# Patient Record
Sex: Female | Born: 1935 | Race: White | Hispanic: No | Smoking: Never smoker
Health system: Southern US, Community
[De-identification: ages and names within clinical notes are randomized; demographics above are authoritative.]

## PROBLEM LIST (undated history)

## (undated) DIAGNOSIS — Z973 Presence of spectacles and contact lenses: Secondary | ICD-10-CM

## (undated) DIAGNOSIS — I471 Supraventricular tachycardia, unspecified: Secondary | ICD-10-CM

## (undated) DIAGNOSIS — R0602 Shortness of breath: Secondary | ICD-10-CM

## (undated) DIAGNOSIS — G4700 Insomnia, unspecified: Secondary | ICD-10-CM

## (undated) DIAGNOSIS — Z972 Presence of dental prosthetic device (complete) (partial): Secondary | ICD-10-CM

## (undated) DIAGNOSIS — R430 Anosmia: Secondary | ICD-10-CM

## (undated) DIAGNOSIS — J309 Allergic rhinitis, unspecified: Secondary | ICD-10-CM

## (undated) DIAGNOSIS — K08109 Complete loss of teeth, unspecified cause, unspecified class: Secondary | ICD-10-CM

## (undated) DIAGNOSIS — M810 Age-related osteoporosis without current pathological fracture: Secondary | ICD-10-CM

## (undated) DIAGNOSIS — R569 Unspecified convulsions: Secondary | ICD-10-CM

## (undated) DIAGNOSIS — I8390 Asymptomatic varicose veins of unspecified lower extremity: Secondary | ICD-10-CM

## (undated) DIAGNOSIS — I495 Sick sinus syndrome: Secondary | ICD-10-CM

## (undated) DIAGNOSIS — Z8719 Personal history of other diseases of the digestive system: Secondary | ICD-10-CM

## (undated) DIAGNOSIS — M1990 Unspecified osteoarthritis, unspecified site: Secondary | ICD-10-CM

## (undated) HISTORY — PX: COLONOSCOPY: SHX174

---
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
## 1987-10-10 HISTORY — PX: COLON RESECTION: SHX5231

## 2013-10-09 HISTORY — PX: ESOPHAGEAL DILATION: SHX303

## 2014-05-01 ENCOUNTER — Other Ambulatory Visit: Payer: Self-pay | Admitting: Orthopedic Surgery

## 2014-05-01 ENCOUNTER — Encounter (HOSPITAL_BASED_OUTPATIENT_CLINIC_OR_DEPARTMENT_OTHER): Payer: Self-pay | Admitting: *Deleted

## 2014-05-01 NOTE — Progress Notes (Signed)
Very independent lady-no cardiac or resp problems-did have an echo 2014 dr Daryel November for some sob-was normal-no meds-

## 2014-05-05 ENCOUNTER — Ambulatory Visit (HOSPITAL_BASED_OUTPATIENT_CLINIC_OR_DEPARTMENT_OTHER): Admit: 2014-05-05 | Payer: Medicare Other | Admitting: Orthopedic Surgery

## 2014-05-05 HISTORY — DX: Complete loss of teeth, unspecified cause, unspecified class: K08.109

## 2014-05-05 HISTORY — DX: Presence of spectacles and contact lenses: Z97.3

## 2014-05-05 HISTORY — DX: Presence of dental prosthetic device (complete) (partial): Z97.2

## 2014-05-05 HISTORY — DX: Personal history of other diseases of the digestive system: Z87.19

## 2014-05-05 HISTORY — DX: Unspecified osteoarthritis, unspecified site: M19.90

## 2014-05-05 HISTORY — DX: Insomnia, unspecified: G47.00

## 2014-05-05 SURGERY — EXCISION METACARPAL MASS
Anesthesia: General | Site: Thumb | Laterality: Left

## 2017-02-12 ENCOUNTER — Emergency Department (HOSPITAL_COMMUNITY)
Admission: EM | Admit: 2017-02-12 | Discharge: 2017-02-13 | Disposition: A | Discharge status: Home / Self Care | Payer: Medicare Other | Attending: Emergency Medicine | Admitting: Emergency Medicine

## 2017-02-12 ENCOUNTER — Encounter (HOSPITAL_COMMUNITY): Payer: Self-pay

## 2017-02-12 ENCOUNTER — Other Ambulatory Visit: Payer: Self-pay

## 2017-02-12 ENCOUNTER — Emergency Department (HOSPITAL_COMMUNITY): Payer: Medicare Other

## 2017-02-12 DIAGNOSIS — J069 Acute upper respiratory infection, unspecified: Secondary | ICD-10-CM | POA: Diagnosis not present

## 2017-02-12 DIAGNOSIS — R1031 Right lower quadrant pain: Secondary | ICD-10-CM | POA: Diagnosis not present

## 2017-02-12 DIAGNOSIS — Z79899 Other long term (current) drug therapy: Secondary | ICD-10-CM | POA: Insufficient documentation

## 2017-02-12 HISTORY — DX: Supraventricular tachycardia, unspecified: I47.10

## 2017-02-12 HISTORY — DX: Allergic rhinitis, unspecified: J30.9

## 2017-02-12 HISTORY — DX: Supraventricular tachycardia: I47.1

## 2017-02-12 HISTORY — DX: Anosmia: R43.0

## 2017-02-12 HISTORY — DX: Age-related osteoporosis without current pathological fracture: M81.0

## 2017-02-12 HISTORY — DX: Asymptomatic varicose veins of unspecified lower extremity: I83.90

## 2017-02-12 HISTORY — DX: Shortness of breath: R06.02

## 2017-02-12 LAB — CBC
HCT: 37 % (ref 36.0–46.0)
Hemoglobin: 12.4 g/dL (ref 12.0–15.0)
MCH: 30.2 pg (ref 26.0–34.0)
MCHC: 33.5 g/dL (ref 30.0–36.0)
MCV: 90 fL (ref 78.0–100.0)
Platelets: 155 10*3/uL (ref 150–400)
RBC: 4.11 MIL/uL (ref 3.87–5.11)
RDW: 12.7 % (ref 11.5–15.5)
WBC: 6 10*3/uL (ref 4.0–10.5)

## 2017-02-12 LAB — COMPREHENSIVE METABOLIC PANEL
ALT: 13 U/L — ABNORMAL LOW (ref 14–54)
AST: 20 U/L (ref 15–41)
Albumin: 3.6 g/dL (ref 3.5–5.0)
Alkaline Phosphatase: 63 U/L (ref 38–126)
Anion gap: 9 (ref 5–15)
BUN: 11 mg/dL (ref 6–20)
CO2: 27 mmol/L (ref 22–32)
Calcium: 9.3 mg/dL (ref 8.9–10.3)
Chloride: 102 mmol/L (ref 101–111)
Creatinine, Ser: 0.73 mg/dL (ref 0.44–1.00)
GFR calc Af Amer: >60 mL/min (ref >60)
GFR calc non Af Amer: >60 mL/min (ref >60)
Glucose, Bld: 108 mg/dL — ABNORMAL HIGH (ref 65–99)
Potassium: 3.6 mmol/L (ref 3.5–5.1)
Sodium: 138 mmol/L (ref 135–145)
Total Bilirubin: 0.9 mg/dL (ref 0.3–1.2)
Total Protein: 6.4 g/dL — ABNORMAL LOW (ref 6.5–8.1)

## 2017-02-12 LAB — URINALYSIS, ROUTINE W REFLEX MICROSCOPIC
Bacteria, UA: NONE SEEN
Bilirubin Urine: NEGATIVE
Glucose, UA: NEGATIVE mg/dL
Ketones, ur: 20 mg/dL — AB
Leukocytes, UA: NEGATIVE
Nitrite: NEGATIVE
Protein, ur: NEGATIVE mg/dL
Specific Gravity, Urine: 1.015 (ref 1.005–1.030)
pH: 6 (ref 5.0–8.0)

## 2017-02-12 LAB — LIPASE, BLOOD: Lipase: 17 U/L (ref 11–51)

## 2017-02-12 NOTE — ED Triage Notes (Signed)
Pt reports RLQ abdominal pain intermittently when coughing and moving around. She also reports SOB, diagnosed with Bronchitis on Wednesday in [place], [place] and is taking Levaquin. RR unlabored, talking in clear complete sentences.

## 2017-02-13 ENCOUNTER — Emergency Department (HOSPITAL_COMMUNITY): Payer: Medicare Other

## 2017-02-13 ENCOUNTER — Encounter (HOSPITAL_COMMUNITY): Payer: Self-pay

## 2017-02-13 DIAGNOSIS — R1031 Right lower quadrant pain: Secondary | ICD-10-CM | POA: Diagnosis not present

## 2017-02-13 MED ORDER — IOPAMIDOL (ISOVUE-300) INJECTION 61%
INTRAVENOUS | Status: AC
  Administered 2017-02-13: 100 mL
  Filled 2017-02-13: qty 100

## 2017-02-13 MED ORDER — SODIUM CHLORIDE 0.9 % IV BOLUS (SEPSIS)
1000.0000 mL | Freq: Once | INTRAVENOUS | Status: AC
  Administered 2017-02-13: 1000 mL via INTRAVENOUS

## 2017-02-13 MED ORDER — ONDANSETRON HCL 4 MG/2ML IJ SOLN
4.0000 mg | Freq: Once | INTRAMUSCULAR | Status: AC
  Administered 2017-02-13: 4 mg via INTRAVENOUS
  Filled 2017-02-13: qty 2

## 2017-02-13 MED ORDER — MORPHINE SULFATE (PF) 4 MG/ML IV SOLN
4.0000 mg | Freq: Once | INTRAVENOUS | Status: AC
  Administered 2017-02-13: 4 mg via INTRAVENOUS
  Filled 2017-02-13: qty 1

## 2017-02-13 NOTE — Discharge Instructions (Signed)
He was seen today for upper respiratory infections and abdominal pain. Your workup is reassuring. Your upper respiratory symptoms are likely related to a viral illness. Your CT scan of the abdomen is negative. Make sure to stay hydrated at home. Follow-up with your primary physician for recheck.

## 2017-02-13 NOTE — ED Provider Notes (Signed)
[facility]-EMERGENCY DEPT Provider Note   CSN: [id number] Arrival date & time: 02/12/17  1730  By signing my name below, I, Elder Negus, attest that this documentation has been prepared under the direction and in the presence of No att. providers found. Electronically Signed: Elder Negus, Scribe. 02/13/17. 11:00 PM.   History   Chief Complaint Chief Complaint  Patient presents with  . Shortness of Breath  . Abdominal Pain    HPI Denise Espinoza is a 83 y.o. female with history of arthritis and SVT on metoprolol who presents to the ED for evaluation of multiple complaints. The daughter is at interview who assists with history. She states that for the last week the patient has been generally weak. The daughter returned home this evening and concerned for the general state of the patient. She is having decreased activity due to weakness. The patient is also reporting intermittent RLQ pain for "months". She denies any abdominal pain now while at rest. However at times it is "a 10". Worse when palpating or moving. Not worse following meals. No fevers, nausea, vomiting, or diarrhea. Difficulty stooling in the last week. Patient reports also upper respiratory symptoms including productive cough. No fevers. Was recently diagnosed with bronchitis and sinusitis. She is on medications for these. Reports decreased by mouth intake.  The history is provided by the patient.  Abdominal Pain   This is a chronic problem. Episode onset: months. The problem occurs hourly. The problem has been resolved. The pain is associated with an unknown factor. The pain is located in the RLQ. The patient is experiencing no pain. Associated symptoms include constipation. Pertinent negatives include fever, diarrhea, nausea and vomiting.    Past Medical History:  Diagnosis Date  . Allergic rhinitis   . Anosmia   . Arthritis   . Full dentures   . History of diverticulitis   . Insomnia   . Insomnia   . Senile  osteoporosis   . SOB (shortness of breath)   . SVT (supraventricular tachycardia) (HCC)   . Varicose vein of leg    left  . Wears glasses     There are no active problems to display for this patient.   Past Surgical History:  Procedure Laterality Date  . COLON RESECTION  1989   diverticulitis  . COLONOSCOPY    . ESOPHAGEAL DILATION  2015   endoscopy    OB History    No data available       Home Medications    Prior to Admission medications   Medication Sig Start Date End Date Taking? Authorizing Provider  calcium-vitamin D (OSCAL WITH D) 500-200 MG-UNIT per tablet Take 1 tablet by mouth 2 (two) times daily.    Yes [provider]  denosumab (PROLIA) 60 MG/ML SOLN injection Inject 60 mg into the skin every 6 (six) months. Administer in upper arm, thigh, or abdomen   Yes [provider]  ibuprofen (ADVIL,MOTRIN) 200 MG tablet Take 200 mg by mouth every 6 (six) hours as needed for fever or mild pain.   Yes [provider]  levofloxacin (LEVAQUIN) 500 MG tablet Take 500 mg by mouth daily.   Yes [provider]  metoprolol succinate (TOPROL-XL) 25 MG 24 hr tablet Take 12.5 mg by mouth daily.   Yes [provider]  Pseudoeph-Bromphen-DM (BROMAXEFED DM RF PO) Take 5 mLs by mouth every 6 (six) hours as needed (for cough).   Yes [provider]  rosuvastatin (CRESTOR) 5 MG tablet Take 5  mg by mouth daily.   Yes [provider]  temazepam (RESTORIL) 15 MG capsule Take 15 mg by mouth at bedtime as needed for sleep.   Yes [provider]  traMADol (ULTRAM) 50 MG tablet Take 50 mg by mouth every 6 (six) hours as needed for moderate pain.    Yes [provider]  traZODone (DESYREL) 50 MG tablet Take 50 mg by mouth at bedtime as needed for sleep.    Yes [provider]    Family History No family history on file.  Social History Social History  Substance Use Topics  . Smoking status: Never  Smoker  . Smokeless tobacco: Never Used  . Alcohol use Yes     Comment: rare     Allergies   Codeine phosphate [codeine] and Penicillins   Review of Systems Review of Systems  Constitutional: Negative for fever.       Generalized weakness  Respiratory: Positive for cough. Negative for shortness of breath.   Cardiovascular: Negative for chest pain.  Gastrointestinal: Positive for abdominal pain and constipation. Negative for diarrhea, nausea and vomiting.  All other systems reviewed and are negative.    Physical Exam Updated Vital Signs BP 136/71 (BP Location: Right Arm)   Pulse 87   Temp 99.6 F (37.6 C) (Oral)   Resp 20   SpO2 98%   Physical Exam  Constitutional: She is oriented to person, place, and time. She appears well-nourished. No distress.  Elderly, no acute distress  HENT:  Head: Normocephalic and atraumatic.  Mucous membranes moist  Cardiovascular: Normal rate, regular rhythm and normal heart sounds.   Pulmonary/Chest: Effort normal. No respiratory distress. She has no wheezes.  Occasional cough, otherwise clear breath sounds, no wheezing  Abdominal: Soft. Bowel sounds are normal. There is tenderness. There is no guarding.  Right lower quadrant tenderness palpation, no rebound or guarding  Neurological: She is alert and oriented to person, place, and time.  Skin: Skin is warm and dry.  Psychiatric: She has a normal mood and affect.  Nursing note and vitals reviewed.    ED Treatments / Results  Labs (all labs ordered are listed, but only abnormal results are displayed) Labs Reviewed  COMPREHENSIVE METABOLIC PANEL - Abnormal; Notable for the following:       Result Value   Glucose, Bld 108 (*)    Total Protein 6.4 (*)    ALT 13 (*)    All other components within normal limits  URINALYSIS, ROUTINE W REFLEX MICROSCOPIC - Abnormal; Notable for the following:    Hgb urine dipstick SMALL (*)    Ketones, ur 20 (*)    Squamous Epithelial / LPF 0-5 (*)      All other components within normal limits  LIPASE, BLOOD  CBC    EKG  EKG Interpretation  Date/Time:  Tuesday Feb 13 2017 00:31:35 EDT Ventricular Rate:  85 PR Interval:    QRS Duration: 95 QT Interval:  355 QTC Calculation: 423 R Axis:   -3 Text Interpretation:  Sinus rhythm Atrial premature complex Confirmed by Ross Marcus ([id number]) on 02/13/2017 1:44:39 AM Also confirmed by Ross Marcus ([id number]), editor Elita Quick [phone])  on 02/13/2017 7:38:17 AM       Radiology Dg Chest 2 View  Result Date: 02/12/2017 CLINICAL DATA:  Productive cough and shortness of breath. EXAM: CHEST  2 VIEW COMPARISON:  None. FINDINGS: Lungs appear hyperexpanded. No focal airspace consolidation. No pulmonary edema or pleural effusion. The cardiopericardial silhouette is within  normal limits for size. The visualized bony structures of the thorax are intact. IMPRESSION: Hyperinflation without acute cardiopulmonary findings. Electronically Signed   By: Kennith Center M.D.   On: 02/12/2017 20:37   Ct Abdomen Pelvis W Contrast  Result Date: 02/13/2017 CLINICAL DATA:  83 y/o F; right lower quadrant pain for several days. EXAM: CT ABDOMEN AND PELVIS WITH CONTRAST TECHNIQUE: Multidetector CT imaging of the abdomen and pelvis was performed using the standard protocol following bolus administration of intravenous contrast. CONTRAST:  <See Chart> ISOVUE-300 IOPAMIDOL (ISOVUE-300) INJECTION 61% COMPARISON:  None. FINDINGS: Lower chest: No acute abnormality. Hepatobiliary: 16 mm fluid attenuating focus within segment 3 (series 3, image 23) and 10 mm fluid attenuating focus in segment 5 (series 3, image 18).No other focal liver lesion identified. Normal gallbladder. No intra or extrahepatic biliary ductal dilatation. Pancreas: Unremarkable. No pancreatic ductal dilatation or surrounding inflammatory changes. Spleen: Normal in size without focal abnormality. Adrenals/Urinary Tract: Adrenal glands are unremarkable.  Kidneys are normal, without renal calculi, focal lesion, or hydronephrosis. Bladder is unremarkable. Stomach/Bowel: Stomach is within normal limits. Appendix not identified, no secondary signs of acute appendicitis. No evidence of bowel wall thickening, distention, or inflammatory changes. Widely patent colorectal anastomosis without evidence for obstruction. Vascular/Lymphatic: Aortic atherosclerosis. No enlarged abdominal or pelvic lymph nodes. Reproductive: Status post hysterectomy. No adnexal masses. Other: Chronic postsurgical changes within the lower ventral abdominal wall. Musculoskeletal: Mild lumbar dextrocurvature. Grade 1 L3-4 anterolisthesis. Multilevel lumbar degenerative changes with moderate to severe canal stenosis at the L2-3, L3-4 and possibly L4-5 levels. IMPRESSION: 1. No acute process identified as explanation for abdominal pain. 2. Fluid attenuating lesions within the liver probably representing cysts. 3. Advanced lumbar spine degenerative changes. 4. Calcific aortic atherosclerosis. Electronically Signed   By: Mitzi Hansen M.D.   On: 02/13/2017 02:15    Procedures Procedures (including critical care time)  Medications Ordered in ED Medications  sodium chloride 0.9 % bolus 1,000 mL (0 mLs Intravenous Stopped 02/13/17 0401)  morphine 4 MG/ML injection 4 mg (4 mg Intravenous Given 02/13/17 0106)  ondansetron (ZOFRAN) injection 4 mg (4 mg Intravenous Given 02/13/17 0106)  iopamidol (ISOVUE-300) 61 % injection (100 mLs  Contrast Given 02/13/17 0138)     Initial Impression / Assessment and Plan / ED Course  I have reviewed the triage vital signs and the nursing notes.  Pertinent labs & imaging results that were available during my care of the patient were reviewed by me and considered in my medical decision making (see chart for details).     Patient presents with generalized weakness, recent upper respiratory symptoms, acute on chronic abdominal pain. Daughter's concern  over just the general state of health of her mother given that she's had decreased by mouth intake. She is overall nontoxic-appearing. Vital signs are reassuring. She has mild tenderness on exam. No signs or symptoms of obstruction. Lab work obtained and largely reassuring including abdominal lab work.  EKG is nonischemic. Chest x-ray is largely reassuring. CT scan of the abdomen obtained and shows no evidence of pathology that would create her right lower quadrant pain. Patient was given fluids. On recheck she states that she feels somewhat better. She is able to tolerate orals. Suspect her symptoms may be multifactorial. She needs close follow-up with her primary physician.  After history, exam, and medical workup I feel the patient has been appropriately medically screened and is safe for discharge home. Pertinent diagnoses were discussed with the patient. Patient was given return precautions.  Final Clinical Impressions(s) / ED Diagnoses   Final diagnoses:  Right lower quadrant abdominal pain  Upper respiratory tract infection, unspecified type    New Prescriptions Discharge Medication List as of 02/13/2017  4:03 AM     I personally performed the services described in this documentation, which was scribed in my presence. The recorded information has been reviewed and is accurate.    Shon Baton, MD 02/13/17 2303

## 2017-02-13 NOTE — ED Notes (Signed)
EDP at bedside  

## 2017-02-13 NOTE — ED Notes (Signed)
Pt ambulatory to restroom with stand by assist, steady gait. Pt given sprite and crackers per Dr. Wilkie Aye

## 2018-10-09 DIAGNOSIS — R569 Unspecified convulsions: Secondary | ICD-10-CM

## 2018-10-09 HISTORY — DX: Unspecified convulsions: R56.9

## 2019-02-04 ENCOUNTER — Inpatient Hospital Stay (HOSPITAL_COMMUNITY): Payer: Medicare Other

## 2019-02-04 ENCOUNTER — Other Ambulatory Visit: Payer: Self-pay

## 2019-02-04 ENCOUNTER — Emergency Department (HOSPITAL_COMMUNITY): Payer: Medicare Other

## 2019-02-04 ENCOUNTER — Inpatient Hospital Stay (HOSPITAL_COMMUNITY)
Admission: EM | Admit: 2019-02-04 | Discharge: 2019-02-15 | DRG: 472 | Disposition: A | Discharge status: Rehabilitation Facility | Payer: Medicare Other | Attending: Neurosurgery | Admitting: Neurosurgery

## 2019-02-04 ENCOUNTER — Encounter (HOSPITAL_COMMUNITY): Payer: Self-pay

## 2019-02-04 DIAGNOSIS — M19041 Primary osteoarthritis, right hand: Secondary | ICD-10-CM | POA: Diagnosis not present

## 2019-02-04 DIAGNOSIS — G4700 Insomnia, unspecified: Secondary | ICD-10-CM | POA: Diagnosis not present

## 2019-02-04 DIAGNOSIS — Z79899 Other long term (current) drug therapy: Secondary | ICD-10-CM

## 2019-02-04 DIAGNOSIS — M9689 Other intraoperative and postprocedural complications and disorders of the musculoskeletal system: Secondary | ICD-10-CM | POA: Diagnosis not present

## 2019-02-04 DIAGNOSIS — S12110K Anterior displaced Type II dens fracture, subsequent encounter for fracture with nonunion: Principal | ICD-10-CM

## 2019-02-04 DIAGNOSIS — Z9049 Acquired absence of other specified parts of digestive tract: Secondary | ICD-10-CM

## 2019-02-04 DIAGNOSIS — S14129S Central cord syndrome at unspecified level of cervical spinal cord, sequela: Secondary | ICD-10-CM | POA: Diagnosis not present

## 2019-02-04 DIAGNOSIS — F419 Anxiety disorder, unspecified: Secondary | ICD-10-CM | POA: Diagnosis present

## 2019-02-04 DIAGNOSIS — K5900 Constipation, unspecified: Secondary | ICD-10-CM | POA: Diagnosis present

## 2019-02-04 DIAGNOSIS — I951 Orthostatic hypotension: Secondary | ICD-10-CM | POA: Diagnosis not present

## 2019-02-04 DIAGNOSIS — G992 Myelopathy in diseases classified elsewhere: Secondary | ICD-10-CM | POA: Diagnosis not present

## 2019-02-04 DIAGNOSIS — M069 Rheumatoid arthritis, unspecified: Secondary | ICD-10-CM | POA: Diagnosis present

## 2019-02-04 DIAGNOSIS — Z880 Allergy status to penicillin: Secondary | ICD-10-CM

## 2019-02-04 DIAGNOSIS — E785 Hyperlipidemia, unspecified: Secondary | ICD-10-CM | POA: Diagnosis not present

## 2019-02-04 DIAGNOSIS — G629 Polyneuropathy, unspecified: Secondary | ICD-10-CM | POA: Diagnosis present

## 2019-02-04 DIAGNOSIS — S14129D Central cord syndrome at unspecified level of cervical spinal cord, subsequent encounter: Secondary | ICD-10-CM | POA: Diagnosis not present

## 2019-02-04 DIAGNOSIS — M7989 Other specified soft tissue disorders: Secondary | ICD-10-CM | POA: Diagnosis not present

## 2019-02-04 DIAGNOSIS — Y838 Other surgical procedures as the cause of abnormal reaction of the patient, or of later complication, without mention of misadventure at the time of the procedure: Secondary | ICD-10-CM | POA: Diagnosis not present

## 2019-02-04 DIAGNOSIS — Z79891 Long term (current) use of opiate analgesic: Secondary | ICD-10-CM | POA: Diagnosis not present

## 2019-02-04 DIAGNOSIS — D62 Acute posthemorrhagic anemia: Secondary | ICD-10-CM | POA: Diagnosis not present

## 2019-02-04 DIAGNOSIS — M810 Age-related osteoporosis without current pathological fracture: Secondary | ICD-10-CM | POA: Diagnosis present

## 2019-02-04 DIAGNOSIS — G9520 Unspecified cord compression: Secondary | ICD-10-CM

## 2019-02-04 DIAGNOSIS — Z1159 Encounter for screening for other viral diseases: Secondary | ICD-10-CM | POA: Diagnosis not present

## 2019-02-04 DIAGNOSIS — S12190A Other displaced fracture of second cervical vertebra, initial encounter for closed fracture: Secondary | ICD-10-CM

## 2019-02-04 DIAGNOSIS — J309 Allergic rhinitis, unspecified: Secondary | ICD-10-CM | POA: Diagnosis present

## 2019-02-04 DIAGNOSIS — M4802 Spinal stenosis, cervical region: Secondary | ICD-10-CM | POA: Diagnosis present

## 2019-02-04 DIAGNOSIS — Z4789 Encounter for other orthopedic aftercare: Secondary | ICD-10-CM | POA: Diagnosis present

## 2019-02-04 DIAGNOSIS — I471 Supraventricular tachycardia: Secondary | ICD-10-CM | POA: Diagnosis present

## 2019-02-04 DIAGNOSIS — Z885 Allergy status to narcotic agent status: Secondary | ICD-10-CM

## 2019-02-04 DIAGNOSIS — I10 Essential (primary) hypertension: Secondary | ICD-10-CM | POA: Diagnosis not present

## 2019-02-04 DIAGNOSIS — Z419 Encounter for procedure for purposes other than remedying health state, unspecified: Secondary | ICD-10-CM

## 2019-02-04 DIAGNOSIS — Z8679 Personal history of other diseases of the circulatory system: Secondary | ICD-10-CM | POA: Diagnosis not present

## 2019-02-04 DIAGNOSIS — N319 Neuromuscular dysfunction of bladder, unspecified: Secondary | ICD-10-CM | POA: Diagnosis present

## 2019-02-04 DIAGNOSIS — M19042 Primary osteoarthritis, left hand: Secondary | ICD-10-CM | POA: Diagnosis not present

## 2019-02-04 DIAGNOSIS — N390 Urinary tract infection, site not specified: Secondary | ICD-10-CM | POA: Diagnosis not present

## 2019-02-04 DIAGNOSIS — K592 Neurogenic bowel, not elsewhere classified: Secondary | ICD-10-CM | POA: Diagnosis present

## 2019-02-04 LAB — CBC WITH DIFFERENTIAL/PLATELET
Abs Immature Granulocytes: 0.02 10*3/uL (ref 0.00–0.07)
Basophils Absolute: 0 10*3/uL (ref 0.0–0.1)
Basophils Relative: 0 %
Eosinophils Absolute: 0.1 10*3/uL (ref 0.0–0.5)
Eosinophils Relative: 2 %
HCT: 38.2 % (ref 36.0–46.0)
Hemoglobin: 12.3 g/dL (ref 12.0–15.0)
Immature Granulocytes: 0 %
Lymphocytes Relative: 19 %
Lymphs Abs: 1.2 10*3/uL (ref 0.7–4.0)
MCH: 30.3 pg (ref 26.0–34.0)
MCHC: 32.2 g/dL (ref 30.0–36.0)
MCV: 94.1 fL (ref 80.0–100.0)
Monocytes Absolute: 0.3 10*3/uL (ref 0.1–1.0)
Monocytes Relative: 5 %
Neutro Abs: 4.3 10*3/uL (ref 1.7–7.7)
Neutrophils Relative %: 74 %
Platelets: 196 10*3/uL (ref 150–400)
RBC: 4.06 MIL/uL (ref 3.87–5.11)
RDW: 12.8 % (ref 11.5–15.5)
WBC: 5.9 10*3/uL (ref 4.0–10.5)
nRBC: 0 % (ref 0.0–0.2)

## 2019-02-04 LAB — BASIC METABOLIC PANEL
Anion gap: 9 (ref 5–15)
BUN: 20 mg/dL (ref 8–23)
CO2: 28 mmol/L (ref 22–32)
Calcium: 9 mg/dL (ref 8.9–10.3)
Chloride: 102 mmol/L (ref 98–111)
Creatinine, Ser: 0.61 mg/dL (ref 0.44–1.00)
GFR calc Af Amer: >60 mL/min (ref >60)
GFR calc non Af Amer: >60 mL/min (ref >60)
Glucose, Bld: 98 mg/dL (ref 70–99)
Potassium: 4.3 mmol/L (ref 3.5–5.1)
Sodium: 139 mmol/L (ref 135–145)

## 2019-02-04 LAB — I-STAT CREATININE, ED: Creatinine, Ser: 0.7 mg/dL (ref 0.44–1.00)

## 2019-02-04 MED ORDER — ACETAMINOPHEN 650 MG RE SUPP: 650.0000 mg | Freq: Four times a day (QID) | RECTAL | Status: DC | PRN

## 2019-02-04 MED ORDER — ZOLPIDEM TARTRATE 5 MG PO TABS
5.0000 mg | ORAL_TABLET | Freq: Every evening | ORAL | Status: DC | PRN
  Administered 2019-02-10 – 2019-02-13 (×3): 5 mg via ORAL
  Filled 2019-02-04 (×3): qty 1

## 2019-02-04 MED ORDER — SODIUM CHLORIDE 0.9% FLUSH
3.0000 mL | Freq: Two times a day (BID) | INTRAVENOUS | Status: DC
Start: 2019-02-04 — End: 2019-02-15
  Administered 2019-02-05 – 2019-02-14 (×15): 3 mL via INTRAVENOUS

## 2019-02-04 MED ORDER — FLEET ENEMA 7-19 GM/118ML RE ENEM: 1.0000 | ENEMA | Freq: Once | RECTAL | Status: DC | PRN

## 2019-02-04 MED ORDER — GADOBUTROL 1 MMOL/ML IV SOLN
6.0000 mL | Freq: Once | INTRAVENOUS | Status: AC | PRN
Start: 2019-02-04 — End: 2019-02-04
  Administered 2019-02-04: 15:00:00 6 mL via INTRAVENOUS

## 2019-02-04 MED ORDER — SODIUM CHLORIDE 0.9 % IV SOLN: 250.0000 mL | INTRAVENOUS | Status: DC | PRN

## 2019-02-04 MED ORDER — SODIUM CHLORIDE 0.9% FLUSH
3.0000 mL | INTRAVENOUS | Status: DC | PRN
  Administered 2019-02-10: 22:00:00 3 mL via INTRAVENOUS
  Filled 2019-02-04: qty 3

## 2019-02-04 MED ORDER — DOCUSATE SODIUM 100 MG PO CAPS
100.0000 mg | ORAL_CAPSULE | Freq: Two times a day (BID) | ORAL | Status: DC
  Administered 2019-02-04 – 2019-02-05 (×3): 100 mg via ORAL
  Filled 2019-02-04 (×3): qty 1

## 2019-02-04 MED ORDER — ACETAMINOPHEN 325 MG PO TABS: 650.0000 mg | ORAL_TABLET | Freq: Four times a day (QID) | ORAL | Status: DC | PRN

## 2019-02-04 MED ORDER — ONDANSETRON HCL 4 MG PO TABS
4.0000 mg | ORAL_TABLET | Freq: Four times a day (QID) | ORAL | Status: DC | PRN
Start: 2019-02-04 — End: 2019-02-06

## 2019-02-04 MED ORDER — BISACODYL 5 MG PO TBEC: 5.0000 mg | DELAYED_RELEASE_TABLET | Freq: Every day | ORAL | Status: DC | PRN

## 2019-02-04 MED ORDER — SENNOSIDES-DOCUSATE SODIUM 8.6-50 MG PO TABS
1.0000 | ORAL_TABLET | Freq: Every evening | ORAL | Status: DC | PRN
  Filled 2019-02-04: qty 1

## 2019-02-04 MED ORDER — IOHEXOL 350 MG/ML SOLN
100.0000 mL | Freq: Once | INTRAVENOUS | Status: AC | PRN
  Administered 2019-02-04: 20:00:00 75 mL via INTRAVENOUS

## 2019-02-04 MED ORDER — CALCIUM CARBONATE-VITAMIN D 500-200 MG-UNIT PO TABS
1.0000 | ORAL_TABLET | Freq: Two times a day (BID) | ORAL | Status: DC
  Administered 2019-02-04 – 2019-02-15 (×20): 1 via ORAL
  Filled 2019-02-04 (×22): qty 1

## 2019-02-04 MED ORDER — HYDROCODONE-ACETAMINOPHEN 5-325 MG PO TABS
1.0000 | ORAL_TABLET | ORAL | Status: DC | PRN
  Administered 2019-02-04: 1 via ORAL
  Administered 2019-02-05: 23:00:00 2 via ORAL
  Filled 2019-02-04: qty 1
  Filled 2019-02-04: qty 2

## 2019-02-04 MED ORDER — METOPROLOL SUCCINATE ER 25 MG PO TB24
12.5000 mg | ORAL_TABLET | Freq: Every day | ORAL | Status: DC
  Administered 2019-02-05 – 2019-02-15 (×11): 12.5 mg via ORAL
  Filled 2019-02-04 (×11): qty 1

## 2019-02-04 MED ORDER — TRAZODONE HCL 50 MG PO TABS
50.0000 mg | ORAL_TABLET | Freq: Every evening | ORAL | Status: DC | PRN
  Administered 2019-02-04 – 2019-02-15 (×6): 50 mg via ORAL
  Filled 2019-02-04 (×7): qty 1

## 2019-02-04 MED ORDER — SODIUM CHLORIDE 0.9 % IV SOLN
INTRAVENOUS | Status: DC
  Administered 2019-02-04 – 2019-02-15 (×15): via INTRAVENOUS

## 2019-02-04 MED ORDER — ROSUVASTATIN CALCIUM 5 MG PO TABS
5.0000 mg | ORAL_TABLET | Freq: Every day | ORAL | Status: DC
  Administered 2019-02-04 – 2019-02-14 (×10): 5 mg via ORAL
  Filled 2019-02-04 (×13): qty 1

## 2019-02-04 MED ORDER — TRAMADOL HCL 50 MG PO TABS
50.0000 mg | ORAL_TABLET | Freq: Four times a day (QID) | ORAL | Status: DC | PRN
  Administered 2019-02-07 – 2019-02-09 (×3): 50 mg via ORAL
  Filled 2019-02-04 (×3): qty 1

## 2019-02-04 MED ORDER — ONDANSETRON HCL 4 MG/2ML IJ SOLN: 4.0000 mg | Freq: Four times a day (QID) | INTRAMUSCULAR | Status: DC | PRN

## 2019-02-04 NOTE — H&P (Addendum)
Chief Complaint   Chief Complaint  Patient presents with   Numbness    HPI   HPI: Denise Espinoza is a 85 y.o. female with history of hypertension, hyperlipidemia, insomnia and osteoporosis who presented to [facility] emergency room for evaluation regarding bilateral upper extremity numbness and weakness.  Three weeks ago her legs gave out and she fell striking the left side of her head.  Since the fall she has noticed bilateral upper extremity numbness and tingling as well as weakness, right worse than left.  Prior to the fall she had a several month history of gait instability  Where she describes her legs giving out intermittently resulting in falls.  Since her most recent fall 3 weeks ago her bilateral lower extremity symptoms resolved.   Weakness and numbness and tingling in her bilateral upper extremities is most prominent in her hands.  She reports difficulties with any fine motor skills and essentially feels as though her hands are "flopping around ".  She feels as though she is not looking at her hands, she has no control over them.     She is right-hand dominant and the inability to use her right upper extremity causes significant difficulties with ADLs.   She uses her left arm to raise her right arm above her head.  She denies any pain including neck pain, low back pain or extremity pain.  She denies bowel or bladder dysfunction.  She is not on any blood thinning agents.  No prior history of CVA /MI.  Patient Active Problem List   Diagnosis Date Noted   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    PMH: Past Medical History:  Diagnosis Date   Allergic rhinitis    Anosmia    Arthritis    Full dentures    History of diverticulitis    Insomnia    Insomnia    Senile osteoporosis    SOB (shortness of breath)    SVT (supraventricular tachycardia) (HCC)    Varicose vein of leg    left   Wears glasses     PSH: Past Surgical History:  Procedure  Laterality Date   COLON RESECTION  1989   diverticulitis   COLONOSCOPY     ESOPHAGEAL DILATION  2015   endoscopy    Medications Prior to Admission  Medication Sig Dispense Refill Last Dose   calcium-vitamin D (OSCAL WITH D) 500-200 MG-UNIT per tablet Take 1 tablet by mouth 2 (two) times daily.    02/03/2019 at 2200   denosumab (PROLIA) 60 MG/ML SOLN injection Inject 60 mg into the skin every 6 (six) months. Administer in upper arm, thigh, or abdomen   january 2018   metoprolol succinate (TOPROL-XL) 25 MG 24 hr tablet Take 12.5 mg by mouth daily.   02/03/2019 at 2200   rosuvastatin (CRESTOR) 5 MG tablet Take 5 mg by mouth daily.   02/03/2019 at 2200   traMADol (ULTRAM) 50 MG tablet Take 50 mg by mouth every 6 (six) hours as needed for moderate pain.    02/04/2019 at 0700   traZODone (DESYREL) 50 MG tablet Take 50 mg by mouth at bedtime as needed for sleep.    02/03/2019 at 2200    SH: Social History   Tobacco Use   Smoking status: Never Smoker   Smokeless tobacco: Never Used  Substance Use Topics   Alcohol use: Yes    Comment: rare   Drug use: No    MEDS: Prior to Admission  medications   Medication Sig Start Date End Date Taking? Authorizing Provider  calcium-vitamin D (OSCAL WITH D) 500-200 MG-UNIT per tablet Take 1 tablet by mouth 2 (two) times daily.    Yes [provider]  denosumab (PROLIA) 60 MG/ML SOLN injection Inject 60 mg into the skin every 6 (six) months. Administer in upper arm, thigh, or abdomen   Yes [provider]  metoprolol succinate (TOPROL-XL) 25 MG 24 hr tablet Take 12.5 mg by mouth daily.   Yes [provider]  rosuvastatin (CRESTOR) 5 MG tablet Take 5 mg by mouth daily.   Yes [provider]  traMADol (ULTRAM) 50 MG tablet Take 50 mg by mouth every 6 (six) hours as needed for moderate pain.    Yes [provider]  traZODone (DESYREL) 50 MG tablet Take 50 mg by mouth at bedtime as needed for sleep.    Yes  [provider]    ALLERGY: Allergies  Allergen Reactions   Codeine Phosphate [Codeine]     headache   Penicillins Hives, Swelling and Rash    Did it involve swelling of the face/tongue/throat, SOB, or low BP? Yes Did it involve sudden or severe rash/hives, skin peeling, or any reaction on the inside of your mouth or nose? No Did you need to seek medical attention at a hospital or doctor's office? Yes When did it last happen?85 years old If all above answers are NO, may proceed with cephalosporin use.     Social History   Tobacco Use   Smoking status: Never Smoker   Smokeless tobacco: Never Used  Substance Use Topics   Alcohol use: Yes    Comment: rare     No family history on file.   ROS   Review of Systems  Constitutional: Negative.   HENT: Negative.   Eyes: Negative.   Respiratory: Negative.   Cardiovascular: Negative.   Gastrointestinal: Negative.   Genitourinary: Negative.   Musculoskeletal: Positive for falls and joint pain (Widespread secondary to arthritis). Negative for back pain, myalgias and neck pain.  Skin: Negative.   Neurological: Positive for tingling, tremors, sensory change and weakness. Negative for dizziness, speech change, focal weakness, seizures, loss of consciousness and headaches.  Endo/Heme/Allergies: Negative.     Exam   Vitals:   02/04/19 2134 02/04/19 2301  BP: (!) 168/81 137/62  Pulse: 86 85  Resp: 16 19  Temp: 98.2 F (36.8 C) 98.6 F (37 C)  SpO2: 97% 95%   General appearance:   Elderly female, resting comfortably.  In [place] hard c collar Eyes: No scleral injection Cardiovascular: Regular rate and rhythm without murmurs, rubs, gallops. No edema or variciosities. Distal pulses normal. Pulmonary: Effort normal, non-labored breathing Musculoskeletal:     Muscle tone upper extremities: Normal    Muscle tone lower extremities: Normal    Motor exam: Upper Extremities Deltoid Bicep Tricep Grip  Right 2/5  4-/5 4-/5 3/5  Left 4/5 4/5 4/5 4/5    Left wrist extension 4/5 Right wrist extension 3/5  Lower Extremity IP Quad PF DF EHL  Right 5/5 5/5 5/5 5/5 5/5  Left 4+/5 4+/5 4+/5 5/5 5/5   Neurological Mental Status:    - Patient is awake, alert, oriented to person, place, month, year, and situation    - Patient is able to give a clear and coherent history.    - No signs of aphasia or neglect Cranial Nerves    - II: Visual Fields are full. PERRL    -  III/IV/VI: EOMI without ptosis or diploplia.     - V: Facial sensation is grossly normal    - VII: Facial movement is symmetric.     - VIII: hearing is intact to voice    - X: Uvula elevates symmetrically    - XI: Shoulder shrug is symmetric.    - XII: tongue is midline without atrophy or fasciculations.  Sensory: Sensation   Decreased in bilateral hands and to mid right forearm Deep Tendon Reflexes: increased in biceps and  Brachia radialis bilaterally.  Increased in patella bilaterally. Cerebellar    - FNF: unable to check due to the inability to control bilateral hands. No clonus. Slightly positive Hoffman's bilaterally  Results - Imaging/Labs   Results for orders placed or performed during the hospital encounter of 02/04/19 (from the past 48 hour(s))  I-stat Creatinine, ED     Status: None   Collection Time: 02/04/19  3:00 PM  Result Value Ref Range   Creatinine, Ser 0.70 0.44 - 1.00 mg/dL  CBC with Differential/Platelet     Status: None   Collection Time: 02/04/19  3:54 PM  Result Value Ref Range   WBC 5.9 4.0 - 10.5 K/uL   RBC 4.06 3.87 - 5.11 MIL/uL   Hemoglobin 12.3 12.0 - 15.0 g/dL   HCT 95.6 21.3 - 08.6 %   MCV 94.1 80.0 - 100.0 fL   MCH 30.3 26.0 - 34.0 pg   MCHC 32.2 30.0 - 36.0 g/dL   RDW 57.8 46.9 - 62.9 %   Platelets 196 150 - 400 K/uL   nRBC 0.0 0.0 - 0.2 %   Neutrophils Relative % 74 %   Neutro Abs 4.3 1.7 - 7.7 K/uL   Lymphocytes Relative 19 %   Lymphs Abs 1.2 0.7 - 4.0 K/uL   Monocytes Relative 5 %    Monocytes Absolute 0.3 0.1 - 1.0 K/uL   Eosinophils Relative 2 %   Eosinophils Absolute 0.1 0.0 - 0.5 K/uL   Basophils Relative 0 %   Basophils Absolute 0.0 0.0 - 0.1 K/uL   Immature Granulocytes 0 %   Abs Immature Granulocytes 0.02 0.00 - 0.07 K/uL    Comment: Performed at [facility], [address], [place], [place] [address]  Basic metabolic panel     Status: None   Collection Time: 02/04/19  3:54 PM  Result Value Ref Range   Sodium 139 135 - 145 mmol/L   Potassium 4.3 3.5 - 5.1 mmol/L   Chloride 102 98 - 111 mmol/L   CO2 28 22 - 32 mmol/L   Glucose, Bld 98 70 - 99 mg/dL   BUN 20 8 - 23 mg/dL   Creatinine, Ser 3.24 0.44 - 1.00 mg/dL   Calcium 9.0 8.9 - 40.1 mg/dL   GFR calc non Af Amer >60 >60 mL/min   GFR calc Af Amer >60 >60 mL/min   Anion gap 9 5 - 15    Comment: Performed at [facility], [address], [place], [place] [address]    Ct Angio Neck W Or Wo Contrast  Result Date: 02/04/2019 CLINICAL DATA:  Bilateral hand numbness over the last 3 weeks. Chronic neck pain. EXAM: CT ANGIOGRAPHY NECK TECHNIQUE: Multidetector CT imaging of the neck was performed using the standard protocol during bolus administration of intravenous contrast. Multiplanar CT image reconstructions and MIPs were obtained to evaluate the vascular anatomy. Carotid stenosis measurements (when applicable) are obtained utilizing NASCET criteria, using the distal internal carotid diameter as the denominator. CONTRAST:  75mL OMNIPAQUE IOHEXOL 350 MG/ML SOLN COMPARISON:  MRI cervical spine same day FINDINGS: Aortic arch: Ordinary atherosclerotic calcification. No sign of dissection. Branching pattern is normal without origin stenosis. Right carotid system: Common carotid artery widely patent to the bifurcation. Calcified plaque at the carotid bifurcation and ICA bulb but no stenosis. Cervical ICA is widely patent. Left carotid system: Common carotid artery widely patent to the bifurcation. Calcified plaque at the  carotid bifurcation and ICA bulb but no stenosis. Cervical ICA is widely patent to and through the skull base. Vertebral arteries: Both vertebral artery origins are widely patent. The right is dominant. Both vertebral arteries appear normal through the cervical region, including through the C1-2 region and foramen magnum to the basilar. Skeleton: As shown by MRI, there is a chronic nonunited fracture of the dens displaced anteriorly 11 mm with resultant severe spinal stenosis at the C1-2 level. Degenerative spondylosis below that throughout the cervical region as described by MRI. Other neck: No mass or lymphadenopathy. Upper chest: Mild scarring at the lung apices. IMPRESSION: No acute or significant vascular pathology. Ordinary age related atherosclerosis. No carotid or vertebral stenosis or occlusion. 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 Severe spinal stenosis of the canal at the C1-2 level. Electronically Signed   By: Paulina Fusi M.D.   On: 02/04/2019 21:00   Mr Cervical Spine W Or Wo Contrast  Result Date: 02/04/2019 CLINICAL DATA:  Bilateral hand numbness over the last 3 weeks. Chronic neck pain. EXAM: MRI CERVICAL SPINE WITHOUT AND WITH CONTRAST TECHNIQUE: Multiplanar and multiecho pulse sequences of the cervical spine, to include the craniocervical junction and cervicothoracic junction, were obtained without and with intravenous contrast. CONTRAST:  6 cc Gadavist COMPARISON:  None. FINDINGS: Alignment: Anterolisthesis at C1-2 apparently due to a chronic nonunited fracture of the dens measuring 1 cm. 2 mm degenerative anterolisthesis C7-T1. Vertebrae: Chronic nonunited fracture of the dens allowing anterolisthesis of C1 relative to C2 of 1 cm. Cord: Cord flattening at the C1-2 level because of severe canal stenosis, AP diameter only 5 mm. Cord flattening also at C3-4 due to spondylosis. The cord appears atrophic in the upper and mid cervical region. See below.  Posterior Fossa, vertebral arteries, paraspinal tissues: Negative Disc levels: As noted above, there is a chronic nonunited fracture of the dens allowing anterolisthesis at C1-2 of 1 cm. Severe spinal stenosis with AP diameter of the canal only 5-5.5 mm. Cord flattening. C2-3: Mild bulging of the disc.  No canal or foraminal stenosis. C3-4: Spondylosis with endplate osteophytes and bulging of the disc more towards the right. Narrowing of the canal with AP diameter as narrow as 6 mm. Some cord deformity. Foramina sufficiently patent. C4-5: Chronic loss of disc height. Minor endplate osteophytes. AP diameter of the canal 8 mm without cord compression at this level. The cord does appear atrophic. C5-6: Spondylosis with endplate osteophytes and bulging of the disc. AP diameter of the canal as narrow as 7.8 mm. No actual cord compression or compressive foraminal stenosis. C6-7: Spondylosis with endplate osteophytes and bulging of the disc. AP diameter of the canal 8 mm. Foramina widely patent. C7-T1: Facet degeneration with 2 mm of anterolisthesis. Bulging of the disc. AP diameter of the canal 8 mm. No foraminal stenosis. 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 Severe canal stenosis with AP diameter only 5-5.5 mm. Marked cord flattening. Atrophy of the cord below that to the lower cervical region.  Spinal stenosis at C3-4 with AP diameter of 5.8 mm. Slight indentation of the atrophic cord at this level. Degenerative spondylosis below that with canal narrowing, AP diameter only about 8 mm at the disc levels. However, because of cord atrophy, subarachnoid space surrounds the cord at C4-5 and below in there is no additional cord compression. Foramina appear widely patent. Electronically Signed   By: Paulina FusiMark  Shogry M.D.   On: 02/04/2019 15:57   Mr Thoracic Spine W Wo Contrast  Result Date: 02/04/2019 CLINICAL DATA:  Bilateral hand numbness over the last 3 weeks. Some loss of  feeling in the legs. EXAM: MRI THORACIC WITHOUT AND WITH CONTRAST TECHNIQUE: Multiplanar and multiecho pulse sequences of the thoracic spine were obtained without and with intravenous contrast. CONTRAST:  6 cc Gadavist COMPARISON:  None. FINDINGS: MRI THORACIC SPINE FINDINGS Alignment:  Normal Vertebrae: No acute fracture. Old minor superior endplate compression deformities T8 through T11. No advanced compression fracture. These all look healed. Cord: No cord compression in the thoracic region. The cord in general is mildly atrophic. No focal thoracic cord pathology. Paraspinal and other soft tissues: Negative Disc levels: No significant degenerative change in the thoracic region. No disc herniation. Ordinary facet and ligamentous prominence in the mid to lower thoracic spine but no compressive encroachment upon the canal or foramina. IMPRESSION: No compressive stenosis in the thoracic region. The spinal cord shows some generalized atrophy but without focal lesion. Old minor compression deformities T8 through T11 which are healed without residual edema. Electronically Signed   By: Paulina FusiMark  Shogry M.D.   On: 02/04/2019 16:02    Impression/Plan   85 y.o. female  with bilateral upper extremity weakness, right worse than left, secondary to severe cervical spinal stenosis  at C1-2 and C3-4. She will need to undergo cervical decompression to prevent worsening symptoms.  Dr. Conchita ParisNundkumar to follow up with patient tomorrow regarding plan for surgery.  NPO at midnight.  Cindra PresumeVincent Tenille Morrill, PA-C [facility]

## 2019-02-04 NOTE — ED Notes (Addendum)
Pt in MRI.

## 2019-02-04 NOTE — ED Provider Notes (Signed)
[facility] EMERGENCY DEPARTMENT Provider Note   CSN: [id number] Arrival date & time: 02/04/19  1413   History   Chief Complaint Chief Complaint  Patient presents with   Numbness    HPI Denise Espinoza is a 85 y.o. female.     HPI  The pt has had neck pain for months - seems to be much worse over the last 3 weeks - she has had more numbness / weakness of the arms and is now for weeks having to use her left hand to move her right hand.  She has some assocaited numbness and weaknes sin the R leg.  She has no headache but does have chronic neck pain, [place] is from [place], has not seen her PCP recently - [facility] but has a telemedicine visit only and was more concerned since the weakness is progressive.  She has no systemic sx and no hx of CA.  She has not formally been diagnosed with RA but does have "arthritis" and states he neck pops and cracks for a long time.  Past Medical History:  Diagnosis Date   Allergic rhinitis    Anosmia    Arthritis    Full dentures    History of diverticulitis    Insomnia    Insomnia    Senile osteoporosis    SOB (shortness of breath)    SVT (supraventricular tachycardia) (HCC)    Varicose vein of leg    left   Wears glasses     Patient Active Problem List   Diagnosis Date Noted   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    Past Surgical History:  Procedure Laterality Date   COLON RESECTION  1989   diverticulitis   COLONOSCOPY     ESOPHAGEAL DILATION  2015   endoscopy     OB History   No obstetric history on file.      Home Medications    Prior to Admission medications   Medication Sig Start Date End Date Taking? Authorizing Provider  calcium-vitamin D (OSCAL WITH D) 500-200 MG-UNIT per tablet Take 1 tablet by mouth 2 (two) times daily.    Yes [provider]  metoprolol succinate (TOPROL-XL) 25 MG 24 hr tablet Take 12.5 mg by mouth daily.   Yes [provider]  rosuvastatin (CRESTOR) 5 MG tablet Take 5 mg by mouth daily.   Yes [provider]  traMADol (ULTRAM) 50 MG tablet Take 50 mg by mouth every 6 (six) hours as needed for moderate pain.    Yes [provider]  traZODone (DESYREL) 50 MG tablet Take 50 mg by mouth at bedtime as needed for sleep.    Yes [provider]  denosumab (PROLIA) 60 MG/ML SOLN injection Inject 60 mg into the skin every 6 (six) months. Administer in upper arm, thigh, or abdomen    [provider]    Family History No family history on file.  Social History Social History   Tobacco Use   Smoking status: Never Smoker   Smokeless tobacco: Never Used  Substance Use Topics   Alcohol use: Yes    Comment: rare   Drug use: No     Allergies   Codeine phosphate [codeine] and Penicillins   Review of Systems Review of Systems  Constitutional: Negative for fever.  Eyes: Negative for pain and redness.  Respiratory: Negative for cough and shortness of breath.   Cardiovascular: Negative for leg swelling.  Gastrointestinal: Negative for  nausea and vomiting.  Genitourinary: Negative for dysuria.  Musculoskeletal: Positive for neck pain.  Skin: Negative for rash and wound.  Neurological: Positive for weakness and numbness.  Hematological: Does not bruise/bleed easily.  All other systems reviewed and are negative.    Physical Exam Updated Vital Signs BP (!) 154/75    Pulse 84    Temp 98.2 F (36.8 C) (Oral)    Resp 16    Ht 1.6 m ( )    Wt 65.8 kg    SpO2 97%    BMI 25.69 kg/m   Physical Exam Vitals signs and nursing note reviewed.  Constitutional:      General: She is not in acute distress.    Appearance: She is well-developed.  HENT:     Head: Normocephalic and atraumatic.     Mouth/Throat:     Pharynx: No oropharyngeal exudate.  Eyes:     General: No scleral icterus.       Right eye: No discharge.        Left eye: No discharge.      Conjunctiva/sclera: Conjunctivae normal.     Pupils: Pupils are equal, round, and reactive to light.  Neck:     Musculoskeletal: Normal range of motion and neck supple.     Thyroid: No thyromegaly.     Vascular: No JVD.  Cardiovascular:     Rate and Rhythm: Normal rate and regular rhythm.     Heart sounds: Normal heart sounds. No murmur. No friction rub. No gallop.   Pulmonary:     Effort: Pulmonary effort is normal. No respiratory distress.     Breath sounds: Normal breath sounds. No wheezing or rales.  Abdominal:     General: Bowel sounds are normal. There is no distension.     Palpations: Abdomen is soft. There is no mass.     Tenderness: There is no abdominal tenderness.  Musculoskeletal: Normal range of motion.        General: No tenderness.     Comments: Some deformity of the hands - rheumatoid appearance of fingers.   Lymphadenopathy:     Cervical: No cervical adenopathy.  Skin:    General: Skin is warm and dry.     Findings: No erythema or rash.  Neurological:     Mental Status: She is alert.     Coordination: Coordination normal.     Comments: Weak and numb in the RUE - numb at hands bilaterally - involves forearms - upper arms are mores sensitive - seems hyperreflexic of the brachioradialis.  There is weakness to the R grip and bicep and supination.  Legs with some weakness at R hip and knee - some decreased sensation to the R leg light touch and pinprick.  Speech is  Clear and appropriate memory  Psychiatric:        Behavior: Behavior normal.      ED Treatments / Results  Labs (all labs ordered are listed, but only abnormal results are displayed) Labs Reviewed  CBC WITH DIFFERENTIAL/PLATELET  BASIC METABOLIC PANEL  I-STAT CREATININE, ED    EKG None  Radiology Mr Cervical Spine W Or Wo Contrast  Result Date: 02/04/2019 CLINICAL DATA:  Bilateral hand numbness over the last 3 weeks. Chronic neck pain. EXAM: MRI CERVICAL SPINE WITHOUT AND WITH CONTRAST  TECHNIQUE: Multiplanar and multiecho pulse sequences of the cervical spine, to include the craniocervical junction and cervicothoracic junction, were obtained without and with intravenous contrast. CONTRAST:  6 cc Gadavist COMPARISON:  None. FINDINGS: Alignment: Anterolisthesis at C1-2 apparently due to a chronic nonunited fracture of the dens measuring 1 cm. 2 mm degenerative anterolisthesis C7-T1. Vertebrae: Chronic nonunited fracture of the dens allowing anterolisthesis of C1 relative to C2 of 1 cm. Cord: Cord flattening at the C1-2 level because of severe canal stenosis, AP diameter only 5 mm. Cord flattening also at C3-4 due to spondylosis. The cord appears atrophic in the upper and mid cervical region. See below. Posterior Fossa, vertebral arteries, paraspinal tissues: Negative Disc levels: As noted above, there is a chronic nonunited fracture of the dens allowing anterolisthesis at C1-2 of 1 cm. Severe spinal stenosis with AP diameter of the canal only 5-5.5 mm. Cord flattening. C2-3: Mild bulging of the disc.  No canal or foraminal stenosis. C3-4: Spondylosis with endplate osteophytes and bulging of the disc more towards the right. Narrowing of the canal with AP diameter as narrow as 6 mm. Some cord deformity. Foramina sufficiently patent. C4-5: Chronic loss of disc height. Minor endplate osteophytes. AP diameter of the canal 8 mm without cord compression at this level. The cord does appear atrophic. C5-6: Spondylosis with endplate osteophytes and bulging of the disc. AP diameter of the canal as narrow as 7.8 mm. No actual cord compression or compressive foraminal stenosis. C6-7: Spondylosis with endplate osteophytes and bulging of the disc. AP diameter of the canal 8 mm. Foramina widely patent. C7-T1: Facet degeneration with 2 mm of anterolisthesis. Bulging of the disc. AP diameter of the canal 8 mm. No foraminal stenosis. 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 Severe canal stenosis with AP diameter only 5-5.5 mm. Marked cord flattening. Atrophy of the cord below that to the lower cervical region. Spinal stenosis at C3-4 with AP diameter of 5.8 mm. Slight indentation of the atrophic cord at this level. Degenerative spondylosis below that with canal narrowing, AP diameter only about 8 mm at the disc levels. However, because of cord atrophy, subarachnoid space surrounds the cord at C4-5 and below in there is no additional cord compression. Foramina appear widely patent. Electronically Signed   By: Paulina FusiMark  Shogry M.D.   On: 02/04/2019 15:57   Mr Thoracic Spine W Wo Contrast  Result Date: 02/04/2019 CLINICAL DATA:  Bilateral hand numbness over the last 3 weeks. Some loss of feeling in the legs. EXAM: MRI THORACIC WITHOUT AND WITH CONTRAST TECHNIQUE: Multiplanar and multiecho pulse sequences of the thoracic spine were obtained without and with intravenous contrast. CONTRAST:  6 cc Gadavist COMPARISON:  None. FINDINGS: MRI THORACIC SPINE FINDINGS Alignment:  Normal Vertebrae: No acute fracture. Old minor superior endplate compression deformities T8 through T11. No advanced compression fracture. These all look healed. Cord: No cord compression in the thoracic region. The cord in general is mildly atrophic. No focal thoracic cord pathology. Paraspinal and other soft tissues: Negative Disc levels: No significant degenerative change in the thoracic region. No disc herniation. Ordinary facet and ligamentous prominence in the mid to lower thoracic spine but no compressive encroachment upon the canal or foramina. IMPRESSION: No compressive stenosis in the thoracic region. The spinal cord shows some generalized atrophy but without focal lesion. Old minor compression deformities T8 through T11 which are healed without residual edema. Electronically Signed   By: Paulina FusiMark  Shogry M.D.   On: 02/04/2019 16:02    Procedures .Critical Care Performed by: Eber HongMiller, Jakhia Buxton, MD Authorized by:  Eber HongMiller, Hurshell Dino, MD   Critical care provider statement:    Critical care  time (minutes):  35   Critical care time was exclusive of:  Separately billable procedures and treating other patients and teaching time   Critical care was necessary to treat or prevent imminent or life-threatening deterioration of the following conditions:  CNS failure or compromise   Critical care was time spent personally by me on the following activities:  Blood draw for specimens, development of treatment plan with patient or surrogate, discussions with consultants, evaluation of patient's response to treatment, examination of patient, obtaining history from patient or surrogate, ordering and performing treatments and interventions, ordering and review of laboratory studies, ordering and review of radiographic studies, pulse oximetry, re-evaluation of patient's condition and review of old charts   (including critical care time)  Medications Ordered in ED Medications  traZODone (DESYREL) tablet 50 mg (has no administration in time range)  calcium-vitamin D (OSCAL WITH D) 500-200 MG-UNIT per tablet 1 tablet (has no administration in time range)  rosuvastatin (CRESTOR) tablet 5 mg (has no administration in time range)  metoprolol succinate (TOPROL-XL) 24 hr tablet 12.5 mg (has no administration in time range)  sodium chloride flush (NS) 0.9 % injection 3 mL (has no administration in time range)  sodium chloride flush (NS) 0.9 % injection 3 mL (has no administration in time range)  0.9 %  sodium chloride infusion (has no administration in time range)  0.9 %  sodium chloride infusion (has no administration in time range)  ondansetron (ZOFRAN) tablet 4 mg (has no administration in time range)    Or  ondansetron (ZOFRAN) injection 4 mg (has no administration in time range)  sodium phosphate (FLEET) 7-19 GM/118ML enema 1 enema (has no administration in time range)  bisacodyl (DULCOLAX) EC tablet 5 mg (has no administration  in time range)  senna-docusate (Senokot-S) tablet 1 tablet (has no administration in time range)  docusate sodium (COLACE) capsule 100 mg (has no administration in time range)  zolpidem (AMBIEN) tablet 5 mg (has no administration in time range)  HYDROcodone-acetaminophen (NORCO/VICODIN) 5-325 MG per tablet 1-2 tablet (has no administration in time range)  acetaminophen (TYLENOL) tablet 650 mg (has no administration in time range)    Or  acetaminophen (TYLENOL) suppository 650 mg (has no administration in time range)  traMADol (ULTRAM) tablet 50 mg (has no administration in time range)  gadobutrol (GADAVIST) 1 MMOL/ML injection 6 mL (6 mLs Intravenous Contrast Given 02/04/19 1510)     Initial Impression / Assessment and Plan / ED Course  I have reviewed the triage vital signs and the nursing notes.  Pertinent labs & imaging results that were available during my care of the patient were reviewed by me and considered in my medical decision making (see chart for details).        Paged NS for recommendations - MR C spine ordered.  D/w Dr. Venetia Maxon - agrees with MRI C and T spine - will d/w him by phone after and arrange for appropriate care based on findings.   The pt has severe neurological symtpoms that have been progressing and has been found on MRI to have Cervical fracture and cord compression - this has been a gradual 3 week decline -   I have secured admission for patient at [facility], placed her in a Aspen collar and have arranged transport by EMS - she will be see by NS on arrival - they will place bed orders.  Pt has a critical illness  Final Clinical Impressions(s) / ED Diagnoses   Final diagnoses:  Spinal cord compression (HCC)  Other closed displaced fracture of second cervical vertebra, initial encounter [facility])      Eber Hong, MD 02/04/19 [phone]

## 2019-02-04 NOTE — Progress Notes (Signed)
  NEUROSURGERY PROGRESS NOTE   Received call from Dr Adriana Simas, EDP at [facility], regarding patient. 85 year old with progressive numbness/weakness over course of several weeks. MRI C Spine and T spine ordered. C spine reveals multilevel cervical stenosis most prominent at C1-2 where there is a prior nonunion fracture. Will admit to [facility] for NS evaluation and possible surgical intervention. Admission orders entered.  Maintain aspen hard c collar at all times. CTA ordered to assess vasculature for surgical prep.  Cindra Presume, PA-C [facility]

## 2019-02-04 NOTE — ED Notes (Addendum)
Pt just returned from MRI. A/o. apsen collared applied. See triage notes.

## 2019-02-04 NOTE — Progress Notes (Signed)
Patient arrived on the unit from [facility] [place] via stretcher and dispatched Personnel, Pt alert and oriented x 4  denied pain .  MD at bedside evaluating and talking with pt right now.

## 2019-02-04 NOTE — ED Triage Notes (Addendum)
Pt reports bilateral hand numbness for 3 weeks. Pt reports right worse than left. Reports chronic neck pain and losing feeling in legs as well

## 2019-02-05 NOTE — TOC Initial Note (Signed)
Transition of Care [facility]) - Initial/Assessment Note    Patient Details  Name: Denise Espinoza MRN: [id number] Date of Birth: 06-17-1930  Transition of Care [facility]) CM/SW Contact:    Kermit Balo, RN Phone Number: 02/05/2019, 2:44 PM  Clinical Narrative:                 Pt denies issues obtaining or taking meds at home. Family is able to provide needed transportation. Awaiting PT/OT evals post surgery for d/c disposition.  Expected Discharge Plan: Home/Self Care Barriers to Discharge: Continued Medical Work up   Patient Goals and CMS Choice        Expected Discharge Plan and Services Expected Discharge Plan: Home/Self Care       Living arrangements for the past 2 months: Single Family Home(one level home with basement)                                      Prior Living Arrangements/Services Living arrangements for the past 2 months: Single Family Home(one level home with basement) Lives with:: Adult Children(son and daughter and grandson) Patient language and need for interpreter reviewed:: Yes(no needs) Do you feel safe going back to the place where you live?: Yes        Care giver support system in place?: Yes (comment)(per patient she has 24 hour supervision ) Current home services: DME(cane, walker, seat in shower) Criminal Activity/Legal Involvement Pertinent to Current Situation/Hospitalization: No - Comment as needed  Activities of Daily Living Home Assistive Devices/Equipment: Blood pressure cuff, Walker (specify type) ADL Screening (condition at time of admission) Patient's cognitive ability adequate to safely complete daily activities?: Yes Is the patient deaf or have difficulty hearing?: No Does the patient have difficulty seeing, even when wearing glasses/contacts?: No Does the patient have difficulty concentrating, remembering, or making decisions?: No Patient able to express need for assistance with ADLs?: Yes Does the patient have difficulty  dressing or bathing?: Yes Independently performs ADLs?: Yes (appropriate for developmental age) Does the patient have difficulty walking or climbing stairs?: No Weakness of Legs: None Weakness of Arms/Hands: Both  Permission Sought/Granted                  Emotional Assessment Appearance:: Appears stated age Attitude/Demeanor/Rapport: Engaged Affect (typically observed): Accepting, Pleasant, Appropriate Orientation: : Oriented to Self, Oriented to Place, Oriented to  Time, Oriented to Situation   Psych Involvement: No (comment)  Admission diagnosis:  Spinal cord compression (HCC) [G95.20] Other closed displaced fracture of second cervical vertebra, initial encounter [facility]) [S12.190A] Patient Active Problem List   Diagnosis Date Noted  . 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   PCP:  Tacy Learn, FNP Pharmacy:   [facility] [address], [place] - [address] [place] [address] Phone: [phone] Fax: [phone]     Social Determinants of Health (SDOH) Interventions    Readmission Risk Interventions No flowsheet data found.

## 2019-02-05 NOTE — Progress Notes (Signed)
  NEUROSURGERY PROGRESS NOTE   No issues overnight. History reviewed, pt presents with several weeks of progressively worsening R>L arm weakness, although she does also admit to gait instability.  EXAM:  BP (!) 148/76 (BP Location: Left Arm)   Pulse 77   Temp 98.2 F (36.8 C) (Oral)   Resp 18   Ht 5\' 3"  (1.6 m)   Wt 65.8 kg   SpO2 99%   BMI 25.69 kg/m   Awake, alert, oriented  Speech fluent, appropriate  CN grossly intact  5/5 LUE x decreased finger extension and grip 3/5 RUE deltoid, 2/5 bicep, 3/5 tricep, minimal finger extension/grip 5/5 BLE  IMAGING: MRI demonstrates displaced type II odontoid fracture with associated severe stenosis. CT demonstrates nonunion of the fracture and somewhat ectatic vertebral arteries bilaterally.  IMPRESSION:  85 y.o. female with non-healing displaced type II odontoid fracture with associated severe stenosis and progressive myelopathy. She will need decompression and possibly stabilization  PLAN: - Will plan on OR tomorrow for operative decompression and possible stabilization if the C1-2 segement is significantly mobile.  I have reviewed the situation with the patient including my recommendation for surgery. We discussed details of surgery including the need for decompression and possible need for screws for stabilization. Risks of surgery including vertebral artery injury leading to potentially life-threatening bleeding or stroke, CSF leak, spinal cord injury leading to worsening weakness, and infection were all discussed. Possible hardware failure with her advanced age and hx of RA was also discussed. We talked about expected postoperative course. All her questions today were answered and she provided verbal consent to proceed with surgery.

## 2019-02-05 NOTE — Progress Notes (Signed)
Chaplain responded to request for information about HCPOA/AD.  Provided forms and education, responded to questions.  Offered further support as desired.  Please contact if needed.  Theodoro Parma [phone]     02/05/19 1900  Clinical Encounter Type  Visited With Patient  Visit Type Initial (AD education)  Referral From Patient  Consult/Referral To Chaplain  Stress Factors  Patient Stress Factors Health changes

## 2019-02-06 ENCOUNTER — Inpatient Hospital Stay (HOSPITAL_COMMUNITY): Payer: Medicare Other

## 2019-02-06 ENCOUNTER — Encounter (HOSPITAL_COMMUNITY): Payer: Self-pay

## 2019-02-06 ENCOUNTER — Inpatient Hospital Stay (HOSPITAL_COMMUNITY): Payer: Medicare Other | Admitting: Certified Registered Nurse Anesthetist

## 2019-02-06 ENCOUNTER — Encounter (HOSPITAL_COMMUNITY)
Admission: EM | Disposition: A | Discharge status: Rehabilitation Facility | Payer: Self-pay | Source: Home / Self Care | Attending: Neurosurgery

## 2019-02-06 HISTORY — PX: POSTERIOR CERVICAL LAMINECTOMY: SHX2248

## 2019-02-06 SURGERY — POSTERIOR CERVICAL LAMINECTOMY
Anesthesia: General | Site: Neck

## 2019-02-06 MED ORDER — METHOCARBAMOL 500 MG PO TABS
500.0000 mg | ORAL_TABLET | Freq: Four times a day (QID) | ORAL | Status: DC | PRN
  Administered 2019-02-06 – 2019-02-10 (×2): 500 mg via ORAL
  Filled 2019-02-06: qty 1

## 2019-02-06 MED ORDER — FLEET ENEMA 7-19 GM/118ML RE ENEM: 1.0000 | ENEMA | Freq: Once | RECTAL | Status: DC | PRN

## 2019-02-06 MED ORDER — ACETAMINOPHEN 325 MG PO TABS
650.0000 mg | ORAL_TABLET | ORAL | Status: DC | PRN
  Administered 2019-02-08: 22:00:00 650 mg via ORAL
  Filled 2019-02-06 (×2): qty 2

## 2019-02-06 MED ORDER — METHOCARBAMOL 500 MG PO TABS
ORAL_TABLET | ORAL | Status: AC
  Administered 2019-02-06: 19:00:00
  Filled 2019-02-06: qty 1

## 2019-02-06 MED ORDER — HYDROMORPHONE HCL 1 MG/ML IJ SOLN
0.5000 mg | INTRAMUSCULAR | Status: DC | PRN
  Administered 2019-02-08: 0.5 mg via INTRAVENOUS
  Filled 2019-02-06: qty 0.5

## 2019-02-06 MED ORDER — EPINEPHRINE PF 1 MG/ML IJ SOLN
INTRAMUSCULAR | Status: AC
  Filled 2019-02-06: qty 1

## 2019-02-06 MED ORDER — ROCURONIUM BROMIDE 10 MG/ML (PF) SYRINGE
PREFILLED_SYRINGE | INTRAVENOUS | Status: DC | PRN
  Administered 2019-02-06 (×2): 20 mg via INTRAVENOUS
  Administered 2019-02-06: 10 mg via INTRAVENOUS
  Administered 2019-02-06: 20 mg via INTRAVENOUS
  Administered 2019-02-06: 10 mg via INTRAVENOUS

## 2019-02-06 MED ORDER — SENNA 8.6 MG PO TABS
1.0000 | ORAL_TABLET | Freq: Two times a day (BID) | ORAL | Status: DC
  Administered 2019-02-06 – 2019-02-15 (×17): 8.6 mg via ORAL
  Filled 2019-02-06 (×17): qty 1

## 2019-02-06 MED ORDER — THROMBIN 5000 UNITS EX SOLR
CUTANEOUS | Status: DC | PRN
  Administered 2019-02-06 (×2): 5000 [IU] via TOPICAL

## 2019-02-06 MED ORDER — FENTANYL CITRATE (PF) 250 MCG/5ML IJ SOLN
INTRAMUSCULAR | Status: AC
  Filled 2019-02-06: qty 5

## 2019-02-06 MED ORDER — FENTANYL CITRATE (PF) 100 MCG/2ML IJ SOLN
INTRAMUSCULAR | Status: AC
  Administered 2019-02-06: 19:00:00
  Filled 2019-02-06: qty 2

## 2019-02-06 MED ORDER — PROPOFOL 10 MG/ML IV BOLUS
INTRAVENOUS | Status: DC | PRN
  Administered 2019-02-06: 50 mg via INTRAVENOUS
  Administered 2019-02-06: 150 mg via INTRAVENOUS

## 2019-02-06 MED ORDER — CEFAZOLIN SODIUM-DEXTROSE 2-4 GM/100ML-% IV SOLN
2.0000 g | Freq: Three times a day (TID) | INTRAVENOUS | Status: AC
  Administered 2019-02-06 – 2019-02-07 (×2): 2 g via INTRAVENOUS
  Filled 2019-02-06 (×2): qty 100

## 2019-02-06 MED ORDER — LACTATED RINGERS IV SOLN
INTRAVENOUS | Status: DC | PRN
  Administered 2019-02-06: 12:00:00 via INTRAVENOUS

## 2019-02-06 MED ORDER — DOCUSATE SODIUM 100 MG PO CAPS
100.0000 mg | ORAL_CAPSULE | Freq: Two times a day (BID) | ORAL | Status: DC
  Administered 2019-02-06 – 2019-02-15 (×17): 100 mg via ORAL
  Filled 2019-02-06 (×17): qty 1

## 2019-02-06 MED ORDER — SODIUM CHLORIDE 0.9 % IV SOLN: INTRAVENOUS | Status: DC

## 2019-02-06 MED ORDER — PHENYLEPHRINE 40 MCG/ML (10ML) SYRINGE FOR IV PUSH (FOR BLOOD PRESSURE SUPPORT)
PREFILLED_SYRINGE | INTRAVENOUS | Status: DC | PRN
  Administered 2019-02-06 (×2): 80 ug via INTRAVENOUS

## 2019-02-06 MED ORDER — SUCCINYLCHOLINE CHLORIDE 200 MG/10ML IV SOSY
PREFILLED_SYRINGE | INTRAVENOUS | Status: AC
  Filled 2019-02-06: qty 10

## 2019-02-06 MED ORDER — DEXAMETHASONE SODIUM PHOSPHATE 10 MG/ML IJ SOLN
INTRAMUSCULAR | Status: DC | PRN
  Administered 2019-02-06: 10 mg via INTRAVENOUS

## 2019-02-06 MED ORDER — ACETAMINOPHEN 650 MG RE SUPP: 650.0000 mg | RECTAL | Status: DC | PRN

## 2019-02-06 MED ORDER — ACETAMINOPHEN 10 MG/ML IV SOLN
INTRAVENOUS | Status: AC
  Filled 2019-02-06: qty 100

## 2019-02-06 MED ORDER — PHENOL 1.4 % MT LIQD: 1.0000 | OROMUCOSAL | Status: DC | PRN

## 2019-02-06 MED ORDER — FENTANYL CITRATE (PF) 100 MCG/2ML IJ SOLN
25.0000 ug | INTRAMUSCULAR | Status: DC | PRN
  Administered 2019-02-06 (×2): 50 ug via INTRAVENOUS

## 2019-02-06 MED ORDER — ONDANSETRON HCL 4 MG/2ML IJ SOLN: 4.0000 mg | Freq: Once | INTRAMUSCULAR | Status: DC | PRN

## 2019-02-06 MED ORDER — LIDOCAINE 2% (20 MG/ML) 5 ML SYRINGE
INTRAMUSCULAR | Status: DC | PRN
  Administered 2019-02-06: 60 mg via INTRAVENOUS

## 2019-02-06 MED ORDER — SODIUM CHLORIDE 0.9% FLUSH: 3.0000 mL | INTRAVENOUS | Status: DC | PRN

## 2019-02-06 MED ORDER — LIDOCAINE 2% (20 MG/ML) 5 ML SYRINGE
INTRAMUSCULAR | Status: AC
  Filled 2019-02-06: qty 10

## 2019-02-06 MED ORDER — ROCURONIUM BROMIDE 10 MG/ML (PF) SYRINGE
PREFILLED_SYRINGE | INTRAVENOUS | Status: AC
  Filled 2019-02-06: qty 10

## 2019-02-06 MED ORDER — DEXAMETHASONE SODIUM PHOSPHATE 10 MG/ML IJ SOLN
INTRAMUSCULAR | Status: AC
  Filled 2019-02-06: qty 1

## 2019-02-06 MED ORDER — SODIUM CHLORIDE 0.9 % IV SOLN
INTRAVENOUS | Status: DC | PRN
  Administered 2019-02-06: 12:00:00

## 2019-02-06 MED ORDER — ACETAMINOPHEN 500 MG PO TABS
1000.0000 mg | ORAL_TABLET | Freq: Four times a day (QID) | ORAL | Status: AC
  Administered 2019-02-06 – 2019-02-07 (×4): 1000 mg via ORAL
  Filled 2019-02-06 (×4): qty 2

## 2019-02-06 MED ORDER — SODIUM CHLORIDE 0.9% FLUSH: 3.0000 mL | Freq: Two times a day (BID) | INTRAVENOUS | Status: DC

## 2019-02-06 MED ORDER — SUCCINYLCHOLINE CHLORIDE 200 MG/10ML IV SOSY
PREFILLED_SYRINGE | INTRAVENOUS | Status: DC | PRN
  Administered 2019-02-06: 100 mg via INTRAVENOUS

## 2019-02-06 MED ORDER — SODIUM CHLORIDE 0.9 % IV SOLN: 250.0000 mL | INTRAVENOUS | Status: DC

## 2019-02-06 MED ORDER — SUGAMMADEX SODIUM 200 MG/2ML IV SOLN
INTRAVENOUS | Status: DC | PRN
  Administered 2019-02-06: 150 mg via INTRAVENOUS

## 2019-02-06 MED ORDER — OXYCODONE HCL 5 MG PO TABS
5.0000 mg | ORAL_TABLET | ORAL | Status: DC | PRN
  Administered 2019-02-06 (×2): 10 mg via ORAL
  Administered 2019-02-07: 06:00:00 5 mg via ORAL
  Administered 2019-02-07 – 2019-02-08 (×2): 10 mg via ORAL
  Administered 2019-02-09: 5 mg via ORAL
  Administered 2019-02-09 – 2019-02-10 (×2): 10 mg via ORAL
  Administered 2019-02-12 (×4): 5 mg via ORAL
  Administered 2019-02-14 – 2019-02-15 (×3): 10 mg via ORAL
  Administered 2019-02-15: 5 mg via ORAL
  Filled 2019-02-06: qty 1
  Filled 2019-02-06 (×2): qty 2
  Filled 2019-02-06 (×3): qty 1
  Filled 2019-02-06 (×2): qty 2
  Filled 2019-02-06 (×3): qty 1
  Filled 2019-02-06 (×4): qty 2

## 2019-02-06 MED ORDER — BUPIVACAINE HCL (PF) 0.5 % IJ SOLN
INTRAMUSCULAR | Status: AC
  Filled 2019-02-06: qty 30

## 2019-02-06 MED ORDER — OXYCODONE HCL 5 MG PO TABS
ORAL_TABLET | ORAL | Status: AC
  Administered 2019-02-06: 19:00:00
  Filled 2019-02-06: qty 2

## 2019-02-06 MED ORDER — HEMOSTATIC AGENTS (NO CHARGE) OPTIME
TOPICAL | Status: DC | PRN
  Administered 2019-02-06: 1 via TOPICAL

## 2019-02-06 MED ORDER — ACETAMINOPHEN 10 MG/ML IV SOLN: 1000.0000 mg | Freq: Once | INTRAVENOUS | Status: DC | PRN

## 2019-02-06 MED ORDER — THROMBIN 5000 UNITS EX SOLR
OROMUCOSAL | Status: DC | PRN
  Administered 2019-02-06: 12:00:00 via TOPICAL

## 2019-02-06 MED ORDER — BISACODYL 10 MG RE SUPP
10.0000 mg | Freq: Every day | RECTAL | Status: DC | PRN
  Administered 2019-02-13: 09:00:00 10 mg via RECTAL
  Filled 2019-02-06 (×2): qty 1

## 2019-02-06 MED ORDER — LACTATED RINGERS IV SOLN
INTRAVENOUS | Status: DC
  Administered 2019-02-06 – 2019-02-11 (×3): via INTRAVENOUS

## 2019-02-06 MED ORDER — PROPOFOL 10 MG/ML IV BOLUS
INTRAVENOUS | Status: AC
  Filled 2019-02-06: qty 20

## 2019-02-06 MED ORDER — VANCOMYCIN HCL IN DEXTROSE 1-5 GM/200ML-% IV SOLN
INTRAVENOUS | Status: AC
  Filled 2019-02-06: qty 200

## 2019-02-06 MED ORDER — BUPIVACAINE HCL (PF) 0.5 % IJ SOLN
INTRAMUSCULAR | Status: DC | PRN
  Administered 2019-02-06: 8 mL

## 2019-02-06 MED ORDER — BACITRACIN ZINC 500 UNIT/GM EX OINT
TOPICAL_OINTMENT | CUTANEOUS | Status: AC
  Filled 2019-02-06: qty 28.35

## 2019-02-06 MED ORDER — VANCOMYCIN HCL 1000 MG IV SOLR
INTRAVENOUS | Status: DC | PRN
  Administered 2019-02-06: 1000 mg via INTRAVENOUS

## 2019-02-06 MED ORDER — LIDOCAINE-EPINEPHRINE 1 %-1:100000 IJ SOLN
INTRAMUSCULAR | Status: AC
  Filled 2019-02-06: qty 1

## 2019-02-06 MED ORDER — SODIUM CHLORIDE 0.9 % IV SOLN
INTRAVENOUS | Status: DC | PRN
  Administered 2019-02-06: 50 ug/min via INTRAVENOUS

## 2019-02-06 MED ORDER — THROMBIN 5000 UNITS EX SOLR
CUTANEOUS | Status: AC
  Filled 2019-02-06: qty 15000

## 2019-02-06 MED ORDER — ONDANSETRON HCL 4 MG PO TABS: 4.0000 mg | ORAL_TABLET | Freq: Four times a day (QID) | ORAL | Status: DC | PRN

## 2019-02-06 MED ORDER — ACETAMINOPHEN 10 MG/ML IV SOLN
INTRAVENOUS | Status: DC | PRN
  Administered 2019-02-06: 1000 mg via INTRAVENOUS

## 2019-02-06 MED ORDER — ONDANSETRON HCL 4 MG/2ML IJ SOLN
INTRAMUSCULAR | Status: AC
  Filled 2019-02-06: qty 2

## 2019-02-06 MED ORDER — FENTANYL CITRATE (PF) 250 MCG/5ML IJ SOLN
INTRAMUSCULAR | Status: DC | PRN
  Administered 2019-02-06: 25 ug via INTRAVENOUS
  Administered 2019-02-06 (×6): 50 ug via INTRAVENOUS

## 2019-02-06 MED ORDER — MENTHOL 3 MG MT LOZG: 1.0000 | LOZENGE | OROMUCOSAL | Status: DC | PRN

## 2019-02-06 MED ORDER — GABAPENTIN 300 MG PO CAPS
300.0000 mg | ORAL_CAPSULE | Freq: Three times a day (TID) | ORAL | Status: DC
  Administered 2019-02-06 – 2019-02-15 (×26): 300 mg via ORAL
  Filled 2019-02-06 (×26): qty 1

## 2019-02-06 MED ORDER — ESMOLOL HCL 100 MG/10ML IV SOLN
INTRAVENOUS | Status: DC | PRN
  Administered 2019-02-06: 30 mg via INTRAVENOUS

## 2019-02-06 MED ORDER — METHOCARBAMOL 1000 MG/10ML IJ SOLN
500.0000 mg | Freq: Four times a day (QID) | INTRAVENOUS | Status: DC | PRN
  Filled 2019-02-06: qty 5

## 2019-02-06 MED ORDER — 0.9 % SODIUM CHLORIDE (POUR BTL) OPTIME
TOPICAL | Status: DC | PRN
  Administered 2019-02-06: 12:00:00 1000 mL

## 2019-02-06 MED ORDER — ONDANSETRON HCL 4 MG/2ML IJ SOLN
4.0000 mg | Freq: Four times a day (QID) | INTRAMUSCULAR | Status: DC | PRN
  Administered 2019-02-06 – 2019-02-11 (×3): 4 mg via INTRAVENOUS
  Filled 2019-02-06 (×4): qty 2

## 2019-02-06 MED ORDER — PHENYLEPHRINE 40 MCG/ML (10ML) SYRINGE FOR IV PUSH (FOR BLOOD PRESSURE SUPPORT)
PREFILLED_SYRINGE | INTRAVENOUS | Status: AC
  Filled 2019-02-06: qty 10

## 2019-02-06 MED ORDER — LIDOCAINE-EPINEPHRINE 1 %-1:100000 IJ SOLN
INTRAMUSCULAR | Status: DC | PRN
  Administered 2019-02-06: 8 mL

## 2019-02-06 MED ORDER — HYDROCODONE-ACETAMINOPHEN 5-325 MG PO TABS
1.0000 | ORAL_TABLET | ORAL | Status: DC | PRN
  Administered 2019-02-08 – 2019-02-14 (×8): 1 via ORAL
  Filled 2019-02-06 (×9): qty 1

## 2019-02-06 MED ORDER — BACITRACIN ZINC 500 UNIT/GM EX OINT
TOPICAL_OINTMENT | CUTANEOUS | Status: DC | PRN
  Administered 2019-02-06: 1 via TOPICAL

## 2019-02-06 MED ORDER — GLYCOPYRROLATE PF 0.2 MG/ML IJ SOSY
PREFILLED_SYRINGE | INTRAMUSCULAR | Status: AC
  Filled 2019-02-06: qty 1

## 2019-02-06 MED ORDER — ONDANSETRON HCL 4 MG/2ML IJ SOLN
INTRAMUSCULAR | Status: DC | PRN
  Administered 2019-02-06: 4 mg via INTRAVENOUS

## 2019-02-06 MED ORDER — SENNOSIDES-DOCUSATE SODIUM 8.6-50 MG PO TABS: 1.0000 | ORAL_TABLET | Freq: Every evening | ORAL | Status: DC | PRN

## 2019-02-06 SURGICAL SUPPLY — 91 items
BAG DECANTER FOR FLEXI CONT (MISCELLANEOUS) ×3 IMPLANT
BENZOIN TINCTURE PRP APPL 2/3 (GAUZE/BANDAGES/DRESSINGS) ×6 IMPLANT
BIT DRILL 2.4 (BIT) ×2 IMPLANT
BIT DRILL 2.4MM (BIT) ×1
BIT DRILL 3.2XOCPTL (BIT) ×1 IMPLANT
BIT DRL 3.2XOCPTL (BIT) ×1
BLADE CLIPPER SURG (BLADE) ×3 IMPLANT
BLADE ULTRA TIP 2M (BLADE) IMPLANT
BUR MATCHSTICK NEURO 3.0 LAGG (BURR) ×3 IMPLANT
CANISTER SUCT 3000ML PPV (MISCELLANEOUS) ×3 IMPLANT
CARTRIDGE OIL MAESTRO DRILL (MISCELLANEOUS) ×1 IMPLANT
CLOSURE WOUND 1/2 X4 (GAUZE/BANDAGES/DRESSINGS)
CONNECTOR LAT CLOSED 10 (Connector) ×6 IMPLANT
CONNECTOR OC EYELET OFFSET (Connector) ×6 IMPLANT
CONNECTOR OC EYELET STANDARD (Connector) ×6 IMPLANT
CONNECTOR OC EYELET STD (Connector) ×2 IMPLANT
COVER WAND RF STERILE (DRAPES) IMPLANT
DECANTER SPIKE VIAL GLASS SM (MISCELLANEOUS) ×3 IMPLANT
DERMABOND ADVANCED (GAUZE/BANDAGES/DRESSINGS) ×2
DERMABOND ADVANCED .7 DNX12 (GAUZE/BANDAGES/DRESSINGS) ×1 IMPLANT
DIFFUSER DRILL AIR PNEUMATIC (MISCELLANEOUS) ×3 IMPLANT
DRAPE C-ARM 42X72 X-RAY (DRAPES) ×6 IMPLANT
DRAPE LAPAROTOMY 100X72 PEDS (DRAPES) ×3 IMPLANT
DRAPE MICROSCOPE LEICA (MISCELLANEOUS) IMPLANT
DRAPE POUCH INSTRU U-SHP 10X18 (DRAPES) IMPLANT
DRILL BIT (BIT) ×2
DRSG OPSITE 4X5.5 SM (GAUZE/BANDAGES/DRESSINGS) ×6 IMPLANT
DRSG OPSITE POSTOP 4X8 (GAUZE/BANDAGES/DRESSINGS) ×3 IMPLANT
DURAPREP 6ML APPLICATOR 50/CS (WOUND CARE) ×3 IMPLANT
ELECT REM PT RETURN 9FT ADLT (ELECTROSURGICAL) ×3
ELECTRODE REM PT RTRN 9FT ADLT (ELECTROSURGICAL) ×1 IMPLANT
FORCEPS BIPOLAR SPETZLER 8 1.0 (NEUROSURGERY SUPPLIES) ×3 IMPLANT
GAUZE 4X4 16PLY RFD (DISPOSABLE) IMPLANT
GAUZE SPONGE 4X4 12PLY STRL (GAUZE/BANDAGES/DRESSINGS) IMPLANT
GLOVE BIO SURGEON STRL SZ7 (GLOVE) IMPLANT
GLOVE BIO SURGEON STRL SZ7.5 (GLOVE) ×3 IMPLANT
GLOVE BIO SURGEON STRL SZ8 (GLOVE) ×3 IMPLANT
GLOVE BIOGEL PI IND STRL 6.5 (GLOVE) ×3 IMPLANT
GLOVE BIOGEL PI IND STRL 7.0 (GLOVE) IMPLANT
GLOVE BIOGEL PI IND STRL 7.5 (GLOVE) ×2 IMPLANT
GLOVE BIOGEL PI IND STRL 8 (GLOVE) ×2 IMPLANT
GLOVE BIOGEL PI IND STRL 8.5 (GLOVE) ×1 IMPLANT
GLOVE BIOGEL PI INDICATOR 6.5 (GLOVE) ×6
GLOVE BIOGEL PI INDICATOR 7.0 (GLOVE)
GLOVE BIOGEL PI INDICATOR 7.5 (GLOVE) ×4
GLOVE BIOGEL PI INDICATOR 8 (GLOVE) ×4
GLOVE BIOGEL PI INDICATOR 8.5 (GLOVE) ×2
GLOVE ECLIPSE 7.0 STRL STRAW (GLOVE) ×6 IMPLANT
GLOVE EXAM NITRILE XL STR (GLOVE) IMPLANT
GLOVE SURG SS PI 6.0 STRL IVOR (GLOVE) ×12 IMPLANT
GOWN STRL REUS W/ TWL LRG LVL3 (GOWN DISPOSABLE) IMPLANT
GOWN STRL REUS W/ TWL XL LVL3 (GOWN DISPOSABLE) ×1 IMPLANT
GOWN STRL REUS W/TWL 2XL LVL3 (GOWN DISPOSABLE) IMPLANT
GOWN STRL REUS W/TWL LRG LVL3 (GOWN DISPOSABLE)
GOWN STRL REUS W/TWL XL LVL3 (GOWN DISPOSABLE) ×2
GRAFT BN 5X1XSPNE CVD POST DBM (Bone Implant) ×1 IMPLANT
GRAFT BONE MAGNIFUSE 1X5CM (Bone Implant) ×2 IMPLANT
HEMOSTAT POWDER KIT SURGIFOAM (HEMOSTASIS) ×3 IMPLANT
HEMOSTAT POWDER SURGIFOAM 1G (HEMOSTASIS) IMPLANT
KIT BASIN OR (CUSTOM PROCEDURE TRAY) ×3 IMPLANT
KIT INFUSE SMALL (Orthopedic Implant) ×3 IMPLANT
KIT TURNOVER KIT B (KITS) ×3 IMPLANT
NEEDLE HYPO 25X1 1.5 SAFETY (NEEDLE) ×3 IMPLANT
NS IRRIG 1000ML POUR BTL (IV SOLUTION) ×3 IMPLANT
OIL CARTRIDGE MAESTRO DRILL (MISCELLANEOUS) ×3
PACK LAMINECTOMY NEURO (CUSTOM PROCEDURE TRAY) ×3 IMPLANT
PAD ARMBOARD 7.5X6 YLW CONV (MISCELLANEOUS) ×9 IMPLANT
PIN MAYFIELD SKULL DISP (PIN) ×3 IMPLANT
ROD OCCIPITOCRVICL CVD 3.5X200 (Rod) ×6 IMPLANT
SCREW 4.5X6MM OC (Screw) ×4 IMPLANT
SCREW BN 6X4.5XNS LF SPNE OC (Screw) ×2 IMPLANT
SCREW MA INFINITY 3.5X12 (Screw) ×12 IMPLANT
SCREW MULTI AXIAL 3.5X26 (Screw) ×6 IMPLANT
SCREW SET 3600315 STANDARD (Screw) ×18 IMPLANT
SCREW SET 3600315 STD (Screw) ×6 IMPLANT
SCREW SET OFFSET CONNECTOR (Miscellaneous) ×18 IMPLANT
SCREW TAPERED OC 4.5X8 (Screw) ×6 IMPLANT
SPONGE LAP 4X18 RFD (DISPOSABLE) IMPLANT
SPONGE SURGIFOAM ABS GEL SZ50 (HEMOSTASIS) ×3 IMPLANT
STAPLER SKIN PROX WIDE 3.9 (STAPLE) ×3 IMPLANT
STRIP CLOSURE SKIN 1/2X4 (GAUZE/BANDAGES/DRESSINGS) IMPLANT
SUT ETHILON 3 0 FSL (SUTURE) IMPLANT
SUT VIC AB 0 CT1 18XCR BRD8 (SUTURE) ×3 IMPLANT
SUT VIC AB 0 CT1 8-18 (SUTURE) ×6
SUT VIC AB 2-0 CT1 18 (SUTURE) ×3 IMPLANT
SUT VICRYL 3-0 RB1 18 ABS (SUTURE) ×6 IMPLANT
TOWEL GREEN STERILE (TOWEL DISPOSABLE) ×3 IMPLANT
TOWEL GREEN STERILE FF (TOWEL DISPOSABLE) ×3 IMPLANT
TRAY FOLEY MTR SLVR 16FR STAT (SET/KITS/TRAYS/PACK) ×3 IMPLANT
UNDERPAD 30X30 (UNDERPADS AND DIAPERS) ×3 IMPLANT
WATER STERILE IRR 1000ML POUR (IV SOLUTION) ×3 IMPLANT

## 2019-02-06 NOTE — Anesthesia Procedure Notes (Signed)
Arterial Line Insertion Start/End4/30/2020 11:10 AM, 02/06/2019 11:15 AM Performed by: Dorie Rank, CRNA  Patient location: Pre-op. Preanesthetic checklist: patient identified, IV checked, site marked, risks and benefits discussed, surgical consent, monitors and equipment checked, pre-op evaluation and anesthesia consent Left, radial was placed Catheter size: 20 G Hand hygiene performed , maximum sterile barriers used  and Seldinger technique used Allen's test indicative of satisfactory collateral circulation Attempts: 1 Procedure performed without using ultrasound guided technique. Ultrasound Notes:anatomy identified, needle tip was noted to be adjacent to the nerve/plexus identified and no ultrasound evidence of intravascular and/or intraneural injection Following insertion, Biopatch and dressing applied. Post procedure assessment: normal  Patient tolerated the procedure well with no immediate complications.

## 2019-02-06 NOTE — Progress Notes (Signed)
  NEUROSURGERY PROGRESS NOTE   No issues overnight. Cont to deny any significant neck pain.  EXAM:  BP (!) 172/78 (BP Location: Left Arm)   Pulse 75   Temp 98.2 F (36.8 C)   Resp 16   Ht 5\' 3"  (1.6 m)   Wt 65.8 kg   SpO2 97%   BMI 25.69 kg/m   Awake, alert, oriented  Speech fluent, appropriate  CN grossly intact  5/5 LUE x decreased finger extension and grip 3/5 RUE deltoid, 2/5 bicep, 3/5 tricep, minimal finger extension/grip 5/5 BLE  IMPRESSION:  85 y.o. female with displaced type II odontoid fracture and progressive myelopathy  PLAN: - Will proceed with operative decompression, possible instrumented stabilization/fusion.  I again reviewed the surgical plan with the patient. I told her that if after anesthesia it appears her fracture remains mobile we will likely need to instrument, however if the non-union appears relatively fixed we may rely on decompression alone. I did also tell her that if we proceed with decompression alone there is still the possibility of mobility at that segment postoperatively. She appeared to understand our discussion. All her questions this morning were answered and she is ready to proceed.  Of note, I carefully reviewed the CT/CTA including multi-plane reconstructions in radiology. It does not appear to me to be feasible to place C1 lateral mass screws because of the spondylolisthesis and the vertebral artery anatomy. Furthermore, it does not appear to be feasible to place standard C2 pedicle screws or transarticular screws again because of vertebral artery anatomy. C2 translaminar screws do appear to be an option.

## 2019-02-06 NOTE — Progress Notes (Signed)
Pt seen in PACU. C/O neck pain, otherwise well.  O/E: CN grossly intact 5/5 LUE 4/5 right shoulder abduction, 4/5 elbow flex/ext, 4/5 wrist ext, 4/5 grip 5/5 BLE  Pt appears to be somewhat improved esp with RUE strength compared to preop. Can return to floor, will cont to monitor.

## 2019-02-06 NOTE — Transfer of Care (Signed)
Immediate Anesthesia Transfer of Care Note  Patient: Denise Espinoza  Procedure(s) Performed: Cervical one LAMINECTOMY, Occiput-Cervical four fusion (N/A Neck)  Patient Location: PACU  Anesthesia Type:General  Level of Consciousness: awake, alert , oriented and patient cooperative  Airway & Oxygen Therapy: Patient Spontanous Breathing and Patient connected to face mask oxygen  Post-op Assessment: Report given to RN and Post -op Vital signs reviewed and stable  Post vital signs: Reviewed and stable  Last Vitals:  Vitals Value Taken Time  BP    Temp    Pulse 90 02/06/2019  4:39 PM  Resp 9 02/06/2019  4:39 PM  SpO2 100 % 02/06/2019  4:39 PM  Vitals shown include unvalidated device data.  Last Pain:  Vitals:   02/06/19 1052  TempSrc:   PainSc: 0-No pain         Complications: No apparent anesthesia complications

## 2019-02-06 NOTE — Anesthesia Preprocedure Evaluation (Addendum)
Anesthesia Evaluation  Patient identified by MRN, date of birth, ID band Patient awake    Reviewed: Allergy & Precautions, NPO status , Patient's Chart, lab work & pertinent test results  Airway Mallampati: II  TM Distance: >3 FB Neck ROM: Full    Dental  (+) Edentulous Upper, Edentulous Lower   Pulmonary neg pulmonary ROS,    Pulmonary exam normal breath sounds clear to auscultation       Cardiovascular Normal cardiovascular exam Rhythm:Regular Rate:Normal     Neuro/Psych non-healing displaced type II odontoid fracture with associated severe stenosis and progressive myelopathy negative psych ROS   GI/Hepatic negative GI ROS, Neg liver ROS,   Endo/Other  negative endocrine ROS  Renal/GU negative Renal ROS     Musculoskeletal  (+) Arthritis , Rheumatoid disorders,    Abdominal   Peds  Hematology HLD   Anesthesia Other Findings 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  Reproductive/Obstetrics                            Anesthesia Physical Anesthesia Plan  ASA: III  Anesthesia Plan: General   Post-op Pain Management:    Induction: Intravenous  PONV Risk Score and Plan: 3 and Ondansetron, Dexamethasone and Treatment may vary due to age or medical condition  Airway Management Planned: Oral ETT and Video Laryngoscope Planned  Additional Equipment: Arterial line  Intra-op Plan:   Post-operative Plan: Extubation in OR and Possible Post-op intubation/ventilation  Informed Consent: I have reviewed the patients History and Physical, chart, labs and discussed the procedure including the risks, benefits and alternatives for the proposed anesthesia with the patient or authorized representative who has indicated his/her understanding and acceptance.     Dental advisory given  Plan Discussed with: CRNA  Anesthesia Plan Comments:        Anesthesia Quick Evaluation

## 2019-02-06 NOTE — Anesthesia Postprocedure Evaluation (Signed)
Anesthesia Post Note  Patient: Denise Espinoza  Procedure(s) Performed: Cervical one LAMINECTOMY, Occiput-Cervical four fusion (N/A Neck)     Patient location during evaluation: PACU Anesthesia Type: General Level of consciousness: awake Pain management: pain level controlled Vital Signs Assessment: post-procedure vital signs reviewed and stable Respiratory status: spontaneous breathing, nonlabored ventilation, respiratory function stable and patient connected to nasal cannula oxygen Cardiovascular status: blood pressure returned to baseline and stable Postop Assessment: no apparent nausea or vomiting Anesthetic complications: no    Last Vitals:  Vitals:   02/06/19 1734 02/06/19 1749  BP: (!) 183/85 (!) 168/93  Pulse: 96 99  Resp: 17 13  Temp:  36.4 C  SpO2: 96% 97%    Last Pain:  Vitals:   02/06/19 1719  TempSrc:   PainSc: 8                  Mathilda Maguire P Yacqub Baston

## 2019-02-06 NOTE — Progress Notes (Signed)
Patient came back from S/X with indwelling foley cathter

## 2019-02-06 NOTE — Progress Notes (Signed)
Patient back from SX, resting comfortably, all iv's flushed and patient, patient in hard collar, honeycomb Dsg on back of neck dry and intact

## 2019-02-06 NOTE — Op Note (Signed)
NEUROSURGERY OPERATIVE NOTE   PREOP DIAGNOSIS:  1. Displaced Type II Odontoid Fracture   POSTOP DIAGNOSIS: Same  PROCEDURE: 1. C1 laminectomy for decompression of spinal cord 2. Open reduction internal fixation of C2 fracture 3. Posterior segmental instrumentation - Occiput to C4 including: a. 4 occipital screws b. Bilateral C2 translaminar screws c. Bilateral lateral mass screws C3, C4 4.   Posterolatera fusion, Occiput to C4 5.   Use of non-structural bone allograft - 5cm Magnifuse x2, Small InFuse  SURGEON: Dr. Lisbeth Renshaw, MD  ASSISTANT: Dr. Maeola Harman, MD  ANESTHESIA: General Endotracheal  EBL: 100cc  SPECIMENS: None  DRAINS: None  COMPLICATIONS: None immediate  CONDITION: Hemodynamically stable to PACU  HISTORY: Denise Espinoza is a 85 y.o. female initially presenting to the emergency department with progressive right greater than left arm weakness, and gait instability.  Her initial imaging included MRI which demonstrated a significantly displaced type II odontoid fracture.  CT scan was also done which demonstrated nonunion of the odontoid fracture.  With her significant progressive neurologic decline related to the significant stenosis at the level of C1 due to the displacement, operative treatment was indicated.  The risks and benefits of the surgery were reviewed in detail with the patient.  After all her questions were answered, informed consent was obtained and witnessed.  PROCEDURE IN DETAIL: The patient was brought to the operating room via stretcher. After induction of general anesthesia, the patient was positioned on the operative table in the [place] head holder in the prone position. All pressure points were meticulously padded.  Lateral fluoroscopy was then brought in, and the patient's positioning was initially checked.  I then applied gentle traction with slight extension, and noted the C1-C2 segment to be mobile, and I was able to significantly  reduce the fracture.  The Mayfield head holder was then secured to the table with the fracture significantly reduced and better aligned.  Posterior midline skin incision was then marked out and prepped and draped in the usual sterile fashion.  After timeout was conducted, the midline incision was infiltrated with local anesthetic with epinephrine.  Incision was then made sharply and carried down through the subcutaneous tissue.  The nuchal fascia was then identified and incised in a Y-shaped.  Suboccipital musculature was then divided in the avascular midline plane, and the spinous processes at C5, C4, C3, C2, and C1 were identified.  The base of the occipital bone was also identified and dissected in the subperiosteal plane.  Lamina at the upper cervical levels was also dissected in the subperiosteal plane to the level of the lateral portion of the facet complexes.  Self-retaining retractors were then placed.  The C1 ring was noted to be in much more normal position as compared to the preoperative CT scan.  At this point, utilizing the high-speed drill, complete C1 laminectomy was completed.  The ligamentum flavum was identified and removed with Kerrison punches.  The thecal sac was noted to be well decompressed.  At this point attention was turned to placement of the posterior instrumentation.  Initially, pilot holes for lateral mass screws bilaterally were identified using standard anatomic landmarks at C3 and C4.  Pilot holes were then drilled, and tapped.  12 mm x 3.5 mm screws were then placed bilaterally at C3 and C4.  Entry point was then identified for translaminar screw at C2.  This was drilled to a depth of 25 mm.  Sound was used to ensure good trajectory within the lamina of C2.  Pilot hole was then tapped, and 26 mm x 3.5 mm screw was placed on the right side.  In a similar fashion, entry point for the left-sided translaminar screw was identified.  Pilot holes were then drilled and tapped, and  screw was placed.  A pre-bent occipitocervical rod was then cut to size, and inserted.  An offset connector was used to connect the C2 translaminar screw to the rod.  Occipital fasteners were also connected to the superior portion of the rod.  Occipital screws were then drilled and tapped on the patient's right side to a depth of 8 mm.  Setscrews were then final tightened on the right.  In a similar fashion, the left-sided occipitocervical rod was cut to size.  Offset connector was placed and secured to the C2 laminar screw.  Occipital fasteners were then drilled and tapped.  8 mm was placed superiorly, with a 6 mm screw placed inferiorly on the patient's left side.  The setscrews and occipital screws were then final tightened.  Final AP and lateral fluoroscopic images were taken which demonstrated good position of the implanted hardware, with good occipitocervical alignment.  At this point, attention was turned to arthrodesis.  The high-speed drill was used to decorticate the inferior portion of the occipital bone, as well as the lamina and facet complex at C2, C3, and C4.  BMP was then placed across the decorticated bone surfaces.  Magna fuse was also placed over the BMP.  At this point, the wound was irrigated with copious amounts of normal saline irrigation.  Self-retaining retractor was removed.  The wound was closed in multiple layers in standard fashion using combination of interrupted 0 and 3-0 Vicryl stitches.  Dermabond was applied.  Once Dermabond had completely dried, sterile dressing was placed.  The patient was then transferred to the bed and the Mayfield head holder was removed.  She was placed again in the Aspen cervical collar.  She was then extubated and taken to the postanesthesia care unit in stable hemodynamic condition.  At the end of the case all sponge, needle, instrument, and cottonoid counts were correct.

## 2019-02-06 NOTE — Anesthesia Procedure Notes (Signed)
Procedure Name: Intubation Date/Time: 02/06/2019 11:47 AM Performed by: Ponciano Ort, CRNA Pre-anesthesia Checklist: Patient identified, Emergency Drugs available, Suction available and Patient being monitored Patient Re-evaluated:Patient Re-evaluated prior to induction Oxygen Delivery Method: Circle system utilized Preoxygenation: Pre-oxygenation with 100% oxygen Induction Type: IV induction and Rapid sequence Laryngoscope Size: Glidescope and 4 Grade View: Grade I Tube type: Oral Tube size: 7.0 mm Number of attempts: 1 Airway Equipment and Method: Stylet and Video-laryngoscopy (C- collar on; no neck manipulation for intubation.) Placement Confirmation: ETT inserted through vocal cords under direct vision,  positive ETCO2 and breath sounds checked- equal and bilateral Secured at: 21 cm Tube secured with: Tape Dental Injury: Teeth and Oropharynx as per pre-operative assessment

## 2019-02-07 ENCOUNTER — Encounter (HOSPITAL_COMMUNITY): Payer: Self-pay | Admitting: Neurosurgery

## 2019-02-07 LAB — BASIC METABOLIC PANEL
Anion gap: 10 (ref 5–15)
BUN: 17 mg/dL (ref 8–23)
CO2: 24 mmol/L (ref 22–32)
Calcium: 8.8 mg/dL — ABNORMAL LOW (ref 8.9–10.3)
Chloride: 101 mmol/L (ref 98–111)
Creatinine, Ser: 0.68 mg/dL (ref 0.44–1.00)
GFR calc Af Amer: >60 mL/min (ref >60)
GFR calc non Af Amer: >60 mL/min (ref >60)
Glucose, Bld: 149 mg/dL — ABNORMAL HIGH (ref 70–99)
Potassium: 4.3 mmol/L (ref 3.5–5.1)
Sodium: 135 mmol/L (ref 135–145)

## 2019-02-07 LAB — CBC
HCT: 34.8 % — ABNORMAL LOW (ref 36.0–46.0)
Hemoglobin: 11.6 g/dL — ABNORMAL LOW (ref 12.0–15.0)
MCH: 30.3 pg (ref 26.0–34.0)
MCHC: 33.3 g/dL (ref 30.0–36.0)
MCV: 90.9 fL (ref 80.0–100.0)
Platelets: 172 10*3/uL (ref 150–400)
RBC: 3.83 MIL/uL — ABNORMAL LOW (ref 3.87–5.11)
RDW: 12.6 % (ref 11.5–15.5)
WBC: 10.9 10*3/uL — ABNORMAL HIGH (ref 4.0–10.5)
nRBC: 0 % (ref 0.0–0.2)

## 2019-02-07 LAB — PROTIME-INR
INR: 1.2 (ref 0.8–1.2)
Prothrombin Time: 15 seconds (ref 11.4–15.2)

## 2019-02-07 LAB — APTT: aPTT: 28 seconds (ref 24–36)

## 2019-02-07 NOTE — Progress Notes (Signed)
Patient walked in a room 10-12 ft at 6am , c/o feeling weak, feeling pain on neck site , seems dizzy; so took her back to bed; prn med provided for pain relieve, resting in a bed at this time, no s/s of distress noted; will continue to monitor closely.

## 2019-02-07 NOTE — Evaluation (Signed)
Physical Therapy Evaluation Patient Details Name: Denise MullinSaralyn Espinoza MRN: [id number] DOB: 1930/03/20 Today's Date: 02/07/2019   History of Present Illness  Pt is an 85 y/o female s/p ORIF C2 fx, C1 laminectomy for decompression of spinal cord and posterolateral fusion from occiput to C4. PMH including but not limited to HTN and HLD.    Clinical Impression  Pt presented supine in bed with HOB elevated, awake and willing to participate in therapy session. Prior to admission, pt reported that she was very independent with all functional mobility, ADLs and IADLs. Pt lives with children and other family members in a single level home with a level entry. At the time of evaluation, pt overall at min guard to min A for mobility, ambulating a short distance within her room with RW. Pt limited secondary to pain. Pt would continue to benefit from skilled physical therapy services at this time while admitted and after d/c to address the below listed limitations in order to improve overall safety and independence with functional mobility.     Follow Up Recommendations Home health PT;Supervision/Assistance - 24 hour    Equipment Recommendations  None recommended by PT    Recommendations for Other Services       Precautions / Restrictions Precautions Precautions: Fall;Cervical Required Braces or Orthoses: Cervical Brace Cervical Brace: Hard collar Restrictions Weight Bearing Restrictions: No      Mobility  Bed Mobility Overal bed mobility: Needs Assistance Bed Mobility: Rolling;Sidelying to Sit Rolling: Min guard Sidelying to sit: Min assist       General bed mobility comments: increased time and effort, cueing for log roll technique, use of bed rails, min A to elevate trunk  Transfers Overall transfer level: Needs assistance Equipment used: Rolling walker (2 wheeled) Transfers: Sit to/from Stand Sit to Stand: Min assist         General transfer comment: cueing for safe hand placement,  bed in standard position, min A to power into full standing and for stability with transition  Ambulation/Gait Ambulation/Gait assistance: Min guard;Min assist Gait Distance (Feet): 15 Feet Assistive device: Rolling walker (2 wheeled) Gait Pattern/deviations: Step-through pattern;Decreased stride length;Trunk flexed Gait velocity: decreased   General Gait Details: pt required occasional cueing to min A to maintain proximity to RW and safety with navigating around obstacles in room  Stairs            Wheelchair Mobility    Modified Rankin (Stroke Patients Only)       Balance Overall balance assessment: Needs assistance Sitting-balance support: Feet supported Sitting balance-Leahy Scale: Fair     Standing balance support: During functional activity;Bilateral upper extremity supported Standing balance-Leahy Scale: Poor                               Pertinent Vitals/Pain Pain Assessment: 0-10 Pain Score: 9  Pain Location: neck Pain Descriptors / Indicators: Throbbing;Sore Pain Intervention(s): Monitored during session;Repositioned    Home Living Family/patient expects to be discharged to:: Private residence Living Arrangements: Children;Other relatives Available Help at Discharge: Family;Available 24 hours/day Type of Home: House Home Access: Level entry     Home Layout: Other (Comment)(two steps (with rail) to dining room/"sitting room") Home Equipment: Shower seat;Cane - single point;Walker - 2 wheels      Prior Function Level of Independence: Independent         Comments: drives, enjoys walking     Hand Dominance   Dominant Hand: Right  Extremity/Trunk Assessment   Upper Extremity Assessment Upper Extremity Assessment: Defer to OT evaluation    Lower Extremity Assessment Lower Extremity Assessment: Generalized weakness    Cervical / Trunk Assessment Cervical / Trunk Assessment: Kyphotic;Other exceptions Cervical / Trunk  Exceptions: s/p cervical sx  Communication   Communication: No difficulties  Cognition Arousal/Alertness: Awake/alert Behavior During Therapy: WFL for tasks assessed/performed Overall Cognitive Status: No family/caregiver present to determine baseline cognitive functioning Area of Impairment: Safety/judgement;Problem solving                         Safety/Judgement: Decreased awareness of deficits   Problem Solving: Difficulty sequencing;Requires tactile cues;Requires verbal cues        General Comments      Exercises     Assessment/Plan    PT Assessment Patient needs continued PT services  PT Problem List Decreased strength;Decreased activity tolerance;Decreased balance;Decreased mobility;Decreased coordination;Decreased knowledge of use of DME;Decreased safety awareness;Decreased knowledge of precautions;Pain       PT Treatment Interventions Stair training;DME instruction;Gait training;Functional mobility training;Therapeutic activities;Therapeutic exercise;Balance training;Neuromuscular re-education;Cognitive remediation;Patient/family education    PT Goals (Current goals can be found in the Care Plan section)  Acute Rehab PT Goals Patient Stated Goal: decrease pain, return to independence PT Goal Formulation: With patient Time For Goal Achievement: 02/21/19 Potential to Achieve Goals: Good    Frequency Min 5X/week   Barriers to discharge        Co-evaluation               AM-PAC PT "6 Clicks" Mobility  Outcome Measure Help needed turning from your back to your side while in a flat bed without using bedrails?: A Little Help needed moving from lying on your back to sitting on the side of a flat bed without using bedrails?: A Little Help needed moving to and from a bed to a chair (including a wheelchair)?: A Little Help needed standing up from a chair using your arms (e.g., wheelchair or bedside chair)?: A Little Help needed to walk in hospital  room?: A Little Help needed climbing 3-5 steps with a railing? : A Little 6 Click Score: 18    End of Session Equipment Utilized During Treatment: Gait belt;Cervical collar Activity Tolerance: Patient limited by pain Patient left: in chair;with call bell/phone within reach Nurse Communication: Mobility status PT Visit Diagnosis: Other abnormalities of gait and mobility (R26.89);Pain Pain - part of body: (neck)    Time: 8299-3716 PT Time Calculation (min) (ACUTE ONLY): 25 min   Charges:   PT Evaluation $PT Eval Moderate Complexity: 1 Mod PT Treatments $Therapeutic Activity: 8-22 mins        Deborah Chalk, PT, DPT  Acute Rehabilitation Services Pager [phone] Office [phone]    Denise Espinoza 02/07/2019, 11:29 AM

## 2019-02-07 NOTE — Progress Notes (Signed)
Orthopedic Tech Progress Note Patient Details:  Denise Espinoza 03/29/1930 [id number] RN said patient has on hard collar Patient ID: Denise Espinoza, female   DOB: 09-Feb-1930, 85 y.o.   MRN: [id number]   [name] 02/07/2019, 11:32 AM

## 2019-02-07 NOTE — TOC Progression Note (Signed)
Transition of Care [facility]) - Progression Note    Patient Details  Name: Denise Espinoza MRN: [id number] Date of Birth: [birth year]  [facility] CM/SW Contact  Kermit Balo, RN Phone Number: 02/07/2019, 2:52 PM  Clinical Narrative:    Recommendations are for [facility]. Pt states she has assistance at home. MD please place orders for [facility] services. CM following.   Expected Discharge Plan: Home w Home Health Services Barriers to Discharge: Continued Medical Work up  Expected Discharge Plan and Services Expected Discharge Plan: Home w Home Health Services     Post Acute Care Choice: Home Health Living arrangements for the past 2 months: Single Family Home(one level home with basement)                             HH Agency: [facility] Date [facility] Agency Contacted: 02/07/19 Time HH Agency Contacted: [phone] Representative spoke with at [facility] Agency: Elnita Maxwell   Social Determinants of Health (SDOH) Interventions    Readmission Risk Interventions No flowsheet data found.

## 2019-02-07 NOTE — Evaluation (Signed)
Occupational Therapy Evaluation Patient Details Name: Denise Espinoza MRN: [id number] DOB: [birth year] Today's Date: 02/07/2019    History of Present Illness 85 y/o female s/p ORIF C2 fx, C1 laminectomy for decompression of spinal cord and posterolateral fusion from occiput to C4. PMH including but not limited to HTN and HLD.   Clinical Impression   Patient is s/p ORIF C2 fx surgery resulting in functional limitations due to the deficits listed below (see OT problem list). Pt with strong R lean in chair and nauseate on arrival. Pt return to supine min (A) for RW and mod (A) for bed mobility. Pt reports having family (A) upon d/c home.  Patient will benefit from skilled OT acutely to increase independence and safety with ADLS to allow discharge HHOT.     Follow Up Recommendations  Home health OT    Equipment Recommendations  3 in 1 bedside commode    Recommendations for Other Services       Precautions / Restrictions Precautions Precautions: Fall;Cervical Precaution Comments: reviewed ccollar with patient for proper fit Required Braces or Orthoses: Cervical Brace Cervical Brace: Hard collar Restrictions Weight Bearing Restrictions: No      Mobility Bed Mobility Overal bed mobility: Needs Assistance Bed Mobility: Sit to Supine       Sit to supine: Mod assist   General bed mobility comments: pt with incr and time and (A) to lift bil LE into the bed. pt education on proper sequence to maintain cervical precautions  Transfers Overall transfer level: Needs assistance Equipment used: Rolling walker (2 wheeled) Transfers: Sit to/from Stand Sit to Stand: Min assist         General transfer comment: cueing for safe hand placement,     Balance Overall balance assessment: Needs assistance Sitting-balance support: Feet supported Sitting balance-Leahy Scale: Fair     Standing balance support: During functional activity;Bilateral upper extremity supported Standing  balance-Leahy Scale: Poor                             ADL either performed or assessed with clinical judgement   ADL Overall ADL's : Needs assistance/impaired Eating/Feeding: Moderate assistance;Sitting   Grooming: Moderate assistance   Upper Body Bathing: Moderate assistance   Lower Body Bathing: Maximal assistance   Upper Body Dressing : Moderate assistance   Lower Body Dressing: Maximal assistance   Toilet Transfer: Minimal assistance           Functional mobility during ADLs: Minimal assistance;Rolling walker General ADL Comments: pt is unable to sustain grasp on RW with R UE and pt unable to hold the blue baskin present for nausea. pt asking fo rmedication from RN this session for nausea     Vision         Perception     Praxis      Pertinent Vitals/Pain Pain Assessment: 0-10 Pain Score: 6  Pain Location: neck Pain Descriptors / Indicators: Sore;Discomfort;Grimacing;Guarding Pain Intervention(s): Monitored during session;Premedicated before session;Repositioned     Hand Dominance Right   Extremity/Trunk Assessment Upper Extremity Assessment Upper Extremity Assessment: RUE deficits/detail;LUE deficits/detail RUE Deficits / Details: ulnar deviation of arthritis hands with decr grasp. pt unable to sustain grasp on RW this session. pt able to follow flexion extension and abduction exercise LUE Deficits / Details: arthritis noted and greater grip strength than R hand.        Cervical / Trunk Assessment Cervical / Trunk Assessment: Kyphotic;Other exceptions Cervical / Trunk Exceptions: s/p  cervical sx   Communication Communication Communication: No difficulties   Cognition Arousal/Alertness: Awake/alert Behavior During Therapy: WFL for tasks assessed/performed Overall Cognitive Status: No family/caregiver present to determine baseline cognitive functioning Area of Impairment: Safety/judgement;Problem solving                          Safety/Judgement: Decreased awareness of deficits   Problem Solving: Difficulty sequencing;Requires tactile cues;Requires verbal cues     General Comments       Exercises     Shoulder Instructions      Home Living Family/patient expects to be discharged to:: Private residence Living Arrangements: Children;Other relatives Available Help at Discharge: Family;Available 24 hours/day Type of Home: House Home Access: Level entry     Home Layout: Other (Comment)(two steps (with rail) to dining room/"sitting room")     Bathroom Shower/Tub: Walk-in shower         Home Equipment: Shower seat;Cane - single point;Walker - 2 wheels   Additional Comments: has son and daughter to (A)      Prior Functioning/Environment Level of Independence: Independent        Comments: drives, enjoys walking        OT Problem List: Decreased strength;Decreased range of motion;Decreased activity tolerance;Impaired balance (sitting and/or standing);Decreased coordination;Decreased safety awareness;Decreased knowledge of use of DME or AE;Impaired UE functional use      OT Treatment/Interventions: Self-care/ADL training;Therapeutic exercise;Neuromuscular education;Energy conservation;DME and/or AE instruction;Manual therapy;Modalities;Therapeutic activities;Balance training;Patient/family education    OT Goals(Current goals can be found in the care plan section) Acute Rehab OT Goals Patient Stated Goal: decrease pain, return to independence OT Goal Formulation: With patient Time For Goal Achievement: 02/21/19 Potential to Achieve Goals: Good  OT Frequency: Min 3X/week   Barriers to D/C:            Co-evaluation              AM-PAC OT "6 Clicks" Daily Activity     Outcome Measure Help from another person eating meals?: A Lot Help from another person taking care of personal grooming?: A Lot Help from another person toileting, which includes using toliet, bedpan, or urinal?: A  Lot Help from another person bathing (including washing, rinsing, drying)?: A Lot Help from another person to put on and taking off regular upper body clothing?: A Lot Help from another person to put on and taking off regular lower body clothing?: A Lot 6 Click Score: 12   End of Session Equipment Utilized During Treatment: Gait belt;Rolling walker Nurse Communication: Mobility status;Precautions  Activity Tolerance: Patient tolerated treatment well Patient left: in bed;with call bell/phone within reach;with bed alarm set  OT Visit Diagnosis: Unsteadiness on feet (R26.81);Muscle weakness (generalized) (M62.81)                Time: 8127-5170 OT Time Calculation (min): 11 min Charges:  OT General Charges $OT Visit: 1 Visit OT Evaluation $OT Eval Moderate Complexity: 1 Mod   Mateo Flow, OTR/L  Acute Rehabilitation Services Pager: [phone] Office: [phone] .   Mateo Flow 02/07/2019, 3:06 PM

## 2019-02-07 NOTE — Progress Notes (Signed)
At  0600 am foley catheter is removed  by nurse per ordered, pt tolerated procedure well, initiated post foley removal  Interventions, and  will continue to monitor closely.

## 2019-02-07 NOTE — Care Management Important Message (Signed)
Important Message  Patient Details  Name: Denise Espinoza MRN: [id number] Date of Birth: August 15, 1930   Medicare Important Message Given:  Yes    Rossi Silvestro 02/07/2019, 4:03 PM

## 2019-02-07 NOTE — Progress Notes (Signed)
  NEUROSURGERY PROGRESS NOTE   Patient fell this am. Reports feeling dizzy but blames it on medication. No serious injury. Complains of appropriate posterior neck soreness. Notes immediate improving in N/T and strength in BUE  EXAM:  BP (!) 144/72 (BP Location: Right Leg)   Pulse 81   Temp 97.6 F (36.4 C) (Oral)   Resp 20   Ht 5\' 3"  (1.6 m)   Wt 65.8 kg   SpO2 100%   BMI 25.69 kg/m   Awake, alert, oriented  Speech fluent, appropriate  CN grossly intact  4-/5 RUE, 5/5 LUE, 5/5 BLE Incision c/d/i  IMPRESSION/PLAN 85 y.o. female pod#1 s/p cervical decompression and fusion. Improving neurologically. - Continue supportive care - PT/OT today - Dizziness: post op labs are normal. ?medication side effect. Hold sedating meds as possible.  Cindra Presume, PA-C [facility]

## 2019-02-08 MED ORDER — LIP MEDEX EX OINT
TOPICAL_OINTMENT | CUTANEOUS | Status: DC | PRN
  Filled 2019-02-08: qty 7

## 2019-02-08 NOTE — Progress Notes (Addendum)
Physical Therapy Treatment Patient Details Name: Denise Espinoza MRN: [id number] DOB: 02-Feb-1930 Today's Date: 02/08/2019    History of Present Illness 85 y/o female s/p ORIF C2 fx, C1 laminectomy for decompression of spinal cord and posterolateral fusion from occiput to C4. PMH including but not limited to HTN and HLD.    PT Comments    Pt reporting nausea and severe pain level upon PT arrival. Pt unable to ambulate this session due to this, but participated in seated balance activities and bed mobility/transfer. Pt requires min-mod assist for mobility tasks, and reports she wishes she could do more but states today is her worst day yet post-surgery. Pt also reporting numbness in plantar surface of feet, and numbness/tingling in UEs R>L. PT to continue to follow and progress mobility as able.   Follow Up Recommendations  Home health PT;Supervision/Assistance - 24 hour     Equipment Recommendations  None recommended by PT    Recommendations for Other Services       Precautions / Restrictions Precautions Precautions: Fall;Cervical Precaution Comments: cervical collar on at all times  Required Braces or Orthoses: Cervical Brace Cervical Brace: Hard collar Restrictions Weight Bearing Restrictions: No    Mobility  Bed Mobility Overal bed mobility: Needs Assistance Bed Mobility: Sit to Supine       Sit to supine: Mod assist   General bed mobility comments: Mod assist for supine<>sit for trunk elevation, LE mobility, and using UEs to prop once at EOB. Pt sat EOB ~10 minutes, verbal cuing for   Transfers Overall transfer level: Needs assistance Equipment used: Rolling walker (2 wheeled) Transfers: Sit to/from Stand Sit to Stand: Mod assist         General transfer comment: Mod assist for power up, hip extension, and steadying upon standing. PT encouraged pre-gait activity of marching to determine if pt was appropriate for ambulation this session, but pt with buckling and  trunk flexion with fatigue. Pt returned to sitting position on EOB, verbal cuing for hand placement.   Ambulation/Gait Ambulation/Gait assistance: (NT - pt with nausea and pain limiting her)               Stairs             Wheelchair Mobility    Modified Rankin (Stroke Patients Only)       Balance Overall balance assessment: Needs assistance Sitting-balance support: Feet supported Sitting balance-Leahy Scale: Fair     Standing balance support: During functional activity;Bilateral upper extremity supported Standing balance-Leahy Scale: Poor                              Cognition Arousal/Alertness: Awake/alert Behavior During Therapy: WFL for tasks assessed/performed Overall Cognitive Status: No family/caregiver present to determine baseline cognitive functioning Area of Impairment: Safety/judgement;Problem solving                         Safety/Judgement: Decreased awareness of safety   Problem Solving: Difficulty sequencing;Requires tactile cues;Requires verbal cues;Slow processing General Comments: Pt requiring increased time and multimodal cues to perform mobility tasks      Exercises General Exercises - Lower Extremity Long Arc Quad: AROM;Both;10 reps;Seated Hip Flexion/Marching: AROM;Both;10 reps;Seated;Standing    General Comments        Pertinent Vitals/Pain Pain Assessment: 0-10 Pain Score: 7  Pain Location: neck Pain Descriptors / Indicators: Aching;Operative site guarding;Grimacing Pain Intervention(s): Monitored during session;Repositioned;Limited activity within patient's tolerance;Premedicated  before session    Home Living                      Prior Function            PT Goals (current goals can now be found in the care plan section) Acute Rehab PT Goals Patient Stated Goal: decrease pain, return to independence PT Goal Formulation: With patient Time For Goal Achievement: 02/21/19 Potential to  Achieve Goals: Good Progress towards PT goals: Progressing toward goals    Frequency    Min 5X/week      PT Plan Current plan remains appropriate    Co-evaluation              AM-PAC PT "6 Clicks" Mobility   Outcome Measure  Help needed turning from your back to your side while in a flat bed without using bedrails?: A Lot Help needed moving from lying on your back to sitting on the side of a flat bed without using bedrails?: A Lot Help needed moving to and from a bed to a chair (including a wheelchair)?: A Little Help needed standing up from a chair using your arms (e.g., wheelchair or bedside chair)?: A Little Help needed to walk in hospital room?: A Little Help needed climbing 3-5 steps with a railing? : A Little 6 Click Score: 16    End of Session Equipment Utilized During Treatment: Gait belt;Cervical collar Activity Tolerance: Patient limited by pain;Patient limited by fatigue;Other (comment)(nausea) Patient left: with call bell/phone within reach;in bed;with bed alarm set;with SCD's reapplied Nurse Communication: Mobility status PT Visit Diagnosis: Other abnormalities of gait and mobility (R26.89);Pain Pain - part of body: (neck)     Time: 1124-1140 PT Time Calculation (min) (ACUTE ONLY): 16 min  Charges:  $Therapeutic Activity: 8-22 mins                    Dominik Lauricella Terrial Rhodes, PT Acute Rehabilitation Services Pager [phone]  Office [phone]   Careen Mauch D Despina Hidden 02/08/2019, 3:19 PM

## 2019-02-08 NOTE — Progress Notes (Signed)
Occupational Therapy Treatment Patient Details Name: Denise Espinoza MRN: [id number] DOB: [birth year] Today's Date: 02/08/2019    History of present illness 85 y/o female s/p ORIF C2 fx, C1 laminectomy for decompression of spinal cord and posterolateral fusion from occiput to C4. PMH including but not limited to HTN and HLD.   OT comments  Worked with pt using AE for simulated self feeding, and worked with her with potential adaptations and adjustments of settings to increase her ability to use phone - she continues to have significant difficulty with this task.  Pt c/o significant pain today.   If she doesn't improve tomorrow, may need to reconsider disposition, as she may need post acute rehab prior to home.   Follow Up Recommendations  Home health OT    Equipment Recommendations  3 in 1 bedside commode    Recommendations for Other Services      Precautions / Restrictions Precautions Precautions: Fall;Cervical Precaution Comments: cervical collar on at all times  Required Braces or Orthoses: Cervical Brace Cervical Brace: Hard collar Restrictions Weight Bearing Restrictions: No       Mobility Bed Mobility Overal bed mobility: Needs Assistance Bed Mobility: Sit to Supine       Sit to supine: Mod assist   General bed mobility comments: Mod assist for supine<>sit for trunk elevation, LE mobility, and using UEs to prop once at EOB. Pt sat EOB ~10 minutes, verbal cuing for   Transfers Overall transfer level: Needs assistance Equipment used: Rolling walker (2 wheeled) Transfers: Sit to/from Stand Sit to Stand: Mod assist         General transfer comment: Mod assist for power up, hip extension, and steadying upon standing. PT encouraged pre-gait activity of marching to determine if pt was appropriate for ambulation this session, but pt with buckling and trunk flexion with fatigue. Pt returned to sitting position on EOB, verbal cuing for hand placement.     Balance Overall  balance assessment: Needs assistance Sitting-balance support: Feet supported Sitting balance-Leahy Scale: Fair     Standing balance support: During functional activity;Bilateral upper extremity supported Standing balance-Leahy Scale: Poor                             ADL either performed or assessed with clinical judgement   ADL                                               Vision       Perception     Praxis      Cognition Arousal/Alertness: Awake/alert Behavior During Therapy: WFL for tasks assessed/performed Overall Cognitive Status: No family/caregiver present to determine baseline cognitive functioning                                 General Comments: Pt slow to respond.   Requires increased time to complete tasks, but this may be due to increased pain, cervical collar which limits ability to look down, and due to sensory loss         Exercises Exercises: Other exercises General Exercises - Lower Extremity Long Arc Quad: AROM;Both;10 reps;Seated Hip Flexion/Marching: AROM;Both;10 reps;Seated;Standing Other Exercises Other Exercises: Worked on adaptations in attempt to allow pt to use her phone.  Attempted u-cuff with  pencil to act as a stylus, but her phone does not respond to the pencil eraser.  Adjusted her accessibilty settings.  With practice, she is able to swipe phone on, but still with difficulty targeting icons  Other Exercises: Pt provided with adapted feeding utensils.  With simulation, she is able to bring fork to mouth repeatedly with elbow propped on folded blankets/towels    Shoulder Instructions       General Comments      Pertinent Vitals/ Pain       Pain Assessment: Faces Faces Pain Scale: Hurts even more Pain Location: neck Pain Descriptors / Indicators: Aching;Operative site guarding;Grimacing Pain Intervention(s): Monitored during session;Repositioned  Home Living                                           Prior Functioning/Environment              Frequency  Min 3X/week        Progress Toward Goals  OT Goals(current goals can now be found in the care plan section)  Progress towards OT goals: Progressing toward goals  Acute Rehab OT Goals Patient Stated Goal: decrease pain, return to independence  Plan Discharge plan remains appropriate    Co-evaluation                 AM-PAC OT "6 Clicks" Daily Activity     Outcome Measure   Help from another person eating meals?: A Lot Help from another person taking care of personal grooming?: A Lot Help from another person toileting, which includes using toliet, bedpan, or urinal?: A Lot Help from another person bathing (including washing, rinsing, drying)?: A Lot Help from another person to put on and taking off regular upper body clothing?: Total Help from another person to put on and taking off regular lower body clothing?: Total 6 Click Score: 10    End of Session Equipment Utilized During Treatment: Cervical collar  OT Visit Diagnosis: Unsteadiness on feet (R26.81);Muscle weakness (generalized) (M62.81)   Activity Tolerance Patient limited by fatigue   Patient Left in bed;with call bell/phone within reach;with nursing/sitter in room   Nurse Communication Mobility status        Time: 7471-5953 OT Time Calculation (min): 28 min  Charges: OT General Charges $OT Visit: 1 Visit OT Treatments $Self Care/Home Management : 23-37 mins  Jeani Hawking, OTR/L Acute Rehabilitation Services Pager [phone] Office [phone]'   Jeani Hawking M 02/08/2019, 6:26 PM

## 2019-02-08 NOTE — Progress Notes (Signed)
Subjective: Patient reports Overall patient says she is doing well limited neck pain increased strength left upper extremity  Objective: Vital signs in last 24 hours: Temp:  [97.7 F (36.5 C)-98.5 F (36.9 C)] 98.5 F (36.9 C) (05/02 0757) Pulse Rate:  [81-89] 88 (05/02 0757) Resp:  [16-19] 16 (05/02 0757) BP: (161-173)/(69-86) 163/80 (05/02 0757) SpO2:  [93 %-99 %] 93 % (05/02 0757)  Intake/Output from previous day: 05/01 0701 - 05/02 0700 In: -  Out: 2025 [Urine:2025] Intake/Output this shift: No intake/output data recorded.  Neurologically improving has antigravity strength left upper extremity cannot lift her right arm overhead incision clean dry and intact  Lab Results: Recent Labs    02/07/19 0324  WBC 10.9*  HGB 11.6*  HCT 34.8*  PLT 172   BMET Recent Labs    02/07/19 0324  NA 135  K 4.3  CL 101  CO2 24  GLUCOSE 149*  BUN 17  CREATININE 0.68  CALCIUM 8.8*    Studies/Results: Dg Cervical Spine 2-3 Views  Result Date: 02/06/2019 CLINICAL DATA:  Laminectomy and occiput to C4 fusion. EXAM: DG C-ARM 61-120 MIN; CERVICAL SPINE - 2-3 VIEW COMPARISON:  CT 02/04/2019 FINDINGS: Two C-arm images show C1 laminectomy with occiput to C4 posterior fusion. Screws and rods appear well positioned based on these images. There is restoration of proper alignment of C1 relative to C2. IMPRESSION: Restoration of proper C1-2 alignment following occiput to C4 fusion. Electronically Signed   By: Paulina Fusi M.D.   On: 02/06/2019 15:52   Dg C-arm 1-60 Min  Result Date: 02/06/2019 CLINICAL DATA:  Laminectomy and occiput to C4 fusion. EXAM: DG C-ARM 61-120 MIN; CERVICAL SPINE - 2-3 VIEW COMPARISON:  CT 02/04/2019 FINDINGS: Two C-arm images show C1 laminectomy with occiput to C4 posterior fusion. Screws and rods appear well positioned based on these images. There is restoration of proper alignment of C1 relative to C2. IMPRESSION: Restoration of proper C1-2 alignment following occiput  to C4 fusion. Electronically Signed   By: Paulina Fusi M.D.   On: 02/06/2019 15:52    Assessment/Plan: Patient doing well status post occipital cervical fusion continued mobilization with physical and Occupational Therapy c-collar at all times  LOS: 4 days     Denise Espinoza P 02/08/2019, 9:13 AM

## 2019-02-08 NOTE — Progress Notes (Addendum)
Occupational Therapy Treatment Patient Details Name: Denise Espinoza MRN: [id number] DOB: 04/25/1930 Today's Date: 02/08/2019    History of present illness 85 y/o female s/p ORIF C2 fx, C1 laminectomy for decompression of spinal cord and posterolateral fusion from occiput to C4. PMH including but not limited to HTN and HLD.   OT comments  Pt demonstrates significant weakness bil. UEs which limits her ability to perform ADL activities.  She currently requires mod - max A for self feeding, and total A to make a phone call.   Will plan to see again today to try adaptive equipment with her to increase independence with these basic skills.  She was instructed in scapular strengthening exercises and will issue her an HEP for UE strengthening.  Anticipate she will require max A from family for most ADLs - pt indicates they are able to assist her and she wants to discharge home.  Will continue to follow.  Goals added.   Follow Up Recommendations  Home health OT    Equipment Recommendations  3 in 1 bedside commode    Recommendations for Other Services      Precautions / Restrictions Precautions Precautions: Fall;Cervical Precaution Comments: reviewed ccollar with patient for proper fit Required Braces or Orthoses: Cervical Brace Cervical Brace: Hard collar       Mobility Bed Mobility                  Transfers                      Balance                                           ADL either performed or assessed with clinical judgement   ADL Overall ADL's : Needs assistance/impaired Eating/Feeding: Moderate assistance;Bed level;Maximal assistance Eating/Feeding Details (indicate cue type and reason): Pt requires full set up for meals.   If plate placed in her lap, she is able to self feed items she can stab with a fork with mod A, overall.  She fatigues quickly, and demonstrates difficulty targeting her mouth as well as difficulty manipulating utensil.    Propping Lt elbow on pillow, folded towels, helps with needed shoulder flexion to complete task.  She also demonstrates difficulty with full mouth opening due to C-collar.  She is able to drink from sytrofoam cup if it is placed in her hand  Grooming: Wash/dry hands;Wash/dry face;Oral care;Applying deodorant;Maximal assistance;Bed level                                       Vision       Perception     Praxis      Cognition Arousal/Alertness: Awake/alert Behavior During Therapy: WFL for tasks assessed/performed Overall Cognitive Status: No family/caregiver present to determine baseline cognitive functioning                                 General Comments: Pt is slow to process information         Exercises     Shoulder Instructions       General Comments Pt requires max A to use cell phone as she is unable to manipulate the phone well, and  is unable to apply enough force through fingers to operate the touch screen     Pertinent Vitals/ Pain       Pain Assessment: 0-10 Pain Score: 9  Pain Location: neck Pain Descriptors / Indicators: Aching;Operative site guarding;Grimacing Pain Intervention(s): Monitored during session;Premedicated before session  Home Living                                          Prior Functioning/Environment              Frequency  Min 3X/week        Progress Toward Goals  OT Goals(current goals can now be found in the care plan section)  Progress towards OT goals: Not progressing toward goals - comment(due to pain and weakness )  Acute Rehab OT Goals Patient Stated Goal: get stronger and be able to do things  OT Goal Formulation: With patient Time For Goal Achievement: 02/22/19 Potential to Achieve Goals: Good ADL Goals Pt Will Perform Eating: with min assist;with adaptive utensils;sitting Pt Will Perform Grooming: with mod assist;standing;with adaptive equipment Pt/caregiver will  Perform Home Exercise Program: Increased ROM;Increased strength;Right Upper extremity;Left upper extremity Additional ADL Goal #2: Pt will be able to make a phone call with min A using AE as appropriate  Plan Discharge plan remains appropriate    Co-evaluation                 AM-PAC OT "6 Clicks" Daily Activity     Outcome Measure   Help from another person eating meals?: A Lot Help from another person taking care of personal grooming?: A Lot Help from another person toileting, which includes using toliet, bedpan, or urinal?: A Lot Help from another person bathing (including washing, rinsing, drying)?: A Lot Help from another person to put on and taking off regular upper body clothing?: Total Help from another person to put on and taking off regular lower body clothing?: Total 6 Click Score: 10    End of Session Equipment Utilized During Treatment: Cervical collar  OT Visit Diagnosis: Unsteadiness on feet (R26.81);Muscle weakness (generalized) (M62.81)   Activity Tolerance Patient limited by fatigue   Patient Left in bed;with call bell/phone within reach;with nursing/sitter in room   Nurse Communication Mobility status        Time:  -    02/08/19 1100  OT Time Calculation  OT Start Time (ACUTE ONLY) 1034  OT Stop Time (ACUTE ONLY) 1111  OT Time Calculation (min) 37 min  OT General Charges  $OT Visit 1 Visit  OT Treatments  $Self Care/Home Management  23-37 mins               Charges:    Denise Espinoza, OTR/L Acute Rehabilitation Services Pager [phone] Office [phone]    Denise Espinoza 02/08/2019, 11:49 AM

## 2019-02-09 MED ORDER — METHYLPREDNISOLONE 4 MG PO TBPK
4.0000 mg | ORAL_TABLET | Freq: Three times a day (TID) | ORAL | Status: AC
  Administered 2019-02-10 (×3): 4 mg via ORAL

## 2019-02-09 MED ORDER — METHYLPREDNISOLONE 4 MG PO TBPK
4.0000 mg | ORAL_TABLET | Freq: Four times a day (QID) | ORAL | Status: AC
  Administered 2019-02-11 – 2019-02-14 (×8): 4 mg via ORAL

## 2019-02-09 MED ORDER — METHYLPREDNISOLONE 4 MG PO TBPK
8.0000 mg | ORAL_TABLET | Freq: Every evening | ORAL | Status: AC
  Administered 2019-02-10: 22:00:00 8 mg via ORAL

## 2019-02-09 MED ORDER — METHYLPREDNISOLONE 4 MG PO TBPK
4.0000 mg | ORAL_TABLET | ORAL | Status: AC
  Administered 2019-02-09: 18:00:00 4 mg via ORAL

## 2019-02-09 MED ORDER — METHYLPREDNISOLONE 4 MG PO TBPK
8.0000 mg | ORAL_TABLET | Freq: Every evening | ORAL | Status: AC
  Administered 2019-02-09: 22:00:00 8 mg via ORAL

## 2019-02-09 MED ORDER — METHYLPREDNISOLONE 4 MG PO TBPK
8.0000 mg | ORAL_TABLET | Freq: Every morning | ORAL | Status: AC
  Administered 2019-02-09: 10:00:00 8 mg via ORAL
  Filled 2019-02-09: qty 21

## 2019-02-09 MED ORDER — METHYLPREDNISOLONE 4 MG PO TBPK
4.0000 mg | ORAL_TABLET | ORAL | Status: AC
  Administered 2019-02-09: 13:00:00 4 mg via ORAL

## 2019-02-09 MED ORDER — PANTOPRAZOLE SODIUM 40 MG PO TBEC
40.0000 mg | DELAYED_RELEASE_TABLET | Freq: Two times a day (BID) | ORAL | Status: DC
  Administered 2019-02-09 – 2019-02-15 (×12): 40 mg via ORAL
  Filled 2019-02-09 (×12): qty 1

## 2019-02-09 NOTE — Progress Notes (Signed)
Subjective: Patient reports Patient doing well feels a little bit of numbness in her feet overall unchanged neurologically  Objective: Vital signs in last 24 hours: Temp:  [97.8 F (36.6 C)-98.9 F (37.2 C)] 97.8 F (36.6 C) (05/03 0318) Pulse Rate:  [74-90] 80 (05/03 0318) Resp:  [16-18] 18 (05/03 0318) BP: (129-156)/(68-77) 152/74 (05/03 0318) SpO2:  [92 %-99 %] 97 % (05/03 0318)  Intake/Output from previous day: 05/02 0701 - 05/03 0700 In: 3 [I.V.:3] Out: 1200 [Urine:1200] Intake/Output this shift: Total I/O In: 210 [P.O.:210] Out: -   Still diffusely weak little more difficulty lifting her left arm up overhead this morning but distal arm strength hand strength is stable per her report at 4 out of 5  Lab Results: Recent Labs    02/07/19 0324  WBC 10.9*  HGB 11.6*  HCT 34.8*  PLT 172   BMET Recent Labs    02/07/19 0324  NA 135  K 4.3  CL 101  CO2 24  GLUCOSE 149*  BUN 17  CREATININE 0.68  CALCIUM 8.8*    Studies/Results: No results found.  Assessment/Plan: Quadriparesis status post occipital cervical fusion and decompression.  We will start her on oral steroid and continue with physical occupational therapy work on rehab placement  LOS: 5 days     Toma Arts P 02/09/2019, 9:11 AM

## 2019-02-10 ENCOUNTER — Other Ambulatory Visit: Payer: Self-pay | Admitting: Neurosurgery

## 2019-02-10 ENCOUNTER — Inpatient Hospital Stay (HOSPITAL_COMMUNITY): Payer: Medicare Other

## 2019-02-10 MED ORDER — CHLORHEXIDINE GLUCONATE CLOTH 2 % EX PADS
6.0000 | MEDICATED_PAD | Freq: Once | CUTANEOUS | Status: AC
  Administered 2019-02-10: 22:00:00 6 via TOPICAL

## 2019-02-10 MED ORDER — CHLORHEXIDINE GLUCONATE CLOTH 2 % EX PADS: 6.0000 | MEDICATED_PAD | Freq: Once | CUTANEOUS | Status: DC

## 2019-02-10 NOTE — Progress Notes (Signed)
  NEUROSURGERY PROGRESS NOTE   No issues overnight.  Complains of worsening BUE strength since Saturday, L>R   EXAM:  BP (!) 156/73 (BP Location: Left Arm) Comment: rn notified  Pulse 80   Temp 98.6 F (37 C) (Oral)   Resp 18   Ht 5\' 3"  (1.6 m)   Wt 65.8 kg   SpO2 97%   BMI 25.69 kg/m   Awake, alert, oriented  Speech fluent, appropriate  CN grossly intact  Unable to lift LUE off bed. Distal strength 4/5 RUE: slight worsening. Unable to lift off bed. Wrist extension/finger extension 3/5  IMPRESSION/PLAN 85 y.o. female s/p cervical decompression and fusion. Slight worsening in BUE since immediate post op. - CT C spine to assess for hardware issue/hematoma - Discussed d/c plan with pt who prefers SNF due to inability to care for self.  Cindra Presume, PA-C [facility]

## 2019-02-10 NOTE — Progress Notes (Signed)
Physical Therapy Treatment Patient Details Name: Denise PilarOpal Espinoza MRN: [id number] DOB: 1929/10/26 Today's Date: 02/10/2019    History of Present Illness 85 y/o female s/p ORIF C2 fx, C1 laminectomy for decompression of spinal cord and posterolateral fusion from occiput to C4. PMH including but not limited to HTN and HLD.    PT Comments    Patient seen for mobility progression. Pt is pleasant and agreeable to therapy. Pt presents with significant bilat UE weakness, bilat LE weakness, and impaired proprioception/balance. Pt is able to take side steps at EOB with mod-max A for balance and managing RW. Given pt's current mobility level recommending CIR for further skilled PT services to maximize independence and safety with mobility.    Follow Up Recommendations  CIR;Supervision/Assistance - 24 hour     Equipment Recommendations  None recommended by PT    Recommendations for Other Services Rehab consult     Precautions / Restrictions Precautions Precautions: Fall;Cervical Precaution Comments: cervical collar on at all times  Required Braces or Orthoses: Cervical Brace Cervical Brace: Hard collar    Mobility  Bed Mobility Overal bed mobility: Needs Assistance Bed Mobility: Rolling;Supine to Sit;Sit to Sidelying Rolling: Min assist   Supine to sit: Mod assist   Sit to sidelying: Max assist    Transfers Overall transfer level: Needs assistance Equipment used: Rolling walker (2 wheeled) Transfers: Sit to/from Stand Sit to Stand: Mod assist         General transfer comment: assistance required to place bilat hands on RW, power up into standing, and for balance in standing; multimodal cues for upright posture but this is difficult for pt to achieve  Ambulation/Gait Ambulation/Gait assistance: Mod assist Gait Distance (Feet): 2 Feet Assistive device: Rolling walker (2 wheeled) Gait Pattern/deviations: Step-to pattern(side steps)     General Gait Details: pt able to take  side steps up [facility] with max A at times due to instability and ataxic LE movements; farther gait training deferred for patient/therapist safety   Stairs             Wheelchair Mobility    Modified Rankin (Stroke Patients Only)       Balance                                            Cognition Arousal/Alertness: Awake/alert Behavior During Therapy: WFL for tasks assessed/performed Overall Cognitive Status: No family/caregiver present to determine baseline cognitive functioning                                 General Comments: slow processing       Exercises General Exercises - Upper Extremity Shoulder Flexion: AAROM;Right;Left;10 reps;Supine Shoulder Extension: AAROM;Right;Left;10 reps;Supine Shoulder ABduction: AAROM;Right;Left;10 reps;Supine Shoulder ADduction: AAROM;Right;Left;10 reps Shoulder Horizontal ABduction: AAROM;Right;Left;10 reps;Supine Shoulder Horizontal ADduction: AAROM;Right;Left;10 reps;Supine Elbow Flexion: AAROM;Right;Left;10 reps;Seated Elbow Extension: AAROM;Right;Left;10 reps;Seated General Exercises - Lower Extremity Long Arc Quad: AROM;Both;Seated Other Exercises Other Exercises: bridging X 5    General Comments        Pertinent Vitals/Pain Pain Assessment: Faces Faces Pain Scale: Hurts little more Pain Location: neck Pain Descriptors / Indicators: Aching;Grimacing Pain Intervention(s): Limited activity within patient's tolerance;Monitored during session;Repositioned    Home Living Family/patient expects to be discharged to:: Private residence Living Arrangements: Children;Other relatives  Prior Function            PT Goals (current goals can now be found in the care plan section) Progress towards PT goals: Progressing toward goals    Frequency    Min 5X/week      PT Plan Discharge plan needs to be updated    Co-evaluation              AM-PAC PT "6  Clicks" Mobility   Outcome Measure  Help needed turning from your back to your side while in a flat bed without using bedrails?: A Lot Help needed moving from lying on your back to sitting on the side of a flat bed without using bedrails?: A Lot Help needed moving to and from a bed to a chair (including a wheelchair)?: A Lot Help needed standing up from a chair using your arms (e.g., wheelchair or bedside chair)?: A Lot Help needed to walk in hospital room?: A Lot Help needed climbing 3-5 steps with a railing? : Total 6 Click Score: 11    End of Session Equipment Utilized During Treatment: Gait belt;Cervical collar Activity Tolerance: Patient limited by pain;Patient limited by fatigue Patient left: in bed;with call bell/phone within reach;Other (comment)(pt on bed pan) Nurse Communication: Mobility status;Other (comment)(NT notified pt is on bed pan ) PT Visit Diagnosis: Other abnormalities of gait and mobility (R26.89);Pain Pain - part of body: (neck)     Time: 3545-6256 PT Time Calculation (min) (ACUTE ONLY): 32 min  Charges:  $Gait Training: 23-37 mins                     Erline Levine, PTA Acute Rehabilitation Services Pager: [phone] Office: [phone]     Carolynne Edouard 02/10/2019, 4:23 PM

## 2019-02-10 NOTE — Progress Notes (Signed)
I saw Mrs. Denise Espinoza this afternoon after CT was completed. She has minimal neck pain but does report that her left arm is weak, and her right arm is certainly weaker than a few days ago.  O/E: CN intact Right  3/5 deltoid  3/5 bicep  4-/5 Tricep  3/5 wrist ext  3/5 grip Left:  3/5 deltoid  4/5 bicep  4/5 tricep  3/5 grip 5/5 BLE  CT C-spine was reviewed which demonstrates significant improvement in alignment of the C2 fracture. The upper cervical canal appears well decompressed. The left C2 translaminar screw is malpositioned and is essentially entirely sublaminar within the canal.  Because she did have significant improvement in strength immediately postop before the decline over the weekend, I am not convinced that the malpositioned screw is responsible for her decline. Nonetheless, the screw is within the canal and should be removed. I did review the results of the CT with the patient and my recommendation for removal of the screw. We discussed risks of the procedure. She provided consent to proceed. All her questions were answered.

## 2019-02-10 NOTE — Progress Notes (Signed)
Occupational Therapy Treatment Patient Details Name: Denise Espinoza MRN: [id number] DOB: September 07, 1930 Today's Date: 02/10/2019    History of present illness 85 y/o female s/p ORIF C2 fx, C1 laminectomy for decompression of spinal cord and posterolateral fusion from occiput to C4. PMH including but not limited to HTN and HLD.   OT comments  Attempted to work on self feeding with pt today, however, pt unable to target mouth using Lt UE and curved utensil today - she now requires total A for this task (she required max A on Sat).  Proprioception impaired > absent bil. Hands.   She tolerated bil. UE exercises well. At this point, do not feel she can return home without rehab as she requires increased amount of assist.   Goals downgraded, recommend CIR - pt very motivated to improve   Follow Up Recommendations  CIR;Supervision/Assistance - 24 hour    Equipment Recommendations  3 in 1 bedside commode    Recommendations for Other Services      Precautions / Restrictions Precautions Precautions: Fall;Cervical Precaution Comments: cervical collar on at all times  Required Braces or Orthoses: Cervical Brace Cervical Brace: Hard collar       Mobility Bed Mobility                  Transfers                      Balance                                           ADL either performed or assessed with clinical judgement   ADL   Eating/Feeding: Total assistance;Bed level Eating/Feeding Details (indicate cue type and reason): Pt now unable to bring utensil to her mouth today.  She demonstrates significant difficulty targeting her mouth even using curved utensil                                          Vision       Perception     Praxis      Cognition Arousal/Alertness: Awake/alert Behavior During Therapy: WFL for tasks assessed/performed Overall Cognitive Status: No family/caregiver present to determine baseline cognitive  functioning                                 General Comments: Pt a bit slow to respond, but was able to recall deatails of session from Sat         Exercises Exercises: General Upper Extremity General Exercises - Upper Extremity Shoulder Flexion: AAROM;Right;Left;10 reps;Supine Shoulder Extension: AAROM;Right;Left;10 reps;Supine Shoulder ABduction: AAROM;Right;Left;10 reps;Supine Shoulder ADduction: AAROM;Right;Left;10 reps Shoulder Horizontal ABduction: AAROM;Right;Left;10 reps;Supine Shoulder Horizontal ADduction: AAROM;Right;Left;10 reps;Supine Elbow Flexion: AAROM;Right;Left;10 reps;Seated Elbow Extension: AAROM;Right;Left;10 reps;Seated   Shoulder Instructions       General Comments      Pertinent Vitals/ Pain       Pain Assessment: Faces Faces Pain Scale: Hurts little more Pain Location: neck Pain Descriptors / Indicators: Aching;Operative site guarding;Grimacing Pain Intervention(s): Monitored during session  Home Living Family/patient expects to be discharged to:: Private residence Living Arrangements: Children;Other relatives  Prior Functioning/Environment              Frequency  Min 3X/week        Progress Toward Goals  OT Goals(current goals can now be found in the care plan section)  Progress towards OT goals: Not progressing toward goals - comment(bil. UE weakness and impaired sensation )     Plan Discharge plan needs to be updated    Co-evaluation                 AM-PAC OT "6 Clicks" Daily Activity     Outcome Measure   Help from another person eating meals?: Total Help from another person taking care of personal grooming?: Total Help from another person toileting, which includes using toliet, bedpan, or urinal?: Total Help from another person bathing (including washing, rinsing, drying)?: Total Help from another person to put on and taking off regular upper body  clothing?: Total Help from another person to put on and taking off regular lower body clothing?: Total 6 Click Score: 6    End of Session Equipment Utilized During Treatment: Cervical collar  OT Visit Diagnosis: Unsteadiness on feet (R26.81);Muscle weakness (generalized) (M62.81)   Activity Tolerance Patient tolerated treatment well   Patient Left in bed;with call bell/phone within reach   Nurse Communication Mobility status        Time: 4782-9562 OT Time Calculation (min): 33 min  Charges: OT General Charges $OT Visit: 1 Visit OT Treatments $Self Care/Home Management : 8-22 mins $Therapeutic Exercise: 8-22 mins  Jeani Hawking, OTR/L Acute Rehabilitation Services Pager [phone] Office [phone]    Jeani Hawking M 02/10/2019, 1:15 PM

## 2019-02-10 NOTE — Progress Notes (Signed)
Rehab Admissions Coordinator Note:  Patient was screened by Clois Dupes for appropriateness for an Inpatient Acute Rehab Consult per OT change in recs from [facility] to CIR.  At this time, we are recommending Inpatient Rehab consult. Please place order for consult.  Clois Dupes 02/10/2019, 1:42 PM  I can be reached at [phone].

## 2019-02-11 ENCOUNTER — Inpatient Hospital Stay (HOSPITAL_COMMUNITY): Payer: Medicare Other | Admitting: Certified Registered Nurse Anesthetist

## 2019-02-11 ENCOUNTER — Encounter (HOSPITAL_COMMUNITY): Payer: Self-pay | Admitting: Certified Registered Nurse Anesthetist

## 2019-02-11 ENCOUNTER — Encounter (HOSPITAL_COMMUNITY)
Admission: EM | Disposition: A | Discharge status: Rehabilitation Facility | Payer: Self-pay | Source: Home / Self Care | Attending: Neurosurgery

## 2019-02-11 HISTORY — PX: POSTERIOR CERVICAL FUSION/FORAMINOTOMY: SHX5038

## 2019-02-11 LAB — SURGICAL PCR SCREEN
MRSA, PCR: NEGATIVE
Staphylococcus aureus: NEGATIVE

## 2019-02-11 SURGERY — POSTERIOR CERVICAL FUSION/FORAMINOTOMY LEVEL 1
Anesthesia: General | Site: Spine Cervical

## 2019-02-11 MED ORDER — HYDROMORPHONE HCL 1 MG/ML IJ SOLN
0.2500 mg | INTRAMUSCULAR | Status: DC | PRN
  Administered 2019-02-11 (×4): 0.5 mg via INTRAVENOUS

## 2019-02-11 MED ORDER — FENTANYL CITRATE (PF) 250 MCG/5ML IJ SOLN
INTRAMUSCULAR | Status: DC | PRN
  Administered 2019-02-11: 50 ug via INTRAVENOUS
  Administered 2019-02-11: 25 ug via INTRAVENOUS
  Administered 2019-02-11: 100 ug via INTRAVENOUS
  Administered 2019-02-11: 25 ug via INTRAVENOUS

## 2019-02-11 MED ORDER — THROMBIN 5000 UNITS EX SOLR
CUTANEOUS | Status: AC
  Filled 2019-02-11: qty 5000

## 2019-02-11 MED ORDER — SUCCINYLCHOLINE CHLORIDE 200 MG/10ML IV SOSY
PREFILLED_SYRINGE | INTRAVENOUS | Status: DC | PRN
  Administered 2019-02-11: 100 mg via INTRAVENOUS

## 2019-02-11 MED ORDER — SODIUM CHLORIDE 0.9 % IV SOLN
INTRAVENOUS | Status: DC | PRN
  Administered 2019-02-11: 500 mL

## 2019-02-11 MED ORDER — ESMOLOL HCL 100 MG/10ML IV SOLN
INTRAVENOUS | Status: DC | PRN
  Administered 2019-02-11: 30 mg via INTRAVENOUS

## 2019-02-11 MED ORDER — ROCURONIUM BROMIDE 10 MG/ML (PF) SYRINGE
PREFILLED_SYRINGE | INTRAVENOUS | Status: AC
  Filled 2019-02-11: qty 10

## 2019-02-11 MED ORDER — BACITRACIN ZINC 500 UNIT/GM EX OINT
TOPICAL_OINTMENT | CUTANEOUS | Status: DC | PRN
  Administered 2019-02-11: 1 via TOPICAL

## 2019-02-11 MED ORDER — ONDANSETRON HCL 4 MG/2ML IJ SOLN
INTRAMUSCULAR | Status: DC | PRN
  Administered 2019-02-11: 4 mg via INTRAVENOUS

## 2019-02-11 MED ORDER — BACITRACIN ZINC 500 UNIT/GM EX OINT
TOPICAL_OINTMENT | CUTANEOUS | Status: AC
  Filled 2019-02-11: qty 28.35

## 2019-02-11 MED ORDER — ONDANSETRON HCL 4 MG/2ML IJ SOLN
INTRAMUSCULAR | Status: AC
  Filled 2019-02-11: qty 2

## 2019-02-11 MED ORDER — PROMETHAZINE HCL 25 MG/ML IJ SOLN: 6.2500 mg | INTRAMUSCULAR | Status: DC | PRN

## 2019-02-11 MED ORDER — DEXAMETHASONE SODIUM PHOSPHATE 10 MG/ML IJ SOLN
INTRAMUSCULAR | Status: AC
  Filled 2019-02-11: qty 1

## 2019-02-11 MED ORDER — ESMOLOL HCL 100 MG/10ML IV SOLN
INTRAVENOUS | Status: AC
  Filled 2019-02-11: qty 10

## 2019-02-11 MED ORDER — ACETAMINOPHEN 500 MG PO TABS
1000.0000 mg | ORAL_TABLET | Freq: Four times a day (QID) | ORAL | Status: AC
  Administered 2019-02-11 – 2019-02-12 (×2): 1000 mg via ORAL
  Filled 2019-02-11 (×2): qty 2

## 2019-02-11 MED ORDER — VANCOMYCIN HCL IN DEXTROSE 1-5 GM/200ML-% IV SOLN
1000.0000 mg | INTRAVENOUS | Status: AC
  Administered 2019-02-11: 10:00:00 1000 mg via INTRAVENOUS
  Filled 2019-02-11 (×2): qty 200

## 2019-02-11 MED ORDER — HYDROMORPHONE HCL 1 MG/ML IJ SOLN
INTRAMUSCULAR | Status: AC
  Filled 2019-02-11: qty 2

## 2019-02-11 MED ORDER — SODIUM CHLORIDE 0.9 % IV SOLN: INTRAVENOUS | Status: DC

## 2019-02-11 MED ORDER — FENTANYL CITRATE (PF) 250 MCG/5ML IJ SOLN
INTRAMUSCULAR | Status: AC
  Filled 2019-02-11: qty 5

## 2019-02-11 MED ORDER — LIDOCAINE 2% (20 MG/ML) 5 ML SYRINGE
INTRAMUSCULAR | Status: DC | PRN
  Administered 2019-02-11: 100 mg via INTRAVENOUS

## 2019-02-11 MED ORDER — LACTATED RINGERS IV SOLN
INTRAVENOUS | Status: DC | PRN
  Administered 2019-02-11: 10:00:00 via INTRAVENOUS

## 2019-02-11 MED ORDER — ROCURONIUM BROMIDE 10 MG/ML (PF) SYRINGE
PREFILLED_SYRINGE | INTRAVENOUS | Status: DC | PRN
  Administered 2019-02-11: 40 mg via INTRAVENOUS

## 2019-02-11 MED ORDER — SUCCINYLCHOLINE CHLORIDE 200 MG/10ML IV SOSY
PREFILLED_SYRINGE | INTRAVENOUS | Status: AC
  Filled 2019-02-11: qty 10

## 2019-02-11 MED ORDER — LIDOCAINE-EPINEPHRINE 1 %-1:100000 IJ SOLN
INTRAMUSCULAR | Status: AC
  Filled 2019-02-11: qty 1

## 2019-02-11 MED ORDER — SUGAMMADEX SODIUM 200 MG/2ML IV SOLN
INTRAVENOUS | Status: DC | PRN
  Administered 2019-02-11: 150 mg via INTRAVENOUS

## 2019-02-11 MED ORDER — THROMBIN 5000 UNITS EX SOLR
OROMUCOSAL | Status: DC | PRN
  Administered 2019-02-11: 5 mL via TOPICAL

## 2019-02-11 MED ORDER — LIDOCAINE 2% (20 MG/ML) 5 ML SYRINGE
INTRAMUSCULAR | Status: AC
  Filled 2019-02-11: qty 10

## 2019-02-11 MED ORDER — MEPERIDINE HCL 25 MG/ML IJ SOLN: 6.2500 mg | INTRAMUSCULAR | Status: DC | PRN

## 2019-02-11 MED ORDER — 0.9 % SODIUM CHLORIDE (POUR BTL) OPTIME
TOPICAL | Status: DC | PRN
  Administered 2019-02-11: 1000 mL

## 2019-02-11 MED ORDER — PHENYLEPHRINE 40 MCG/ML (10ML) SYRINGE FOR IV PUSH (FOR BLOOD PRESSURE SUPPORT)
PREFILLED_SYRINGE | INTRAVENOUS | Status: DC | PRN
  Administered 2019-02-11: 80 ug via INTRAVENOUS

## 2019-02-11 MED ORDER — BUPIVACAINE HCL (PF) 0.5 % IJ SOLN
INTRAMUSCULAR | Status: AC
  Filled 2019-02-11: qty 30

## 2019-02-11 MED ORDER — ACETAMINOPHEN 500 MG PO TABS
1000.0000 mg | ORAL_TABLET | Freq: Four times a day (QID) | ORAL | Status: DC
  Administered 2019-02-11: 15:00:00 1000 mg via ORAL
  Filled 2019-02-11 (×2): qty 2

## 2019-02-11 MED ORDER — PROPOFOL 10 MG/ML IV BOLUS
INTRAVENOUS | Status: DC | PRN
  Administered 2019-02-11: 30 mg via INTRAVENOUS
  Administered 2019-02-11: 80 mg via INTRAVENOUS

## 2019-02-11 MED ORDER — METHYLPREDNISOLONE SODIUM SUCC 125 MG IJ SOLR
INTRAMUSCULAR | Status: DC | PRN
  Administered 2019-02-11: 40 mg via INTRAVENOUS

## 2019-02-11 SURGICAL SUPPLY — 56 items
BAG DECANTER FOR FLEXI CONT (MISCELLANEOUS) ×3 IMPLANT
BENZOIN TINCTURE PRP APPL 2/3 (GAUZE/BANDAGES/DRESSINGS) IMPLANT
BLADE CLIPPER SURG (BLADE) ×3 IMPLANT
BLADE ULTRA TIP 2M (BLADE) IMPLANT
BUR MATCHSTICK NEURO 3.0 LAGG (BURR) IMPLANT
BUR PRECISION FLUTE 5.0 (BURR) IMPLANT
CANISTER SUCT 3000ML PPV (MISCELLANEOUS) ×3 IMPLANT
CARTRIDGE OIL MAESTRO DRILL (MISCELLANEOUS) IMPLANT
CLOSURE WOUND 1/2 X4 (GAUZE/BANDAGES/DRESSINGS)
COVER WAND RF STERILE (DRAPES) ×3 IMPLANT
DECANTER SPIKE VIAL GLASS SM (MISCELLANEOUS) ×3 IMPLANT
DERMABOND ADVANCED (GAUZE/BANDAGES/DRESSINGS) ×2
DERMABOND ADVANCED .7 DNX12 (GAUZE/BANDAGES/DRESSINGS) ×1 IMPLANT
DIFFUSER DRILL AIR PNEUMATIC (MISCELLANEOUS) IMPLANT
DRAPE C-ARM 42X72 X-RAY (DRAPES) IMPLANT
DRAPE LAPAROTOMY 100X72 PEDS (DRAPES) ×3 IMPLANT
DRSG OPSITE POSTOP 4X6 (GAUZE/BANDAGES/DRESSINGS) ×3 IMPLANT
DURAPREP 6ML APPLICATOR 50/CS (WOUND CARE) ×3 IMPLANT
ELECT REM PT RETURN 9FT ADLT (ELECTROSURGICAL) ×3
ELECTRODE REM PT RTRN 9FT ADLT (ELECTROSURGICAL) ×1 IMPLANT
GAUZE 4X4 16PLY RFD (DISPOSABLE) IMPLANT
GAUZE SPONGE 4X4 12PLY STRL (GAUZE/BANDAGES/DRESSINGS) IMPLANT
GLOVE BIO SURGEON STRL SZ7 (GLOVE) IMPLANT
GLOVE BIOGEL PI IND STRL 7.0 (GLOVE) IMPLANT
GLOVE BIOGEL PI IND STRL 7.5 (GLOVE) ×1 IMPLANT
GLOVE BIOGEL PI INDICATOR 7.0 (GLOVE)
GLOVE BIOGEL PI INDICATOR 7.5 (GLOVE) ×2
GLOVE ECLIPSE 7.0 STRL STRAW (GLOVE) ×3 IMPLANT
GLOVE EXAM NITRILE XL STR (GLOVE) IMPLANT
GOWN STRL REUS W/ TWL LRG LVL3 (GOWN DISPOSABLE) ×4 IMPLANT
GOWN STRL REUS W/ TWL XL LVL3 (GOWN DISPOSABLE) IMPLANT
GOWN STRL REUS W/TWL 2XL LVL3 (GOWN DISPOSABLE) IMPLANT
GOWN STRL REUS W/TWL LRG LVL3 (GOWN DISPOSABLE) ×8
GOWN STRL REUS W/TWL XL LVL3 (GOWN DISPOSABLE)
HEMOSTAT POWDER KIT SURGIFOAM (HEMOSTASIS) ×3 IMPLANT
KIT BASIN OR (CUSTOM PROCEDURE TRAY) ×3 IMPLANT
KIT TURNOVER KIT B (KITS) ×3 IMPLANT
NEEDLE HYPO 22GX1.5 SAFETY (NEEDLE) ×3 IMPLANT
NS IRRIG 1000ML POUR BTL (IV SOLUTION) ×3 IMPLANT
OIL CARTRIDGE MAESTRO DRILL (MISCELLANEOUS)
PACK LAMINECTOMY NEURO (CUSTOM PROCEDURE TRAY) ×3 IMPLANT
PAD ARMBOARD 7.5X6 YLW CONV (MISCELLANEOUS) ×9 IMPLANT
PIN MAYFIELD SKULL DISP (PIN) ×3 IMPLANT
SPONGE LAP 4X18 RFD (DISPOSABLE) IMPLANT
SPONGE SURGIFOAM ABS GEL SZ50 (HEMOSTASIS) IMPLANT
STAPLER VISISTAT 35W (STAPLE) ×3 IMPLANT
STRIP CLOSURE SKIN 1/2X4 (GAUZE/BANDAGES/DRESSINGS) IMPLANT
SUT ETHILON 3 0 FSL (SUTURE) IMPLANT
SUT VIC AB 0 CT1 18XCR BRD8 (SUTURE) ×1 IMPLANT
SUT VIC AB 0 CT1 8-18 (SUTURE) ×2
SUT VIC AB 2-0 CT1 18 (SUTURE) IMPLANT
SUT VICRYL 3-0 RB1 18 ABS (SUTURE) ×3 IMPLANT
TOWEL GREEN STERILE (TOWEL DISPOSABLE) ×3 IMPLANT
TOWEL GREEN STERILE FF (TOWEL DISPOSABLE) ×3 IMPLANT
UNDERPAD 30X30 (UNDERPADS AND DIAPERS) ×3 IMPLANT
WATER STERILE IRR 1000ML POUR (IV SOLUTION) ×3 IMPLANT

## 2019-02-11 NOTE — Anesthesia Postprocedure Evaluation (Signed)
Anesthesia Post Note  Patient: Denise Espinoza  Procedure(s) Performed: POSTERIOR CERVICAL INSTRUMENTATION REMOVAL CERVICAL TWO (N/A Spine Cervical)     Patient location during evaluation: PACU Anesthesia Type: General Level of consciousness: sedated and patient cooperative Pain management: pain level controlled Vital Signs Assessment: post-procedure vital signs reviewed and stable Respiratory status: spontaneous breathing Cardiovascular status: stable Anesthetic complications: no    Last Vitals:  Vitals:   02/11/19 1330 02/11/19 1700  BP: (!) 165/79 126/61  Pulse: 99 92  Resp: 15 16  Temp: 37.1 C 37.1 C  SpO2: 93% 95%    Last Pain:  Vitals:   02/11/19 1700  TempSrc: Axillary  PainSc:                  [name]

## 2019-02-11 NOTE — Progress Notes (Signed)
Orthopedic Tech Progress Note Patient Details:  Denise Espinoza 1930/03/07 [id number] PACU RN said patient did have on brace when she left from out of PACU Patient ID: Saryiah Bartholf, female   DOB: Nov 23, 1929, 85 y.o.   MRN: [id number]   [name] 02/11/2019, 2:12 PM

## 2019-02-11 NOTE — Anesthesia Procedure Notes (Signed)
Arterial Line Insertion Start/End5/02/2019 10:22 AM Performed by: Carmela Rima, CRNA, CRNA  Patient location: Pre-op. Preanesthetic checklist: patient identified, IV checked, site marked, risks and benefits discussed, surgical consent, monitors and equipment checked, pre-op evaluation, timeout performed and anesthesia consent Lidocaine 1% used for infiltration Left, radial was placed Catheter size: 20 G Hand hygiene performed  and maximum sterile barriers used   Attempts: 1 Procedure performed without using ultrasound guided technique. Following insertion, dressing applied and Biopatch. Post procedure assessment: normal and unchanged  Patient tolerated the procedure well with no immediate complications.

## 2019-02-11 NOTE — Anesthesia Procedure Notes (Signed)
Procedure Name: Intubation Date/Time: 02/11/2019 11:00 AM Performed by: Nils Pyle, CRNA Pre-anesthesia Checklist: Patient identified, Emergency Drugs available, Suction available and Patient being monitored Patient Re-evaluated:Patient Re-evaluated prior to induction Oxygen Delivery Method: Circle System Utilized Preoxygenation: Pre-oxygenation with 100% oxygen Induction Type: IV induction and Rapid sequence Laryngoscope Size: Glidescope and 3 Grade View: Grade I Tube type: Oral Tube size: 7.0 mm Number of attempts: 1 Airway Equipment and Method: Stylet and Oral airway Placement Confirmation: ETT inserted through vocal cords under direct vision,  positive ETCO2 and breath sounds checked- equal and bilateral Secured at: 21 cm Tube secured with: Tape Dental Injury: Teeth and Oropharynx as per pre-operative assessment

## 2019-02-11 NOTE — Transfer of Care (Signed)
Immediate Anesthesia Transfer of Care Note  Patient: Denise Espinoza  Procedure(s) Performed: POSTERIOR CERVICAL INSTRUMENTATION REMOVAL CERVICAL TWO (N/A Spine Cervical)  Patient Location: PACU  Anesthesia Type:General  Level of Consciousness: awake, alert  and oriented  Airway & Oxygen Therapy: Patient Spontanous Breathing  Post-op Assessment: Report given to RN and Post -op Vital signs reviewed and stable  Post vital signs: Reviewed and stable  Last Vitals:  Vitals Value Taken Time  BP 182/74 02/11/2019 12:06 PM  Temp    Pulse 69 02/11/2019 12:07 PM  Resp 13 02/11/2019 12:08 PM  SpO2 97 % 02/11/2019 12:08 PM  Vitals shown include unvalidated device data.  Last Pain:  Vitals:   02/11/19 0825  TempSrc:   PainSc: 3       Patients Stated Pain Goal: 4 (02/11/19 0949)  Complications: No apparent anesthesia complications

## 2019-02-11 NOTE — Progress Notes (Signed)
Physical Therapy Cancellation Note   02/11/19 1046  PT Visit Information  Last PT Received On 02/11/19  Reason Eval/Treat Not Completed Patient at procedure or test/unavailable. Pt off unit for procedure. PT will continue to follow acutely.    Erline Levine, PTA Acute Rehabilitation Services Pager: [phone] Office: [phone]

## 2019-02-11 NOTE — Anesthesia Preprocedure Evaluation (Signed)
Anesthesia Evaluation  Patient identified by MRN, date of birth, ID band Patient awake    Reviewed: Allergy & Precautions, NPO status , Patient's Chart, lab work & pertinent test results, reviewed documented beta blocker date and time   Airway Mallampati: II  TM Distance: >3 FB Neck ROM: Full    Dental  (+) Edentulous Upper, Edentulous Lower   Pulmonary neg pulmonary ROS,    Pulmonary exam normal breath sounds clear to auscultation       Cardiovascular Pt. 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  Reproductive/Obstetrics                             Anesthesia Physical  Anesthesia Plan  ASA: III  Anesthesia Plan: General   Post-op Pain Management:    Induction: Intravenous  PONV Risk Score and Plan: 3 and Ondansetron, Dexamethasone and Treatment may vary due to age or medical condition  Airway Management Planned: Oral ETT and Video Laryngoscope Planned  Additional Equipment: Arterial line  Intra-op Plan:   Post-operative Plan: Extubation in OR and Possible Post-op intubation/ventilation  Informed Consent: I have reviewed the patients History and Physical, chart, labs and discussed the procedure including the risks, benefits and alternatives for the proposed anesthesia with the patient or authorized representative who has indicated his/her understanding and acceptance.     Dental advisory given  Plan Discussed with:  CRNA  Anesthesia Plan Comments:         Anesthesia Quick Evaluation

## 2019-02-11 NOTE — Progress Notes (Signed)
Pharmacy Antibiotic Note  Denise Espinoza is a 85 y.o. female admitted on 02/04/2019 s/p Removal of left C2 translaminar screw.  Pharmacy has been consulted for vancomycin dosing. As no drain is documented, will enter vancomycin dose 12 hrs after pre-op dose and d/c consult.  Pre-op vancomycin 1g IV x 1 given at 0958 this AM. Scr WNL pre-op.  Plan: Vancomycin 500mg  IV x 1 at 2200 Pharmacy will s/o consult  Height: 5\' 3"  (160 cm) Weight: 145 lb (65.8 kg) IBW/kg (Calculated) : 52.4  Temp (24hrs), Avg:98.3 F (36.8 C), Min:97.2 F (36.2 C), Max:99.3 F (37.4 C)  Recent Labs  Lab 02/04/19 1500 02/04/19 1554 02/07/19 0324  WBC  --  5.9 10.9*  CREATININE 0.70 0.61 0.68    Estimated Creatinine Clearance: 45.2 mL/min (by C-G formula based on SCr of 0.68 mg/dL).    Allergies  Allergen Reactions  . Penicillins Hives, Swelling, Rash and Other (See Comments)    Did it involve swelling of the face/tongue/throat, SOB, or low BP? #  #  #  YES  #  #  #  Did it involve sudden or severe rash/hives, skin peeling, or any reaction on the inside of your mouth or nose? No Did you need to seek medical attention at a hospital or doctor's office? #  #  #  YES  #  #  #  When did it last happen?85 years old.   . Codeine Phosphate [Codeine] Other (See Comments)    headache   Babs Bertin, PharmD, BCPS Please check AMION for all [facility] contact numbers Clinical Pharmacist 02/11/2019 2:23 PM

## 2019-02-11 NOTE — Op Note (Signed)
  NEUROSURGERY OPERATIVE NOTE   PREOP DIAGNOSIS:  1. Screw Malposition   POSTOP DIAGNOSIS: Same  PROCEDURE: 1. Removal of left C2 translaminar screw  SURGEON: Dr. Lisbeth Renshaw, MD  ASSISTANT: Cindra Presume, PA-C  ANESTHESIA: General Endotracheal  EBL: Minimal  SPECIMENS: None  DRAINS: None  COMPLICATIONS: None immediate  CONDITION: Hemodynamically stable to PACU  HISTORY: Denise Espinoza is a 85 y.o. female who initially presented with a type II odontoid fracture with significant displacement and underwent occipitocervical fusion.  Although she was significantly improved on the first postoperative day, she was found to have progressive worsening in upper extremity strength after that.  Follow-up imaging of the cervical spine did demonstrate malposition of the left C2 translaminar screw necessitating removal.  Risks and benefits of the procedure were reviewed in detail with the patient.  After all her questions were answered informed consent was obtained and witnessed.  PROCEDURE IN DETAIL: The patient was brought to the operating room. After induction of general anesthesia, the patient was positioned on the operative table in the [place] head holder in the prone position. All pressure points were meticulously padded.  Care was taken to maintain neutral alignment.  Previous skin incision was then prepped and draped in the usual sterile fashion.  After timeout was conducted, the previous incision was opened with scissors.  Stitches in the subcutaneous, fascial, and muscular layer were cut out.  I did note some serosanguineous fluid in the subfascial space which appeared to be under some tension and this was evacuated.  The left C2 translaminar screw was identified.  The setscrew was removed.  The C2 laminar screw was then removed.  The offset connector was then retightened over the C2 lamina in order to prevent movement.  The wound was then irrigated with a copious amount of  normal saline irrigation.  The wound was then closed in multiple layers again using interrupted 0 Vicryl stitches in the muscular, fascial, and deep subcutaneous layer.  Skin was closed with standard staples.  Bacitracin ointment and sterile dressing was applied.  The patient was then transferred to the bed and the [place] head holder was removed.  She was then extubated and taken to the postanesthesia care unit in stable hemodynamic condition.  At the end of the case all sponge needle and instrument counts were correct.

## 2019-02-12 ENCOUNTER — Inpatient Hospital Stay (HOSPITAL_COMMUNITY): Payer: Medicare Other

## 2019-02-12 ENCOUNTER — Encounter (HOSPITAL_COMMUNITY): Payer: Self-pay | Admitting: Neurosurgery

## 2019-02-12 LAB — PROTIME-INR
INR: 1.1 (ref 0.8–1.2)
Prothrombin Time: 14 seconds (ref 11.4–15.2)

## 2019-02-12 LAB — BASIC METABOLIC PANEL
Anion gap: 13 (ref 5–15)
BUN: 12 mg/dL (ref 8–23)
CO2: 27 mmol/L (ref 22–32)
Calcium: 8.9 mg/dL (ref 8.9–10.3)
Chloride: 98 mmol/L (ref 98–111)
Creatinine, Ser: 0.48 mg/dL (ref 0.44–1.00)
GFR calc Af Amer: >60 mL/min (ref >60)
GFR calc non Af Amer: >60 mL/min (ref >60)
Glucose, Bld: 103 mg/dL — ABNORMAL HIGH (ref 70–99)
Potassium: 3.9 mmol/L (ref 3.5–5.1)
Sodium: 138 mmol/L (ref 135–145)

## 2019-02-12 LAB — CBC
HCT: 33.6 % — ABNORMAL LOW (ref 36.0–46.0)
Hemoglobin: 11.1 g/dL — ABNORMAL LOW (ref 12.0–15.0)
MCH: 30.2 pg (ref 26.0–34.0)
MCHC: 33 g/dL (ref 30.0–36.0)
MCV: 91.6 fL (ref 80.0–100.0)
Platelets: 200 10*3/uL (ref 150–400)
RBC: 3.67 MIL/uL — ABNORMAL LOW (ref 3.87–5.11)
RDW: 12.7 % (ref 11.5–15.5)
WBC: 7.4 10*3/uL (ref 4.0–10.5)
nRBC: 0 % (ref 0.0–0.2)

## 2019-02-12 LAB — APTT: aPTT: 28 seconds (ref 24–36)

## 2019-02-12 NOTE — Progress Notes (Signed)
Physical Therapy Treatment Patient Details Name: Denise Espinoza MRN: [id number] DOB: 10-28-1929 Today's Date: 02/12/2019    History of Present Illness 85 y/o female s/p ORIF C2 fx, C1 laminectomy for decompression of spinal cord and posterolateral fusion from occiput to C4. S/P removal of L  C2 translaminar screw 5/5. PMH including but not limited to HTN and HLD.    PT Comments    Patient seen for mobility progression. Pt agreeable to participate in therapy. Pt presents with generalized weakness, especially bilat UE, and impaired balance. Pt requires +2 assist for OOB mobility. Continue to recommend CIR for further skilled therapies to maximize independence and safety with mobility.      Follow Up Recommendations  CIR;Supervision/Assistance - 24 hour     Equipment Recommendations  None recommended by PT    Recommendations for Other Services Rehab consult     Precautions / Restrictions Precautions Precautions: Fall;Cervical Precaution Comments: cervical collar on at all times  Required Braces or Orthoses: Cervical Brace Cervical Brace: Hard collar;At all times Restrictions Weight Bearing Restrictions: No    Mobility  Bed Mobility Overal bed mobility: Needs Assistance Bed Mobility: Rolling;Sidelying to Sit;Sit to Sidelying Rolling: Min assist Sidelying to sit: Mod assist;+2 for safety/equipment     Sit to sidelying: Max assist;+2 for physical assistance General bed mobility comments: pt able to roll towards L side with min assist given increased time; assist for LBs and trunk support to EOB and from EOB to supine; cueing for log roll technique and safety   Transfers Overall transfer level: Needs assistance Equipment used: 2 person hand held assist Transfers: Sit to/from Stand Sit to Stand: Mod assist;+2 physical assistance;+2 safety/equipment         General transfer comment: assist to power up into standing, balance and coordination; limited advancement of BLEs to  pivot to [facility] toward R side, but able to initate pivotal steps to L side   Ambulation/Gait             General Gait Details: unable to progress to gait training this session   Stairs             Wheelchair Mobility    Modified Rankin (Stroke Patients Only)       Balance Overall balance assessment: Needs assistance Sitting-balance support: Feet supported Sitting balance-Leahy Scale: Fair Sitting balance - Comments: min assist to min guard    Standing balance support: During functional activity;Bilateral upper extremity supported Standing balance-Leahy Scale: Poor Standing balance comment: relaint on BUE and external support                            Cognition Arousal/Alertness: Awake/alert Behavior During Therapy: WFL for tasks assessed/performed Overall Cognitive Status: No family/caregiver present to determine baseline cognitive functioning Area of Impairment: Safety/judgement;Problem solving                         Safety/Judgement: Decreased awareness of deficits   Problem Solving: Difficulty sequencing;Requires tactile cues;Requires verbal cues;Slow processing;Decreased initiation        Exercises      General Comments General comments (skin integrity, edema, etc.): pt soiled in bed upon entry with purewick not hooked up to suction; practiced pt with using soft call bell under L hand 4/4 times before leaving her to ensure she can call out for assistance      Pertinent Vitals/Pain Pain Assessment: Faces Faces Pain Scale: Hurts a little  bit Pain Location: neck Pain Descriptors / Indicators: Aching;Grimacing Pain Intervention(s): Limited activity within patient's tolerance;Monitored during session;Repositioned    Home Living                      Prior Function            PT Goals (current goals can now be found in the care plan section) Acute Rehab PT Goals Patient Stated Goal: decrease pain, return to  independence Progress towards PT goals: Progressing toward goals    Frequency    Min 5X/week      PT Plan Current plan remains appropriate    Co-evaluation PT/OT/SLP Co-Evaluation/Treatment: Yes Reason for Co-Treatment: Complexity of the patient's impairments (multi-system involvement);For patient/therapist safety;To address functional/ADL transfers PT goals addressed during session: Mobility/safety with mobility;Balance;Strengthening/ROM OT goals addressed during session: ADL's and self-care      AM-PAC PT "6 Clicks" Mobility   Outcome Measure  Help needed turning from your back to your side while in a flat bed without using bedrails?: A Lot Help needed moving from lying on your back to sitting on the side of a flat bed without using bedrails?: A Lot Help needed moving to and from a bed to a chair (including a wheelchair)?: A Lot Help needed standing up from a chair using your arms (e.g., wheelchair or bedside chair)?: A Lot Help needed to walk in hospital room?: A Lot Help needed climbing 3-5 steps with a railing? : Total 6 Click Score: 11    End of Session Equipment Utilized During Treatment: Gait belt;Cervical collar Activity Tolerance: Patient tolerated treatment well Patient left: in bed;with call bell/phone within reach;with bed alarm set;Other (comment)(pt has soft touch call bell) Nurse Communication: Mobility status PT Visit Diagnosis: Other abnormalities of gait and mobility (R26.89);Pain     Time: [id number] PT Time Calculation (min) (ACUTE ONLY): 45 min  Charges:  $Gait Training: 8-22 mins                     Erline LevineKellyn Jean Skow, PTA Acute Rehabilitation Services Pager: [phone](336) [phone] Office: [phone]     Denise EdouardKellyn R Breelyn Espinoza 02/12/2019, 4:42 PM

## 2019-02-12 NOTE — Progress Notes (Signed)
Occupational Therapy Treatment Patient Details Name: Denise Espinoza MRN: [id number] DOB: July 07, 1930 Today's Date: 02/12/2019    History of present illness 85 y/o female s/p ORIF C2 fx, C1 laminectomy for decompression of spinal cord and posterolateral fusion from occiput to C4. S/P removal of L  C2 translaminar screw 5/5. PMH including but not limited to HTN and HLD.   OT comments  Patient supine in bed and agreeable to PT/OT session.  Patient continues to be limited by impaired balance, decreased strength and coordination of B UEs and overall generalized weakness/decreased activity tolerance. Pt found soiled in urine, with purewick not attached to suction, requires max assist to change gown, mod assist +2 for transfers to 3:1 commode, and total assist for toileting/bathing care of LB.  Patient demonstrated ability to push soft call bell using L hand 4/4 times, when setup supported on abdomen with pillow under arm.  Highly motivated, dc plan remains appropriate.  Will follow.    Follow Up Recommendations  CIR;Supervision/Assistance - 24 hour    Equipment Recommendations  3 in 1 bedside commode    Recommendations for Other Services      Precautions / Restrictions Precautions Precautions: Fall;Cervical Precaution Comments: cervical collar on at all times  Required Braces or Orthoses: Cervical Brace Cervical Brace: Hard collar;At all times Restrictions Weight Bearing Restrictions: No       Mobility Bed Mobility Overal bed mobility: Needs Assistance Bed Mobility: Rolling;Sidelying to Sit;Sit to Sidelying Rolling: Min assist Sidelying to sit: Mod assist;+2 for safety/equipment     Sit to sidelying: Max assist;+2 for physical assistance General bed mobility comments: pt able to roll towards L side with min assist given increased time; assist for LBs and trunk support to EOB and from EOB to supine; cueing for log roll technique and safety   Transfers Overall transfer level: Needs  assistance Equipment used: 2 person hand held assist Transfers: Sit to/from Stand Sit to Stand: Mod assist;+2 physical assistance;+2 safety/equipment         General transfer comment: assist to power up into standing, balance and coordination; limited advancement of BLEs to [facility] side, but able to initate pivotal steps to L side     Balance Overall balance assessment: Needs assistance Sitting-balance support: Feet supported Sitting balance-Leahy Scale: Fair Sitting balance - Comments: min assist to min guard    Standing balance support: During functional activity;Bilateral upper extremity supported Standing balance-Leahy Scale: Poor Standing balance comment: relaint on BUE and external support                           ADL either performed or assessed with clinical judgement   ADL Overall ADL's : Needs assistance/impaired             Lower Body Bathing: Total assistance;+2 for physical assistance;Sit to/from stand   Upper Body Dressing : Maximal assistance;Bed level   Lower Body Dressing: Total assistance;Bed level   Toilet Transfer: Moderate assistance;+2 for physical assistance;+2 for safety/equipment;Stand-pivot;BSC   Toileting- Clothing Manipulation and Hygiene: Total assistance;+2 for physical assistance;Sit to/from stand         General ADL Comments: pt limited by decreased functional use of B UES, impaired balance and decreased activity tolerance     Vision       Perception     Praxis      Cognition Arousal/Alertness: Awake/alert Behavior During Therapy: WFL for tasks assessed/performed Overall Cognitive Status: No family/caregiver present to  determine baseline cognitive functioning Area of Impairment: Safety/judgement;Problem solving                         Safety/Judgement: Decreased awareness of deficits   Problem Solving: Difficulty sequencing;Requires tactile cues;Requires verbal cues;Slow  processing;Decreased initiation          Exercises     Shoulder Instructions       General Comments pt soiled in bed upon entry with purewick not hooked up to suction; practiced pt with using soft call bell under L hand 4/4 times before leaving her to ensure she can call out for assistance    Pertinent Vitals/ Pain       Pain Assessment: Faces Faces Pain Scale: Hurts a little bit Pain Location: neck Pain Descriptors / Indicators: Aching;Grimacing Pain Intervention(s): Limited activity within patient's tolerance;Repositioned;Monitored during session  Home Living                                          Prior Functioning/Environment              Frequency  Min 3X/week        Progress Toward Goals  OT Goals(current goals can now be found in the care plan section)  Progress towards OT goals: Progressing toward goals  Acute Rehab OT Goals Patient Stated Goal: decrease pain, return to independence OT Goal Formulation: With patient  Plan Discharge plan remains appropriate;Frequency remains appropriate    Co-evaluation    PT/OT/SLP Co-Evaluation/Treatment: Yes Reason for Co-Treatment: For patient/therapist safety;To address functional/ADL transfers;Complexity of the patient's impairments (multi-system involvement)   OT goals addressed during session: ADL's and self-care      AM-PAC OT "6 Clicks" Daily Activity     Outcome Measure   Help from another person eating meals?: Total Help from another person taking care of personal grooming?: Total Help from another person toileting, which includes using toliet, bedpan, or urinal?: Total Help from another person bathing (including washing, rinsing, drying)?: A Lot Help from another person to put on and taking off regular upper body clothing?: A Lot Help from another person to put on and taking off regular lower body clothing?: Total 6 Click Score: 8    End of Session Equipment Utilized During  Treatment: Cervical collar;Gait belt  OT Visit Diagnosis: Unsteadiness on feet (R26.81);Muscle weakness (generalized) (M62.81)   Activity Tolerance Patient tolerated treatment well   Patient Left in bed;with call bell/phone within reach;with bed alarm set   Nurse Communication Mobility status        Time: 9458-5929 OT Time Calculation (min): 45 min  Charges: OT General Charges $OT Visit: 1 Visit OT Treatments $Self Care/Home Management : 23-37 mins  Chancy Milroy, OT Acute Rehabilitation Services Pager [phone] Office [phone]    Chancy Milroy 02/12/2019, 2:01 PM

## 2019-02-12 NOTE — Progress Notes (Signed)
Inpatient Rehab Admissions:  Inpatient Rehab Consult received.  I met with pt at the bedside for rehabilitation assessment and to discuss goals and expectations of an inpatient rehab admission.  Pt is interested in CIR program. With permission, [facility] will contact her family to determine if caregiver assist is available to support a CIR stay. AC to follow up tomorrow.   Please call if questions.   Jhonnie Garner, OTR/L  Rehab Admissions Coordinator  [phone] 02/12/2019 4:11 PM

## 2019-02-12 NOTE — Progress Notes (Signed)
  NEUROSURGERY PROGRESS NOTE   No issues overnight.  Patient reports worsening numbness in extremities and decreased motor function compared to pre op yesterday.  EXAM:  BP (!) 162/82 (BP Location: Right Arm)   Pulse 75   Temp 98.1 F (36.7 C) (Oral)   Resp 16   Ht 5\' 3"  (1.6 m)   Wt 65.8 kg   SpO2 94%   BMI 25.69 kg/m   Awake, alert, oriented  Speech fluent, appropriate  CN grossly intact  LUE: 4-/5 grip, 3/5 bicep/tricep, 2/5 deltoid RUE: 2/5 grip, unable to activate bicep/tricep/deltoid 5/5 BLE  IMPRESSION/PLAN 85 y.o. female s/p posterior cervical decompression and fusion with subsequent repeat surgery for removal of C2 screw. BUE strength worse compared to pre op. MRI C spine to assess for hematoma.

## 2019-02-12 NOTE — Progress Notes (Signed)
I have reviewed the MRI personally with neuroradiology. Strangely, there has been reduction in the C2 displacement and removal of the C1 arch. There is no significant postoperative fluid collection, pseudomeningocele, or hematoma. Nonetheless, canal diameter remains reduced. Unfortunately, this is possible related to surrounding soft tissue edema. I do not see any surgically correctable pathology. We will therefore cont steroids and expectant observation.

## 2019-02-13 NOTE — Care Management Important Message (Signed)
Important Message  Patient Details  Name: Denise Espinoza MRN: [id number] Date of Birth: 11/09/1929   Medicare Important Message Given:  Yes    Lakishia Bourassa 02/13/2019, 2:16 PM

## 2019-02-13 NOTE — Progress Notes (Signed)
  NEUROSURGERY PROGRESS NOTE   No issues overnight. Complains of neck soreness CIR PA at bedside   EXAM:  BP (!) 130/56 (BP Location: Right Arm) Comment: rn notified  Pulse 82   Temp 98.1 F (36.7 C) (Oral)   Resp 17   Ht 5\' 3"  (1.6 m)   Wt 65.8 kg   SpO2 100%   BMI 25.69 kg/m   Awake, alert, oriented  Speech fluent, appropriate  CN grossly intact  LUE: 4-/5 grip, 3/5 bicep/tricep, 2/5 deltoid RUE: 2/5 grip, unable to activate bicep/tricep/deltoid 5/5 BLE  IMPRESSION/PLAN 85 y.o. female s/p posterior cervical decompression and fusion with subsequent repeat surgery for removal of C2 screw. Stable neurologically compared to yesterday - Continue supportive care - Ready for d/c to CIR if approved

## 2019-02-13 NOTE — H&P (Signed)
Physical Medicine and Rehabilitation Admission H&P    Chief Complaint  Patient presents with  . Functional deficits due to cervical myelopathy post fall.     HPI: Denise Espinoza is an 85 year old female with history of HTN, anosmia, SVT, few months history of BLE instability  who was admitted via [facility] on 02/04/19 with reports of fall 3 weeks PTA with onset of RUE>LUE numbness and weakness.  She was found to have displaced odontoid fracture with severe cervical spinal stenosis C1/C2 and C3/C4. She underwent C1 laminectomy for decompression fo spinal cord with ORIF C2 fracture and PLF occiput to C4 by Dr. Conchita ParisNundkumar on 02/06/19.  She was making good progress but severe neck pain 5/2 with  BLE instability. Follow up CT showed C2 laminar screw intruding into posterior spinal canal and right lateral recess. She was taken to OR for removal of C2 translaminar screw on 5/5 but continued to have decline in BUE strength and worsening of numbness.   Follow up MRI spine 5/6 showed removal of C2 screw with continue anterolisthesis of C1 on nonunited dens fragment with continued canal stenosis and flattening of cord with abnormal T2 cord signal and surrounding edema. Symptoms felt to be due to edema and she was started on medrol dose pack. Therapy ongoing but patient continues to be limited by cervical myelopathy with weakness BUE/BLE, decreased initiation, anxiety and pain affecting ADLs and mobility.  CIR recommended due to functional decline. .    Review of Systems  Constitutional: Negative for chills and fever.  HENT: Negative for hearing loss and tinnitus.   Eyes: Negative for blurred vision and double vision.  Respiratory: Negative for cough and shortness of breath.   Cardiovascular: Negative for chest pain and palpitations.  Gastrointestinal: Positive for constipation (hasn't had BM since admission). Negative for heartburn and nausea.  Genitourinary: Positive for frequency.       Incontinent of bladder  for years--wears pads with depends. Rectal pain/spasms.  Musculoskeletal: Negative for joint pain and myalgias.  Skin: Negative for rash.  Neurological: Positive for sensory change and weakness. Negative for dizziness and headaches.  Psychiatric/Behavioral: Negative for memory loss.     Past Medical History:  Diagnosis Date  . Allergic rhinitis   . Anosmia   . Arthritis   . Full dentures   . History of diverticulitis   . Insomnia   . Insomnia   . Senile osteoporosis   . SOB (shortness of breath)   . SVT (supraventricular tachycardia) (HCC)   . Varicose vein of leg    left  . Wears glasses     Past Surgical History:  Procedure Laterality Date  . COLON RESECTION  1989   diverticulitis  . COLONOSCOPY    . ESOPHAGEAL DILATION  2015   endoscopy  . POSTERIOR CERVICAL FUSION/FORAMINOTOMY N/A 02/11/2019   Procedure: POSTERIOR CERVICAL INSTRUMENTATION REMOVAL CERVICAL TWO;  Surgeon: Lisbeth RenshawNundkumar, Neelesh, MD;  Location: [facility] OR;  Service: Neurosurgery;  Laterality: N/A;  . POSTERIOR CERVICAL LAMINECTOMY N/A 02/06/2019   Procedure: Cervical one LAMINECTOMY, Occiput-Cervical four fusion;  Surgeon: Lisbeth RenshawNundkumar, Neelesh, MD;  Location: [facility] OR;  Service: Neurosurgery;  Laterality: N/A;    Family History  Problem Relation Age of Onset  . Cancer Mother   . High blood pressure Father   . Emphysema Brother      Social History: Lives with family. She reports that she has never smoked. She has never used smokeless tobacco. She reports current alcohol on rate occasions.  She reports that she does not use drugs.    Allergies  Allergen Reactions  . Penicillins Hives, Swelling, Rash and Other (See Comments)    Did it involve swelling of the face/tongue/throat, SOB, or low BP? #  #  #  YES  #  #  #  Did it involve sudden or severe rash/hives, skin peeling, or any reaction on the inside of your mouth or nose? No Did you need to seek medical attention at a hospital or doctor's office? #  #  #  YES  #   #  #  When did it last happen?85 years old.   . Codeine Phosphate [Codeine] Other (See Comments)    headache    Medications Prior to Admission  Medication Sig Dispense Refill  . calcium-vitamin D (OSCAL WITH D) 500-200 MG-UNIT per tablet Take 1 tablet by mouth 2 (two) times daily.     Marland Kitchen denosumab (PROLIA) 60 MG/ML SOLN injection Inject 60 mg into the skin every 6 (six) months. Administer in upper arm, thigh, or abdomen    . metoprolol succinate (TOPROL-XL) 25 MG 24 hr tablet Take 12.5 mg by mouth daily.    . rosuvastatin (CRESTOR) 5 MG tablet Take 5 mg by mouth daily.    . traMADol (ULTRAM) 50 MG tablet Take 50 mg by mouth every 6 (six) hours as needed for moderate pain.     . traZODone (DESYREL) 50 MG tablet Take 50 mg by mouth at bedtime as needed for sleep.       Drug Regimen Review  Drug regimen was reviewed and remains appropriate with no significant issues identified  Home: Home Living Family/patient expects to be discharged to:: Private residence Living Arrangements: Children, Other relatives Available Help at Discharge: Family, Available 24 hours/day Type of Home: House Home Access: Level entry Home Layout: Other (Comment)(two steps (with rail) to dining room/"sitting room") Bathroom Shower/Tub: Walk-in shower Home Equipment: Information systems manager, Medical laboratory scientific officer - single point, Environmental consultant - 2 wheels Additional Comments: has son and daughter to (A)   Functional History: Prior Function Level of Independence: Independent Comments: drives, enjoys walking  Functional Status:  Mobility: Bed Mobility Overal bed mobility: Needs Assistance Bed Mobility: Rolling, Sidelying to Sit, Sit to Sidelying Rolling: Mod assist Sidelying to sit: Mod assist, +2 for safety/equipment Supine to sit: Mod assist Sit to supine: Mod assist Sit to sidelying: Max assist, +2 for physical assistance General bed mobility comments: cues for sequencing; pt is able to bring bilat LE to EOB; assist to elevate trunk  into sitting and scoot hips with bed pad Transfers Overall transfer level: Needs assistance Equipment used: 2 person hand held assist Transfers: Sit to/from Stand Sit to Stand: +2 physical assistance, Max assist, Mod assist Stand pivot transfers: Max assist, Total assist, +2 physical assistance General transfer comment: bilat LE blocked for safety; assist with use of gait belt and bed pad to power up into standing and facitliation at glutes for hip extension; pt with flexed posture  Ambulation/Gait Ambulation/Gait assistance: Mod assist Gait Distance (Feet): 2 Feet Assistive device: Rolling walker (2 wheeled) Gait Pattern/deviations: Step-to pattern(side steps) General Gait Details: unable to progress to gait training this session Gait velocity: decreased    ADL: ADL Overall ADL's : Needs assistance/impaired Eating/Feeding: Total assistance, Bed level Eating/Feeding Details (indicate cue type and reason): Pt now unable to bring utensil to her mouth today.  She demonstrates significant difficulty targeting her mouth even using curved utensil  Grooming: Wash/dry hands, Therapist, nutritional, Oral  care, Applying deodorant, Maximal assistance, Bed level Upper Body Bathing: Moderate assistance Lower Body Bathing: Total assistance, +2 for physical assistance, Sit to/from stand Upper Body Dressing : Maximal assistance, Bed level Lower Body Dressing: Total assistance, Bed level Toilet Transfer: Moderate assistance, +2 for physical assistance, +2 for safety/equipment Toilet Transfer Details (indicate cue type and reason): simulated during functional mobility (sit/stand bedside) Toileting- Clothing Manipulation and Hygiene: Total assistance, +2 for physical assistance, Sit to/from stand Toileting - Clothing Manipulation Details (indicate cue type and reason): simulated during functional mobility (sit/stand bedside) Functional mobility during ADLs: Minimal assistance, Rolling walker General ADL  Comments: pt limited by decreased functional use of B UES, impaired balance and decreased activity tolerance  Cognition: Cognition Overall Cognitive Status: Within Functional Limits for tasks assessed Orientation Level: Oriented X4 Cognition Arousal/Alertness: Awake/alert Behavior During Therapy: WFL for tasks assessed/performed Overall Cognitive Status: Within Functional Limits for tasks assessed Area of Impairment: Problem solving Safety/Judgement: Decreased awareness of deficits Problem Solving: Difficulty sequencing, Requires tactile cues, Requires verbal cues, Slow processing, Decreased initiation General Comments: WFL for simple tasks during session   Blood pressure 129/68, pulse 89, temperature 98.9 F (37.2 C), temperature source Oral, resp. rate 17, height 5\' 3"  (1.6 m), weight 65.8 kg, SpO2 97 %. Physical Exam  Nursing note and vitals reviewed. Constitutional: She is oriented to person, place, and time. She appears well-developed and well-nourished.  HENT:  Head: Normocephalic.  Eyes: Pupils are equal, round, and reactive to light. EOM are normal.  Neck:  Immobilized by collar  Cardiovascular: Normal rate and regular rhythm. Exam reveals no friction rub.  No murmur heard. Respiratory: Effort normal. No stridor. No respiratory distress. She has no wheezes. She has no rales.  GI: Soft. She exhibits no distension. There is no abdominal tenderness. There is no rebound.  Musculoskeletal:        General: No tenderness or edema.  Neurological: She is alert and oriented to person, place, and time. No cranial nerve deficit.  Good insight and awareness. Normal language, memory. RUE 1+ prox to distal. LUE 2 to 2+/5 prox to distal. RLE 3/5 prox to distal. LLE 3-4/5 prox to distal. Decreased LT and pain although senses gross touch and pain below shoulders bilaterally. DTR's 2+ UE and 3+ LE's.   Skin: Skin is warm and dry.    No results found for this or any previous visit (from the  past 48 hour(s)). No results found.     Medical Problem List and Plan: 1.  Functional and mobility deficits secondary to C2 fracture and 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 Pt is s/p C1 laminectomy, C2 ORIF and Occiput to C4 PLIF  -admit to inpatient rehab 2.  Antithrombotics: -DVT/anticoagulation:  Mechanical: Sequential compression devices, below knee Bilateral lower extremitiesCheck dopplers.   -antiplatelet therapy: N/A 3. Pain Management: Oxycodone prn--premedicate prior to therapy. Gabapentin tid for neuropathy 4. Mood: Team to provide ego support. LCSW to follow for evaluation and support.   -antipsychotic agents: N/A 5. Neuropsych: This patient is capable of making decisions on her own behalf. 6. Skin/Wound Care: Will order air matress overlay for pressure relief.  7. Fluids/Electrolytes/Nutrition: Needs assistance with meals. Monitor I/O. Check lytes Monday 8. H/o SVT: Continue metoprolol daily 9.  Neurogenic bowel: Start bowel regime with daily suppositories.  10.  Neurogenic bladder: Has been incontinent for years. Will monitor PVRs- start bladder program.         Jacquelynn Cree, PA-C 02/14/2019

## 2019-02-13 NOTE — Progress Notes (Signed)
Physical Therapy Treatment Patient Details Name: Denise Espinoza MRN: [id number] DOB: 1930/02/08 Today's Date: 02/13/2019    History of Present Illness 85 y/o female s/p ORIF C2 fx, C1 laminectomy for decompression of spinal cord and posterolateral fusion from occiput to C4. S/P removal of L  C2 translaminar screw 5/5. PMH including but not limited to HTN and HLD.    PT Comments    Patient seen for mobility progression. Pt continues to present with generalized weakness and very limited use of bilat UE. Pt requires +2 assist for bed mobility and functional transfer training.  Continue to recommend further skilled PT services both acute and post acute to maximize independence and safety with mobility.    Follow Up Recommendations  CIR;Supervision/Assistance - 24 hour     Equipment Recommendations  None recommended by PT    Recommendations for Other Services       Precautions / Restrictions Precautions Precautions: Fall;Cervical Precaution Comments: cervical collar on at all times  Required Braces or Orthoses: Cervical Brace Cervical Brace: Hard collar;At all times Restrictions Weight Bearing Restrictions: No    Mobility  Bed Mobility Overal bed mobility: Needs Assistance Bed Mobility: Rolling;Sidelying to Sit;Sit to Sidelying Rolling: Mod assist Sidelying to sit: Mod assist;+2 for safety/equipment     Sit to sidelying: Max assist;+2 for physical assistance General bed mobility comments: cues for sequencing; pt is able to bring bilat LE to EOB; assist to elevate trunk into sitting and scoot hips with bed pad  Transfers Overall transfer level: Needs assistance Equipment used: 2 person hand held assist Transfers: Sit to/from Stand;Stand Pivot Transfers Sit to Stand: +2 physical assistance;+2 safety/equipment;Max assist Stand pivot transfers: Max assist;Total assist;+2 physical assistance       General transfer comment: pt required increased assistance for second stand pivot  BSC to EOB; knees blocked and +2 face to face with gait belt used to stand  Ambulation/Gait             General Gait Details: unable to progress to gait training this session   Stairs             Wheelchair Mobility    Modified Rankin (Stroke Patients Only)       Balance Overall balance assessment: Needs assistance Sitting-balance support: Feet supported Sitting balance-Leahy Scale: Poor Sitting balance - Comments: mod A required to maintain sitting balance EOB     Standing balance-Leahy Scale: Zero                              Cognition Arousal/Alertness: Awake/alert Behavior During Therapy: WFL for tasks assessed/performed Overall Cognitive Status: No family/caregiver present to determine baseline cognitive functioning Area of Impairment: Problem solving                             Problem Solving: Difficulty sequencing;Requires tactile cues;Requires verbal cues;Slow processing;Decreased initiation        Exercises      General Comments        Pertinent Vitals/Pain Pain Assessment: Faces Faces Pain Scale: Hurts a little bit Pain Location: neck Pain Descriptors / Indicators: Aching;Grimacing Pain Intervention(s): Limited activity within patient's tolerance;Monitored during session;Repositioned    Home Living                      Prior Function            PT Goals (  current goals can now be found in the care plan section) Acute Rehab PT Goals Patient Stated Goal: decrease pain, return to independence Progress towards PT goals: Not progressing toward goals - comment    Frequency    Min 5X/week      PT Plan Current plan remains appropriate    Co-evaluation              AM-PAC PT "6 Clicks" Mobility   Outcome Measure  Help needed turning from your back to your side while in a flat bed without using bedrails?: Total Help needed moving from lying on your back to sitting on the side of a flat bed  without using bedrails?: Total Help needed moving to and from a bed to a chair (including a wheelchair)?: A Lot Help needed standing up from a chair using your arms (e.g., wheelchair or bedside chair)?: A Lot Help needed to walk in hospital room?: Total Help needed climbing 3-5 steps with a railing? : Total 6 Click Score: 8    End of Session Equipment Utilized During Treatment: Gait belt;Cervical collar Activity Tolerance: Patient tolerated treatment well Patient left: in bed;with call bell/phone within reach;with bed alarm set;Other (comment)(pt has soft touch call bell) Nurse Communication: Mobility status PT Visit Diagnosis: Other abnormalities of gait and mobility (R26.89);Pain     Time: 6153-7943 PT Time Calculation (min) (ACUTE ONLY): 41 min  Charges:  $Gait Training: 8-22 mins $Therapeutic Activity: 8-22 mins                     Erline Levine, PTA Acute Rehabilitation Services Pager: [phone] Office: [phone]     Carolynne Edouard 02/13/2019, 4:11 PM

## 2019-02-13 NOTE — Progress Notes (Signed)
Inpatient Rehabilitation-Admissions Coordinator   Awaiting call back from family regarding caregiver support. Will follow up once family calls back.   Nanine Means, OTR/L  Rehab Admissions Coordinator  [phone] 02/13/2019 2:06 PM

## 2019-02-14 ENCOUNTER — Encounter (HOSPITAL_COMMUNITY): Payer: Self-pay | Admitting: Physical Medicine and Rehabilitation

## 2019-02-14 NOTE — Progress Notes (Signed)
Inpatient Rehabilitation-Admissions Coordinator   Pt wants to pursue CIR at this time. Plan to admit to CIR tomorrow, 5/9, pending medical clearance by Attending service as well as PM&R admitting MD, Dr. Riley Kill.   Will follow up tomorrow.   Nanine Means, OTR/L  Rehab Admissions Coordinator  [phone] 02/14/2019 3:45 PM

## 2019-02-14 NOTE — PMR Pre-admission (Signed)
PMR Admission Coordinator Pre-Admission Assessment  Patient: Denise Espinoza is an 85 y.o., female MRN: [id number] DOB: 1929/12/04 Height: '5\' 3"'$  (160 cm) Weight: 65.8 kg  Insurance Information HMO:     PPO:      PCP:      IPA:      80/20: Yes     OTHER:  PRIMARY: Medicare Part A and B      Policy#: [id number]      Subscriber: Patient CM Name:       Phone#:      Fax#:  Pre-Cert#:       Employer:  Benefits:  Phone #: NA     Name: verified eligibility via [place] on 02/13/19 Eff. Date: part A 01/07/97; part B 03/10/1999     Deduct: $1,408.00      Out of Pocket Max: NA      Life Max: NA CIR: Covered per Medicare guidelines once yearly deductible is met      SNF: days 1-20, 100%, days 21-100, 80% Outpatient: 80%     Co-Pay: 20% Home Health: 100%      Co-Pay:  DME: 80%     Co-Pay: 20% Providers: Pt's choice [employer]      Policy#: [id number]      Subscriber: Patient CM Name:       Phone#:      Fax#:  Pre-Cert#:       Employer:  Benefits:  Phone #: [phone]     Name:  Eff. Date:      Deduct:       Out of Pocket Max:       Life Max: CIR:       SNF:  Outpatient:      Co-Pay:  Home Health:       Co-Pay:  DME:      Co-Pay:   Medicaid Application Date:       Case Manager:  Disability Application Date:       Case Worker:   The "Data Collection Information Summary" for patients in Inpatient Rehabilitation Facilities with attached "Privacy Act [place] Records" was provided and verbally reviewed with: Patient  Emergency Contact Information Contact Information    Name Relation Home Work Mobile   Jordan,Bobby Niece   [phone]   blackstock,alexandria Granddaughter   [phone]   Blackstock,Kim Daughter [phone]  [phone]   Freddy Jaksch  [phone]        Current Medical History  Patient Admitting Diagnosis: cervical myelopathy s/p posterior cervical decompression and fusion with subsequent repeat surgery for removal of C2 screw.  History of Present  Illness: Denise Espinoza is an 85 year old female with history of HTN, anosmia, SVT, few months history of BLE instability  who was admitted via [facility] on 02/04/19 with reports of fall 3 weeks PTA with onset of RUE>LUE numbness and weakness.  She was found to have displaced odontoid fracture with severe cervical spinal stenosis C1/C2 and C3/C4. She underwent C1 laminectomy for decompression fo spinal cord with ORIF C2 fracture and PLF occiput to C4 by Dr. Kathyrn Sheriff on 02/06/19.  She was making good progress but severe neck pain 5/2 with  BLE instability. Follow up CT showed C2 laminar screw intruding into posterior spinal canal and right lateral recess. She was taken to OR for removal of C2 translaminar screw on 5/5 but continued to have decline in BUE strength and worsening of numbness.   Follow up MRI spine 5/6 showed removal of C2 screw with continue anterolisthesis of  C1 on nonunited dens fragment with continued canal stenosis and flattening of cord with abnormal T2 cord signal and surrounding edema. Symptoms felt to be due to edema and she was started on medrol dose pack. Therapy ongoing but patient continues to be limited by cervical myelopathy with weakness BUE/BLE, cognitive deficits with delayed processing, decreased initiation as well as deficits in problem solving affecting ADLs and mobility.  CIR recommended due to functional decline. Pt is to admit to CIR on 02/15/19 with plan to decrease burden of care in prep for SNF placement.      Patient's medical record from [facility] has been reviewed by the rehabilitation admission coordinator and physician.  Past Medical History  Past Medical History:  Diagnosis Date  . Allergic rhinitis   . Anosmia   . Arthritis   . Full dentures   . History of diverticulitis   . Insomnia   . Insomnia   . Senile osteoporosis   . SOB (shortness of breath)   . SVT (supraventricular tachycardia) ([place])   . Varicose vein of leg    left  . Wears  glasses     Family History   family history includes Cancer in her mother; Emphysema in her brother; High blood pressure in her father.  Prior Rehab/Hospitalizations Has the patient had prior rehab or hospitalizations prior to admission? No  Has the patient had major surgery during 100 days prior to admission? Yes   Current Medications  Current Facility-Administered Medications:  .  0.9 %  sodium chloride infusion, 250 mL, Intravenous, PRN, Costella, Vincent J, PA-C .  0.9 %  sodium chloride infusion, , Intravenous, Continuous, Costella, Vista Mink, PA-C, Last Rate: 75 mL/hr at 02/14/19 2358 .  0.9 %  sodium chloride infusion, , Intravenous, Continuous, Costella, Vincent J, PA-C .  acetaminophen (TYLENOL) tablet 650 mg, 650 mg, Oral, Q4H PRN, 650 mg at 02/08/19 2144 **OR** acetaminophen (TYLENOL) suppository 650 mg, 650 mg, Rectal, Q4H PRN, Costella, Vincent J, PA-C .  bisacodyl (DULCOLAX) suppository 10 mg, 10 mg, Rectal, Daily PRN, Costella, Vincent J, PA-C, 10 mg at 02/13/19 0918 .  calcium-vitamin D (OSCAL WITH D) 500-200 MG-UNIT per tablet 1 tablet, 1 tablet, Oral, BID, Costella, Vista Mink, PA-C, 1 tablet at 02/14/19 2105 .  docusate sodium (COLACE) capsule 100 mg, 100 mg, Oral, BID, Costella, Vincent J, PA-C, 100 mg at 02/14/19 2105 .  gabapentin (NEURONTIN) capsule 300 mg, 300 mg, Oral, TID, Costella, Vincent J, PA-C, 300 mg at 02/14/19 2105 .  HYDROcodone-acetaminophen (NORCO/VICODIN) 5-325 MG per tablet 1 tablet, 1 tablet, Oral, Q4H PRN, Costella, Vista Mink, PA-C, 1 tablet at 02/14/19 1721 .  HYDROmorphone (DILAUDID) injection 0.5-1 mg, 0.5-1 mg, Intravenous, Q2H PRN, Costella, Vincent J, PA-C, 0.5 mg at 02/08/19 1150 .  lactated ringers infusion, , Intravenous, Continuous, Ellender, Karyl Kinnier, MD, Last Rate: 10 mL/hr at 02/11/19 0957 .  lip balm (CARMEX) ointment, , Topical, PRN, Consuella Lose, MD .  menthol-cetylpyridinium (CEPACOL) lozenge 3 mg, 1 lozenge, Oral, PRN **OR**  phenol (CHLORASEPTIC) mouth spray 1 spray, 1 spray, Mouth/Throat, PRN, Costella, Vincent J, PA-C .  methocarbamol (ROBAXIN) tablet 500 mg, 500 mg, Oral, Q6H PRN, 500 mg at 02/10/19 2327 **OR** methocarbamol (ROBAXIN) 500 mg in dextrose 5 % 50 mL IVPB, 500 mg, Intravenous, Q6H PRN, Costella, Vincent J, PA-C .  metoprolol succinate (TOPROL-XL) 24 hr tablet 12.5 mg, 12.5 mg, Oral, Daily, Costella, Vincent J, PA-C, 12.5 mg at 02/14/19 0959 .  ondansetron (ZOFRAN) tablet  4 mg, 4 mg, Oral, Q6H PRN **OR** ondansetron (ZOFRAN) injection 4 mg, 4 mg, Intravenous, Q6H PRN, Costella, Vincent J, PA-C, 4 mg at 02/11/19 1627 .  oxyCODONE (Oxy IR/ROXICODONE) immediate release tablet 5-10 mg, 5-10 mg, Oral, Q3H PRN, Costella, Vincent J, PA-C, 10 mg at 02/15/19 4742 .  pantoprazole (PROTONIX) EC tablet 40 mg, 40 mg, Oral, BID, Kary Kos, MD, 40 mg at 02/14/19 2105 .  rosuvastatin (CRESTOR) tablet 5 mg, 5 mg, Oral, q1800, Costella, Vincent J, PA-C, 5 mg at 02/14/19 1721 .  senna (SENOKOT) tablet 8.6 mg, 1 tablet, Oral, BID, Costella, Vincent J, PA-C, 8.6 mg at 02/14/19 2105 .  senna-docusate (Senokot-S) tablet 1 tablet, 1 tablet, Oral, QHS PRN, Costella, Vincent J, PA-C .  sodium chloride flush (NS) 0.9 % injection 3 mL, 3 mL, Intravenous, Q12H, Costella, Vincent J, PA-C, 3 mL at 02/14/19 2106 .  sodium chloride flush (NS) 0.9 % injection 3 mL, 3 mL, Intravenous, PRN, Costella, Vincent J, PA-C, 3 mL at 02/10/19 2226 .  sodium phosphate (FLEET) 7-19 GM/118ML enema 1 enema, 1 enema, Rectal, Once PRN, Costella, Vista Mink, PA-C .  traMADol (ULTRAM) tablet 50 mg, 50 mg, Oral, Q6H PRN, Costella, Vista Mink, PA-C, 50 mg at 02/09/19 2127 .  traZODone (DESYREL) tablet 50 mg, 50 mg, Oral, QHS PRN, Costella, Vincent J, PA-C, 50 mg at 02/15/19 0008 .  zolpidem (AMBIEN) tablet 5 mg, 5 mg, Oral, QHS PRN, Costella, Vincent J, PA-C, 5 mg at 02/13/19 2147  Patients Current Diet:  Diet Order            Diet regular Room service  appropriate? Yes; Fluid consistency: Thin  Diet effective now              Precautions / Restrictions Precautions Precautions: Fall, Cervical Precaution Comments: cervical collar on at all times  Cervical Brace: Hard collar, At all times Restrictions Weight Bearing Restrictions: No   Has the patient had 2 or more falls or a fall with injury in the past year? Yes  Prior Activity Level Community (5-7x/wk): active PTA; drove PTA; Independent PTA (did use RW past 6 weeks since fall)  Prior Functional Level Self Care: Did the patient need help bathing, dressing, using the toilet or eating? Independent  Indoor Mobility: Did the patient need assistance with walking from room to room (with or without device)? Independent  Stairs: Did the patient need assistance with internal or external stairs (with or without device)? Independent  Functional Cognition: Did the patient need help planning regular tasks such as shopping or remembering to take medications? [place] / Equipment Home Assistive Devices/Equipment: Blood pressure cuff, Walker (specify type) Home Equipment: Shower seat, Cane - single point, Walker - 2 wheels  Prior Device Use: Indicate devices/aids used by the patient prior to current illness, exacerbation or injury? Walker ever since fall (approx 6 weeks prior), no AD use prior to fall.   Current Functional Level Cognition  Overall Cognitive Status: Within Functional Limits for tasks assessed Orientation Level: Oriented X4 Safety/Judgement: Decreased awareness of deficits General Comments: WFL for simple tasks during session    Extremity Assessment (includes Sensation/Coordination)  Upper Extremity Assessment: RUE deficits/detail, LUE deficits/detail RUE Deficits / Details: Pt demonstrates shoulder flexion 1+/5; elbow 1+/5, wrist 3-/5, hand 3-/5, arthritic deformity  RUE Sensation: decreased light touch, decreased proprioception RUE  Coordination: decreased fine motor, decreased gross motor LUE Deficits / Details: Pt demonstrates Lt shoulder 2-/5; elbow 3/5; wrist 3/5, hand 3+/5  LUE Sensation: decreased light touch, decreased proprioception LUE Coordination: decreased fine motor, decreased gross motor  Lower Extremity Assessment: Defer to PT evaluation    ADLs  Overall ADL's : Needs assistance/impaired Eating/Feeding: Total assistance, Bed level Eating/Feeding Details (indicate cue type and reason): Pt now unable to bring utensil to her mouth today.  She demonstrates significant difficulty targeting her mouth even using curved utensil  Grooming: Wash/dry hands, Wash/dry face, Oral care, Applying deodorant, Maximal assistance, Bed level Upper Body Bathing: Moderate assistance Lower Body Bathing: Total assistance, +2 for physical assistance, Sit to/from stand Upper Body Dressing : Maximal assistance, Bed level Lower Body Dressing: Total assistance, Bed level Toilet Transfer: Moderate assistance, +2 for physical assistance, +2 for safety/equipment Toilet Transfer Details (indicate cue type and reason): simulated during functional mobility (sit/stand bedside) Toileting- Clothing Manipulation and Hygiene: Total assistance, +2 for physical assistance, Sit to/from stand Toileting - Clothing Manipulation Details (indicate cue type and reason): simulated during functional mobility (sit/stand bedside) Functional mobility during ADLs: Minimal assistance, Rolling walker General ADL Comments: pt limited by decreased functional use of B UES, impaired balance and decreased activity tolerance    Mobility  Overal bed mobility: Needs Assistance Bed Mobility: Rolling, Sidelying to Sit, Sit to Sidelying Rolling: Mod assist Sidelying to sit: Mod assist, +2 for safety/equipment Supine to sit: Mod assist Sit to supine: Mod assist Sit to sidelying: Max assist, +2 for physical assistance General bed mobility comments: cues for sequencing;  pt is able to bring bilat LE to EOB; assist to elevate trunk into sitting and scoot hips with bed pad    Transfers  Overall transfer level: Needs assistance Equipment used: 2 person hand held assist Transfers: Sit to/from Stand Sit to Stand: +2 physical assistance, Max assist, Mod assist Stand pivot transfers: Max assist, Total assist, +2 physical assistance General transfer comment: bilat LE blocked for safety; assist with use of gait belt and bed pad to power up into standing and facitliation at glutes for hip extension; pt with flexed posture     Ambulation / Gait / Stairs / Wheelchair Mobility  Ambulation/Gait Ambulation/Gait assistance: Mod assist Gait Distance (Feet): 2 Feet Assistive device: Rolling walker (2 wheeled) Gait Pattern/deviations: Step-to pattern(side steps) General Gait Details: unable to progress to gait training this session Gait velocity: decreased    Posture / Balance Dynamic Sitting Balance Sitting balance - Comments: worked on sitting balance EOB with pt able to correct posture but only able to maintain briefly; pt with posterior bias  Balance Overall balance assessment: Needs assistance Sitting-balance support: Feet supported Sitting balance-Leahy Scale: Poor Sitting balance - Comments: worked on sitting balance EOB with pt able to correct posture but only able to maintain briefly; pt with posterior bias  Postural control: Posterior lean Standing balance support: During functional activity, Bilateral upper extremity supported Standing balance-Leahy Scale: Zero Standing balance comment: +2 assist to maintain standing balance; pt is able to increase hip extension in standing with cues but not able to maintain without assistance     Special needs/care consideration BiPAP/CPAP : no CPM : no Continuous Drip IV : 0.9% sodium chloride infusion Dialysis : no        Days : no Life Vest : no Oxygen : no Special Bed : no Trach Size : no Wound Vac (area) : no       Location  :no Skin: ecchymosis on bilateral arms  Bowel mgmt:last BM: 02/14/19 (constipated prior to this most recent BM-per pt, had to be disimpacted with assistance).   Bladder mgmt: incontinent, use of purwik (incontient prior) Diabetic mgmt: no Behavioral consideration : no Chemo/radiation : no   Previous Home Environment (from acute therapy documentation) Living Arrangements: Children, Other relatives Available Help at Discharge: Family, Available 24 hours/day Type of Home: House Home Layout: Other (Comment)(two steps (with rail) to dining room/"sitting room") Home Access: Level entry Bathroom Shower/Tub: Walk-in shower Home Care Services: No Additional Comments: has son and daughter to (A)  Discharge Living Setting Plans for Discharge Living Setting: Other (Comment)(SNF) Type of Home at Discharge: [place] Name at Discharge: TBD Discharge Home Layout: Other (Comment)(per care facility) Does the patient have any problems obtaining your medications?: No *Pt lives in a 1 story house with family. It is a level entry with 2 steps required once in the house to access the living room and kitchen areas. Pt has a walk in shower and elevated commode. Bathroom is accessible via RW. Anticipated pt to DC to SNF.    Social/Family/Support Systems Patient Roles: Other (Comment)(lived with family PTA) Contact Information: Freddy Jaksch (step daugther) is emergency contact per pt request: [phone]; Maudie Mercury (daughter): [phone] Anticipated Caregiver: SNF Anticipated Caregiver's Contact Information: see above Ability/Limitations of Caregiver: per SNF Does Caregiver/Family have Issues with Lodging/Transportation while Pt is in Rehab?: No  Goals/Additional Needs Patient/Family Goal for Rehab: PT/OT: Mod/Max A; SLP: NA Expected length of stay: 16-20 days Cultural Considerations: NA  Dietary Needs: regular, thin liquids (pt is a feeder) Equipment  Needs: TBD Special Service Needs: SCI team Pt/Family Agrees to Admission and willing to participate: Yes Program Orientation Provided & Reviewed with Pt/Caregiver Including Roles  & Responsibilities: Yes(pt), step daughter Jeannene Patella, and daughter Maudie Mercury   Barriers to Discharge: Decreased caregiver support, Home environment access/layout, Incontinence, Lack of/limited family support  Barriers to Discharge Comments: pt does not have 24/7 reliable support. Home set up is barrier (2 steps to get to kitchen).  Decrease burden of Care through IP rehab admission: Specialzed equipment needs, Decrease number of caregivers, Bowel and bladder program and Patient/family education, self feeding, bed level pressure relief for skin integrity, adaptation for call bell utilization, positioning.   Possible need for SNF placement upon discharge: Yes, pt aware of SNF needs at DC as she does not have reliable 24/7 A at home; have spoken to step-daughter (pt's designated member on who to contact), and daughter Maudie Mercury) who is in agreement for plan for SNF once ready after short CIR stay.  Patient Condition: I have reviewed medical records from [facility], spoken with CSW, and patient and family member. I met with patient at the bedside for inpatient rehabilitation assessment.  Patient will benefit from ongoing PT and OT, can actively participate in 3 hours of therapy a day 5 days of the week, and can make measurable gains during the admission.  Patient will also benefit from the coordinated team approach during an Inpatient Acute Rehabilitation admission.  The patient will receive intensive therapy as well as Rehabilitation physician, nursing, social worker, and care management interventions.  Due to bladder management, bowel management, safety, skin/wound care, disease management, medication administration, pain management and patient education the patient requires 24 hour a day rehabilitation nursing.  The patient is  currently Mod/Max A x2 for transfers and Mod A x2 to total A for basic ADLs.  Discharge setting and therapy post discharge at skilled nursing facility  is anticipated.  Patient has agreed to participate in the Acute Inpatient Rehabilitation Program and will admit 02/15/19.  Preadmission Screen Completed By:  Jhonnie Garner, 02/15/2019 9:55 AM ______________________________________________________________________   Discussed status with Dr. Naaman Plummer on 02/14/19 at 4:16PM and received approval for admission 02/15/19.  Admission Coordinator:  Jhonnie Garner, OT, time 8:48AM, Date 02/15/19.  Assessment/Plan: Diagnosis:cervical myelopathy 1. Does the need for close, 24 hr/day Medical supervision in concert with the patient's rehab needs make it unreasonable for this patient to be served in a less intensive setting? Yes 2. Co-Morbidities requiring supervision/potential complications: htn, pain mgt, neurogenic bowel and bladder 3. Due to bladder management, bowel management, safety, skin/wound care, disease management, medication administration, pain management and patient education, does the patient require 24 hr/day rehab nursing? Yes 4. Does the patient require coordinated care of a physician, rehab nurse, PT (1-2 hrs/day, 5 days/week) and OT (1-2 hrs/day, 5 days/week) to address physical and functional deficits in the context of the above medical diagnosis(es)? Yes Addressing deficits in the following areas: balance, endurance, locomotion, strength, transferring, bowel/bladder control, bathing, dressing, feeding, grooming, toileting and psychosocial support 5. Can the patient actively participate in an intensive therapy program of at least 3 hrs of therapy 5 days a week? Yes 6. The potential for patient to make measurable gains while on inpatient rehab is good 7. Anticipated functional outcomes upon discharge from inpatients are: mod assist and max assist PT, mod assist and max assist OT, n/a SLP--reduce burden of  care 8. Estimated rehab length of stay to reach the above functional goals is: 15-20 days 9. Anticipated D/C setting: Other 10. Anticipated post D/C treatments: N/A 11. Overall Rehab/Functional Prognosis: good and fair  MD Signature: Meredith Staggers, MD, [facility] 02/15/2019

## 2019-02-14 NOTE — Progress Notes (Signed)
Physical Therapy Treatment Patient Details Name: Denise Espinoza MRN: [id number] DOB: 01-Dec-1929 Today's Date: 02/14/2019    History of Present Illness 85 y/o female s/p ORIF C2 fx, C1 laminectomy for decompression of spinal cord and posterolateral fusion from occiput to C4. S/P removal of L  C2 translaminar screw 5/5. PMH including but not limited to HTN and HLD.    PT Comments    Patient seen for mobility progression. Pt is pleasant and agreeable to therapy. Pt continues to present with decreased sensation in all four extremities and generalized weakness with significant bilat UE weakness. Pt requires mod-max A +2 for bed mobility and sit to stand transfers this session. 7/10 pain with mobility. Continue to progress as tolerated.     Follow Up Recommendations  CIR;Supervision/Assistance - 24 hour     Equipment Recommendations  None recommended by PT    Recommendations for Other Services       Precautions / Restrictions Precautions Precautions: Fall;Cervical Precaution Comments: cervical collar on at all times  Required Braces or Orthoses: Cervical Brace Cervical Brace: Hard collar;At all times    Mobility  Bed Mobility Overal bed mobility: Needs Assistance Bed Mobility: Rolling;Sidelying to Sit;Sit to Sidelying Rolling: Mod assist Sidelying to sit: Mod assist;+2 for safety/equipment     Sit to sidelying: Max assist;+2 for physical assistance General bed mobility comments: cues for sequencing; pt is able to bring bilat LE to EOB; assist to elevate trunk into sitting and scoot hips with bed pad  Transfers Overall transfer level: Needs assistance Equipment used: 2 person hand held assist Transfers: Sit to/from Stand Sit to Stand: +2 physical assistance;Max assist;Mod assist         General transfer comment: bilat LE blocked for safety; assist with use of gait belt and bed pad to power up into standing and facitliation at glutes for hip extension; pt with flexed posture    Ambulation/Gait             General Gait Details: unable to progress to gait training this session   Stairs             Wheelchair Mobility    Modified Rankin (Stroke Patients Only)       Balance Overall balance assessment: Needs assistance Sitting-balance support: Feet supported Sitting balance-Leahy Scale: Poor Sitting balance - Comments: worked on sitting balance EOB with pt able to correct posture but only able to maintain briefly; pt with posterior bias  Postural control: Posterior lean   Standing balance-Leahy Scale: Zero Standing balance comment: +2 assist to maintain standing balance; pt is able to increase hip extension in standing with cues but not able to maintain without assistance                             Cognition Arousal/Alertness: Awake/alert Behavior During Therapy: WFL for tasks assessed/performed Overall Cognitive Status: Within Functional Limits for tasks assessed                                 General Comments: WFL for simple tasks during session      Exercises      General Comments        Pertinent Vitals/Pain Pain Assessment: 0-10 Pain Score: 7  Pain Location: not specified Pain Descriptors / Indicators: Grimacing;Sore Pain Intervention(s): Limited activity within patient's tolerance;Monitored during session;Repositioned;Other (comment)(RN present to give pain meds end of  session)    Home Living                      Prior Function            PT Goals (current goals can now be found in the care plan section) Progress towards PT goals: Progressing toward goals    Frequency    Min 5X/week      PT Plan Current plan remains appropriate    Co-evaluation PT/OT/SLP Co-Evaluation/Treatment: Yes Reason for Co-Treatment: For patient/therapist safety;To address functional/ADL transfers;Complexity of the patient's impairments (multi-system involvement) PT goals addressed during  session: Mobility/safety with mobility;Balance        AM-PAC PT "6 Clicks" Mobility   Outcome Measure  Help needed turning from your back to your side while in a flat bed without using bedrails?: Total Help needed moving from lying on your back to sitting on the side of a flat bed without using bedrails?: Total Help needed moving to and from a bed to a chair (including a wheelchair)?: A Lot Help needed standing up from a chair using your arms (e.g., wheelchair or bedside chair)?: A Lot Help needed to walk in hospital room?: Total Help needed climbing 3-5 steps with a railing? : Total 6 Click Score: 8    End of Session Equipment Utilized During Treatment: Gait belt;Cervical collar Activity Tolerance: Patient limited by fatigue;Patient limited by pain Patient left: in bed;with call bell/phone within reach;with bed alarm set;Other (comment);with nursing/sitter in room(pt has soft touch call bell) Nurse Communication: Mobility status PT Visit Diagnosis: Other abnormalities of gait and mobility (R26.89);Pain Pain - part of body: (neck )     Time: [id number] PT Time Calculation (min) (ACUTE ONLY): 26 min  Charges:  $Gait Training: 8-22 mins                     Erline LevineKellyn Meilani Edmundson, PTA Acute Rehabilitation Services Pager: [phone] Office: [phone]     Carolynne EdouardKellyn R Mirabel Ahlgren 02/14/2019, 10:24 AM

## 2019-02-14 NOTE — Progress Notes (Signed)
Occupational Therapy Treatment Patient Details Name: Denise Espinoza MRN: [id number] DOB: 12-05-1929 Today's Date: 02/14/2019    History of present illness 85 y/o female s/p ORIF C2 fx, C1 laminectomy for decompression of spinal cord and posterolateral fusion from occiput to C4. S/P removal of L  C2 translaminar screw 5/5. PMH including but not limited to HTN and HLD.   OT comments  Pt. Seen with PT for co-tx. Pt. Is very motivated and eager to participate.  Able to assist with self correction of poor sitting balance but fatigues easily.  Agreeable to sit/stand today (refer to PT notes for details).  Continues with poor ROM or movement in B UES.  Remains CIR candidate as she will benefit from continued intense level therapies.    Follow Up Recommendations  CIR;Supervision/Assistance - 24 hour    Equipment Recommendations  3 in 1 bedside commode    Recommendations for Other Services      Precautions / Restrictions Precautions Precautions: Fall;Cervical Precaution Comments: cervical collar on at all times  Required Braces or Orthoses: Cervical Brace Cervical Brace: Hard collar;At all times       Mobility Bed Mobility Overal bed mobility: Needs Assistance Bed Mobility: Rolling;Sidelying to Sit;Sit to Sidelying Rolling: Mod assist Sidelying to sit: Mod assist;+2 for safety/equipment     Sit to sidelying: Max assist;+2 for physical assistance General bed mobility comments: cues for sequencing; pt is able to bring bilat LE to EOB; assist to elevate trunk into sitting and scoot hips with bed pad  Transfers Overall transfer level: Needs assistance Equipment used: 2 person hand held assist Transfers: Sit to/from Stand Sit to Stand: +2 physical assistance;Max assist;Mod assist Stand pivot transfers: Max assist;Total assist;+2 physical assistance       General transfer comment: bilat LE blocked for safety; assist with use of gait belt and bed pad to power up into standing and  facitliation at glutes for hip extension; pt with flexed posture     Balance Overall balance assessment: Needs assistance Sitting-balance support: Feet supported Sitting balance-Leahy Scale: Poor Sitting balance - Comments: worked on sitting balance EOB with pt able to correct posture but only able to maintain briefly; pt with posterior bias  Postural control: Posterior lean Standing balance support: During functional activity;Bilateral upper extremity supported Standing balance-Leahy Scale: Zero Standing balance comment: +2 assist to maintain standing balance; pt is able to increase hip extension in standing with cues but not able to maintain without assistance                            ADL either performed or assessed with clinical judgement   ADL Overall ADL's : Needs assistance/impaired Eating/Feeding: Total assistance;Bed level                       Toilet Transfer: Moderate assistance;+2 for physical assistance;+2 for safety/equipment Toilet Transfer Details (indicate cue type and reason): simulated during functional mobility (sit/stand bedside) Toileting- Clothing Manipulation and Hygiene: Total assistance;+2 for physical assistance;Sit to/from stand Toileting - Clothing Manipulation Details (indicate cue type and reason): simulated during functional mobility (sit/stand bedside)             Vision       Perception     Praxis      Cognition Arousal/Alertness: Awake/alert Behavior During Therapy: WFL for tasks assessed/performed Overall Cognitive Status: Within Functional Limits for tasks assessed  General Comments: WFL for simple tasks during session        Exercises     Shoulder Instructions       General Comments      Pertinent Vitals/ Pain       Pain Assessment: 0-10 Pain Score: 7  Pain Location: not specified Pain Descriptors / Indicators: Grimacing;Sore Pain Intervention(s):  Limited activity within patient's tolerance;Monitored during session;Repositioned;Other (comment)(RN present to give pain meds end of session)  Home Living                                          Prior Functioning/Environment              Frequency  Min 3X/week        Progress Toward Goals  OT Goals(current goals can now be found in the care plan section)  Progress towards OT goals: Progressing toward goals     Plan Discharge plan remains appropriate;Frequency remains appropriate    Co-evaluation    PT/OT/SLP Co-Evaluation/Treatment: Yes Reason for Co-Treatment: For patient/therapist safety;To address functional/ADL transfers;Complexity of the patient's impairments (multi-system involvement) PT goals addressed during session: Mobility/safety with mobility OT goals addressed during session: ADL's and self-care      AM-PAC OT "6 Clicks" Daily Activity     Outcome Measure                    End of Session Equipment Utilized During Treatment: Cervical collar;Gait belt  OT Visit Diagnosis: Unsteadiness on feet (R26.81);Muscle weakness (generalized) (M62.81)   Activity Tolerance Patient tolerated treatment well   Patient Left in bed;with call bell/phone within reach;with bed alarm set   Nurse Communication Other (comment)(need for purwick to be replaced)        Time: 4854-6270 OT Time Calculation (min): 24 min  Charges: OT General Charges $OT Visit: 1 Visit OT Treatments $Self Care/Home Management : 8-22 mins   Robet Leu, COTA/L 02/14/2019, 12:16 PM

## 2019-02-14 NOTE — Progress Notes (Signed)
  NEUROSURGERY PROGRESS NOTE   No issues overnight.  Strength in BUE waxes/wanes  EXAM:  BP 130/71 (BP Location: Left Arm)   Pulse 77   Temp 98.8 F (37.1 C)   Resp 16   Ht 5\' 3"  (1.6 m)   Wt 65.8 kg   SpO2 100%   BMI 25.69 kg/m   Awake, alert, oriented  Speech fluent, appropriate  CN grossly intact LUE:4-/5 grip,3/5 bicep/tricep, 2/5 deltoid RUE: 2/5 grip, unable to activate bicep/tricep/deltoid 5/5 BLE  IMPRESSION/PLAN 85 y.o.females/p posterior cervical decompression and fusion with subsequent repeat surgery for removal of C2 screw. No change neurologically. - Dispo planning

## 2019-02-15 ENCOUNTER — Encounter (HOSPITAL_COMMUNITY): Payer: Self-pay

## 2019-02-15 ENCOUNTER — Other Ambulatory Visit: Payer: Self-pay

## 2019-02-15 ENCOUNTER — Inpatient Hospital Stay (HOSPITAL_COMMUNITY)
Admission: RE | Admit: 2019-02-15 | Discharge: 2019-03-12 | DRG: 560 | Disposition: A | Discharge status: Skilled Nursing Facility | Payer: Medicare Other | Source: Intra-hospital | Attending: Physical Medicine & Rehabilitation | Admitting: Physical Medicine & Rehabilitation

## 2019-02-15 DIAGNOSIS — N390 Urinary tract infection, site not specified: Secondary | ICD-10-CM | POA: Diagnosis not present

## 2019-02-15 DIAGNOSIS — I10 Essential (primary) hypertension: Secondary | ICD-10-CM | POA: Diagnosis present

## 2019-02-15 DIAGNOSIS — S14129D Central cord syndrome at unspecified level of cervical spinal cord, subsequent encounter: Secondary | ICD-10-CM | POA: Diagnosis not present

## 2019-02-15 DIAGNOSIS — G629 Polyneuropathy, unspecified: Secondary | ICD-10-CM | POA: Diagnosis present

## 2019-02-15 DIAGNOSIS — I951 Orthostatic hypotension: Secondary | ICD-10-CM

## 2019-02-15 DIAGNOSIS — Z79899 Other long term (current) drug therapy: Secondary | ICD-10-CM

## 2019-02-15 DIAGNOSIS — R509 Fever, unspecified: Secondary | ICD-10-CM

## 2019-02-15 DIAGNOSIS — D62 Acute posthemorrhagic anemia: Secondary | ICD-10-CM

## 2019-02-15 DIAGNOSIS — M7989 Other specified soft tissue disorders: Secondary | ICD-10-CM | POA: Diagnosis not present

## 2019-02-15 DIAGNOSIS — Z8679 Personal history of other diseases of the circulatory system: Secondary | ICD-10-CM

## 2019-02-15 DIAGNOSIS — Z4789 Encounter for other orthopedic aftercare: Principal | ICD-10-CM

## 2019-02-15 DIAGNOSIS — M810 Age-related osteoporosis without current pathological fracture: Secondary | ICD-10-CM | POA: Diagnosis present

## 2019-02-15 DIAGNOSIS — M19042 Primary osteoarthritis, left hand: Secondary | ICD-10-CM | POA: Diagnosis not present

## 2019-02-15 DIAGNOSIS — Z1159 Encounter for screening for other viral diseases: Secondary | ICD-10-CM | POA: Diagnosis not present

## 2019-02-15 DIAGNOSIS — F419 Anxiety disorder, unspecified: Secondary | ICD-10-CM | POA: Diagnosis present

## 2019-02-15 DIAGNOSIS — K592 Neurogenic bowel, not elsewhere classified: Secondary | ICD-10-CM

## 2019-02-15 DIAGNOSIS — I471 Supraventricular tachycardia: Secondary | ICD-10-CM | POA: Diagnosis present

## 2019-02-15 DIAGNOSIS — S14129S Central cord syndrome at unspecified level of cervical spinal cord, sequela: Secondary | ICD-10-CM | POA: Diagnosis not present

## 2019-02-15 DIAGNOSIS — K5900 Constipation, unspecified: Secondary | ICD-10-CM | POA: Diagnosis present

## 2019-02-15 DIAGNOSIS — M19041 Primary osteoarthritis, right hand: Secondary | ICD-10-CM | POA: Diagnosis not present

## 2019-02-15 DIAGNOSIS — N319 Neuromuscular dysfunction of bladder, unspecified: Secondary | ICD-10-CM | POA: Diagnosis present

## 2019-02-15 DIAGNOSIS — S14129A Central cord syndrome at unspecified level of cervical spinal cord, initial encounter: Secondary | ICD-10-CM | POA: Diagnosis present

## 2019-02-15 MED ORDER — FLEET ENEMA 7-19 GM/118ML RE ENEM
1.0000 | ENEMA | Freq: Once | RECTAL | Status: AC | PRN
  Administered 2019-02-17: 1 via RECTAL
  Filled 2019-02-15: qty 1

## 2019-02-15 MED ORDER — CALCIUM CARBONATE-VITAMIN D 500-200 MG-UNIT PO TABS
1.0000 | ORAL_TABLET | Freq: Two times a day (BID) | ORAL | Status: DC
  Administered 2019-02-15 – 2019-03-12 (×50): 1 via ORAL
  Filled 2019-02-15 (×51): qty 1

## 2019-02-15 MED ORDER — BISACODYL 10 MG RE SUPP: 10.0000 mg | Freq: Every day | RECTAL | Status: DC | PRN

## 2019-02-15 MED ORDER — PROCHLORPERAZINE EDISYLATE 10 MG/2ML IJ SOLN: 5.0000 mg | Freq: Four times a day (QID) | INTRAMUSCULAR | Status: DC | PRN

## 2019-02-15 MED ORDER — HYDROCODONE-ACETAMINOPHEN 5-325 MG PO TABS: 1.0000 | ORAL_TABLET | ORAL | 0 refills | Status: DC | PRN

## 2019-02-15 MED ORDER — OXYCODONE HCL 5 MG PO TABS
10.0000 mg | ORAL_TABLET | Freq: Two times a day (BID) | ORAL | Status: DC
  Administered 2019-02-16 – 2019-03-12 (×45): 10 mg via ORAL
  Filled 2019-02-15 (×47): qty 2

## 2019-02-15 MED ORDER — POLYETHYLENE GLYCOL 3350 17 G PO PACK
17.0000 g | PACK | Freq: Every day | ORAL | Status: DC | PRN
  Administered 2019-02-16 – 2019-03-11 (×4): 17 g via ORAL
  Filled 2019-02-15 (×4): qty 1

## 2019-02-15 MED ORDER — BISACODYL 10 MG RE SUPP: 10.0000 mg | Freq: Every day | RECTAL | Status: DC

## 2019-02-15 MED ORDER — PROCHLORPERAZINE MALEATE 5 MG PO TABS: 5.0000 mg | ORAL_TABLET | Freq: Four times a day (QID) | ORAL | Status: DC | PRN

## 2019-02-15 MED ORDER — GABAPENTIN 300 MG PO CAPS: 300.0000 mg | ORAL_CAPSULE | Freq: Three times a day (TID) | ORAL | Status: DC

## 2019-02-15 MED ORDER — DIPHENHYDRAMINE HCL 12.5 MG/5ML PO ELIX
12.5000 mg | ORAL_SOLUTION | Freq: Four times a day (QID) | ORAL | Status: DC | PRN
  Administered 2019-03-08 – 2019-03-11 (×4): 25 mg via ORAL
  Filled 2019-02-15 (×4): qty 10

## 2019-02-15 MED ORDER — ROSUVASTATIN CALCIUM 5 MG PO TABS
5.0000 mg | ORAL_TABLET | Freq: Every day | ORAL | Status: DC
  Administered 2019-02-15 – 2019-03-11 (×25): 5 mg via ORAL
  Filled 2019-02-15 (×25): qty 1

## 2019-02-15 MED ORDER — BISACODYL 10 MG RE SUPP
10.0000 mg | Freq: Every day | RECTAL | Status: DC
  Administered 2019-02-16 – 2019-03-12 (×22): 10 mg via RECTAL
  Filled 2019-02-15 (×25): qty 1

## 2019-02-15 MED ORDER — DOCUSATE SODIUM 100 MG PO CAPS: 100.0000 mg | ORAL_CAPSULE | Freq: Two times a day (BID) | ORAL | 0 refills | Status: DC

## 2019-02-15 MED ORDER — TRAZODONE HCL 50 MG PO TABS
25.0000 mg | ORAL_TABLET | Freq: Every evening | ORAL | Status: DC | PRN
  Administered 2019-02-17 (×2): 25 mg via ORAL
  Administered 2019-02-18 – 2019-03-04 (×9): 50 mg via ORAL
  Filled 2019-02-15 (×13): qty 1

## 2019-02-15 MED ORDER — SENNOSIDES-DOCUSATE SODIUM 8.6-50 MG PO TABS
2.0000 | ORAL_TABLET | Freq: Every day | ORAL | Status: DC
  Administered 2019-02-15 – 2019-03-01 (×15): 2 via ORAL
  Filled 2019-02-15 (×15): qty 2

## 2019-02-15 MED ORDER — ACETAMINOPHEN 325 MG PO TABS
325.0000 mg | ORAL_TABLET | ORAL | Status: DC | PRN
  Administered 2019-02-16 – 2019-03-05 (×6): 650 mg via ORAL
  Filled 2019-02-15 (×7): qty 2

## 2019-02-15 MED ORDER — PROCHLORPERAZINE 25 MG RE SUPP: 12.5000 mg | Freq: Four times a day (QID) | RECTAL | Status: DC | PRN

## 2019-02-15 MED ORDER — GUAIFENESIN-DM 100-10 MG/5ML PO SYRP: 5.0000 mL | ORAL_SOLUTION | Freq: Four times a day (QID) | ORAL | Status: DC | PRN

## 2019-02-15 MED ORDER — TRAMADOL HCL 50 MG PO TABS
50.0000 mg | ORAL_TABLET | Freq: Four times a day (QID) | ORAL | Status: DC | PRN
  Administered 2019-02-15 – 2019-03-08 (×5): 50 mg via ORAL
  Filled 2019-02-15 (×5): qty 1

## 2019-02-15 MED ORDER — HYDROCODONE-ACETAMINOPHEN 5-325 MG PO TABS
1.0000 | ORAL_TABLET | ORAL | Status: DC | PRN
  Administered 2019-02-19 – 2019-03-10 (×7): 1 via ORAL
  Filled 2019-02-15 (×7): qty 1

## 2019-02-15 MED ORDER — PANTOPRAZOLE SODIUM 40 MG PO TBEC
40.0000 mg | DELAYED_RELEASE_TABLET | Freq: Two times a day (BID) | ORAL | Status: DC
  Administered 2019-02-15 – 2019-03-12 (×50): 40 mg via ORAL
  Filled 2019-02-15 (×51): qty 1

## 2019-02-15 MED ORDER — METOPROLOL SUCCINATE ER 25 MG PO TB24
12.5000 mg | ORAL_TABLET | Freq: Every day | ORAL | Status: DC
  Administered 2019-02-16 – 2019-03-12 (×21): 12.5 mg via ORAL
  Filled 2019-02-15 (×24): qty 1

## 2019-02-15 MED ORDER — ALUM & MAG HYDROXIDE-SIMETH 200-200-20 MG/5ML PO SUSP: 30.0000 mL | ORAL | Status: DC | PRN

## 2019-02-15 MED ORDER — GABAPENTIN 300 MG PO CAPS
300.0000 mg | ORAL_CAPSULE | Freq: Three times a day (TID) | ORAL | Status: DC
  Administered 2019-02-15 – 2019-03-12 (×74): 300 mg via ORAL
  Filled 2019-02-15 (×74): qty 1

## 2019-02-15 MED ORDER — OXYCODONE HCL 5 MG PO TABS
5.0000 mg | ORAL_TABLET | ORAL | Status: DC | PRN
  Administered 2019-02-22 – 2019-02-26 (×3): 10 mg via ORAL
  Administered 2019-03-06: 5 mg via ORAL
  Administered 2019-03-08 – 2019-03-09 (×2): 10 mg via ORAL
  Filled 2019-02-15 (×6): qty 2

## 2019-02-15 NOTE — Progress Notes (Addendum)
Inpatient Rehabilitation-Admissions Coordinator   Able to offer this pt a bed in CIR today. Awaiting medical clearance from Neurosurgery (+DC order).   Please call if questions.   Nanine Means, OTR/L  Rehab Admissions Coordinator  [phone] 02/15/2019 8:55 AM  Addendum 10:04AM: Noticed DC order placed. Confirmed via phone that pt is still in agreement for CIR. RN notified of plans to admit to CIR today.   Please call if questions.   Nanine Means, OTR/L  Rehab Admissions Coordinator  [phone] 02/15/2019 10:05 AM

## 2019-02-15 NOTE — Progress Notes (Signed)
Patient is transferring to 4W. Nurse has called report. All belongings sent with patient. Denise Espinoza

## 2019-02-15 NOTE — H&P (Signed)
Physical Medicine and Rehabilitation Admission H&P        Chief Complaint  Patient presents with  . Functional deficits due to cervical myelopathy post fall.       HPI: Denise Espinoza is an 85 year old female with history of HTN, anosmia, SVT, few months history of BLE instability  who was admitted via [facility] on 02/04/19 with reports of fall 3 weeks PTA with onset of RUE>LUE numbness and weakness.  She was found to have displaced odontoid fracture with severe cervical spinal stenosis C1/C2 and C3/C4. She underwent C1 laminectomy for decompression fo spinal cord with ORIF C2 fracture and PLF occiput to C4 by Dr. Conchita Paris on 02/06/19.  She was making good progress but severe neck pain 5/2 with  BLE instability. Follow up CT showed C2 laminar screw intruding into posterior spinal canal and right lateral recess. She was taken to OR for removal of C2 translaminar screw on 5/5 but continued to have decline in BUE strength and worsening of numbness.    Follow up MRI spine 5/6 showed removal of C2 screw with continue anterolisthesis of C1 on nonunited dens fragment with continued canal stenosis and flattening of cord with abnormal T2 cord signal and surrounding edema. Symptoms felt to be due to edema and she was started on medrol dose pack. Therapy ongoing but patient continues to be limited by cervical myelopathy with weakness BUE/BLE, decreased initiation, anxiety and pain affecting ADLs and mobility.  CIR recommended due to functional decline. .      Review of Systems  Constitutional: Negative for chills and fever.  HENT: Negative for hearing loss and tinnitus.   Eyes: Negative for blurred vision and double vision.  Respiratory: Negative for cough and shortness of breath.   Cardiovascular: Negative for chest pain and palpitations.  Gastrointestinal: Positive for constipation (hasn't had BM since admission). Negative for heartburn and nausea.  Genitourinary: Positive for frequency.   Incontinent of bladder for years--wears pads with depends. Rectal pain/spasms.  Musculoskeletal: Negative for joint pain and myalgias.  Skin: Negative for rash.  Neurological: Positive for sensory change and weakness. Negative for dizziness and headaches.  Psychiatric/Behavioral: Negative for memory loss.          Past Medical History:  Diagnosis Date  . Allergic rhinitis    . Anosmia    . Arthritis    . Full dentures    . History of diverticulitis    . Insomnia    . Insomnia    . Senile osteoporosis    . SOB (shortness of breath)    . SVT (supraventricular tachycardia) (HCC)    . Varicose vein of leg      left  . Wears glasses             Past Surgical History:  Procedure Laterality Date  . COLON RESECTION   1989    diverticulitis  . COLONOSCOPY      . ESOPHAGEAL DILATION   2015    endoscopy  . POSTERIOR CERVICAL FUSION/FORAMINOTOMY N/A 02/11/2019    Procedure: POSTERIOR CERVICAL INSTRUMENTATION REMOVAL CERVICAL TWO;  Surgeon: Lisbeth Renshaw, MD;  Location: [facility] OR;  Service: Neurosurgery;  Laterality: N/A;  . POSTERIOR CERVICAL LAMINECTOMY N/A 02/06/2019    Procedure: Cervical one LAMINECTOMY, Occiput-Cervical four fusion;  Surgeon: Lisbeth Renshaw, MD;  Location: [facility] OR;  Service: Neurosurgery;  Laterality: N/A;      Family History  Problem Relation Age of Onset  . Cancer Mother    .  High blood pressure Father    . Emphysema Brother        Social History: Lives with family. She reports that she has never smoked. She has never used smokeless tobacco. She reports current alcohol on rate occasions.  She reports that she does not use drugs.           Allergies  Allergen Reactions  . Penicillins Hives, Swelling, Rash and Other (See Comments)      Did it involve swelling of the face/tongue/throat, SOB, or low BP? #  #  #  YES  #  #  #  Did it involve sudden or severe rash/hives, skin peeling, or any reaction on the inside of your mouth or nose? No Did you need to  seek medical attention at a hospital or doctor's office? #  #  #  YES  #  #  #  When did it last happen?    85 years old.    . Codeine Phosphate [Codeine] Other (See Comments)      headache            Medications Prior to Admission  Medication Sig Dispense Refill  . calcium-vitamin D (OSCAL WITH D) 500-200 MG-UNIT per tablet Take 1 tablet by mouth 2 (two) times daily.       Marland Kitchen. denosumab (PROLIA) 60 MG/ML SOLN injection Inject 60 mg into the skin every 6 (six) months. Administer in upper arm, thigh, or abdomen      . metoprolol succinate (TOPROL-XL) 25 MG 24 hr tablet Take 12.5 mg by mouth daily.      . rosuvastatin (CRESTOR) 5 MG tablet Take 5 mg by mouth daily.      . traMADol (ULTRAM) 50 MG tablet Take 50 mg by mouth every 6 (six) hours as needed for moderate pain.       . traZODone (DESYREL) 50 MG tablet Take 50 mg by mouth at bedtime as needed for sleep.           Drug Regimen Review  Drug regimen was reviewed and remains appropriate with no significant issues identified   Home: Home Living Family/patient expects to be discharged to:: Private residence Living Arrangements: Children, Other relatives Available Help at Discharge: Family, Available 24 hours/day Type of Home: House Home Access: Level entry Home Layout: Other (Comment)(two steps (with rail) to dining room/"sitting room") Bathroom Shower/Tub: Walk-in shower Home Equipment: Information systems managerhower seat, Medical laboratory scientific officerCane - single point, Environmental consultantWalker - 2 wheels Additional Comments: has son and daughter to (A)   Functional History: Prior Function Level of Independence: Independent Comments: drives, enjoys walking   Functional Status:  Mobility: Bed Mobility Overal bed mobility: Needs Assistance Bed Mobility: Rolling, Sidelying to Sit, Sit to Sidelying Rolling: Mod assist Sidelying to sit: Mod assist, +2 for safety/equipment Supine to sit: Mod assist Sit to supine: Mod assist Sit to sidelying: Max assist, +2 for physical assistance General bed  mobility comments: cues for sequencing; pt is able to bring bilat LE to EOB; assist to elevate trunk into sitting and scoot hips with bed pad Transfers Overall transfer level: Needs assistance Equipment used: 2 person hand held assist Transfers: Sit to/from Stand Sit to Stand: +2 physical assistance, Max assist, Mod assist Stand pivot transfers: Max assist, Total assist, +2 physical assistance General transfer comment: bilat LE blocked for safety; assist with use of gait belt and bed pad to power up into standing and facitliation at glutes for hip extension; pt with flexed posture  Ambulation/Gait Ambulation/Gait assistance:  Mod assist Gait Distance (Feet): 2 Feet Assistive device: Rolling walker (2 wheeled) Gait Pattern/deviations: Step-to pattern(side steps) General Gait Details: unable to progress to gait training this session Gait velocity: decreased   ADL: ADL Overall ADL's : Needs assistance/impaired Eating/Feeding: Total assistance, Bed level Eating/Feeding Details (indicate cue type and reason): Pt now unable to bring utensil to her mouth today.  She demonstrates significant difficulty targeting her mouth even using curved utensil  Grooming: Wash/dry hands, Wash/dry face, Oral care, Applying deodorant, Maximal assistance, Bed level Upper Body Bathing: Moderate assistance Lower Body Bathing: Total assistance, +2 for physical assistance, Sit to/from stand Upper Body Dressing : Maximal assistance, Bed level Lower Body Dressing: Total assistance, Bed level Toilet Transfer: Moderate assistance, +2 for physical assistance, +2 for safety/equipment Toilet Transfer Details (indicate cue type and reason): simulated during functional mobility (sit/stand bedside) Toileting- Clothing Manipulation and Hygiene: Total assistance, +2 for physical assistance, Sit to/from stand Toileting - Clothing Manipulation Details (indicate cue type and reason): simulated during functional mobility (sit/stand  bedside) Functional mobility during ADLs: Minimal assistance, Rolling walker General ADL Comments: pt limited by decreased functional use of B UES, impaired balance and decreased activity tolerance   Cognition: Cognition Overall Cognitive Status: Within Functional Limits for tasks assessed Orientation Level: Oriented X4 Cognition Arousal/Alertness: Awake/alert Behavior During Therapy: WFL for tasks assessed/performed Overall Cognitive Status: Within Functional Limits for tasks assessed Area of Impairment: Problem solving Safety/Judgement: Decreased awareness of deficits Problem Solving: Difficulty sequencing, Requires tactile cues, Requires verbal cues, Slow processing, Decreased initiation General Comments: WFL for simple tasks during session     Blood pressure 129/68, pulse 89, temperature 98.9 F (37.2 C), temperature source Oral, resp. rate 17, height 5\' 3"  (1.6 m), weight 65.8 kg, SpO2 97 %. Physical Exam  Nursing note and vitals reviewed. Constitutional: She is oriented to person, place, and time. She appears well-developed and well-nourished.  HENT:  Head: Normocephalic.  Eyes: Pupils are equal, round, and reactive to light. EOM are normal.  Neck:  Immobilized by collar  Cardiovascular: Normal rate and regular rhythm. Exam reveals no friction rub.  No murmur heard. Respiratory: Effort normal. No stridor. No respiratory distress. She has no wheezes. She has no rales.  GI: Soft. She exhibits no distension. There is no abdominal tenderness. There is no rebound.  Musculoskeletal:        General: No tenderness or edema.  Neurological: She is alert and oriented to person, place, and time. No cranial nerve deficit.  Good insight and awareness. Normal language, memory. RUE 1+ prox to distal. LUE 2 to 2+/5 prox to distal. RLE 3/5 prox to distal. LLE 3-4/5 prox to distal. Decreased LT and pain although senses gross touch and pain below shoulders bilaterally. DTR's 2+ UE and 3+ LE's.    Skin: Skin is warm and dry.      Lab Results Last 48 Hours  No results found for this or any previous visit (from the past 48 hour(s)).   Imaging Results (Last 48 hours)  No results found.           Medical Problem List and Plan: 1.  Functional and mobility deficits secondary to C2 fracture and 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 Pt is s/p C1 laminectomy, C2 ORIF and Occiput to C4 PLIF             -admit to inpatient rehab 2.  Antithrombotics: -DVT/anticoagulation:  Mechanical: Sequential compression devices, below knee Bilateral lower extremitiesCheck dopplers.              -  antiplatelet therapy: N/A 3. Pain Management: Oxycodone prn--premedicate prior to therapy. Gabapentin tid for neuropathy 4. Mood: Team to provide ego support. LCSW to follow for evaluation and support.              -antipsychotic agents: N/A 5. Neuropsych: This patient is capable of making decisions on her own behalf. 6. Skin/Wound Care: Will order air matress overlay for pressure relief.  7. Fluids/Electrolytes/Nutrition: Needs assistance with meals. Monitor I/O. Check lytes Monday 8. H/o SVT: Continue metoprolol daily 9.  Neurogenic bowel: Start bowel regime with daily suppositories.  10.  Neurogenic bladder: Has been incontinent for years. Will monitor PVRs- start bladder program.       Post Admission Physician Evaluation: 1. Functional deficits secondary  to cervical myelopathy, central cord syndrome. 2. Patient is admitted to receive collaborative, interdisciplinary care between the physiatrist, rehab nursing staff, and therapy team. 3. Patient's level of medical complexity and substantial therapy needs in context of that medical necessity cannot be provided at a lesser intensity of care such as a SNF. 4. Patient has experienced substantial functional loss from his/her baseline which was documented above under the "Functional History" and "Functional Status" headings.  Judging by the patient's  diagnosis, physical exam, and functional history, the patient has potential for functional progress which will result in measurable gains while on inpatient rehab.  These gains will be of substantial and practical use upon discharge  in facilitating mobility and self-care at the household level. 5. Physiatrist will provide 24 hour management of medical needs as well as oversight of the therapy plan/treatment and provide guidance as appropriate regarding the interaction of the two. 6. The Preadmission Screening has been reviewed and patient status is unchanged unless otherwise stated above. 7. 24 hour rehab nursing will assist with bladder management, bowel management, safety, skin/wound care, disease management, medication administration, pain management and patient education  and help integrate therapy concepts, techniques,education, etc. 8. PT will assess and treat for/with: Lower extremity strength, range of motion, stamina, balance, functional mobility, safety, adaptive techniques and equipment, NMR.   Goals are: mod to max assist. 9. OT will assess and treat for/with: ADL's, functional mobility, safety, upper extremity strength, adaptive techniques and equipment, NMR.   Goals are: mod to max assist. Therapy may proceed with showering this patient. 10. SLP will assess and treat for/with: n/a.  Goals are: n/a. 11. Case Management and Social Worker will assess and treat for psychological issues and discharge planning. 12. Team conference will be held weekly to assess progress toward goals and to determine barriers to discharge. 13. Patient will receive at least 3 hours of therapy per day at least 5 days per week. 14. ELOS: 16-20 days       15. Prognosis:  excellent   I have personally performed a face to face diagnostic evaluation of this patient and formulated the key components of the plan.  Additionally, I have personally reviewed laboratory data, imaging studies, as well as relevant notes and  concur with the physician assistant's documentation above.  Ranelle Oyster, MD, [place] [facility]        Jacquelynn Cree, PA-C 02/14/2019

## 2019-02-15 NOTE — Discharge Instructions (Signed)
Discharge Instructions  Slowly increase your activity back to normal, continue wearing your cervical collar except when bathing.   Okay to shower on the day of discharge. Use regular soap and water and try to be gentle when cleaning your incision. You do not have to put a dressing on your incision. If the cervical collar rubs or bothers your incision, you can put a piece of gauze over it to prevent the collar from rubbing on it.   Follow up with Dr. Conchita Paris in 2 weeks after discharge. If you do not already have a discharge appointment, please call his office at [phone] to schedule a follow up appointment. If you have any concerns or questions, please call the office and let us know.

## 2019-02-15 NOTE — Progress Notes (Signed)
Neurosurgery Service Progress Note  Subjective: No acute events overnight, no new complaints this morning   Objective: Vitals:   02/14/19 2019 02/14/19 2358 02/15/19 0354 02/15/19 0746  BP: (!) 90/49 126/65 121/65 130/66  Pulse: 94 91 80 82  Resp: 18 18 16 20   Temp: 99.6 F (37.6 C) 98.5 F (36.9 C) 98 F (36.7 C) 98.4 F (36.9 C)  TempSrc: Oral Oral Oral Oral  SpO2: 96% 97% 96% 98%  Weight:      Height:       Temp (24hrs), Avg:98.7 F (37.1 C), Min:98 F (36.7 C), Max:99.6 F (37.6 C)  CBC Latest Ref Rng & Units 02/12/2019 02/07/2019 02/04/2019  WBC 4.0 - 10.5 K/uL 7.4 10.9(H) 5.9  Hemoglobin 12.0 - 15.0 g/dL 11.1(L) 11.6(L) 12.3  Hematocrit 36.0 - 46.0 % 33.6(L) 34.8(L) 38.2  Platelets 150 - 400 K/uL 200 172 196   BMP Latest Ref Rng & Units 02/12/2019 02/07/2019 02/04/2019  Glucose 70 - 99 mg/dL 213(Y) 865(H) 98  BUN 8 - 23 mg/dL 12 17 20   Creatinine 0.44 - 1.00 mg/dL 8.46 9.62 9.52  Sodium 135 - 145 mmol/L 138 135 139  Potassium 3.5 - 5.1 mmol/L 3.9 4.3 4.3  Chloride 98 - 111 mmol/L 98 101 102  CO2 22 - 32 mmol/L 27 24 28   Calcium 8.9 - 10.3 mg/dL 8.9 8.4(X) 9.0    Intake/Output Summary (Last 24 hours) at 02/15/2019 0951 Last data filed at 02/15/2019 3244 Gross per 24 hour  Intake -  Output 800 ml  Net -800 ml    Current Facility-Administered Medications:  .  0.9 %  sodium chloride infusion, 250 mL, Intravenous, PRN, Costella, Vincent J, PA-C .  0.9 %  sodium chloride infusion, , Intravenous, Continuous, Costella, Darci Current, PA-C, Last Rate: 75 mL/hr at 02/14/19 2358 .  0.9 %  sodium chloride infusion, , Intravenous, Continuous, Costella, Vincent J, PA-C .  acetaminophen (TYLENOL) tablet 650 mg, 650 mg, Oral, Q4H PRN, 650 mg at 02/08/19 2144 **OR** acetaminophen (TYLENOL) suppository 650 mg, 650 mg, Rectal, Q4H PRN, Costella, Vincent J, PA-C .  bisacodyl (DULCOLAX) suppository 10 mg, 10 mg, Rectal, Daily PRN, Costella, Vincent J, PA-C, 10 mg at 02/13/19 0918 .   calcium-vitamin D (OSCAL WITH D) 500-200 MG-UNIT per tablet 1 tablet, 1 tablet, Oral, BID, Costella, Darci Current, PA-C, 1 tablet at 02/14/19 2105 .  docusate sodium (COLACE) capsule 100 mg, 100 mg, Oral, BID, Costella, Vincent J, PA-C, 100 mg at 02/14/19 2105 .  gabapentin (NEURONTIN) capsule 300 mg, 300 mg, Oral, TID, Costella, Vincent J, PA-C, 300 mg at 02/14/19 2105 .  HYDROcodone-acetaminophen (NORCO/VICODIN) 5-325 MG per tablet 1 tablet, 1 tablet, Oral, Q4H PRN, Costella, Darci Current, PA-C, 1 tablet at 02/14/19 1721 .  HYDROmorphone (DILAUDID) injection 0.5-1 mg, 0.5-1 mg, Intravenous, Q2H PRN, Costella, Vincent J, PA-C, 0.5 mg at 02/08/19 1150 .  lactated ringers infusion, , Intravenous, Continuous, Ellender, Catheryn Bacon, MD, Last Rate: 10 mL/hr at 02/11/19 0957 .  lip balm (CARMEX) ointment, , Topical, PRN, Lisbeth Renshaw, MD .  menthol-cetylpyridinium (CEPACOL) lozenge 3 mg, 1 lozenge, Oral, PRN **OR** phenol (CHLORASEPTIC) mouth spray 1 spray, 1 spray, Mouth/Throat, PRN, Costella, Vincent J, PA-C .  methocarbamol (ROBAXIN) tablet 500 mg, 500 mg, Oral, Q6H PRN, 500 mg at 02/10/19 2327 **OR** methocarbamol (ROBAXIN) 500 mg in dextrose 5 % 50 mL IVPB, 500 mg, Intravenous, Q6H PRN, Costella, Vincent J, PA-C .  metoprolol succinate (TOPROL-XL) 24 hr tablet 12.5 mg, 12.5 mg,  Oral, Daily, Costella, Vincent J, PA-C, 12.5 mg at 02/14/19 0959 .  ondansetron (ZOFRAN) tablet 4 mg, 4 mg, Oral, Q6H PRN **OR** ondansetron (ZOFRAN) injection 4 mg, 4 mg, Intravenous, Q6H PRN, Costella, Vincent J, PA-C, 4 mg at 02/11/19 1627 .  oxyCODONE (Oxy IR/ROXICODONE) immediate release tablet 5-10 mg, 5-10 mg, Oral, Q3H PRN, Costella, Vincent J, PA-C, 10 mg at 02/15/19 1224 .  pantoprazole (PROTONIX) EC tablet 40 mg, 40 mg, Oral, BID, Donalee Citrin, MD, 40 mg at 02/14/19 2105 .  rosuvastatin (CRESTOR) tablet 5 mg, 5 mg, Oral, q1800, Costella, Vincent J, PA-C, 5 mg at 02/14/19 1721 .  senna (SENOKOT) tablet 8.6 mg, 1 tablet, Oral,  BID, Costella, Vincent J, PA-C, 8.6 mg at 02/14/19 2105 .  senna-docusate (Senokot-S) tablet 1 tablet, 1 tablet, Oral, QHS PRN, Costella, Vincent J, PA-C .  sodium chloride flush (NS) 0.9 % injection 3 mL, 3 mL, Intravenous, Q12H, Costella, Vincent J, PA-C, 3 mL at 02/14/19 2106 .  sodium chloride flush (NS) 0.9 % injection 3 mL, 3 mL, Intravenous, PRN, Costella, Vincent J, PA-C, 3 mL at 02/10/19 2226 .  sodium phosphate (FLEET) 7-19 GM/118ML enema 1 enema, 1 enema, Rectal, Once PRN, Costella, Darci Current, PA-C .  traMADol (ULTRAM) tablet 50 mg, 50 mg, Oral, Q6H PRN, Costella, Darci Current, PA-C, 50 mg at 02/09/19 2127 .  traZODone (DESYREL) tablet 50 mg, 50 mg, Oral, QHS PRN, Costella, Vincent J, PA-C, 50 mg at 02/15/19 0008 .  zolpidem (AMBIEN) tablet 5 mg, 5 mg, Oral, QHS PRN, Costella, Darci Current, PA-C, 5 mg at 02/13/19 2147   Physical Exam: AOx3, PERRL, EOMI, FS, increased tone in RUE > LUE with diffusely 1-2/5 in RUE and LUE 4-/5 distal and 3/5 proximal  Assessment & Plan: 85 y.o. woman with complex C2 deformity s/p reduction and instrumented fusion, recovering well.  -discharge to CIR today  Denise Espinoza  02/15/19 9:51 AM

## 2019-02-15 NOTE — Discharge Summary (Signed)
Discharge Summary  Date of Admission: 02/04/2019  Date of Discharge: 02/15/19  Attending Physician: Lisbeth Renshaw, MD  Hospital Course: Patient was admitted for a displaced type 2 dens fracture that required operative reduction and fixation on 02/06/19 with occiput to C4 posterior instrumented fusion. Post-operatively, she had some radicular pain so one of the C2 laminar screws was removed in the OR on 02/11/19. She was recovered in PACU and transferred to a RNF. Her hospital course was uncomplicated and the patient was discharged to CIR on 02/15/2019. She will follow up in clinic with Dr. Conchita Paris in 2 weeks.  Neurologic exam at discharge:  AOx3, PERRL, EOMI, FS, increased tone in RUE > LUE with diffusely 1-2/5 in RUE and LUE 4-/5 distal and 3/5 proximally   Discharge diagnosis: Displaced type 2 dens fracture, cervical myelopathy  Jadene Pierini, MD 02/15/19 9:56 AM

## 2019-02-15 NOTE — Progress Notes (Signed)
Denise Staggers, MD  Physician  Physical Medicine and Rehabilitation  PMR Pre-admission  Signed  Date of Service:  02/14/2019 12:29 PM       Related encounter: ED to Hosp-Admission (Current) from 02/04/2019 in [facility]      Signed         PMR Admission Coordinator Pre-Admission Assessment  Patient: Denise Espinoza is an 85 y.o., female MRN: [id number] DOB: 08-25-1930 Height: '5\' 3"'$  (160 cm) Weight: 65.8 kg  Insurance Information HMO:     PPO:      PCP:      IPA:      80/20: Yes     OTHER:  PRIMARY: Medicare Part A and B      Policy#: [id number]      Subscriber: Patient CM Name:       Phone#:      Fax#:  Pre-Cert#:       Employer:  Benefits:  Phone #: NA     Name: verified eligibility via [facility] on 02/13/19 Eff. Date: part A 01/07/97; part B 03/10/1999     Deduct: $1,408.00      Out of Pocket Max: NA      Life Max: NA CIR: Covered per Medicare guidelines once yearly deductible is met      SNF: days 1-20, 100%, days 21-100, 80% Outpatient: 80%     Co-Pay: 20% Home Health: 100%      Co-Pay:  DME: 80%     Co-Pay: 20% Providers: Pt's choice [employer]      Policy#: [id number]      Subscriber: Patient CM Name:       Phone#:      Fax#:  Pre-Cert#:       Employer:  Benefits:  Phone #: [phone]     Name:  Eff. Date:      Deduct:       Out of Pocket Max:       Life Max: CIR:       SNF:  Outpatient:      Co-Pay:  Home Health:       Co-Pay:  DME:      Co-Pay:   Medicaid Application Date:       Case Manager:  Disability Application Date:       Case Worker:   The "Data Collection Information Summary" for patients in Inpatient Rehabilitation Facilities with attached "Privacy Act [place] Records" was provided and verbally reviewed with: Patient  Emergency Contact Information         Contact Information    Name Relation Home Work Mobile   Jordan,Bobby Niece   [phone]   blackstock,alexandria Granddaughter    [phone]   Blackstock,Kim Daughter [phone]  [phone]   Freddy Jaksch  [phone]        Current Medical History  Patient Admitting Diagnosis: cervical myelopathy s/p posterior cervical decompression and fusion with subsequent repeat surgery for removal of C2 screw.  History of Present Illness: Denise Espinoza is an 85 year old female with history of HTN, anosmia, SVT, few months history of BLE instability who was admitted via [facility] on 02/04/19 with reports of fall 3 weeks PTA with onset of RUE>LUE numbness and weakness. She was found to have displaced odontoid fracture with severe cervical spinal stenosis C1/C2 and C3/C4. She underwent C1 laminectomy for decompression fo spinal cord with ORIF C2 fracture and PLF occiput to C4 by Dr. Kathyrn Sheriff on 02/06/19. She was making good progress but severe neck  pain 5/2 with BLE instability. Follow up CT showed C2 laminar screw intruding into posterior spinal canal and right lateral recess. She was taken to OR for removal of C2 translaminar screw on 5/5 but continued to have decline in BUE strength and worsening of numbness.  Follow up MRI spine 5/6 showed removal of C2 screw with continue anterolisthesis of C1 on nonunited dens fragment with continued canal stenosis and flattening of cord with abnormal T2 cord signal and surrounding edema. Symptoms felt to be due to edema and she was started on medrol dose pack. Therapy ongoing but patient continues to be limited by cervical myelopathy with weakness BUE/BLE, cognitive deficits with delayed processing, decreased initiation as well as deficits inproblem solving affecting ADLs and mobility. CIR recommended due to functional decline. Pt is to admit to CIR on 02/15/19 with plan to decrease burden of care in prep for SNF placement.    Patient's medical record from [facility] has been reviewed by the rehabilitation admission coordinator and physician.  Past Medical History       Past Medical History:  Diagnosis Date  . Allergic rhinitis   . Anosmia   . Arthritis   . Full dentures   . History of diverticulitis   . Insomnia   . Insomnia   . Senile osteoporosis   . SOB (shortness of breath)   . SVT (supraventricular tachycardia) ([place])   . Varicose vein of leg    left  . Wears glasses     Family History   family history includes Cancer in her mother; Emphysema in her brother; High blood pressure in her father.  Prior Rehab/Hospitalizations Has the patient had prior rehab or hospitalizations prior to admission? No  Has the patient had major surgery during 100 days prior to admission? Yes             Current Medications  Current Facility-Administered Medications:  .  0.9 %  sodium chloride infusion, 250 mL, Intravenous, PRN, Costella, Vincent J, PA-C .  0.9 %  sodium chloride infusion, , Intravenous, Continuous, Costella, Vista Mink, PA-C, Last Rate: 75 mL/hr at 02/14/19 2358 .  0.9 %  sodium chloride infusion, , Intravenous, Continuous, Costella, Vincent J, PA-C .  acetaminophen (TYLENOL) tablet 650 mg, 650 mg, Oral, Q4H PRN, 650 mg at 02/08/19 2144 **OR** acetaminophen (TYLENOL) suppository 650 mg, 650 mg, Rectal, Q4H PRN, Costella, Vincent J, PA-C .  bisacodyl (DULCOLAX) suppository 10 mg, 10 mg, Rectal, Daily PRN, Costella, Vincent J, PA-C, 10 mg at 02/13/19 0918 .  calcium-vitamin D (OSCAL WITH D) 500-200 MG-UNIT per tablet 1 tablet, 1 tablet, Oral, BID, Costella, Vista Mink, PA-C, 1 tablet at 02/14/19 2105 .  docusate sodium (COLACE) capsule 100 mg, 100 mg, Oral, BID, Costella, Vincent J, PA-C, 100 mg at 02/14/19 2105 .  gabapentin (NEURONTIN) capsule 300 mg, 300 mg, Oral, TID, Costella, Vincent J, PA-C, 300 mg at 02/14/19 2105 .  HYDROcodone-acetaminophen (NORCO/VICODIN) 5-325 MG per tablet 1 tablet, 1 tablet, Oral, Q4H PRN, Costella, Vista Mink, PA-C, 1 tablet at 02/14/19 1721 .  HYDROmorphone (DILAUDID) injection 0.5-1 mg, 0.5-1 mg,  Intravenous, Q2H PRN, Costella, Vincent J, PA-C, 0.5 mg at 02/08/19 1150 .  lactated ringers infusion, , Intravenous, Continuous, Ellender, Karyl Kinnier, MD, Last Rate: 10 mL/hr at 02/11/19 0957 .  lip balm (CARMEX) ointment, , Topical, PRN, Consuella Lose, MD .  menthol-cetylpyridinium (CEPACOL) lozenge 3 mg, 1 lozenge, Oral, PRN **OR** phenol (CHLORASEPTIC) mouth spray 1 spray, 1 spray, Mouth/Throat,  PRN, Costella, Vista Mink, PA-C .  methocarbamol (ROBAXIN) tablet 500 mg, 500 mg, Oral, Q6H PRN, 500 mg at 02/10/19 2327 **OR** methocarbamol (ROBAXIN) 500 mg in dextrose 5 % 50 mL IVPB, 500 mg, Intravenous, Q6H PRN, Costella, Vincent J, PA-C .  metoprolol succinate (TOPROL-XL) 24 hr tablet 12.5 mg, 12.5 mg, Oral, Daily, Costella, Vincent J, PA-C, 12.5 mg at 02/14/19 0959 .  ondansetron (ZOFRAN) tablet 4 mg, 4 mg, Oral, Q6H PRN **OR** ondansetron (ZOFRAN) injection 4 mg, 4 mg, Intravenous, Q6H PRN, Costella, Vincent J, PA-C, 4 mg at 02/11/19 1627 .  oxyCODONE (Oxy IR/ROXICODONE) immediate release tablet 5-10 mg, 5-10 mg, Oral, Q3H PRN, Costella, Vincent J, PA-C, 10 mg at 02/15/19 1941 .  pantoprazole (PROTONIX) EC tablet 40 mg, 40 mg, Oral, BID, Kary Kos, MD, 40 mg at 02/14/19 2105 .  rosuvastatin (CRESTOR) tablet 5 mg, 5 mg, Oral, q1800, Costella, Vincent J, PA-C, 5 mg at 02/14/19 1721 .  senna (SENOKOT) tablet 8.6 mg, 1 tablet, Oral, BID, Costella, Vincent J, PA-C, 8.6 mg at 02/14/19 2105 .  senna-docusate (Senokot-S) tablet 1 tablet, 1 tablet, Oral, QHS PRN, Costella, Vincent J, PA-C .  sodium chloride flush (NS) 0.9 % injection 3 mL, 3 mL, Intravenous, Q12H, Costella, Vincent J, PA-C, 3 mL at 02/14/19 2106 .  sodium chloride flush (NS) 0.9 % injection 3 mL, 3 mL, Intravenous, PRN, Costella, Vincent J, PA-C, 3 mL at 02/10/19 2226 .  sodium phosphate (FLEET) 7-19 GM/118ML enema 1 enema, 1 enema, Rectal, Once PRN, Costella, Vista Mink, PA-C .  traMADol (ULTRAM) tablet 50 mg, 50 mg, Oral, Q6H PRN,  Costella, Vista Mink, PA-C, 50 mg at 02/09/19 2127 .  traZODone (DESYREL) tablet 50 mg, 50 mg, Oral, QHS PRN, Costella, Vincent J, PA-C, 50 mg at 02/15/19 0008 .  zolpidem (AMBIEN) tablet 5 mg, 5 mg, Oral, QHS PRN, Costella, Vincent J, PA-C, 5 mg at 02/13/19 2147  Patients Current Diet:     Diet Order                  Diet regular Room service appropriate? Yes; Fluid consistency: Thin  Diet effective now               Precautions / Restrictions Precautions Precautions: Fall, Cervical Precaution Comments: cervical collar on at all times  Cervical Brace: Hard collar, At all times Restrictions Weight Bearing Restrictions: No   Has the patient had 2 or more falls or a fall with injury in the past year? Yes  Prior Activity Level Community (5-7x/wk): active PTA; drove PTA; Independent PTA (did use RW past 6 weeks since fall)  Prior Functional Level Self Care: Did the patient need help bathing, dressing, using the toilet or eating? Independent  Indoor Mobility: Did the patient need assistance with walking from room to room (with or without device)? Independent  Stairs: Did the patient need assistance with internal or external stairs (with or without device)? Independent  Functional Cognition: Did the patient need help planning regular tasks such as shopping or remembering to take medications? [place] / Equipment Home Assistive Devices/Equipment: Blood pressure cuff, Walker (specify type) Home Equipment: Shower seat, Cane - single point, Walker - 2 wheels  Prior Device Use: Indicate devices/aids used by the patient prior to current illness, exacerbation or injury? Walker ever since fall (approx 6 weeks prior), no AD use prior to fall.   Current Functional Level Cognition  Overall Cognitive Status: Within Functional Limits for tasks assessed  Orientation Level: Oriented X4 Safety/Judgement: Decreased awareness of deficits General  Comments: WFL for simple tasks during session    Extremity Assessment (includes Sensation/Coordination)  Upper Extremity Assessment: RUE deficits/detail, LUE deficits/detail RUE Deficits / Details: Pt demonstrates shoulder flexion 1+/5; elbow 1+/5, wrist 3-/5, hand 3-/5, arthritic deformity  RUE Sensation: decreased light touch, decreased proprioception RUE Coordination: decreased fine motor, decreased gross motor LUE Deficits / Details: Pt demonstrates Lt shoulder 2-/5; elbow 3/5; wrist 3/5, hand 3+/5 LUE Sensation: decreased light touch, decreased proprioception LUE Coordination: decreased fine motor, decreased gross motor  Lower Extremity Assessment: Defer to PT evaluation    ADLs  Overall ADL's : Needs assistance/impaired Eating/Feeding: Total assistance, Bed level Eating/Feeding Details (indicate cue type and reason): Pt now unable to bring utensil to her mouth today.  She demonstrates significant difficulty targeting her mouth even using curved utensil  Grooming: Wash/dry hands, Wash/dry face, Oral care, Applying deodorant, Maximal assistance, Bed level Upper Body Bathing: Moderate assistance Lower Body Bathing: Total assistance, +2 for physical assistance, Sit to/from stand Upper Body Dressing : Maximal assistance, Bed level Lower Body Dressing: Total assistance, Bed level Toilet Transfer: Moderate assistance, +2 for physical assistance, +2 for safety/equipment Toilet Transfer Details (indicate cue type and reason): simulated during functional mobility (sit/stand bedside) Toileting- Clothing Manipulation and Hygiene: Total assistance, +2 for physical assistance, Sit to/from stand Toileting - Clothing Manipulation Details (indicate cue type and reason): simulated during functional mobility (sit/stand bedside) Functional mobility during ADLs: Minimal assistance, Rolling walker General ADL Comments: pt limited by decreased functional use of B UES, impaired balance and decreased  activity tolerance    Mobility  Overal bed mobility: Needs Assistance Bed Mobility: Rolling, Sidelying to Sit, Sit to Sidelying Rolling: Mod assist Sidelying to sit: Mod assist, +2 for safety/equipment Supine to sit: Mod assist Sit to supine: Mod assist Sit to sidelying: Max assist, +2 for physical assistance General bed mobility comments: cues for sequencing; pt is able to bring bilat LE to EOB; assist to elevate trunk into sitting and scoot hips with bed pad    Transfers  Overall transfer level: Needs assistance Equipment used: 2 person hand held assist Transfers: Sit to/from Stand Sit to Stand: +2 physical assistance, Max assist, Mod assist Stand pivot transfers: Max assist, Total assist, +2 physical assistance General transfer comment: bilat LE blocked for safety; assist with use of gait belt and bed pad to power up into standing and facitliation at glutes for hip extension; pt with flexed posture     Ambulation / Gait / Stairs / Wheelchair Mobility  Ambulation/Gait Ambulation/Gait assistance: Mod assist Gait Distance (Feet): 2 Feet Assistive device: Rolling walker (2 wheeled) Gait Pattern/deviations: Step-to pattern(side steps) General Gait Details: unable to progress to gait training this session Gait velocity: decreased    Posture / Balance Dynamic Sitting Balance Sitting balance - Comments: worked on sitting balance EOB with pt able to correct posture but only able to maintain briefly; pt with posterior bias  Balance Overall balance assessment: Needs assistance Sitting-balance support: Feet supported Sitting balance-Leahy Scale: Poor Sitting balance - Comments: worked on sitting balance EOB with pt able to correct posture but only able to maintain briefly; pt with posterior bias  Postural control: Posterior lean Standing balance support: During functional activity, Bilateral upper extremity supported Standing balance-Leahy Scale: Zero Standing balance comment: +2  assist to maintain standing balance; pt is able to increase hip extension in standing with cues but not able to maintain without assistance  Special needs/care consideration BiPAP/CPAP : no CPM : no Continuous Drip IV : 0.9% sodium chloride infusion Dialysis : no        Days : no Life Vest : no Oxygen : no Special Bed : no Trach Size : no Wound Vac (area) : no      Location  :no Skin: ecchymosis on bilateral arms                   Bowel mgmt:last BM: 02/14/19 (constipated prior to this most recent BM-per pt, had to be disimpacted with assistance).   Bladder mgmt: incontinent, use of purwik (incontient prior) Diabetic mgmt: no Behavioral consideration : no Chemo/radiation : no   Previous Home Environment (from acute therapy documentation) Living Arrangements: Children, Other relatives Available Help at Discharge: Family, Available 24 hours/day Type of Home: House Home Layout: Other (Comment)(two steps (with rail) to dining room/"sitting room") Home Access: Level entry Bathroom Shower/Tub: Walk-in shower Home Care Services: No Additional Comments: has son and daughter to (A)  Discharge Living Setting Plans for Discharge Living Setting: Other (Comment)(SNF) Type of Home at Discharge: [place] Name at Discharge: TBD Discharge Home Layout: Other (Comment)(per care facility) Does the patient have any problems obtaining your medications?: No *Pt lives in a 1 story house with family. It is a level entry with 2 steps required once in the house to access the living room and kitchen areas. Pt has a walk in shower and elevated commode. Bathroom is accessible via RW. Anticipated pt to DC to SNF.    Social/Family/Support Systems Patient Roles: Other (Comment)(lived with family PTA) Contact Information: Freddy Jaksch (step daugther) is emergency contact per pt request: [phone]; Maudie Mercury (daughter): [phone] Anticipated Caregiver: SNF Anticipated  Caregiver's Contact Information: see above Ability/Limitations of Caregiver: per SNF Does Caregiver/Family have Issues with Lodging/Transportation while Pt is in Rehab?: No  Goals/Additional Needs Patient/Family Goal for Rehab: PT/OT: Mod/Max A; SLP: NA Expected length of stay: 16-20 days Cultural Considerations: NA  Dietary Needs: regular, thin liquids (pt is a feeder) Equipment Needs: TBD Special Service Needs: SCI team Pt/Family Agrees to Admission and willing to participate: Yes Program Orientation Provided & Reviewed with Pt/Caregiver Including Roles  & Responsibilities: Yes(pt), step daughter Jeannene Patella, and daughter Maudie Mercury   Barriers to Discharge: Decreased caregiver support, Home environment access/layout, Incontinence, Lack of/limited family support  Barriers to Discharge Comments: pt does not have 24/7 reliable support. Home set up is barrier (2 steps to get to kitchen).  Decrease burden of Care through IP rehab admission: Specialzed equipment needs, Decrease number of caregivers, Bowel and bladder program and Patient/family education, self feeding, bed level pressure relief for skin integrity, adaptation for call bell utilization, positioning.   Possible need for SNF placement upon discharge: Yes, pt aware of SNF needs at DC as she does not have reliable 24/7 A at home; have spoken to step-daughter (pt's designated member on who to contact), and daughter Maudie Mercury) who is in agreement for plan for SNF once ready after short CIR stay.  Patient Condition: I have reviewed medical records from [facility], spoken with CSW, and patient and family member. I met with patient at the bedside for inpatient rehabilitation assessment.  Patient will benefit from ongoing PT and OT, can actively participate in 3 hours of therapy a day 5 days of the week, and can make measurable gains during the admission.  Patient will also benefit from the coordinated team approach during an Inpatient Acute  Rehabilitation admission.  The patient will receive intensive therapy as well as Rehabilitation physician, nursing, social worker, and care management interventions.  Due to bladder management, bowel management, safety, skin/wound care, disease management, medication administration, pain management and patient education the patient requires 24 hour a day rehabilitation nursing.  The patient is currently Mod/Max A x2 for transfers and Mod A x2 to total A for basic ADLs.  Discharge setting and therapy post discharge at skilled nursing facility is anticipated.  Patient has agreed to participate in the Acute Inpatient Rehabilitation Program and will admit 02/15/19.  Preadmission Screen Completed By:  Jhonnie Garner, 02/15/2019 9:55 AM ______________________________________________________________________   Discussed status with Dr. Naaman Plummer on 02/14/19 at 4:16PM and received approval for admission 02/15/19.  Admission Coordinator:  Jhonnie Garner, OT, time 8:48AM, Date 02/15/19.  Assessment/Plan: Diagnosis:cervical myelopathy 1. Does the need for close, 24 hr/day Medical supervision in concert with the patient's rehab needs make it unreasonable for this patient to be served in a less intensive setting? Yes 2. Co-Morbidities requiring supervision/potential complications: htn, pain mgt, neurogenic bowel and bladder 3. Due to bladder management, bowel management, safety, skin/wound care, disease management, medication administration, pain management and patient education, does the patient require 24 hr/day rehab nursing? Yes 4. Does the patient require coordinated care of a physician, rehab nurse, PT (1-2 hrs/day, 5 days/week) and OT (1-2 hrs/day, 5 days/week) to address physical and functional deficits in the context of the above medical diagnosis(es)? Yes Addressing deficits in the following areas: balance, endurance, locomotion, strength, transferring, bowel/bladder control, bathing, dressing, feeding, grooming,  toileting and psychosocial support 5. Can the patient actively participate in an intensive therapy program of at least 3 hrs of therapy 5 days a week? Yes 6. The potential for patient to make measurable gains while on inpatient rehab is good 7. Anticipated functional outcomes upon discharge from inpatients are: mod assist and max assist PT, mod assist and max assist OT, n/a SLP--reduce burden of care 8. Estimated rehab length of stay to reach the above functional goals is: 15-20 days 9. Anticipated D/C setting: Other 10. Anticipated post D/C treatments: N/A 11. Overall Rehab/Functional Prognosis: good and fair  MD Signature: Denise Staggers, MD, [facility] 02/15/2019         Revision History    Date/Time User Provider Type Action  02/15/2019 12:00 PM Denise Staggers, MD Physician Sign  02/15/2019 10:51 AM Jhonnie Garner, OT Rehab Admission Coordinator Share  02/15/2019 9:57 AM Jhonnie Garner, OT Rehab Admission Coordinator Share  View Details Report

## 2019-02-15 NOTE — Progress Notes (Signed)
Pt arroved from 3W with no complications. VSS and reporting no pain at this time. Admission complete and falls prevention education complete. Skin assessment done with second RN. Family aware of transfer.

## 2019-02-16 ENCOUNTER — Inpatient Hospital Stay (HOSPITAL_COMMUNITY): Payer: PRIVATE HEALTH INSURANCE

## 2019-02-16 ENCOUNTER — Inpatient Hospital Stay (HOSPITAL_COMMUNITY): Payer: PRIVATE HEALTH INSURANCE | Admitting: Occupational Therapy

## 2019-02-16 ENCOUNTER — Inpatient Hospital Stay (HOSPITAL_COMMUNITY): Payer: Medicare Other

## 2019-02-16 DIAGNOSIS — I10 Essential (primary) hypertension: Secondary | ICD-10-CM

## 2019-02-16 LAB — CBC WITH DIFFERENTIAL/PLATELET
Abs Immature Granulocytes: 0.04 10*3/uL (ref 0.00–0.07)
Basophils Absolute: 0 10*3/uL (ref 0.0–0.1)
Basophils Relative: 0 %
Eosinophils Absolute: 0 10*3/uL (ref 0.0–0.5)
Eosinophils Relative: 0 %
HCT: 33.4 % — ABNORMAL LOW (ref 36.0–46.0)
Hemoglobin: 11.1 g/dL — ABNORMAL LOW (ref 12.0–15.0)
Immature Granulocytes: 1 %
Lymphocytes Relative: 12 %
Lymphs Abs: 1 10*3/uL (ref 0.7–4.0)
MCH: 30.2 pg (ref 26.0–34.0)
MCHC: 33.2 g/dL (ref 30.0–36.0)
MCV: 91 fL (ref 80.0–100.0)
Monocytes Absolute: 1 10*3/uL (ref 0.1–1.0)
Monocytes Relative: 13 %
Neutro Abs: 6.1 10*3/uL (ref 1.7–7.7)
Neutrophils Relative %: 74 %
Platelets: 267 10*3/uL (ref 150–400)
RBC: 3.67 MIL/uL — ABNORMAL LOW (ref 3.87–5.11)
RDW: 13 % (ref 11.5–15.5)
WBC: 8.2 10*3/uL (ref 4.0–10.5)
nRBC: 0 % (ref 0.0–0.2)

## 2019-02-16 LAB — COMPREHENSIVE METABOLIC PANEL
ALT: 41 U/L (ref 0–44)
AST: 26 U/L (ref 15–41)
Albumin: 2.7 g/dL — ABNORMAL LOW (ref 3.5–5.0)
Alkaline Phosphatase: 85 U/L (ref 38–126)
Anion gap: 13 (ref 5–15)
BUN: 14 mg/dL (ref 8–23)
CO2: 23 mmol/L (ref 22–32)
Calcium: 8.6 mg/dL — ABNORMAL LOW (ref 8.9–10.3)
Chloride: 98 mmol/L (ref 98–111)
Creatinine, Ser: 0.54 mg/dL (ref 0.44–1.00)
GFR calc Af Amer: >60 mL/min (ref >60)
GFR calc non Af Amer: >60 mL/min (ref >60)
Glucose, Bld: 215 mg/dL — ABNORMAL HIGH (ref 70–99)
Potassium: 3.6 mmol/L (ref 3.5–5.1)
Sodium: 134 mmol/L — ABNORMAL LOW (ref 135–145)
Total Bilirubin: 0.5 mg/dL (ref 0.3–1.2)
Total Protein: 5.8 g/dL — ABNORMAL LOW (ref 6.5–8.1)

## 2019-02-16 LAB — URINALYSIS, COMPLETE (UACMP) WITH MICROSCOPIC
Bacteria, UA: NONE SEEN
Bilirubin Urine: NEGATIVE
Glucose, UA: 50 mg/dL — AB
Hgb urine dipstick: NEGATIVE
Ketones, ur: NEGATIVE mg/dL
Leukocytes,Ua: NEGATIVE
Nitrite: NEGATIVE
Protein, ur: NEGATIVE mg/dL
Specific Gravity, Urine: 1.011 (ref 1.005–1.030)
pH: 7 (ref 5.0–8.0)

## 2019-02-16 NOTE — Progress Notes (Signed)
Orthopedic Tech Progress Note Patient Details:  Yarelly Buri August 18, 1930 [id number] Called in order to [facility] for [facility]. Patient ID: Karman Casida, female   DOB: Jan 24, 1930, 85 y.o.   MRN: [id number]   [name] 02/16/2019, 10:36 AM

## 2019-02-16 NOTE — Evaluation (Addendum)
Occupational Therapy Assessment and Plan  Patient Details  Name: Denise Espinoza MRN: [id number] Date of Birth: 1930-10-04  OT Diagnosis: acute pain, cognitive deficits, muscle weakness (generalized) and quadriparesis  Rehab Potential: Rehab Potential (ACUTE ONLY): Poor ELOS: 16-18 days   Today's Date: 02/16/2019 OT Individual Time: 3790-2409 OT Individual Time Calculation (min): 59 min     Problem List:  Patient Active Problem List   Diagnosis Date Noted  . Central cord syndrome ([place]) 02/15/2019  . Stenosis of cervical spine with myelopathy ([place]) 02/04/2019    Past Medical History:  Past Medical History:  Diagnosis Date  . Allergic rhinitis   . Anosmia   . Arthritis   . Full dentures   . History of diverticulitis   . Insomnia   . Insomnia   . Senile osteoporosis   . SOB (shortness of breath)   . SVT (supraventricular tachycardia) ([place])   . Varicose vein of leg    left  . Wears glasses    Past Surgical History:  Past Surgical History:  Procedure Laterality Date  . COLON RESECTION  1989   diverticulitis  . COLONOSCOPY    . ESOPHAGEAL DILATION  2015   endoscopy  . POSTERIOR CERVICAL FUSION/FORAMINOTOMY N/A 02/11/2019   Procedure: POSTERIOR CERVICAL INSTRUMENTATION REMOVAL CERVICAL TWO;  Surgeon: Consuella Lose, MD;  Location: [facility];  Service: Neurosurgery;  Laterality: N/A;  . POSTERIOR CERVICAL LAMINECTOMY N/A 02/06/2019   Procedure: Cervical one LAMINECTOMY, Occiput-Cervical four fusion;  Surgeon: Consuella Lose, MD;  Location: [facility];  Service: Neurosurgery;  Laterality: N/A;    Assessment & Plan Clinical Impression: Denise Espinoza is an 85 year old female with history of HTN, anosmia, SVT, few months history of BLE instability who was admitted via [facility] on 02/04/19 with reports of fall 3 weeks PTA with onset of RUE>LUE numbness and weakness. She was found to have displaced odontoid fracture with severe cervical spinal stenosis C1/C2 and C3/C4. She underwent C1  laminectomy for decompression fo spinal cord with ORIF C2 fracture and PLF occiput to C4 by Dr. Kathyrn Sheriff on 02/06/19. She was making good progress but severe neck pain 5/2 with BLE instability. Follow up CT showed C2 laminar screw intruding into posterior spinal canal and right lateral recess. She was taken to OR for removal of C2 translaminar screw on 5/5 but continued to have decline in BUE strength and worsening of numbness.  Follow up MRI spine 5/6 showed removal of C2 screw with continue anterolisthesis of C1 on nonunited dens fragment with continued canal stenosis and flattening of cord with abnormal T2 cord signal and surrounding edema. Symptoms felt to be due to edema and she was started on medrol dose pack. Therapy ongoing but patient continues to be limited by cervical myelopathy with weakness BUE/BLE, decreased initiation, anxiety and painaffecting ADLs and mobility. CIR recommended due to functional decline.  Patient currently requires 2 helpers with basic self-care skills secondary to muscle weakness and muscle paralysis, decreased cardiorespiratoy endurance, abnormal tone, unbalanced muscle activation and decreased coordination, decreased initiation, decreased awareness, decreased problem solving, decreased safety awareness and delayed processing and decreased sitting balance, decreased postural control and decreased balance strategies.  Prior to hospitalization, patient could complete BADLs with independent .  Patient will benefit from skilled intervention to increase independence with basic self-care skills prior to discharge SNF.  Anticipate patient will require 24 hour supervision and Max A.   OT - End of Session Endurance Deficit: Yes OT Assessment Rehab Potential (ACUTE ONLY): Poor OT Barriers  to Discharge: Medical stability;Incontinence;Neurogenic Bowel & Bladder;Insurance for SNF coverage OT Patient demonstrates impairments in the following area(s): Balance;Sensory;Skin  Integrity;Cognition;Endurance;Motor;Pain;Safety OT Basic ADL's Functional Problem(s): Eating;Grooming;Bathing;Dressing;Toileting OT Advanced ADL's Functional Problem(s): Simple Meal Preparation OT Transfers Functional Problem(s): Toilet;Tub/Shower OT Additional Impairment(s): Fuctional Use of Upper Extremity OT Plan OT Intensity: Minimum of 1-2 x/day, 45 to 90 minutes OT Frequency: 5 out of 7 days OT Duration/Estimated Length of Stay: 16-18 days OT Treatment/Interventions: Balance/vestibular training;Disease mangement/prevention;Neuromuscular re-education;Self Care/advanced ADL retraining;Therapeutic Exercise;Wheelchair propulsion/positioning;UE/LE Strength taining/ROM;Skin care/wound managment;DME/adaptive equipment instruction;Cognitive remediation/compensation;Pain management;Community reintegration;Functional electrical stimulation;Patient/family education;Splinting/orthotics;UE/LE Coordination activities;Therapeutic Activities;Psychosocial support;Functional mobility training;Discharge planning OT Self Feeding Anticipated Outcome(s): Max A OT Basic Self-Care Anticipated Outcome(s): Max A OT Toileting Anticipated Outcome(s): No goal  OT Bathroom Transfers Anticipated Outcome(s): No goal  OT Recommendation Recommendations for Other Services: Speech consult Patient destination: [facility] (SNF) Follow Up Recommendations: Skilled nursing facility Equipment Recommended: None recommended by OT  Skilled Therapeutic Intervention Skilled OT session completed with focus on initial evaluation, education on OT role/POC, and establishment of patient-centered goals.   Pt greeted in bed, reporting pain in buttocks and neck, however not wanting medicinal interventions from RN to address. Pt completed bathing, dressing, and grooming tasks (EOB and bedlevel) during session. She has no functional use of either UE, and therefore required Total A for stated tasks. Poor postural control while  sitting unsupported, with mainly posterior/anterior sways. Unable to self correct unassisted. Pt required vcs to attempt to self correct. Once back in bed, 2 assist required for rolling Rt>Lt for perihygiene and changing soiled brief. Pt able to raise LEs against gravity a little to assist OT with washing feet and donning gripper socks. Total A for deodorant application, hand washing, and hair combing. Pt was left in bed with heels floated. UEs were supported in slight elbow extension, as they tended to drift into internal rotatation. Soft call bell was placed under Rt heel, with pt demonstrating the ability to call for assist if needed.     OT Evaluation Precautions/Restrictions  Precautions Precautions: Fall;Cervical Precaution Comments: Ok to doff brace in bed, when ambulating to bathroom, and while showering. C-collar applied in sitting position  Required Braces or Orthoses: Cervical Brace Cervical Brace: Hard collar Restrictions Weight Bearing Restrictions: No General Chart Reviewed: Yes Family/Caregiver Present: No Pain: In neck and buttocks.    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  Leisure and Hobbies: Spending time with her grandson, Atticus  Prior Function Level of Independence: Independent with basic ADLs, Independent with homemaking with ambulation, Independent with transfers Driving:  Yes Comments: PLOF information taken from chart due to pt lethargy/delayed responses ADL ADL Eating: Not assessed Grooming: Dependent Where Assessed-Grooming: Bed level Upper Body Bathing: Dependent Where Assessed-Upper Body Bathing: Edge of bed Lower Body Bathing: Other (comment)(2 assist) Where Assessed-Lower Body Bathing: Bed level Upper Body Dressing: Dependent Where Assessed-Upper Body Dressing: Bed level Lower Body Dressing: Other (Comment)(2 assist) Where Assessed-Lower Body Dressing: Bed level Toileting: Other (Comment)(2 assist) Where Assessed-Toileting: Bed level Toilet Transfer: Not assessed Tub/Shower Transfer: Not assessed  Vision Baseline Vision/History: Wears glasses Wears Glasses: At all times Patient Visual Report: Blurring of vision Additional Comments: Unable to find her glasses to thoroughly assess vision. Able to read pertinent pt information on her wall board + wall clock  Perception  Perception: Within Functional Limits Praxis Praxis: Intact Cognition Overall Cognitive Status: No family/caregiver present to determine baseline cognitive functioning Arousal/Alertness: Lethargic Orientation Level: Person;Place;Situation Person: Oriented Place: Oriented Situation: Oriented Year: 2020 Month: May Day of Week: Correct Immediate Memory Recall: Sock;Blue;Bed Memory Recall: Blue;Bed Memory Recall Blue: Without Cue Memory Recall Bed: Without Cue Attention: Sustained Sustained Attention: Appears intact Awareness: Impaired Problem Solving: Impaired Safety/Judgment: Impaired Sensation Sensation Light Touch: Impaired Detail Light Touch Impaired Details: Impaired RUE(Impaired in UE. Absent in hand. ). Difficult to thoroughly assess due to cognition/lethargy  Coordination Gross Motor Movements are Fluid and Coordinated: No Fine Motor Movements are Fluid and Coordinated: No Coordination and Movement Description: Affected by quadriparesis UEs>LEs Finger Nose  Finger Test: Unable to complete with UEs Motor  Motor Motor: Motor impersistence;Other (comment)(quadriparesis) Trunk/Postural Assessment  Cervical Assessment Cervical Assessment: Exceptions to WFL(forward head) Thoracic Assessment Thoracic Assessment: Exceptions to WFL(kyphotic) Lumbar Assessment Lumbar Assessment: Exceptions to WFL(posterior pelvic tilt) Postural Control Postural Control: Deficits on evaluation(when completing self care tasks EOB) Trunk Control: Impaired  Righting Reactions: Impaired   Balance Balance Balance Assessed: Yes Dynamic Sitting Balance Dynamic Sitting - Level of Assistance: 2: Max assist Extremity/Trunk Assessment RUE Assessment RUE Assessment: Exceptions to [facility] General Strength Comments: 2-/5 grossly  RUE Body System: Neuro  LUE Assessment LUE Assessment: Exceptions to [facility] General Strength Comments: 2-/5 grossly. More ROM noted in Lt compared to Rt LUE Body System: Neuro  Refer to Care Plan for Long Term Goals  Recommendations for other services: Other: SLP   Discharge Criteria: Patient will be discharged from OT if patient refuses treatment 3 consecutive times without medical reason, if treatment goals not met, if there is a change in medical status, if patient makes no progress towards goals or if patient is discharged from hospital.  The above assessment, treatment plan, treatment alternatives and goals were discussed and mutually agreed upon: by patient  Skeet Simmer 02/16/2019, 12:44 PM

## 2019-02-16 NOTE — Progress Notes (Signed)
[place] PHYSICAL MEDICINE & REHABILITATION PROGRESS NOTE   Subjective/Complaints: Had a fair night. Large amount of stool in rectal vault per RN. Pt doesn't have much appetite. Feels distended  ROS: Patient denies fever, rash, sore throat, blurred vision,  diarrhea, cough, shortness of breath or chest pain, joint or back pain, headache, or mood change.    Objective:   No results found. No results for input(s): WBC, HGB, HCT, PLT in the last 72 hours. No results for input(s): NA, K, CL, CO2, GLUCOSE, BUN, CREATININE, CALCIUM in the last 72 hours.  Intake/Output Summary (Last 24 hours) at 02/16/2019 1011 Last data filed at 02/15/2019 2300 Gross per 24 hour  Intake 360 ml  Output -  Net 360 ml     Physical Exam: Vital Signs Blood pressure 125/64, pulse 86, temperature 98.9 F (37.2 C), temperature source Oral, resp. rate 18, height 5' 2.99" (1.6 m), weight 61.7 kg, SpO2 96 %. Constitutional: No distress . Vital signs reviewed. HEENT: EOMI, oral membranes moist Neck: supple, c-collar Cardiovascular: RRR without murmur. No JVD    Respiratory: CTA Bilaterally without wheezes or rales. Normal effort    GI: BS +/-, non-tender, sl-distended  Musculoskeletal:  General: arthritic changes in hands  Neurological: She isalertand oriented to person, place, and time. Nocranial nerve deficit. Good insight and awareness. Normal language, memory. RUE 1+ prox to distal. LUE 2 to 2+/5 prox to distal. RLE 3/5 prox to distal. LLE 3-4/5 prox to distal. Decreased LT and pain although senses gross touch and pain below shoulders bilaterally. DTR's 2+ UE and 3+ LE's.motor exam about the same Skin: Skin iswarmand dry.    Assessment/Plan: 1. Functional deficits secondary to cervical myelopathy/central cord which require 3+ hours per day of interdisciplinary therapy in a comprehensive inpatient rehab setting.  Physiatrist is providing close team supervision and 24 hour management of  active medical problems listed below.  Physiatrist and rehab team continue to assess barriers to discharge/monitor patient progress toward functional and medical goals  Care Tool:  Bathing              Bathing assist       Upper Body Dressing/Undressing Upper body dressing        Upper body assist      Lower Body Dressing/Undressing Lower body dressing            Lower body assist       Toileting Toileting    Toileting assist       Transfers Chair/bed transfer  Transfers assist           Locomotion Ambulation   Ambulation assist              Walk 10 feet activity   Assist           Walk 50 feet activity   Assist           Walk 150 feet activity   Assist           Walk 10 feet on uneven surface  activity   Assist           Wheelchair     Assist               Wheelchair 50 feet with 2 turns activity    Assist            Wheelchair 150 feet activity     Assist          Medical Problem List and  Plan: 1.Functional and mobility deficitssecondary to C2 fracture and 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 Pt is s/p C1 laminectomy, C2 ORIF and Occiput to C4 PLIF Patient is beginning CIR therapies today including PT and OT  -bilateral WHO's  2. Antithrombotics: -DVT/anticoagulation:Mechanical:Sequential compression devices, below kneeBilateral lower extremitiesCheck dopplers. -antiplatelet therapy: N/A 3. Pain Management:Oxycodone prn--premedicate prior to therapy. Gabapentin tid for neuropathy 4. Mood:Team to provide ego support. LCSW to follow for evaluation and support. -antipsychotic agents: N/A 5. Neuropsych: This patientiscapable of making decisions on herown behalf. 6. Skin/Wound Care:Will order air matress overlay for pressure relief. 7. Fluids/Electrolytes/Nutrition:Needs assistance with meals. Monitor I/O. Check  lytes Monday 8. H/o MLJ:[id number] metoprolol daily 9. Neurogenic bowel: SSE today  -lax/supp as part of daily routine once she's cleaned out 10. Neurogenic bladder:Has been incontinent for years.  monitor PVRs- start bladder program.    LOS: 1 days A FACE TO FACE EVALUATION WAS PERFORMED  Ranelle Oyster 02/16/2019, 10:11 AM

## 2019-02-16 NOTE — Progress Notes (Signed)
Scheduled supp. Given this Am. Large amount of hard stool felt in rectum. Attempted to disimpact, but too uncomfortable for patient. Spoke with Dr. Riley Kill R/T above info. Alfredo Martinez A

## 2019-02-16 NOTE — Progress Notes (Signed)
Pt given soap suds enema and decompaction performed. Lage amount of hard stool removed. Pt reporting some relief at this time. Will continue to monitor.

## 2019-02-16 NOTE — Evaluation (Signed)
Physical Therapy Assessment and Plan  Patient Details  Name: Denise Espinoza MRN: [id number] Date of Birth: [birth year]  PT Diagnosis: Abnormal posture, Difficulty walking, Impaired cognition, Impaired sensation, Muscle weakness and Paraplegia Rehab Potential: Fair ELOS: 16-20 days   Today's Date: 02/16/2019 PT Individual Time: 1331-1441 PT Individual Time Calculation (min): 70 min    Problem List:  Patient Active Problem List   Diagnosis Date Noted  . Central cord syndrome ([place]) 02/15/2019  . Stenosis of cervical spine with myelopathy ([place]) 02/04/2019    Past Medical History:  Past Medical History:  Diagnosis Date  . Allergic rhinitis   . Anosmia   . Arthritis   . Full dentures   . History of diverticulitis   . Insomnia   . Insomnia   . Senile osteoporosis   . SOB (shortness of breath)   . SVT (supraventricular tachycardia) ([place])   . Varicose vein of leg    left  . Wears glasses    Past Surgical History:  Past Surgical History:  Procedure Laterality Date  . COLON RESECTION  1989   diverticulitis  . COLONOSCOPY    . ESOPHAGEAL DILATION  2015   endoscopy  . POSTERIOR CERVICAL FUSION/FORAMINOTOMY N/A 02/11/2019   Procedure: POSTERIOR CERVICAL INSTRUMENTATION REMOVAL CERVICAL TWO;  Surgeon: Consuella Lose, MD;  Location: [facility];  Service: Neurosurgery;  Laterality: N/A;  . POSTERIOR CERVICAL LAMINECTOMY N/A 02/06/2019   Procedure: Cervical one LAMINECTOMY, Occiput-Cervical four fusion;  Surgeon: Consuella Lose, MD;  Location: [facility];  Service: Neurosurgery;  Laterality: N/A;    Assessment & Plan Clinical Impression: Patient is a 85 y.o. year old female with history of HTN, anosmia, SVT, few months history of BLE instability who was admitted via [facility] on 02/04/19 with reports of fall 3 weeks PTA with onset of RUE>LUE numbness and weakness. She was found to have displaced odontoid fracture with severe cervical spinal stenosis C1/C2 and C3/C4. She underwent C1 laminectomy  for decompression fo spinal cord with ORIF C2 fracture and PLF occiput to C4 by Dr. Kathyrn Sheriff on 02/06/19. She was making good progress but severe neck pain 5/2 with BLE instability. Follow up CT showed C2 laminar screw intruding into posterior spinal canal and right lateral recess. She was taken to OR for removal of C2 translaminar screw on 5/5 but continued to have decline in BUE strength and worsening of numbness. Follow up MRI spine 5/6 showed removal of C2 screw with continue anterolisthesis of C1 on nonunited dens fragment with continued canal stenosis and flattening of cord with abnormal T2 cord signal and surrounding edema. Symptoms felt to be due to edema and she was started on medrol dose pack. Therapy ongoing but patient continues to be limited by cervical myelopathy with weakness BUE/BLE, decreased initiation, anxiety and painaffecting ADLs and mobility. CIR recommended due to functional decline. .  Patient transferred to CIR on 02/15/2019 .   Patient currently requires total with mobility secondary to muscle weakness and muscle paralysis, abnormal tone, decreased attention, decreased awareness and delayed processing and decreased sitting balance and decreased postural control.  Prior to hospitalization, patient was independent  with mobility and lived with Family in a House home.  Home access is  Level entry.  Patient will benefit from skilled PT intervention to maximize safe functional mobility, minimize fall risk and decrease caregiver burden for planned discharge home with 24 hour assist.  Anticipate patient will benefit from follow up [facility] at discharge.  PT - End of Session Activity Tolerance: Decreased this  session;Tolerates 30+ min activity with multiple rests Endurance Deficit: Yes PT Assessment Rehab Potential (ACUTE/IP ONLY): Fair PT Barriers to Discharge: Decreased caregiver support;Home environment access/layout PT Barriers to Discharge Comments: pt will require 24/7 assist upon  d/c, family unable to provide 24/7 assist PT Patient demonstrates impairments in the following area(s): Balance;Behavior;Endurance;Motor;Sensory;Safety;Perception;Pain PT Transfers Functional Problem(s): Bed Mobility;Bed to Chair;Car;Furniture PT Locomotion Functional Problem(s): Ambulation;Wheelchair Mobility;Stairs PT Plan PT Intensity: Minimum of 1-2 x/day ,45 to 90 minutes PT Frequency: 5 out of 7 days PT Duration Estimated Length of Stay: 16-20 days PT Treatment/Interventions: Ambulation/gait training;Cognitive remediation/compensation;Discharge planning;DME/adaptive equipment instruction;Functional mobility training;Pain management;Psychosocial support;Splinting/orthotics;Therapeutic Activities;UE/LE Strength taining/ROM;Visual/perceptual remediation/compensation;Balance/vestibular training;Community reintegration;Disease management/prevention;Patient/family education;Neuromuscular re-education;Functional electrical stimulation;Skin care/wound Landscape architect;Therapeutic Exercise;UE/LE Coordination activities;Wheelchair propulsion/positioning PT Transfers Anticipated Outcome(s): mod assist PT Locomotion Anticipated Outcome(s): n/a PT Recommendation Follow Up Recommendations: Home health PT;Skilled nursing facility Patient destination: [place] (SNF) Equipment Recommended: To be determined  Skilled Therapeutic Intervention Evaluation completed (see details above and below) with education on PT POC and goals and individual treatment initiated with focus on functional mobility, activity tolerance, and transfers. Pt supine in bed upon PT arrival, agreeable to therapy tx and reports pain in B UEs/cervical region but does not rate. Pt donned pants total assist with total assist+2 for rolling in each direction to pull pants over hips. Pt transferred to sitting this session with total assist +2, cues for techniques. Pt maintained sitting balance with min assist while checking  BP, pt reports dizziness in sitting. BP in sitting- 94/75. Pt transferred to the TIS w/c using slideboard and total assist +2, cues for anterior weightshift and techniques. BP in TIS w/c 97/78, therapist notified RN of low BP and symptoms. Therapist donned teds this session total assist. Attempted to perform sit<>stand from w/c, 3 musketeers technique, pt unable to fully stand during x 2 attempts, pt does clear hips with max +2 assist. Pt requires total assist for scooting hips back in w/c. Therapist adjusted TIS head rest for comfort, pillows under UEs for positioning. Pt left in w/c at end of session with needs in reach, call button beside head and therapist educated pt how to use this to call for help. BP in w/c at end of session 115/65.     PT Evaluation Precautions/Restrictions Precautions Precautions: Fall;Cervical Precaution Comments: Ok to doff brace in bed, when ambulating to bathroom, and while showering. C-collar applied in sitting position  Required Braces or Orthoses: Cervical Brace Cervical Brace: Hard collar Restrictions Weight Bearing Restrictions: No Home Living/Prior Functioning Home Living Available Help at Discharge: Family;Available PRN/intermittently Type of Home: House Home Access: Level entry Bathroom Shower/Tub: Multimedia programmer: Handicapped height Additional Comments: Per rehab admission documentation, family cannot provide reliable 24/7 care, and therefore pt will need SNF post CIR stay  Lives With: Family Prior Function Level of Independence: Independent with basic ADLs;Independent with homemaking with ambulation;Independent with transfers Driving: Yes Comments: PLOF information taken from chart due to pt lethargy/delayed responses Vision/Perception  Vision - Assessment Additional Comments: Unable to find her glasses to thoroughly assess vision. Able to read pertinent pt information on her wall board + wall clock  Perception Perception: Within  Functional Limits Praxis Praxis: Intact  Cognition Overall Cognitive Status: No family/caregiver present to determine baseline cognitive functioning Arousal/Alertness: Lethargic Orientation Level: Oriented to person;Oriented to place;Oriented to situation Attention: Sustained Sustained Attention: Impaired Awareness: Impaired Problem Solving: Impaired Safety/Judgment: Impaired Sensation Sensation Light Touch: Impaired Detail(sensation difficult to formally assess secondary to lethargy, appears to have  some numbness R LE compared to L LE) Light Touch Impaired Details: Impaired RUE(Impaired in UE. Absent in hand. ) Proprioception: Not tested(unable to formally assess secondary to [place]) Coordination Gross Motor Movements are Fluid and Coordinated: No Fine Motor Movements are Fluid and Coordinated: No Coordination and Movement Description: 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 Finger Nose Finger Test: Unable to complete with UEs Motor  Motor Motor: Other (comment);Motor impersistence Motor - Skilled Clinical Observations: B UE paresis>B LEs  Mobility Bed Mobility Bed Mobility: Rolling Right;Rolling Left;Sit to Supine;Supine to Sit Rolling Right: 2 Helpers Rolling Left: 2 Helpers Supine to Sit: 2 Helpers Sit to Supine: 2 Helpers Transfers Transfers: Management consultant Transfers: 2 Press photographer (Assistive device): (slideboard) Locomotion  Gait Ambulation: No Stairs / Additional Locomotion Stairs: No Architect: Yes Wheelchair Assistance: Dependent - Patient 0%  Trunk/Postural Assessment  Cervical Assessment Cervical Assessment: Exceptions to WFL(forward head) Thoracic Assessment Thoracic Assessment: Exceptions to WFL(kyphotic) Lumbar Assessment Lumbar Assessment: Exceptions to WFL(posterior pelvic tilt) Postural Control Postural Control: Deficits on evaluation(when completing self care tasks EOB) Trunk Control:  Impaired  Righting Reactions: Impaired   Balance Balance Balance Assessed: Yes Dynamic Sitting Balance Dynamic Sitting - Level of Assistance: 2: Max assist Extremity Assessment  RUE Assessment RUE Assessment: Exceptions to [facility] General Strength Comments: 2-/5 grossly  RUE Body System: Neuro LUE Assessment LUE Assessment: Exceptions to [facility] General Strength Comments: 2-/5 grossly. More ROM noted in Lt compared to Rt LUE Body System: Neuro RLE Assessment RLE Assessment: Exceptions to [facility] Passive Range of Motion (PROM) Comments: limited ankle DF to neutral RLE Strength Right Hip Flexion: 2+/5 Right Hip Extension: 3-/5 Right Hip ABduction: 2+/5 Right Knee Flexion: 2+/5 Right Knee Extension: 3-/5 Right Ankle Dorsiflexion: 2+/5 Right Ankle Plantar Flexion: 3/5 LLE Assessment LLE Assessment: Exceptions to [facility] Passive Range of Motion (PROM) Comments: limited ankle DF to neutral LLE Strength Left Hip Flexion: 2+/5 Left Hip Extension: 3-/5 Left Hip ABduction: 2+/5 Left Knee Flexion: 3-/5 Left Knee Extension: 3-/5 Left Ankle Dorsiflexion: 3-/5 Left Ankle Plantar Flexion: 3/5    Refer to Care Plan for Long Term Goals  Recommendations for other services: Neuropsych  Discharge Criteria: Patient will be discharged from PT if patient refuses treatment 3 consecutive times without medical reason, if treatment goals not met, if there is a change in medical status, if patient makes no progress towards goals or if patient is discharged from hospital.  The above assessment, treatment plan, treatment alternatives and goals were discussed and mutually agreed upon: by patient  Netta Corrigan , PT, DPT 02/16/2019, 1:46 PM

## 2019-02-17 ENCOUNTER — Inpatient Hospital Stay (HOSPITAL_COMMUNITY): Payer: Medicare Other

## 2019-02-17 ENCOUNTER — Inpatient Hospital Stay (HOSPITAL_COMMUNITY): Payer: PRIVATE HEALTH INSURANCE | Admitting: Physical Therapy

## 2019-02-17 ENCOUNTER — Inpatient Hospital Stay (HOSPITAL_COMMUNITY): Payer: PRIVATE HEALTH INSURANCE | Admitting: Occupational Therapy

## 2019-02-17 DIAGNOSIS — M7989 Other specified soft tissue disorders: Secondary | ICD-10-CM

## 2019-02-17 LAB — URINE CULTURE: Culture: NO GROWTH

## 2019-02-17 MED ORDER — WHITE PETROLATUM EX OINT
TOPICAL_OINTMENT | CUTANEOUS | Status: AC
  Administered 2019-02-17: 10:00:00
  Filled 2019-02-17: qty 28.35

## 2019-02-17 NOTE — Progress Notes (Signed)
LE venous duplex       has been completed. Preliminary results can be found under CV proc through chart review. Tamisha Nordstrom, BS, RDMS, RVT   

## 2019-02-17 NOTE — Progress Notes (Signed)
Physical Therapy Session Note  Patient Details  Name: Denise Espinoza MRN: [id number] Date of Birth: 1930/07/08  Today's Date: 02/17/2019 PT Individual Time: 0900-1015 PT Individual Time Calculation (min): 75 min   Short Term Goals: Week 1:  PT Short Term Goal 1 (Week 1): Pt will perform bed mobility with max assist +2 PT Short Term Goal 2 (Week 1): Pt will perform bed<>chair transfer with max assist +2 PT Short Term Goal 3 (Week 1): Pt will perform sit<>stand with max assist +2 PT Short Term Goal 4 (Week 1): Pt will initiate w/c propulsion using B LEs  Skilled Therapeutic Interventions/Progress Updates:    Pt received supine in bed, agreeable to PT session. No complaints of pain at rest, does have onset of cervical pain and a headache during therapy session, not rated. Rolling L/R with max A and skilled cueing for body positioning for dependent brief change and pericare following urinary incontinence. Pt is able to perform bridging in supine for dependent donning of pants. Supine to sitting EOB via logroll with max-total A. Pt reports feeling "woozy" in sitting, requests to lay back down, total A to return to supine. Supine BP 111/57 with TED hose on. Pt agreeable to attempt sitting up again. Supine to sit total A. Seated BP 101/60. Pt again feels lightheaded in sitting, requires CGA to min A to maintain sitting balance. Pt sits with flexed trunk posture and tends to lean anteriorly. Pt requesting to return to supine at end of session due to increase in headache while seated. Total A to return to supine. Assisted pt with calling in her lunch order, dependent to dial due to BUE weakness. Pt left sidelying on R side in bed with needs in reach, bed alarm in place at end of session.  Therapy Documentation Precautions:  Precautions Precautions: Fall, Cervical Precaution Comments: Ok to doff brace in bed, when ambulating to bathroom, and while showering. C-collar applied in sitting position  Required  Braces or Orthoses: Cervical Brace Cervical Brace: Hard collar Restrictions Weight Bearing Restrictions: No   Therapy/Group: Individual Therapy   Peter Congo, PT, DPT  02/17/2019, 12:37 PM

## 2019-02-17 NOTE — Progress Notes (Signed)
[place] PHYSICAL MEDICINE & REHABILITATION PROGRESS NOTE   Subjective/Complaints: Had bm yesterday after SSE  With some relief. No bm yet this morning. 102 temp reported last night as well. Pt denies having any fever or chills.   ROS: Patient denies fever, rash, sore throat, blurred vision, nausea, vomiting, diarrhea, cough, shortness of breath or chest pain, joint or back pain, headache, or mood change.    Objective:   Dg Chest Port 1 View  Result Date: 02/16/2019 CLINICAL DATA:  Fevers EXAM: PORTABLE CHEST 1 VIEW COMPARISON:  02/12/2017 FINDINGS: Cardiac shadows within normal limits. Aortic calcifications are again noted and stable. The lungs are clear. Mild basilar atelectasis is noted on the left. No acute bony abnormality is seen. IMPRESSION: Mild left basilar atelectasis. Electronically Signed   By: Alcide Clever M.D.   On: 02/16/2019 17:34   Recent Labs    02/16/19 1657  WBC 8.2  HGB 11.1*  HCT 33.4*  PLT 267   Recent Labs    02/16/19 1657  NA 134*  K 3.6  CL 98  CO2 23  GLUCOSE 215*  BUN 14  CREATININE 0.54  CALCIUM 8.6*    Intake/Output Summary (Last 24 hours) at 02/17/2019 0912 Last data filed at 02/16/2019 2300 Gross per 24 hour  Intake 720 ml  Output 250 ml  Net 470 ml     Physical Exam: Vital Signs Blood pressure 106/61, pulse 90, temperature (!) 97.5 F (36.4 C), temperature source Oral, resp. rate 18, height 5' 2.99" (1.6 m), weight 61.7 kg, SpO2 98 %. Constitutional: No distress . Vital signs reviewed. HEENT: EOMI, oral membranes moist Neck: supple, cervical collar Cardiovascular: RRR without murmur. No JVD    Respiratory: CTA Bilaterally without wheezes or rales. Normal effort    GI: BS +, non-tender, non-distended  Musculoskeletal:  General: arthritic changes in hands , wearing WHO's Neurological: more alert. Voice stronger.  RUE 1+ prox to distal. LUE 2 to 2+/5 prox to distal. RLE 3/5 prox to distal. LLE 3-4/5 prox to distal. Decreased  LT and pain although senses gross touch and pain below shoulders bilaterally.---motor and sensory stable.  DTR's 2+ UE and 3+ LE's.  Skin: Skin iswarmand dry. Psych: pleasant   Assessment/Plan: 1. Functional deficits secondary to cervical myelopathy/central cord which require 3+ hours per day of interdisciplinary therapy in a comprehensive inpatient rehab setting.  Physiatrist is providing close team supervision and 24 hour management of active medical problems listed below.  Physiatrist and rehab team continue to assess barriers to discharge/monitor patient progress toward functional and medical goals  Care Tool:  Bathing        Body parts bathed by helper: Right arm, Left arm, Chest, Abdomen, Front perineal area, Buttocks, Right upper leg, Left upper leg, Right lower leg, Left lower leg, Face     Bathing assist Assist Level: 2 Helpers     Upper Body Dressing/Undressing Upper body dressing   What is the patient wearing?: Hospital gown only    Upper body assist Assist Level: Dependent - Patient 0%    Lower Body Dressing/Undressing Lower body dressing      What is the patient wearing?: Incontinence brief     Lower body assist Assist for lower body dressing: 2 Helpers     Toileting Toileting    Toileting assist Assist for toileting: 2 Helpers     Transfers Chair/bed transfer  Transfers assist     Chair/bed transfer assist level: 2 Helpers     Locomotion Ambulation  Ambulation assist   Ambulation activity did not occur: Safety/medical concerns          Walk 10 feet activity   Assist  Walk 10 feet activity did not occur: Safety/medical concerns        Walk 50 feet activity   Assist Walk 50 feet with 2 turns activity did not occur: Safety/medical concerns         Walk 150 feet activity   Assist Walk 150 feet activity did not occur: Safety/medical concerns         Walk 10 feet on uneven surface  activity   Assist Walk  10 feet on uneven surfaces activity did not occur: Safety/medical concerns         Wheelchair     Assist Will patient use wheelchair at discharge?: Yes Type of Wheelchair: Manual    Wheelchair assist level: Dependent - Patient 0% Max wheelchair distance: 150 ft    Wheelchair 50 feet with 2 turns activity    Assist        Assist Level: Dependent - Patient 0%   Wheelchair 150 feet activity     Assist     Assist Level: Dependent - Patient 0%    Medical Problem List and Plan: 1.Functional and mobility deficitssecondary to C2 fracture and 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 Pt is s/p C1 laminectomy, C2 ORIF and Occiput to C4 PLIF Patient is beginning CIR therapies today including PT and OT  -bilateral WHO's  2. Antithrombotics: -DVT/anticoagulation:Mechanical:Sequential compression devices, below kneeBilateral lower extremities. Dopplers pending. -antiplatelet therapy: N/A 3. Pain Management:Oxycodone prn--premedicate prior to therapy. Gabapentin tid for neuropathy 4. Mood:Team to provide ego support. LCSW to follow for evaluation and support. -antipsychotic agents: N/A 5. Neuropsych: This patientiscapable of making decisions on herown behalf. 6. Skin/Wound Care:Will order air matress overlay for pressure relief. 7. Fluids/Electrolytes/Nutrition:Needs assistance with meals. Monitor I/O. Check lytes Monday 8. H/o ACZ:[id number] metoprolol daily 9. Neurogenic bowel: SSE today  -lax/supp as part of daily routine once she's cleaned out 10. Neurogenic bladder:Has been incontinent for years.  monitor PVRs- start bladder program. 11. Fever: afebrile this morning. Pt denied any chills last night.   -cbc, bmet, cxr and ua negative, ucx pending  -continue to monitor closely  -if fever again, check bc x 2   -dopplers pending  LOS: 2 days A FACE TO FACE EVALUATION WAS PERFORMED  Ranelle Oyster 02/17/2019, 9:12 AM

## 2019-02-17 NOTE — Progress Notes (Signed)
Patient c/o double vision, patient states it was there but she thinks it is going to go away. Assessment revealed patient endorses double vision. PA notified and order given for implementation. Will continue to monitor patient.

## 2019-02-17 NOTE — Progress Notes (Signed)
Physical Therapy Session Note  Patient Details  Name: Madoline Bhatt MRN: [id number] Date of Birth: [birth year]  Today's Date: 02/17/2019 PT Individual Time: 1433-1530 PT Individual Time Calculation (min): 57 min   Short Term Goals: Week 1:  PT Short Term Goal 1 (Week 1): Pt will perform bed mobility with max assist +2 PT Short Term Goal 2 (Week 1): Pt will perform bed<>chair transfer with max assist +2 PT Short Term Goal 3 (Week 1): Pt will perform sit<>stand with max assist +2 PT Short Term Goal 4 (Week 1): Pt will initiate w/c propulsion using B LEs  Skilled Therapeutic Interventions/Progress Updates: Pt presented in TIS agreeable to therapy. Pt noted to be slumped over with Cx collar in poor positioning. Pt's collar repositioned with pt indicating improved comfort. Pt transported to rehab gym as PTA adjusted leg rests for improved positioning. Pt tilted back in chair and was able to participated in LE therex including SAQ, hip flexion, towel squeezes, and use of elbow to shift trunk laterally. Each activity performed x 10 bilaterally. Pt transported back to room and performed SB transfer to bed with 4in step placed under pt's feet for improved support. SB transfer performed maxA x 2. Pt participated in sitting balance at EOB with pt able to perform forward leans to neutral, AA scapular retraction, and PTA placing pt in neutral and pt attempting to maintain in midline. Pt noted to become very fatigued with activity. Pt transferred to supine dependent x 2 and participated in supine LE therex. Pt performed hip flexion from hooklying, hip er in small range which emphasis for controlled movement and bridges. Pt also practiced rolling L/R maxA x1 with assistance for UE placement and increased core activation to facilitate trunk rotation. Pt then indicating need for BM. Performed additional rolling with maxA for bedpan placement (+BM) with total assist for brief and clothing management. Once completed pt  repositioned for comfort and left with soft touch button within reach, bed alarm on and needs met.      Therapy Documentation Precautions:  Precautions Precautions: Fall, Cervical Precaution Comments: Ok to doff brace in bed, when ambulating to bathroom, and while showering. C-collar applied in sitting position  Required Braces or Orthoses: Cervical Brace Cervical Brace: Hard collar Restrictions Weight Bearing Restrictions: No General:   Vital Signs: Therapy Vitals Temp: 99.1 F (37.3 C) Temp Source: Oral Pulse Rate: 87 Resp: 18 BP: (!) 119/59 Patient Position (if appropriate): Lying Oxygen Therapy SpO2: 97 % O2 Device: Room Air   Therapy/Group: Individual Therapy  Arlys Scatena  Kinaya Hilliker, PTA  02/17/2019, 4:57 PM

## 2019-02-17 NOTE — Progress Notes (Signed)
At 0140 complained of feeling full in rectum. Large amount of soft stool in rectum. PRN fleets enema given, still needed to be disimpacted. PRN trazodone 25mg 's given per patient's request. Incontinent of urine. Denise Espinoza A

## 2019-02-17 NOTE — Progress Notes (Signed)
Occupational Therapy Session Note  Patient Details  Name: Denise Espinoza MRN: [id number] Date of Birth: 06/22/1930  Today's Date: 02/17/2019 OT Individual Time: 1130-1230 OT Individual Time Calculation (min): 60 min    Short Term Goals: Week 1:  OT Short Term Goal 1 (Week 1): Pt will bathe 1 body area using AE as needed  OT Short Term Goal 2 (Week 1): Pt will maintain static sitting balance for 1 minute with Mod A during self care to improve trunk control OT Short Term Goal 3 (Week 1): Pt will complete 1 grooming task w/c level at sink using AE as needed  Skilled Therapeutic Interventions/Progress Updates:    Pt seen for OT session focusing on ADL re-training and functional mobility. Pt in supine upon arrival, agreeable to tx session and denying pain.  She transferred to sitting EOB with +2 assist, able to bring B LEs off EOB with increased time. She required min-mod A for static sitting balance EOB, however, total A LOB when assist not provided. Pt able to shift weight R/L and anterior/posterior in attempts to maintain balance.  +2 total A sliding board transfer to tilt-in-space w/c. Hair care completed total A. She was able to assist with pulling arms out of hospital gown and minimal elbow extension to assist with threading UEs into shirt- total A overall to don shirt.  Pt able to complete anterior weight shift from w/c with min-mod A and reposition back to midline with cuing.  Pt desiring to talk to family members and have them bring needed items. Assisted pt with calling family and therapist provided education regarding needed ADL items, POC and PT/OT goals.  Pt left in tilt-in-space w/c at end of session, positioned with pillows and still on speaker phone with family. RN and NT made aware of pt's position and her need for assist. Education provided throughout session regarding role of OT, POC, benefits of OOB and mobility, bowel program, and d/c planning.   Therapy  Documentation Precautions:  Precautions Precautions: Fall, Cervical Precaution Comments: Ok to doff brace in bed, when ambulating to bathroom, and while showering. C-collar applied in sitting position  Required Braces or Orthoses: Cervical Brace Cervical Brace: Hard collar Restrictions Weight Bearing Restrictions: No   Therapy/Group: Individual Therapy  Ahmod Gillespie L 02/17/2019, 7:13 AM

## 2019-02-17 NOTE — Plan of Care (Signed)
  Problem: Consults Goal: RH SPINAL CORD INJURY PATIENT EDUCATION Description  See Patient Education module for education specifics.  Outcome: Progressing   Problem: SCI BOWEL ELIMINATION Goal: RH STG MANAGE BOWEL WITH ASSISTANCE Description STG Manage Bowel with max Assistance.  Outcome: Progressing Goal: RH STG SCI MANAGE BOWEL WITH MEDICATION WITH ASSISTANCE Description STG SCI Manage bowel with medication with mod assistance.  Outcome: Progressing Goal: RH STG SCI MANAGE BOWEL PROGRAM W/ASSIST OR AS APPROPRIATE Description STG SCI Manage bowel program w/total assist or as appropriate.  Outcome: Progressing   Problem: SCI BLADDER ELIMINATION Goal: RH STG MANAGE BLADDER WITH ASSISTANCE Description STG Manage Bladder With max Assistance  Outcome: Progressing Goal: RH STG SCI MANAGE BLADDER PROGRAM W/ASSISTANCE Description With max assistance  Outcome: Progressing   Problem: RH SKIN INTEGRITY Goal: RH STG SKIN FREE OF INFECTION/BREAKDOWN Outcome: Progressing Goal: RH STG MAINTAIN SKIN INTEGRITY WITH ASSISTANCE Description STG Maintain Skin Integrity With max Assistance.  Outcome: Progressing Goal: RH STG ABLE TO PERFORM INCISION/WOUND CARE W/ASSISTANCE Description STG Able To Perform Incision/Wound Care With mod Assistance.  Outcome: Progressing   Problem: RH SAFETY Goal: RH STG ADHERE TO SAFETY PRECAUTIONS W/ASSISTANCE/DEVICE Description STG Adhere to Safety Precautions With min  Assistance/Device.  Outcome: Progressing   Problem: RH PAIN MANAGEMENT Goal: RH STG PAIN MANAGED AT OR BELOW PT'S PAIN GOAL Description Rate pain < 3 on pain scale  Outcome: Progressing   Problem: RH KNOWLEDGE DEFICIT SCI Goal: RH STG INCREASE KNOWLEDGE OF SELF CARE AFTER SCI Outcome: Progressing

## 2019-02-17 NOTE — IPOC Note (Signed)
Overall Plan of Care [facility]) Patient Details Name: Everlea Boddicker MRN: [id number] DOB: 04/24/1930  Admitting Diagnosis: <principal problem not specified>  Hospital Problems: Active Problems:   Central cord syndrome [facility])     Functional Problem List: Nursing Safety, Sensory, Bladder, Bowel, Skin Integrity, Edema, Endurance, Medication Management, Motor, Nutrition, Perception, Pain  PT Balance, Behavior, Endurance, Motor, Sensory, Safety, Perception, Pain  OT Balance, Sensory, Skin Integrity, Cognition, Endurance, Motor, Pain, Safety  SLP    TR         Basic ADL's: OT Eating, Grooming, Bathing, Dressing, Toileting     Advanced  ADL's: OT Simple Meal Preparation     Transfers: PT Bed Mobility, Bed to Chair, Car, Occupational psychologist, Research scientist (life sciences): PT Ambulation, Psychologist, prison and probation services, Stairs     Additional Impairments: OT Fuctional Use of Upper Extremity  SLP        TR      Anticipated Outcomes Item Anticipated Outcome  Self Feeding Max A  Swallowing      Basic self-care  Max A  Toileting  No goal   Bathroom Transfers No goal  Bowel/Bladder  Assistance and supervison PRN  Transfers  max assist  Locomotion  n/a  Communication     Cognition     Pain  Pain at pts goal of 0  Safety/Judgment  Supervision and assistance PRN    Therapy Plan: PT Intensity: Minimum of 1-2 x/day ,45 to 90 minutes PT Frequency: 5 out of 7 days PT Duration Estimated Length of Stay: 16-20 days OT Intensity: Minimum of 1-2 x/day, 45 to 90 minutes OT Frequency: 5 out of 7 days OT Duration/Estimated Length of Stay: 16-18 days     Due to the current state of emergency, patients may not be receiving their 3-hours of Medicare-mandated therapy.   Team Interventions: Nursing Interventions Patient/Family Education, Pain Management, Bladder Management, Medication Management, Bowel Management, Skin Care/Wound Management, Discharge Planning  PT interventions Ambulation/gait  training, Cognitive remediation/compensation, Discharge planning, DME/adaptive equipment instruction, Functional mobility training, Pain management, Psychosocial support, Splinting/orthotics, Therapeutic Activities, UE/LE Strength taining/ROM, Visual/perceptual remediation/compensation, Warden/ranger, Community reintegration, Disease management/prevention, Equities trader education, Neuromuscular re-education, Functional electrical stimulation, Skin care/wound management, Stair training, Therapeutic Exercise, UE/LE Coordination activities, Wheelchair propulsion/positioning  OT Interventions Warden/ranger, Disease mangement/prevention, Neuromuscular re-education, Self Care/advanced ADL retraining, Therapeutic Exercise, Wheelchair propulsion/positioning, UE/LE Strength taining/ROM, Skin care/wound managment, DME/adaptive equipment instruction, Cognitive remediation/compensation, Pain management, Community reintegration, Functional electrical stimulation, Patient/family education, Splinting/orthotics, UE/LE Coordination activities, Therapeutic Activities, Psychosocial support, Functional mobility training, Discharge planning  SLP Interventions    TR Interventions    SW/CM Interventions Discharge Planning, Psychosocial Support, Patient/Family Education   Barriers to Discharge MD  Medical stability  Nursing Incontinence, Medication compliance, Behavior    PT Decreased caregiver support, Home environment access/layout pt will require 24/7 assist upon d/c, family unable to provide 24/7 assist  OT Medical stability, Incontinence, Neurogenic Bowel & Bladder, Insurance for SNF coverage    SLP      SW       Team Discharge Planning: Destination: PT-Skilled Nursing Facility (SNF) ,OT- Skilled Nursing Facility (SNF) , SLP-  Projected Follow-up: PT-Home health PT, Skilled nursing facility, OT-  Skilled nursing facility, SLP-  Projected Equipment Needs: PT-To be determined, OT- None  recommended by OT, SLP-  Equipment Details: PT- , OT-  Patient/family involved in discharge planning: PT- Patient,  OT-Patient, SLP-   MD ELOS: 14-18 days Medical Rehab Prognosis:  Excellent Assessment: The patient has been admitted  for CIR therapies with the diagnosis of cervical myelopathy. The team will be addressing functional mobility, strength, stamina, balance, safety, adaptive techniques and equipment, self-care, bowel and bladder mgt, patient and caregiver education, NMR, pain mgt, skin care. Goals have been set at max assist with mobility/self-care in order to lessen burden of care at next venue of care..   Due to the current state of emergency, patients may not be receiving their 3 hours per day of Medicare-mandated therapy.    Ranelle Oyster, MD, FAAPMR      See Team Conference Notes for weekly updates to the plan of care

## 2019-02-17 NOTE — Progress Notes (Signed)
Patient information reviewed and entered into eRehab System by Becky Laquentin Loudermilk, PPS coordinator. Information including medical coding, function ability, and quality indicators will be reviewed and updated through discharge.   

## 2019-02-18 ENCOUNTER — Inpatient Hospital Stay (HOSPITAL_COMMUNITY): Payer: PRIVATE HEALTH INSURANCE

## 2019-02-18 ENCOUNTER — Inpatient Hospital Stay (HOSPITAL_COMMUNITY): Payer: PRIVATE HEALTH INSURANCE | Admitting: Physical Therapy

## 2019-02-18 ENCOUNTER — Inpatient Hospital Stay (HOSPITAL_COMMUNITY): Payer: PRIVATE HEALTH INSURANCE | Admitting: Occupational Therapy

## 2019-02-18 NOTE — Progress Notes (Signed)
Occupational Therapy Session Note  Patient Details  Name: Denise Espinoza MRN: [id number] Date of Birth: [birth year]  Today's Date: 02/18/2019 OT Individual Time: 0768-0881 OT Individual Time Calculation (min): 60 min    Short Term Goals: Week 1:  OT Short Term Goal 1 (Week 1): Pt will bathe 1 body area using AE as needed  OT Short Term Goal 2 (Week 1): Pt will maintain static sitting balance for 1 minute with Mod A during self care to improve trunk control OT Short Term Goal 3 (Week 1): Pt will complete 1 grooming task w/c level at sink using AE as needed  Skilled Therapeutic Interventions/Progress Updates:    Pt seen for OT session focusing on functional mobility. Pt in supine upon arrival, RN present administering pain meds as pt with complaints of headache. Pt agreeable to cont with therpy session as able. Changed pt' ervical brace pad's from supine level and hygiene provided around neck. Education provided regarding care of cervical brace pads.  Pt having had inontinen urination in brief without awareness. Total A for hygien and donning new brief and pants, rolling with mod A, ith UEs placed, able to use bed rails to assist with pulling self into rolling position. She transferred to sitting EOB with +2 assist. Min-mod static sitting balance EOB with multi-modal and tactile cuing to promote upright posture. Pt with poor proprioceptive abilities o know which way she is loosing balance, however, able to correct with VCs. +2 sliding board transfer to tilt-in-space w/c.  Pt stood 3- musketeer style with mod-max A. VCs for LE activation, able to squeeze glutes and bring self into more erect position. Pt tolerating ~10 seconds before requiring seated rest break.   Trialed used of STEDY. Max A to place and maintain UEs on bar, pt able to maintain functional grasp on bar and mod A +2 to stand into STEDY with max cuing for upright posture. Pt standing in STEDY ~15 seconds with steadying assist for  balance and VCs. Recommend cont practice with STEDY prior to havving pt transfer this method with nursing staff. Upon standing, pt voiced urge for BM. Transferred back to chair then transferred via sliding board in manner described above. Pt left on bed pan at end of session, soft touch call bell in reach and NT made aware.  Therapy Documentation Precautions:  Precautions Precautions: Fall, Cervical Precaution Comments: Ok to doff brace in bed, when ambulating to bathroom, and while showering. C-collar applied in sitting position  Required Braces or Orthoses: Cervical Brace Cervical Brace: Hard collar Restrictions Weight Bearing Restrictions: No   Therapy/Group: Individual Therapy  Jaelin Fackler L 02/18/2019, 7:02 AM

## 2019-02-18 NOTE — Progress Notes (Signed)
Occupational Therapy Session Note  Patient Details  Name: Denise Espinoza MRN: [id number] Date of Birth: 1929-12-11  Today's Date: 02/18/2019 OT Individual Time: 1101-1134 OT Individual Time Calculation (min): 33 min    Short Term Goals: Week 1:  OT Short Term Goal 1 (Week 1): Pt will bathe 1 body area using AE as needed  OT Short Term Goal 2 (Week 1): Pt will maintain static sitting balance for 1 minute with Mod A during self care to improve trunk control OT Short Term Goal 3 (Week 1): Pt will complete 1 grooming task w/c level at sink using AE as needed  Skilled Therapeutic Interventions/Progress Updates:    Pt completed supine to sit EOB with max assist to start session.  She needed min assist for static sitting balance EOB with mod assist for reciprical scooting to the edge of the bed.  She then worked on sit to stand and standing endurance with use of the Stedy and total assist +2 (pt 50%).  Therapist and tech positioned her UEs on the Stedy bar and then she was able to maintain her grip for sit to stand and standing with min assist on the right.  Standing endurance was for 2 mins on the first interval and 1.5 mins on the second and third.  Pt reports increased neck pain secondary to maintaining cervical flexion.  Placed pt back in supine once standing was completed with pt working on BUE AROM shoulder flexion and elbow flexion for 1 set of 10 reps.  Pt with greater strength noted on the left compared to the right.  Call button and phone in reach at end of session.    Therapy Documentation Precautions:  Precautions Precautions: Fall, Cervical Precaution Comments: Ok to doff brace in bed, when ambulating to bathroom, and while showering. C-collar applied in sitting position  Required Braces or Orthoses: Cervical Brace Cervical Brace: Hard collar Restrictions Weight Bearing Restrictions: No  Pain: Pain Assessment Pain Scale: Faces Pain Score: 7  Faces Pain Scale: Hurts a little  bit Pain Type: Acute pain Pain Location: Neck Pain Descriptors / Indicators: Discomfort Pain Frequency: Intermittent Pain Onset: With Activity Pain Intervention(s): Repositioned;Emotional support ADL: See Care Tool Section for some details of ADL  Therapy/Group: Individual Therapy  Jamil Armwood OTR/L 02/18/2019, 12:10 PM
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
## 2019-02-18 NOTE — Care Management (Signed)
[facility] Individual Statement of Services  Patient Name:  Denise Espinoza  Date:  02/18/2019  Welcome to the [facility].  Our goal is to provide you with an individualized program based on your diagnosis and situation, designed to meet your specific needs.  With this comprehensive rehabilitation program, you will be expected to participate in at least 3 hours of rehabilitation therapies Monday-Friday, with modified therapy programming on the weekends.  Your rehabilitation program will include the following services:  Physical Therapy (PT), Occupational Therapy (OT), 24 hour per day rehabilitation nursing, Therapeutic Recreaction (TR), Neuropsychology, Case Management (Social Worker), Rehabilitation Medicine, Nutrition Services and Pharmacy Services  Weekly team conferences will be held on Tuesdays to discuss your progress.  Your Social Worker will talk with you frequently to get your input and to update you on team discussions.  Team conferences with you and your family in attendance may also be held.  Expected length of stay: 2 weeks   Overall anticipated outcome: max assistance  Depending on your progress and recovery, your program may change. Your Social Worker will coordinate services and will keep you informed of any changes. Your Social Worker's name and contact numbers are listed  below.  The following services may also be recommended but are not provided by the [facility]:         Home Health Rehabiltiation Services  Outpatient Rehabilitation Services  Skilled Nursing Facility  Arrangements will be made to provide these services after discharge if needed.  Arrangements include referral to agencies that provide these services.  Your insurance has been verified to be:  Medicare and [place] [employer] Your primary doctor is:  Carlynn Purl  Pertinent information will be shared with your doctor and your insurance  company.  Social Worker:  [place], [place] [phone] or ([phone]   Information discussed with and copy given to patient by: Amada Jupiter, 02/18/2019, 4:47 PM

## 2019-02-18 NOTE — Progress Notes (Signed)
Physical Therapy Session Note  Patient Details  Name: Denise Espinoza MRN: [id number] Date of Birth: 1930/03/02  Today's Date: 02/18/2019 PT Individual Time: 4098-1191 PT Individual Time Calculation (min): 42 min   Short Term Goals: Week 1:  PT Short Term Goal 1 (Week 1): Pt will perform bed mobility with max assist +2 PT Short Term Goal 2 (Week 1): Pt will perform bed<>chair transfer with max assist +2 PT Short Term Goal 3 (Week 1): Pt will perform sit<>stand with max assist +2 PT Short Term Goal 4 (Week 1): Pt will initiate w/c propulsion using B LEs  Skilled Therapeutic Interventions/Progress Updates:    Patient in supine and reports fatigued from prior therapies today and that pain in neck/shoulders is increased.  C/o someone leaving pill for her to take on her own and not sure why they didn't help her (seemed to think it was MD, though upon further questioning agreed it was RN.)   Patient agreeable to try EOB activities.  Already wearing Aspen cervical collar, performed supine to sit with mod A +2 for trunk lifting as already moved legs off bed prior to able to cue for spinal precautions.  Patient sitting EOB working on balance, finding and keeping COM over BOS and scapular retraction with head elevation with education on postural support from alignment as opposed to muscular activation.  Patient willing to attempt it to stand and performed with mod to max A +2 with cues for hip extension, support for knees and balance as maintains PPT and legs braced on EOB (was reportedly fearful of falling already.)  Patient able to stand approx 10 sec prior to returning to sit.  Denies dizziness today in standing and performed about 3 more times in session due to needing to change brief soiled with urine (reports had BM this morning, but unable to tell when needing to urinate, but can tell when she has been usually, but this time seemingly unaware.)  Patient assisted with mod/max A for scooting lateral up toward  HOB.  Patient sit to supine with+2 A.  Positioned with pillows under arms and HOB elevated.  Left with soft touch call bell in reach and oriented to where to touch if needed with L hand. Bed alarm activated.   Therapy Documentation Precautions:  Precautions Precautions: Fall, Cervical Precaution Comments: Ok to doff brace in bed, when ambulating to bathroom, and while showering. C-collar applied in sitting position  Required Braces or Orthoses: Cervical Brace Cervical Brace: Hard collar Restrictions Weight Bearing Restrictions: No Pain: Pain Assessment Pain Score: 8  Pain Type: Neuropathic pain Pain Location: Neck Pain Descriptors / Indicators: Aching;Radiating Pain Onset: With Activity Pain Intervention(s): Repositioned    Therapy/Group: Individual Therapy  Elray Mcgregor  Sheran Lawless, PT 02/18/2019, 5:23 PM

## 2019-02-18 NOTE — Progress Notes (Addendum)
Physical Therapy Session Note  Patient Details  Name: Denise Espinoza MRN: [id number] Date of Birth: 07/06/1930  Today's Date: 02/18/2019 PT Individual Time: 8250-0370 PT Individual Time Calculation (min): 60 min   Short Term Goals: Week 1:  PT Short Term Goal 1 (Week 1): Pt will perform bed mobility with max assist +2 PT Short Term Goal 2 (Week 1): Pt will perform bed<>chair transfer with max assist +2 PT Short Term Goal 3 (Week 1): Pt will perform sit<>stand with max assist +2 PT Short Term Goal 4 (Week 1): Pt will initiate w/c propulsion using B LEs  Skilled Therapeutic Interventions/Progress Updates:    Pt received supine in bed and agreeable to therapy session; however, requesting not to perform slide board transfers because the skin on her bottom is sore.  Pt wearing hard collar for entire session. Performed supine therapeutic exercises 10 repetitions in B LEs consisting of the following: supine heel slides, supine mini-bridges, ankle DF/PF against manual resistance, and sidelying clamshells with assist to maintain sidelying, and upon coming to sitting long arch quads - cuing throughout for proper technique/form. Donned LB clothing with max assist and pt able to bridge for assist with clothing. Supine>sidelying>sit with max assist for B LE management and trunk upright with cuing for sequencing. In sitting, reports she is having a "burning pressure" at her rectum and feels she needs to have a BM but reports she attempted earlier and was unsuccessful - RN notified and present for medication administration. Performed sit<>stand from EOB with 3 Musketeer style and max assist of 1 for lifting and min assist of 2nd person for balance - cuing for hip/knee extension and intermittent blocking of RLE buckling - pt maintained standing for ~1-13minutes with max assist of 1 and min assist of 2nd person. Performed stand pivot transfer EOB to [facility] with max assist of 2 for standing balance, blocking stance knee  buckling, and lifting leg for stepping. Pt positioned with B LEs elevated for improved pelvic alignment to promote BM and pt continent of large BM. Performed sit<>stand from [facility] with max assist of 1 and 2nd person assisting with LB clothing management and peri-care total assist. Stand pivot transfer BSC to EOB with max assist of 2 for balance, blocking knee buckling, and stepping LEs. Sit to supine total assist of 2 for trunk descent and B LE management. Pt left supine in bed with needs in reach and bed alarm on.   Therapy Documentation Precautions:  Precautions Precautions: Fall, Cervical Precaution Comments: Ok to doff brace in bed, when ambulating to bathroom, and while showering. C-collar applied in sitting position  Required Braces or Orthoses: Cervical Brace Cervical Brace: Hard collar Restrictions Weight Bearing Restrictions: No  Pain:   Reported a "burning pressure" pain at rectum that she reports began dissipating after BM.   Therapy/Group: Individual Therapy  Ginny Forth, PT, DPT  02/18/2019, 7:44 AM

## 2019-02-18 NOTE — Progress Notes (Signed)
Social Work  Social Work Assessment and Plan  Patient Details  Name: Denise Espinoza MRN: [id number] Date of Birth: 09-20-1930  Today's Date: 02/18/2019  Problem List:  Patient Active Problem List   Diagnosis Date Noted  . Central cord syndrome (HCC) 02/15/2019  . 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   Past Medical History:  Past Medical History:  Diagnosis Date  . Allergic rhinitis   . Anosmia   . Arthritis   . Full dentures   . History of diverticulitis   . Insomnia   . Insomnia   . Senile osteoporosis   . SOB (shortness of breath)   . SVT (supraventricular tachycardia) (HCC)   . Varicose vein of leg    left  . Wears glasses    Past Surgical History:  Past Surgical History:  Procedure Laterality Date  . COLON RESECTION  1989   diverticulitis  . COLONOSCOPY    . ESOPHAGEAL DILATION  2015   endoscopy  . POSTERIOR CERVICAL FUSION/FORAMINOTOMY N/A 02/11/2019   Procedure: POSTERIOR CERVICAL INSTRUMENTATION REMOVAL CERVICAL TWO;  Surgeon: Lisbeth Renshaw, MD;  Location: [facility] OR;  Service: Neurosurgery;  Laterality: N/A;  . POSTERIOR CERVICAL LAMINECTOMY N/A 02/06/2019   Procedure: Cervical one LAMINECTOMY, Occiput-Cervical four fusion;  Surgeon: Lisbeth Renshaw, MD;  Location: [facility] OR;  Service: Neurosurgery;  Laterality: N/A;   Social History:  reports that she has never smoked. She has never used smokeless tobacco. She reports current alcohol use. She reports that she does not use drugs.  Family / Support Systems Marital Status: Widow/Widower How Long?: 2008 Patient Roles: Parent, Other (Comment)(grandparent) Children: daughter, [name] (lives with pt) @ [phone];  step-daughter, [name] [facility]) @ [phone];  son, Kathlene November (lives with pt) Other Supports: grandaughter, [name] (lives with pt) @ [phone] Anticipated Caregiver: SNF Ability/Limitations of Caregiver: Family unable to provide 24/7 assistance due to  work demands and their own physical limitations due to health issues. Caregiver Availability: Other (Comment)(SNF ) Family Dynamics: Pt notes that all family is supportive, however, several children are disabled and some living iwth her.  Social History Preferred language: English Religion: Baptist Cultural Background: NA Read: Yes Write: Yes Employment Status: Retired Marine scientist Issues: None Guardian/Conservator: None - per MD, pt is capable of making decisions on her own behalf.   Abuse/Neglect Abuse/Neglect Assessment Can Be Completed: Yes Physical Abuse: Denies Verbal Abuse: Denies Sexual Abuse: Denies Exploitation of patient/patient's resources: Denies Self-Neglect: Denies  Emotional Status Pt's affect, behavior and adjustment status: Pt lying in bed and presents with rather flat, frustrated expression. Admits she is upset with her situation and about plan to go to SNF after CIR.  She is realistic about her family member limitations in providing physical assist to her due to their own health issues.  Feel she will benefit from neuropsychology support and will refer. Recent Psychosocial Issues: Declining fxn over past several weeks. Psychiatric History: None Substance Abuse History: None  Patient / Family Perceptions, Expectations & Goals Pt/Family understanding of illness & functional limitations: Pt and family with good, basic understanding of her fall, injury, surgery perform and current fucntional deficite/ need for CIR. Premorbid pt/family roles/activities: Pt was independent overall prior to ~ 3 weeks PTA when she had a fall at home. Anticipated changes in roles/activities/participation: Per pt and family, no one is able to provide caregiver support at the level pt will need. Pt/family expectations/goals: "I just hope I can get into [facility] (local SNF)  [employer]  Agencies: None Premorbid Home Care/DME Agencies:  None Transportation available at discharge: No Resource referrals recommended: Neuropsychology  Discharge Planning Living Arrangements: Children, Other relatives Support Systems: Children, Other relatives Type of Residence: Private residence Insurance Resources: [employer]are, Media plannerrivate Insurance (specify)([employer] (supp)) Financial Resources: Restaurant manager, fast foodocial Security Financial Screen Referred: No Living Expenses: Own Money Management: Patient Does the patient have any problems obtaining your medications?: No Home Management: pt and family were sharing responsibilities Patient/Family Preliminary Plans: Pt and family agreed that plan is for pt to transition to SNF following CIR. Social Work Anticipated Follow Up Needs: SNF Expected length of stay: 16-20 days  Clinical Impression Very unfortunate woman who suffered a fall several weeks ago and has undergone surgery but has significant physical deficits which are expected to be long term.  Pt and family all realistic that family cannot meet anticipated care needs for pt and plan, upon CIR admit, is for pt to d/c to SNF.  Pt with depressed mood about the situation and will refer for neuropsychology for additional support.  Chantele Corado 02/18/2019, 4:45 PM

## 2019-02-18 NOTE — Patient Care Conference (Signed)
Inpatient RehabilitationTeam Conference and Plan of Care Update Date: 02/18/2019   Time: 2:25 PM    Patient Name: Denise Espinoza      Medical Record Number: [id number]  Date of Birth: 09-Jun-1930 Sex: Female         Room/Bed: 4W04C/4W04C-01 Payor Info: Payor: MEDICARE / Plan: MEDICARE PART A AND B / Product Type: *No Product type* /    Admitting Diagnosis: cervical myelopathy  Admit Date/Time:  02/15/2019  4:54 PM Admission Comments: No comment available   Primary Diagnosis:  <principal problem not specified> Principal Problem: <principal problem not specified>  Patient Active Problem List   Diagnosis Date Noted  . Central cord syndrome (HCC) 02/15/2019  . 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    Expected Discharge Date: Expected Discharge Date: (about two weeks, then SNF)  Team Members Present: Physician leading conference: Dr. Faith Rogue Social Worker Present: Amada Jupiter, LCSW Nurse Present: Keturah Barre, RN PT Present: Other (comment)(Cyndi Sharee Pimple, PT) OT Present: Amy Rounds, OT PPS Coordinator present : Fae Pippin     Current Status/Progress Goal Weekly Team Focus  Medical   cervical fx/stenosis s/p decomrpession and fusion, required re-do d/t loosening of hardware. pain better. has had fever  improve functional use of UE's  CV, bowel and bladder mgt, pain control, fever work up   Bowel/Bladder   Pt. is incontinent at times of both B/B. LBM 02/18/2019.  Maintain regular pattern. Encourage timed toileting.  Assist with toileting needs PRN.   Swallow/Nutrition/ Hydration             ADL's   total-total A +2 overall  Max A overall  Basic sliding board transfers, sitting balance, UE AAROM in prep for ADL tasks, AE training, education   Mobility   max A rolling, max A to total A supine to/from sit, total A x 2 slide board transfer  max A overall  sitting balance/tolerance, transfers   Communication             Safety/Cognition/ Behavioral  Observations            Pain   Pt complains of pain of 6 to posterior neck area. Pain managed with Tylenol.  Pain < 3  Assess pain Q shift and PRN.   Skin   No skin issues.   Maintain skin integrity and prevent skin breakdown.  Assess skin Q shift and PRN.    Rehab Goals Patient on target to meet rehab goals: Yes *See Care Plan and progress notes for long and short-term goals.     Barriers to Discharge  Current Status/Progress Possible Resolutions Date Resolved   Physician    Medical stability        see medical progress note      Nursing  Incontinence;Medication compliance;Behavior               PT  Decreased caregiver support;Home environment access/layout  pt will require 24/7 assist upon d/c that family cannot provide              OT Medical stability;Incontinence;Neurogenic Bowel & Bladder;Insurance for SNF coverage                SLP                SW                Discharge Planning/Teaching Needs:  D/C plan at time of CIR admission was SNF - team aware.  NA  Team Discussion:  Central cord injury; monitoring temp which has been variable.  BM with supp this a.m.  Mod - max for bed mobility and max +2 to total with other activities. SW notes plan is for SNF after CIR.  MD/ team feel she will benefit from ~ 2 weeks on CIR prior to transition.  Revisions to Treatment Plan:  NA    Continued Need for Acute Rehabilitation Level of Care: The patient requires daily medical management by a physician with specialized training in physical medicine and rehabilitation for the following conditions: Daily direction of a multidisciplinary physical rehabilitation program to ensure safe treatment while eliciting the highest outcome that is of practical value to the patient.: Yes Daily medical management of patient stability for increased activity during participation in an intensive rehabilitation regime.: Yes Daily analysis of laboratory values and/or radiology reports with any  subsequent need for medication adjustment of medical intervention for : Post surgical problems;Neurological problems;Wound care problems   I attest that I was present, lead the team conference, and concur with the assessment and plan of the team.   Amada Jupiter 02/19/2019, 9:02 AM    Team conference was held via web/ teleconference due to COVID - 19

## 2019-02-19 ENCOUNTER — Inpatient Hospital Stay (HOSPITAL_COMMUNITY): Payer: PRIVATE HEALTH INSURANCE

## 2019-02-19 ENCOUNTER — Inpatient Hospital Stay (HOSPITAL_COMMUNITY): Payer: PRIVATE HEALTH INSURANCE | Admitting: Occupational Therapy

## 2019-02-19 ENCOUNTER — Inpatient Hospital Stay (HOSPITAL_COMMUNITY): Payer: PRIVATE HEALTH INSURANCE | Admitting: Physical Therapy

## 2019-02-19 NOTE — Progress Notes (Signed)
Occupational Therapy Session Note  Patient Details  Name: Denise Espinoza MRN: [id number] Date of Birth: Dec 28, 1929  Today's Date: 02/19/2019 OT Individual Time: 1330-1430 OT Individual Time Calculation (min): 60 min    Short Term Goals: Week 1:  OT Short Term Goal 1 (Week 1): Pt will bathe 1 body area using AE as needed  OT Short Term Goal 2 (Week 1): Pt will maintain static sitting balance for 1 minute with Mod A during self care to improve trunk control OT Short Term Goal 3 (Week 1): Pt will complete 1 grooming task w/c level at sink using AE as needed  Skilled Therapeutic Interventions/Progress Updates:    Pt seen forOTsession focusing on core actiation,upright posture and UE ROM . Pt sitting up in tilt-in-space w/c upon arrival, agreeable to tx session and denying pain. Pt taken to therapy gym total A in w/c. While waiting for +2 assist, completed anterior/posterior weight shifts from upright w/c, pt able to bring self off back of w/c with supervision and maintain static unsupported sitting balance for several seconds before control return to back support. Completed x5 with VCs for core activation and leading with head.  Completed UE AAROM. Pt demonstrates full ROM bicep/tricep against gravity with min A for positioning and preventing flexor synergy pattern. PROM/AAROM for scapular retraction on R/L.  When +2 arrived, completed +2 sliding board transfer to therapy mat. Addressed static sitting balance EOM, with VCs for anterior weight shift, pt able to maintain balance with guarding assist. Pt without awareness of R/L LOB, however, able to self correct with cuing.  Positioned with shoulders supported on therapist's knees behind her to facilitate chest expansion and pec stretch. Pt voiced increased comfort with positioning.  She completed +2 sliding board transfer back to w/c and then taken back to room and sliding board transfer back to bed. Total A to return to supine. Pt requesting to be  placed on bedpan. Pt left positioned on bed pan with all needs in reach, RN and NT made aware of pt's position.   Therapy Documentation Precautions:  Precautions Precautions: Fall, Cervical Precaution Comments: Ok to doff brace in bed, when ambulating to bathroom, and while showering. C-collar applied in sitting position  Required Braces or Orthoses: Cervical Brace Cervical Brace: Hard collar Restrictions Weight Bearing Restrictions: No   Therapy/Group: Individual Therapy  Syleena Mchan L 02/19/2019, 7:02 AM

## 2019-02-19 NOTE — Progress Notes (Signed)
Physical Therapy Session Note  Patient Details  Name: Denise Espinoza MRN: [id number] Date of Birth: [birth year]  Today's Date: 02/19/2019 PT Individual Time: 0354-6568 PT Individual Time Calculation (min): 45 min   Short Term Goals: Week 1:  PT Short Term Goal 1 (Week 1): Pt will perform bed mobility with max assist +2 PT Short Term Goal 2 (Week 1): Pt will perform bed<>chair transfer with max assist +2 PT Short Term Goal 3 (Week 1): Pt will perform sit<>stand with max assist +2 PT Short Term Goal 4 (Week 1): Pt will initiate w/c propulsion using B LEs      Skilled Therapeutic Interventions/Progress Updates:   Pt resting in bed.  She stated that she was uncomfortabel all night long, due to her head not being positioned for comfort.  PT educated pt on being pro-active about asking for exactly what she needs to be comfortable.  With HOB at 26 degrees, exactly, pt was most comfortabel.  PT posted a sign over her bed to this effect.  Pt rated pain 6/10 entire top of head.   Supine Therapeutic exercise performed with LE to increase strength for functional mobility. 10 x 1 alternating ankle pumps, bil hip internal rotation, R,L (active assist) straight leg raises.  Pt reported that she needed to urinate; +2 for rolling L for placement of bedpan.  Pt initially wihtout results, but she spontaneously began pressing on her lower abdomen to void, with results.  +2 for removal of bedpan.  Pt left resting in bed with alarm set and Mark, NT in attendance to start breakfast with pt.     Therapy Documentation Precautions:  Precautions Precautions: Fall, Cervical Precaution Comments: Ok to doff brace in bed, when ambulating to bathroom, and while showering. C-collar applied in sitting position  Required Braces or Orthoses: Cervical Brace Cervical Brace: Hard collar Restrictions Weight Bearing Restrictions: No         Therapy/Group: Individual Therapy  Fanta Wimberley 02/19/2019, 10:28 AM

## 2019-02-19 NOTE — Progress Notes (Signed)
[facility] PHYSICAL MEDICINE & REHABILITATION PROGRESS NOTE   Subjective/Complaints: Had some anxiety last night surrounding some issues with staff. Worried that her hands are not improving and that she has weakness and numbness elsewhere  ROS: Patient denies fever, rash, sore throat, blurred vision, nausea, vomiting, diarrhea, cough, shortness of breath or chest pain,   headache, or mood change.   Objective:   Vas [place] Lower Extremity Venous (dvt)  Result Date: 02/18/2019  Lower Venous Study Indications: Pain.  Performing Technologist: Jeb Levering RDMS, RVT  Examination Guidelines: A complete evaluation includes B-mode imaging, spectral Doppler, color Doppler, and power Doppler as needed of all accessible portions of each vessel. Bilateral testing is considered an integral part of a complete examination. Limited examinations for reoccurring indications may be performed as noted.  +---------+---------------+---------+-----------+----------+-------+ RIGHT    CompressibilityPhasicitySpontaneityPropertiesSummary +---------+---------------+---------+-----------+----------+-------+ CFV      Full           Yes      Yes                          +---------+---------------+---------+-----------+----------+-------+ SFJ      Full                                                 +---------+---------------+---------+-----------+----------+-------+ FV Prox  Full                                                 +---------+---------------+---------+-----------+----------+-------+ FV Mid   Full                                                 +---------+---------------+---------+-----------+----------+-------+ FV DistalFull                                                 +---------+---------------+---------+-----------+----------+-------+ PFV      Full                                                 +---------+---------------+---------+-----------+----------+-------+ POP       Full           Yes      Yes                          +---------+---------------+---------+-----------+----------+-------+ PTV      Full                                                 +---------+---------------+---------+-----------+----------+-------+ PERO     Full                                                 +---------+---------------+---------+-----------+----------+-------+   +---------+---------------+---------+-----------+----------+-------+  LEFT     CompressibilityPhasicitySpontaneityPropertiesSummary +---------+---------------+---------+-----------+----------+-------+ CFV      Full           Yes      Yes                          +---------+---------------+---------+-----------+----------+-------+ SFJ      Full                                                 +---------+---------------+---------+-----------+----------+-------+ FV Prox  Full                                                 +---------+---------------+---------+-----------+----------+-------+ FV Mid   Full                                                 +---------+---------------+---------+-----------+----------+-------+ FV DistalFull                                                 +---------+---------------+---------+-----------+----------+-------+ PFV      Full                                                 +---------+---------------+---------+-----------+----------+-------+ POP      Full           Yes      Yes                          +---------+---------------+---------+-----------+----------+-------+ PTV      Full                                                 +---------+---------------+---------+-----------+----------+-------+ PERO     Full                                                 +---------+---------------+---------+-----------+----------+-------+     Summary: Right: There is no evidence of deep vein thrombosis in the lower extremity. A  cystic structure is found in the popliteal fossa. Left: There is no evidence of deep vein thrombosis in the lower extremity. No cystic structure found in the popliteal fossa.  *See table(s) above for measurements and observations. Electronically signed by Fabienne Brunsharles Fields MD on 02/18/2019 at 9:42:42 AM.    Final    Recent Labs    02/16/19 1657  WBC 8.2  HGB 11.1*  HCT 33.4*  PLT 267   Recent Labs    02/16/19 1657  NA 134*  K 3.6  CL 98  CO2 23  GLUCOSE 215*  BUN 14  CREATININE 0.54  CALCIUM 8.6*    Intake/Output Summary (Last 24 hours) at 02/19/2019 0844 Last data filed at 02/18/2019 1900 Gross per 24 hour  Intake 680 ml  Output -  Net 680 ml     Physical Exam: Vital Signs Blood pressure 117/71, pulse 76, temperature 99 F (37.2 C), temperature source Oral, resp. rate 18, height 5' 2.99" (1.6 m), weight 62 kg, SpO2 99 %. Constitutional: No distress . Vital signs reviewed. HEENT: EOMI, oral membranes moist Neck: supple Cardiovascular: RRR without murmur. No JVD    Respiratory: CTA Bilaterally without wheezes or rales. Normal effort    GI: BS +, non-tender, non-distended  Musculoskeletal:  General: head forward position with C-collar Neurological: more alert. Voice stronger.  RUE 1+ prox to distal. LUE 2 to 2+/5 prox to distal. RLE 3/5 prox to distal. LLE 3-4/5 prox to distal. Decreased LT and pain throughout although senses gross touch and pain below shoulders bilaterally.---no changes today.   DTR's 2+ UE and 3+ LE's.  Skin: Skin iswarmand dry. Psych: anxious   Assessment/Plan: 1. Functional deficits secondary to cervical myelopathy/central cord which require 3+ hours per day of interdisciplinary therapy in a comprehensive inpatient rehab setting.  Physiatrist is providing close team supervision and 24 hour management of active medical problems listed below.  Physiatrist and rehab team continue to assess barriers to discharge/monitor patient progress toward  functional and medical goals  Care Tool:  Bathing        Body parts bathed by helper: Right arm, Left arm, Chest, Abdomen, Front perineal area, Buttocks, Right upper leg, Left upper leg, Right lower leg, Left lower leg, Face     Bathing assist Assist Level: 2 Helpers     Upper Body Dressing/Undressing Upper body dressing   What is the patient wearing?: Pull over shirt    Upper body assist Assist Level: Total Assistance - Patient < 25%    Lower Body Dressing/Undressing Lower body dressing      What is the patient wearing?: Underwear/pull up, Pants     Lower body assist Assist for lower body dressing: 2 Helpers     Toileting Toileting    Toileting assist Assist for toileting: 2 Helpers     Transfers Chair/bed transfer  Transfers assist     Chair/bed transfer assist level: 2 Helpers     Locomotion Ambulation   Ambulation assist   Ambulation activity did not occur: Safety/medical concerns          Walk 10 feet activity   Assist  Walk 10 feet activity did not occur: Safety/medical concerns        Walk 50 feet activity   Assist Walk 50 feet with 2 turns activity did not occur: Safety/medical concerns         Walk 150 feet activity   Assist Walk 150 feet activity did not occur: Safety/medical concerns         Walk 10 feet on uneven surface  activity   Assist Walk 10 feet on uneven surfaces activity did not occur: Safety/medical concerns         Wheelchair     Assist Will patient use wheelchair at discharge?: Yes Type of Wheelchair: Manual    Wheelchair assist level: Dependent - Patient 0% Max wheelchair distance: 150 ft    Wheelchair 50 feet with 2 turns activity    Assist        Assist Level: Dependent - Patient 0%  Wheelchair 150 feet activity     Assist     Assist Level: Dependent - Patient 0%    Medical Problem List and Plan: 1.Functional and mobility deficitssecondary to C2 fracture and  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 Pt is s/p C1 laminectomy, C2 ORIF and Occiput to C4 PLIF Patient is beginning CIR therapies today including PT and OT  -bilateral WHO's   -discussed prognosis with pt today and prolonged rehab course ahead 2. Antithrombotics: -DVT/anticoagulation:Mechanical:Sequential compression devices, below kneeBilateral lower extremities. Dopplers pending. -antiplatelet therapy: N/A 3. Pain Management:Oxycodone prn--premedicate prior to therapy. Gabapentin tid for neuropathy 4. Mood:Team to provide ego support. LCSW to follow for evaluation and support. -antipsychotic agents: N/A 5. Neuropsych: This patientiscapable of making decisions on herown behalf. 6. Skin/Wound Care:Will order air matress overlay for pressure relief. 7. Fluids/Electrolytes/Nutrition:Needs assistance with meals. Monitor I/O. Check lytes Monday 8. H/o ZOX:[id number]:Continue metoprolol daily 9. Neurogenic bowel:   -continue daily bowel program  -no bm today 10. Neurogenic bladder:Has been incontinent for years.  monitor   -have requested timed toileting but this may be difficult given her mobility status 11. Fever: still with low grade temp  -no cough, diarrhea, suspicious skin areas  -cbc, bmet, cxr and ua negative, ucx negative, surgical site remains CDI  -continue to monitor closely  - check bc x 2  For temp greater than 101   -dopplers negative  -encourage IS, OOB  LOS: 4 days A FACE TO FACE EVALUATION WAS PERFORMED  Ranelle OysterZachary T Beldon Nowling 02/19/2019, 8:44 AM

## 2019-02-19 NOTE — Progress Notes (Signed)
Physical Therapy Session Note  Patient Details  Name: Denise Espinoza MRN: [id number] Date of Birth: 1930-09-29  Today's Date: 02/19/2019 PT Individual Time: 0915-1030 PT Individual Time Calculation (min): 75 min   Short Term Goals: Week 1:  PT Short Term Goal 1 (Week 1): Pt will perform bed mobility with max assist +2 PT Short Term Goal 2 (Week 1): Pt will perform bed<>chair transfer with max assist +2 PT Short Term Goal 3 (Week 1): Pt will perform sit<>stand with max assist +2 PT Short Term Goal 4 (Week 1): Pt will initiate w/c propulsion using B LEs  Skilled Therapeutic Interventions/Progress Updates:    Pt received seated in bed, agreeable to PT session. No complaints of pain. Pt is max A to don pants in bed with use of bridging to pull up over buttocks. Supine to sit with mod A via log-roll technique. Sit to stand with assist x 2 in 3 muskateers position. Pt is able to take a few steps forward/back with assist for lateral weight shift and to advance LE, flexed trunk, neck, and knees with onset of fatigue. Slide board transfer bed to w/c with total A x 2. Sit to stand with max A x 2 to stedy. Stedy transfer w/c to mat table. Sit to stand x 3 reps to stedy with max to total A x 2, inconsistent ability to fully stand to stedy with increase in flexed body position with onset of fatigue. Pt also reports onset of nausea with standing activity, improves with seated rest break. Seated balance EOM with min to mod A while lifting alt UE to targets and attempting to maintain sitting balance. Pt leans posteriorly and to the L with any attempt to move UE, requires multimodal cueing and min to mod A to return to midline. Stedy transfer back to TIS w/c at end of session. Pt left reclined in TIS w/c in room with needs in reach at end of session. Education with patient about importance of spending time out of bed in her w/c, pt agreeable to stay seated in chair for at least 30 min.  Therapy  Documentation Precautions:  Precautions Precautions: Fall, Cervical Precaution Comments: Ok to doff brace in bed, when ambulating to bathroom, and while showering. C-collar applied in sitting position  Required Braces or Orthoses: Cervical Brace Cervical Brace: Hard collar Restrictions Weight Bearing Restrictions: No    Therapy/Group: Individual Therapy   Peter Congo, PT, DPT  02/19/2019, 12:38 PM

## 2019-02-20 ENCOUNTER — Inpatient Hospital Stay (HOSPITAL_COMMUNITY): Payer: PRIVATE HEALTH INSURANCE | Admitting: Occupational Therapy

## 2019-02-20 ENCOUNTER — Inpatient Hospital Stay (HOSPITAL_COMMUNITY): Payer: PRIVATE HEALTH INSURANCE | Admitting: Physical Therapy

## 2019-02-20 NOTE — Progress Notes (Signed)
Social Work Patient ID: Denise Espinoza, female   DOB: January 22, 1930, 85 y.o.   MRN: [id number]   Have reviewed team conference with pt and with her daughter, Elita Quick.  Both aware that team feels LOS target should be ~ 2 weeks for CIR and then ready for SNF transition.  Both admit disappointment with overall situation but both "trying to be realistic."  Discussed with both the need to continue to make plans for longer term care plan; whether pt would remain in SNF (would need to screen for Medicaid eligibility) or if pt would return home (should anticipate to still need 24/7 caregiver and probable home modifications.)  Will refer pt for neuropsychology for additional emotional support.  Darlisa Spruiell, LCSW

## 2019-02-20 NOTE — Progress Notes (Signed)
[facility] PHYSICAL MEDICINE & REHABILITATION PROGRESS NOTE   Subjective/Complaints: Having BM, flatus, improved control and sense of urination. Hands feel "sticky"  ROS: Patient denies fever, rash, sore throat, blurred vision, nausea, vomiting, diarrhea, cough, shortness of breath or chest pain,  headache, or mood change.   Objective:   No results found. No results for input(s): WBC, HGB, HCT, PLT in the last 72 hours. No results for input(s): NA, K, CL, CO2, GLUCOSE, BUN, CREATININE, CALCIUM in the last 72 hours.  Intake/Output Summary (Last 24 hours) at 02/20/2019 0848 Last data filed at 02/20/2019 0844 Gross per 24 hour  Intake 780 ml  Output -  Net 780 ml     Physical Exam: Vital Signs Blood pressure (!) 120/58, pulse 70, temperature 98.3 F (36.8 C), temperature source Oral, resp. rate 15, height 5' 2.99" (1.6 m), weight 62.1 kg, SpO2 96 %. Constitutional: No distress . Vital signs reviewed. HEENT: EOMI, oral membranes moist Neck: supple Cardiovascular: RRR without murmur. No JVD    Respiratory: CTA Bilaterally without wheezes or rales. Normal effort    GI: BS +, non-tender, non-distended   Musculoskeletal:  General: C-collar Neurological: more alert. Voice stronger.  RUE 1+ to 2- prox to distal. LUE 2+ to 3-/5 prox to distal. RLE 3/5 prox to distal. LLE 3-4/5 prox to distal. Decreased LT and pain throughout although senses gross touch and pain below shoulders bilaterally.---no changes today.   DTR's 2+ UE and 3+ LE's.  Skin: Skin iswarmand dry. Psych: in good spirits   Assessment/Plan: 1. Functional deficits secondary to cervical myelopathy/central cord which require 3+ hours per day of interdisciplinary therapy in a comprehensive inpatient rehab setting.  Physiatrist is providing close team supervision and 24 hour management of active medical problems listed below.  Physiatrist and rehab team continue to assess barriers to discharge/monitor patient  progress toward functional and medical goals  Care Tool:  Bathing        Body parts bathed by helper: Right arm, Left arm, Chest, Abdomen, Front perineal area, Buttocks, Right upper leg, Left upper leg, Right lower leg, Left lower leg, Face     Bathing assist Assist Level: 2 Helpers     Upper Body Dressing/Undressing Upper body dressing   What is the patient wearing?: Pull over shirt    Upper body assist Assist Level: Total Assistance - Patient < 25%    Lower Body Dressing/Undressing Lower body dressing      What is the patient wearing?: Underwear/pull up, Pants     Lower body assist Assist for lower body dressing: 2 Helpers     Toileting Toileting    Toileting assist Assist for toileting: 2 Helpers     Transfers Chair/bed transfer  Transfers assist     Chair/bed transfer assist level: 2 Helpers     Locomotion Ambulation   Ambulation assist   Ambulation activity did not occur: Safety/medical concerns          Walk 10 feet activity   Assist  Walk 10 feet activity did not occur: Safety/medical concerns        Walk 50 feet activity   Assist Walk 50 feet with 2 turns activity did not occur: Safety/medical concerns         Walk 150 feet activity   Assist Walk 150 feet activity did not occur: Safety/medical concerns         Walk 10 feet on uneven surface  activity   Assist Walk 10 feet on uneven surfaces activity  did not occur: Safety/medical concerns         Wheelchair     Assist Will patient use wheelchair at discharge?: Yes Type of Wheelchair: Manual    Wheelchair assist level: Dependent - Patient 0% Max wheelchair distance: 150 ft    Wheelchair 50 feet with 2 turns activity    Assist        Assist Level: Dependent - Patient 0%   Wheelchair 150 feet activity     Assist     Assist Level: Dependent - Patient 0%    Medical Problem List and Plan: 1.Functional and mobility deficitssecondary to  C2 fracture and 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 Pt is s/p C1 laminectomy, C2 ORIF and Occiput to C4 PLIF Patient is beginning CIR therapies today including PT and OT  -bilateral WHO's   -continue to provide education 2. Antithrombotics: -DVT/anticoagulation:Mechanical:Sequential compression devices, below kneeBilateral lower extremities. Dopplers pending. -antiplatelet therapy: N/A 3. Pain Management:Oxycodone prn--premedicate prior to therapy. Gabapentin tid for neuropathy 4. Mood:Team to provide ego support. LCSW to follow for evaluation and support. -antipsychotic agents: N/A 5. Neuropsych: This patientiscapable of making decisions on herown behalf. 6. Skin/Wound Care:Will order air matress overlay for pressure relief. 7. Fluids/Electrolytes/Nutrition:encourage PO 8. H/o DGU:[id number] metoprolol daily 9. Neurogenic bowel:   -gaining continence  -had bm yesterday 10. Neurogenic bladder:Has been incontinent for years.  monitor   -time toilet attempts. Patient gaining bladder sensation 11. Fever: afebrile over the last 24+  -no cough, diarrhea, suspicious skin areas  -cbc, bmet, cxr and ua negative, ucx negative, surgical site remains CDI  -continue to monitor closely  - check bc x 2  For temp greater than 101   -dopplers negative  -encourage IS, OOB  LOS: 5 days A FACE TO FACE EVALUATION WAS PERFORMED  Ranelle Oyster 02/20/2019, 8:48 AM

## 2019-02-20 NOTE — Progress Notes (Signed)
Occupational Therapy Session Note  Patient Details  Name: Denise Espinoza MRN: [id number] Date of Birth: [birth year]  Today's Date: 02/20/2019 OT Individual Time: 1130-1230 OT Individual Time Calculation (min): 60 min    Short Term Goals: Week 1:  OT Short Term Goal 1 (Week 1): Pt will bathe 1 body area using AE as needed  OT Short Term Goal 2 (Week 1): Pt will maintain static sitting balance for 1 minute with Mod A during self care to improve trunk control OT Short Term Goal 3 (Week 1): Pt will complete 1 grooming task w/c level at sink using AE as needed  Skilled Therapeutic Interventions/Progress Updates:    Pt seen for OT session focusing on ADL re-training with emphasis on functional use of UEs. Pt up in tilt-in-space w/c upon arrival, denied pain and agreeable to tx session. She was on phone with family member discussing her need to have signature for personal checks and afraid she wouldn't be able to. Pt agreeable to working on hand witting.  At high/low table, pt positioned with B UEs placed on table. With dominant R hand, she was able to legibly write first and last name using thicker marker for ease of grasp with min A overall for maintaining grasp and positioning of marker. Pt demonstrates functional grip strength, though weak, however, significantly impaired sensation limiting her ability to feel writing implement or utensil in her hand.  Trialed self feeding of pudding cup first trialing red built up gripper on spoon, however, same issues with grasp as noted above. Used universal hand cuff on L hand to hold spoon with success. Pt able to scoop pudding onto spoon and bring ~75% of way to mouth, however, requires assist to lift it entirly up to mouth despite various positioning modifications made. Pt does fatigue quickly and not at functional level for entire meal at this time.  Provided with weighted large cup/lid as well and able to position pt in chair with hospital over bed table in such  way for pt to be able to reach cup to drink without need for assist. She was taken back to room at end of session, and positioned with table and cup in accessible position as well as soft touch call bell and all needs met.  Therapy Documentation Precautions:  Precautions Precautions: Fall, Cervical Precaution Comments: Ok to doff brace in bed, when ambulating to bathroom, and while showering. C-collar applied in sitting position  Required Braces or Orthoses: Cervical Brace Cervical Brace: Hard collar Restrictions Weight Bearing Restrictions: No   Therapy/Group: Individual Therapy  Graham Doukas L 02/20/2019, 6:59 AM

## 2019-02-20 NOTE — Progress Notes (Signed)
Physical Therapy Session Note  Patient Details  Name: Denise Espinoza MRN: [id number] Date of Birth: Aug 14, 1930  Today's Date: 02/20/2019 PT Individual Time: 1015-1100 PT Individual Time Calculation (min): 45 min  PT Missed Time: 15 min Missed Time Reason: Nursing care  Short Term Goals: Week 1:  PT Short Term Goal 1 (Week 1): Pt will perform bed mobility with max assist +2 PT Short Term Goal 2 (Week 1): Pt will perform bed<>chair transfer with max assist +2 PT Short Term Goal 3 (Week 1): Pt will perform sit<>stand with max assist +2 PT Short Term Goal 4 (Week 1): Pt will initiate w/c propulsion using B LEs  Skilled Therapeutic Interventions/Progress Updates:    Pt received supine in bed receiving dependent pericare from RN and NT due to incontinence. Pt missed 15 min of scheduled therapy session due to RN care. Pt is dependent to don TED hose and max A to don pants while supine in bed. Pt is able to bridge up for pants to be pulled up over her bottom. Supine to sit with max A. Pt requires max multimodal cueing for scooting hips closer to EOB, becomes confused during scooting and believes she is attempting to transfer to the w/c despite being given instructions and cues for sequencing of task. Slide board transfer bed to w/c with max A x 1, min A x 1. Pt engages in core activity sitting upright in chair with focus on keeping head upright while therapist brushes her hair and pulls it back out of her face. Seated BUE stretching in available planes and AAROM supination/pronation and finger flexion/extension while seated in w/c. Pt left reclined in TIS w/c in room with needs in reach at end of session.  Therapy Documentation Precautions:  Precautions Precautions: Fall, Cervical Precaution Comments: Ok to doff brace in bed, when ambulating to bathroom, and while showering. C-collar applied in sitting position  Required Braces or Orthoses: Cervical Brace Cervical Brace: Hard  collar Restrictions Weight Bearing Restrictions: No    Therapy/Group: Individual Therapy   Peter Congo, PT, DPT  02/20/2019, 3:52 PM

## 2019-02-20 NOTE — Progress Notes (Signed)
Occupational Therapy Session Note  Patient Details  Name: Denise Espinoza MRN: [id number] Date of Birth: October 03, 1930  Today's Date: 02/20/2019 OT Individual Time: 1405-1503 OT Individual Time Calculation (min): 58 min    Short Term Goals: Week 1:  OT Short Term Goal 1 (Week 1): Pt will bathe 1 body area using AE as needed  OT Short Term Goal 2 (Week 1): Pt will maintain static sitting balance for 1 minute with Mod A during self care to improve trunk control OT Short Term Goal 3 (Week 1): Pt will complete 1 grooming task w/c level at sink using AE as needed  Skilled Therapeutic Interventions/Progress Updates:    Patient seated in TIS w/c and ready for therapy session.  She notes discomfort in current position - moved TIS into greater reclined position and this relieved pain - encouraged patient to request this change of position in future.  Completed SB transfer to/from w/c and mat with max A of one and stand by of 2nd person.  Patient able to tolerate 25 minutes of trunk/posture activities with focus on head and shoulder positioning, deep breathing techniques.  UB AAROM completed bilateral UEs at w/c level.  Patient returned to bed at close of session - SB transfer max A of one with stand by of 2nd - max A for SSP to supine.  Bed alarm set, call bell and tv control in reach.    Therapy Documentation Precautions:  Precautions Precautions: Fall, Cervical Precaution Comments: Ok to doff brace in bed, when ambulating to bathroom, and while showering. C-collar applied in sitting position  Required Braces or Orthoses: Cervical Brace Cervical Brace: Hard collar Restrictions Weight Bearing Restrictions: No General:   Vital Signs: Therapy Vitals Temp: 98.9 F (37.2 C) Temp Source: Oral Pulse Rate: 78 BP: (!) 107/58 Patient Position (if appropriate): Sitting Oxygen Therapy SpO2: 99 % O2 Device: Room Air Pain: Pain Assessment Pain Scale: 0-10 Pain Score: 4  Pain Location: Head Pain  Orientation: Anterior Pain Descriptors / Indicators: Aching Pain Intervention(s): Repositioned;Rest   Other Treatments:     Therapy/Group: Individual Therapy  Barrie Lyme 02/20/2019, 3:34 PM

## 2019-02-21 ENCOUNTER — Inpatient Hospital Stay (HOSPITAL_COMMUNITY): Payer: PRIVATE HEALTH INSURANCE | Admitting: Occupational Therapy

## 2019-02-21 ENCOUNTER — Inpatient Hospital Stay (HOSPITAL_COMMUNITY): Payer: PRIVATE HEALTH INSURANCE | Admitting: Physical Therapy

## 2019-02-21 NOTE — Progress Notes (Signed)
[place] PHYSICAL MEDICINE & REHABILITATION PROGRESS NOTE   Subjective/Complaints: Gaining control of bowels and bladder! Still frustrated with hands. Pain a little better today. Working on breakfast with therapy  ROS: Patient denies fever, rash, sore throat, blurred vision, nausea, vomiting, diarrhea, cough, shortness of breath or chest pain,  headache, or mood change.   Objective:   No results found. No results for input(s): WBC, HGB, HCT, PLT in the last 72 hours. No results for input(s): NA, K, CL, CO2, GLUCOSE, BUN, CREATININE, CALCIUM in the last 72 hours.  Intake/Output Summary (Last 24 hours) at 02/21/2019 1038 Last data filed at 02/21/2019 0900 Gross per 24 hour  Intake 565 ml  Output -  Net 565 ml     Physical Exam: Vital Signs Blood pressure 123/74, pulse 78, temperature 99 F (37.2 C), temperature source Oral, resp. rate 15, height 5' 2.99" (1.6 m), weight 64.2 kg, SpO2 97 %. Constitutional: No distress . Vital signs reviewed. HEENT: EOMI, oral membranes moist Neck: supple Cardiovascular: RRR without murmur. No JVD    Respiratory: CTA Bilaterally without wheezes or rales. Normal effort    GI: BS +, non-tender, non-distended  Musculoskeletal:  General: C-collar Neurological: more alert. Voice improving.  RUE 1+ to 2- prox to distal. LUE 2+ to 3-/5 prox to distal--using tenodesis splint to assist with feeding. RLE 3+/5 prox to distal. LLE  4/5 prox to distal. Decreased LT and pain throughout although senses gross touch and pain below shoulders bilaterally.---no changes today.   DTR's 2+ UE and 3+ LE's.  Skin: Skin iswarmand dry. Psych: mood more up beat   Assessment/Plan: 1. Functional deficits secondary to cervical myelopathy/central cord which require 3+ hours per day of interdisciplinary therapy in a comprehensive inpatient rehab setting.  Physiatrist is providing close team supervision and 24 hour management of active medical problems listed  below.  Physiatrist and rehab team continue to assess barriers to discharge/monitor patient progress toward functional and medical goals  Care Tool:  Bathing        Body parts bathed by helper: Right arm, Left arm, Chest, Abdomen, Front perineal area, Buttocks, Right upper leg, Left upper leg, Right lower leg, Left lower leg, Face     Bathing assist Assist Level: 2 Helpers     Upper Body Dressing/Undressing Upper body dressing   What is the patient wearing?: Pull over shirt    Upper body assist Assist Level: Total Assistance - Patient < 25%    Lower Body Dressing/Undressing Lower body dressing      What is the patient wearing?: Underwear/pull up, Pants     Lower body assist Assist for lower body dressing: 2 Helpers     Toileting Toileting    Toileting assist Assist for toileting: 2 Helpers     Transfers Chair/bed transfer  Transfers assist     Chair/bed transfer assist level: 2 Helpers     Locomotion Ambulation   Ambulation assist   Ambulation activity did not occur: Safety/medical concerns          Walk 10 feet activity   Assist  Walk 10 feet activity did not occur: Safety/medical concerns        Walk 50 feet activity   Assist Walk 50 feet with 2 turns activity did not occur: Safety/medical concerns         Walk 150 feet activity   Assist Walk 150 feet activity did not occur: Safety/medical concerns         Walk 10 feet on  uneven surface  activity   Assist Walk 10 feet on uneven surfaces activity did not occur: Safety/medical concerns         Wheelchair     Assist Will patient use wheelchair at discharge?: Yes Type of Wheelchair: Manual    Wheelchair assist level: Dependent - Patient 0% Max wheelchair distance: 150 ft    Wheelchair 50 feet with 2 turns activity    Assist        Assist Level: Dependent - Patient 0%   Wheelchair 150 feet activity     Assist     Assist Level: Dependent -  Patient 0%    Medical Problem List and Plan: 1.Functional and mobility deficitssecondary to C2 fracture and 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 Pt is s/p C1 laminectomy, C2 ORIF and Occiput to C4 PLIF Patient is beginning CIR therapies today including PT and OT  -bilateral WHO's   -continue to provide education and positive reinforcement 2. Antithrombotics: -DVT/anticoagulation:Mechanical:Sequential compression devices, below kneeBilateral lower extremities. Dopplers pending. -antiplatelet therapy: N/A 3. Pain Management:Oxycodone prn--premedicate prior to therapy. Gabapentin tid for neuropathy 4. Mood:Team to provide ego support. LCSW to follow for evaluation and support. -antipsychotic agents: N/A 5. Neuropsych: This patientiscapable of making decisions on herown behalf. 6. Skin/Wound Care:Will order air matress overlay for pressure relief. 7. Fluids/Electrolytes/Nutrition:encourage PO 8. H/o ENM:[id number] metoprolol daily 9. Neurogenic bowel:   -improving continence 10. Neurogenic bladder:Has been incontinent for years.  monitor   -time toileting, improved continence 11. Fever: afebrile but has had a few low grade temps recently  -no cough, diarrhea, suspicious skin areas  -cbc, bmet, cxr and ua negative, ucx negative, surgical site remains CDI  -continue to monitor closely  - check bc x 2 if  temp greater than 101   -dopplers negative  -encourage IS, OOB as we've been doing  -recheck CBC monday  LOS: 6 days A FACE TO FACE EVALUATION WAS PERFORMED  Ranelle Oyster 02/21/2019, 10:38 AM

## 2019-02-21 NOTE — Progress Notes (Addendum)
Occupational Therapy Weekly Progress Note  Patient Details  Name: Denise Espinoza MRN: [id number] Date of Birth: 1929/12/26  Beginning of progress report period: Feb 14, 2019 End of progress report period: Feb 21, 2019  Today's Date: 02/21/2019 OT Individual Time: 1000-1115 OT Individual Time Calculation (min): 75 min    Patient has met 1 of 3 short term goals. Pt requires total- hand over hand- total  A for grooming and bathing tasks.  Pt making slow but steady progress towards OT goals. She cont to be limited by quadriparesis UE>LE. She requires +2 max-total A overall for functional mobility and ADLs. She has improving UE ROM against gravity, however, not functional at this time 2/2 decreased functional activity tolerance and impaired sensation limiting functional use of hands. Trialing AE for increased independence with self-feeding and ADL tasks.   Patient continues to demonstrate the following deficits:acute pain, cognitive deficits, muscle weakness (generalized) and quadriparesis and therefore will continue to benefit from skilled OT intervention to enhance overall performance with BADL and Reduce care partner burden.  Patient progressing toward long term goals..  Plan of care revisions: dynamic sitting balance , standing balance, sit>stand,  toileting and toilet transfer goal added. Goals modified to mod-max overall See POC for goal details. .  OT Short Term Goals Week 1:  OT Short Term Goal 1 (Week 1): Pt will bathe 1 body area using AE as needed  OT Short Term Goal 1 - Progress (Week 1): Not met OT Short Term Goal 2 (Week 1): Pt will maintain static sitting balance for 1 minute with Mod A during self care to improve trunk control OT Short Term Goal 2 - Progress (Week 1): Met OT Short Term Goal 3 (Week 1): Pt will complete 1 grooming task w/c level at sink using AE as needed OT Short Term Goal 3 - Progress (Week 1): Not met Week 2:  OT Short Term Goal 1 (Week 2): Pt will consistently  complete transfers with mod A +1 OT Short Term Goal 2 (Week 2): Pt will thread B UEs into shirt with min A OT Short Term Goal 3 (Week 2): Pt will complete toileting task with total A +1 in order to decrease burden of care OT Short Term Goal 4 (Week 2): Pt will tolerate 5 minutes of unsupported sitting with supervision in order to increase postural control/stability.  OT Short Term Goal 5 (Week 2): Pt will stand for 2 minutes with min A for balance in prep for ADL task.  Skilled Therapeutic Interventions/Progress Updates:    Pt seen for OT session fucisng on functional sitting balance and core activation in prep for ADL tasks. Pt up in tilt-in-space w/c upon arrival, voicing discomfort in chair. Reclined chair back for comfort and repositioned throughout session for pain management/fatigue in neck.  Pt taken to therapy gym total A. Completed very skilled mod A squat pivot transfer to therapy mat.  Completed x4 sit<>stand from EOM 3-muskateer style, min A overall with min A for controlled descent. Longest standing trial 20 seconds with min A overall Addressed scapular retraction and upright positioning on EOM with multimodal cuing and positioning of extremities to facilitate upright erect posture. Pt demonstrates functional anterior/posterior body control/core activation required to come forward in w/c in prep for transfer to correct minimal LOB episodes.  Reclined on wedge for supported rest break while facilitating chest expansion and pec. Stretch.  Stand pivot to standard chair with min A overall, max cuing for upright posture and sequencing of transfer.  Upon sitting in standard chair, pt voiced having had incontinent urination. Stand pivot back to w/c and transferred back to room in w/c.  She stood at sink with mod A +1 while +2 assist required to complete pericare/buttock hygiene.  Mod A stand pivot back to EOB in same manner as described above. Upon sitting EOB, pt felt she needed to have BM. Stand  pivot to [facility] with +2 assist required for toileting task. Pt positioned on BSC with B UEs supported on pillows and sat with supervision to have small BM.  Pt returned to bed at end of session, mod A to return to supine.  Pt left in supine at end of session with all needs in reach.   Therapy Documentation Precautions:  Precautions Precautions: Fall, Cervical Precaution Comments: Ok to doff brace in bed, when ambulating to bathroom, and while showering. C-collar applied in sitting position  Required Braces or Orthoses: Cervical Brace Cervical Brace: Hard collar Restrictions Weight Bearing Restrictions: No   Therapy/Group: Individual Therapy  Nivaan Dicenzo L 02/21/2019, 6:31 AM

## 2019-02-21 NOTE — Progress Notes (Signed)
Physical Therapy Session Note  Patient Details  Name: Denise Espinoza MRN: [id number] Date of Birth: 1930-01-11  Today's Date: 02/21/2019 PT Individual Time: 0800-0900; 1415-1500 PT Individual Time Calculation (min): 60 min and 45 min  Short Term Goals: Week 1:  PT Short Term Goal 1 (Week 1): Pt will perform bed mobility with max assist +2 PT Short Term Goal 2 (Week 1): Pt will perform bed<>chair transfer with max assist +2 PT Short Term Goal 3 (Week 1): Pt will perform sit<>stand with max assist +2 PT Short Term Goal 4 (Week 1): Pt will initiate w/c propulsion using B LEs  Skilled Therapeutic Interventions/Progress Updates:    Session 1: Pt received supine in bed, agreeable to PT session. No complaints of pain. Pt reports urge to urinate and have a bowel movement. Supine to sit with max A. Squat pivot transfer bed to bedside commode with assist x 2. Pt is able to have continent bowel movement x 2 while seated on commode. Dependent for leaning forwards while 2nd helper performs dependent pericare. Squat pivot commode to w/c with mod A. Dependent to don TED hose and pants. Sit to stand with min A x 2 (3 muskateers) to pull up pants dependently. Ambulation x 10 ft with min A x 2 in 3 muskateers position. Use of u-cuff with spoon in cuff for eating breakfast. Pt requires assist to get food onto spoon and then is able to bring spoon to her mouth with use of LUE 75% of the way, requires physical assist to fully bring to mouth. NT takes over feeding at end of session. Pt left seated in TIS w/c in room with needs in reach.  Session 2: Pt received supine in bed finishing brief change and pericare with NT. Pt is dependent to don pants. Supine to sit with mod A. Squat pivot transfer bed to w/c with mod-max A. Squat pivot transfer w/c to/from mat table with mod-max A. Sit to stand with min A x 2, 3 muskateers. Ambulation x 10 ft with 3 muskateers with manual cueing for quad extension and v/c for upright  posture. Pt also requires assist for LLE placement during gait, w/c follow for safety. Sitting balance EOM lateral leans L/R x 5 reps with max A to return to midline. Pt left reclined in TIS w/c in room with needs in reach at end of session.   Therapy Documentation Precautions:  Precautions Precautions: Fall, Cervical Precaution Comments: Ok to doff brace in bed, when ambulating to bathroom, and while showering. C-collar applied in sitting position  Required Braces or Orthoses: Cervical Brace Cervical Brace: Hard collar Restrictions Weight Bearing Restrictions: No    Therapy/Group: Individual Therapy   Peter Congo, PT, DPT  02/21/2019, 12:05 PM

## 2019-02-22 ENCOUNTER — Inpatient Hospital Stay (HOSPITAL_COMMUNITY): Payer: PRIVATE HEALTH INSURANCE | Admitting: Physical Therapy

## 2019-02-22 DIAGNOSIS — S14129D Central cord syndrome at unspecified level of cervical spinal cord, subsequent encounter: Secondary | ICD-10-CM

## 2019-02-22 NOTE — Progress Notes (Signed)
Physical Therapy Session Note  Patient Details  Name: Denise Espinoza MRN: [id number] Date of Birth: Mar 05, 1930  Today's Date: 02/22/2019 PT Individual Time: 1000-1100 PT Individual Time Calculation (min): 60 min   Short Term Goals: Week 1:  PT Short Term Goal 1 (Week 1): Pt will perform bed mobility with max assist +2 PT Short Term Goal 2 (Week 1): Pt will perform bed<>chair transfer with max assist +2 PT Short Term Goal 3 (Week 1): Pt will perform sit<>stand with max assist +2 PT Short Term Goal 4 (Week 1): Pt will initiate w/c propulsion using B LEs  Skilled Therapeutic Interventions/Progress Updates:    Pt received in bed on bedpan, has had continent BM. Rolling L/R with mod A for bedpan removal and dependent pericare and donning of new brief. Pt is dependent to don TED hose and pants, is able to bridge in supine so therapist can pull pants up over bottom. Supine to sit max A. Pt is max A for changing her shirt while seated EOB, requires assist from a 2nd person for sitting balance during clothing change. Squat pivot transfer bed to w/c with max A. Seated anterior leans starting in upright position and then returning to upright position for core strengthening and stability training x 10 reps, min A for balance. Seated anterior leans against gravity started in semi-reclined position, sitting upright, then returning to semi-reclined position, mod A to initiate anterior lean and fair eccentric controlling when returning to semi-recline. Attempt to have pt perform shoulder rolls for upper back/shoulder tightness, pt unable to perform exercise efficiently due to weakness. Semi-reclined towel roll stretch with towel roll between shoulder blades x 5 min. Pt left seated in TIS w/c in room with needs in reach in care of NT at end of session.  Therapy Documentation Precautions:  Precautions Precautions: Fall, Cervical Precaution Comments: Ok to doff brace in bed, when ambulating to bathroom, and while  showering. C-collar applied in sitting position  Required Braces or Orthoses: Cervical Brace Cervical Brace: Hard collar Restrictions Weight Bearing Restrictions: No    Therapy/Group: Individual Therapy   Peter Congo, PT, DPT  02/22/2019, 12:34 PM

## 2019-02-22 NOTE — Progress Notes (Signed)
Denise Espinoza is a 85 y.o. 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  Patient is status post laminectomy  Past Medical History:  Diagnosis Date  . Allergic rhinitis   . Anosmia   . Arthritis   . Full dentures   . History of diverticulitis   . Insomnia   . Insomnia   . Senile osteoporosis   . SOB (shortness of breath)   . SVT (supraventricular tachycardia) (HCC)   . Varicose vein of leg    left  . Wears glasses      Subjective: No new complaints. No new problems. Slept well. Feeling OK.  Objective: Vital signs in last 24 hours: Temp:  [97.3 F (36.3 C)-99.3 F (37.4 C)] 98.6 F (37 C) (05/16 0500) Pulse Rate:  [77-89] 77 (05/16 0500) Resp:  [12-20] 12 (05/16 0500) BP: (106-119)/(61-69) 106/61 (05/16 0500) SpO2:  [79 %-100 %] 97 % (05/16 0500) Weight change:  Last BM Date: 02/21/19  Intake/Output from previous day: 05/15 0701 - 05/16 0700 In: 240 [P.O.:240] Out: -    Patient Vitals for the past 24 hrs:  BP Temp Temp src Pulse Resp SpO2  02/22/19 0500 106/61 98.6 F (37 C) Oral 77 12 97 %  02/21/19 2006 116/62 98.9 F (37.2 C) Oral 79 14 100 %  02/21/19 1631 119/64 99.3 F (37.4 C) Oral 80 20 (!) 79 %  02/21/19 1428 118/69 (!) 97.3 F (36.3 C) Oral 89 19 100 %     Physical Exam General: No apparent distress   HEENT: not dry Lungs: Normal effort. Lungs clear to auscultation, no crackles or wheezes. Cardiovascular: Regular rate and rhythm, no edema Abdomen: S/NT/ND; BS(+) Musculoskeletal:  unchanged Neurological: No new neurological deficits; unable to raise right arm Extremities.  SCDs in place Skin: clear   Mental state: Alert, oriented, cooperative    Lab Results: BMET    Component Value Date/Time   NA 134 (L) 02/16/2019 1657   K 3.6 02/16/2019 1657   CL 98 02/16/2019 1657   CO2 23 02/16/2019 1657   GLUCOSE 215 (H) 02/16/2019 1657   BUN  14 02/16/2019 1657   CREATININE 0.54 02/16/2019 1657   CALCIUM 8.6 (L) 02/16/2019 1657   GFRNONAA >60 02/16/2019 1657   GFRAA >60 02/16/2019 1657   CBC    Component Value Date/Time   WBC 8.2 02/16/2019 1657   RBC 3.67 (L) 02/16/2019 1657   HGB 11.1 (L) 02/16/2019 1657   HCT 33.4 (L) 02/16/2019 1657   PLT 267 02/16/2019 1657   MCV 91.0 02/16/2019 1657   MCH 30.2 02/16/2019 1657   MCHC 33.2 02/16/2019 1657   RDW 13.0 02/16/2019 1657   LYMPHSABS 1.0 02/16/2019 1657   MONOABS 1.0 02/16/2019 1657   EOSABS 0.0 02/16/2019 1657   BASOSABS 0.0 02/16/2019 1657    Medications: I have reviewed the patient's current medications.  Assessment/Plan:  Functional and mobility deficits secondary to C2 fracture with associated myelopathy.  Continue CIR DVT prophylaxis.  Continue SCDs Neurogenic bowel and bladder.  Improving History of fever.  Appears to be trending down   Length of stay, days: 7  Gordy Savers , MD 02/22/2019, 9:17 AM

## 2019-02-23 LAB — CBC
HCT: 33.5 % — ABNORMAL LOW (ref 36.0–46.0)
Hemoglobin: 10.5 g/dL — ABNORMAL LOW (ref 12.0–15.0)
MCH: 30 pg (ref 26.0–34.0)
MCHC: 31.3 g/dL (ref 30.0–36.0)
MCV: 95.7 fL (ref 80.0–100.0)
Platelets: 220 10*3/uL (ref 150–400)
RBC: 3.5 MIL/uL — ABNORMAL LOW (ref 3.87–5.11)
RDW: 13.1 % (ref 11.5–15.5)
WBC: 7.7 10*3/uL (ref 4.0–10.5)
nRBC: 0 % (ref 0.0–0.2)

## 2019-02-23 LAB — URINALYSIS, ROUTINE W REFLEX MICROSCOPIC
Bacteria, UA: NONE SEEN
Bilirubin Urine: NEGATIVE
Glucose, UA: NEGATIVE mg/dL
Ketones, ur: NEGATIVE mg/dL
Nitrite: POSITIVE — AB
Protein, ur: NEGATIVE mg/dL
Specific Gravity, Urine: 1.013 (ref 1.005–1.030)
WBC, UA: >50 WBC/hpf — ABNORMAL HIGH (ref 0–5)
pH: 6 (ref 5.0–8.0)

## 2019-02-23 NOTE — Progress Notes (Signed)
Patient noted with temperature-101.4 orally. Pt presently asymptomatic. Dr. Amador Cunas notified, received new orders to administer prn tylenol, obtain blood cultures x2, and reschedule CBC lab draw for this am, instead of Monday. Temp recheck-100.4. Pt remains asymptomatic. Will continue to monitor.

## 2019-02-23 NOTE — Progress Notes (Signed)
Denise Espinoza is a 85 y.o. female who is admitted for CIR with functional deficits secondary to C2 fracture with associated myelopathy and central cord syndrome.  Patient is status post laminectomy.  Past Medical History:  Diagnosis Date  . Allergic rhinitis   . Anosmia   . Arthritis   . Full dentures   . History of diverticulitis   . Insomnia   . Insomnia   . Senile osteoporosis   . SOB (shortness of breath)   . SVT (supraventricular tachycardia) (HCC)   . Varicose vein of leg    left  . Wears glasses      Subjective: No new complaints. No new problems. Slept well. Feeling OK.  Intermittent fever throughout the day yesterday but afebrile this a.m. no focal complaints  Objective: Vital signs in last 24 hours: Temp:  [97.6 F (36.4 C)-101.5 F (38.6 C)] 97.6 F (36.4 C) (05/17 0620) Pulse Rate:  [67-94] 67 (05/17 0620) Resp:  [14-16] 16 (05/17 0620) BP: (109-111)/(56-64) 109/64 (05/17 0620) SpO2:  [97 %-100 %] 99 % (05/17 0620) Weight change:  Last BM Date: 02/22/19  Intake/Output from previous day: 05/16 0701 - 05/17 0700 In: 718 [P.O.:718] Out: 489 [Urine:489]  Patient Vitals for the past 24 hrs:  BP Temp Temp src Pulse Resp SpO2  02/23/19 0620 109/64 97.6 F (36.4 C) Oral 67 16 99 %  02/22/19 2230 - (!) 100.4 F (38 C) Oral - - -  02/22/19 2006 - (!) 101.4 F (38.6 C) Oral - - -  02/22/19 2005 - (!) 101.5 F (38.6 C) Oral - - -  02/22/19 2004 111/63 (!) 100.5 F (38.1 C) Oral 94 14 100 %  02/22/19 1439 (!) 111/56 99.3 F (37.4 C) Oral 91 14 97 %     Physical Exam General: No apparent distress afebrile this a.m. HEENT: not dry Lungs: Normal effort. Lungs clear to auscultation, no crackles or wheezes. Cardiovascular: Regular rate and rhythm, no edema Abdomen: S/NT/ND; BS(+) Musculoskeletal:  Unchanged; right wrist splint Neurological: No new neurological deficits Extremities.  SCDs in place Skin: clear    Mental state: Alert, oriented,  cooperative    Lab Results: BMET    Component Value Date/Time   NA 134 (L) 02/16/2019 1657   K 3.6 02/16/2019 1657   CL 98 02/16/2019 1657   CO2 23 02/16/2019 1657   GLUCOSE 215 (H) 02/16/2019 1657   BUN 14 02/16/2019 1657   CREATININE 0.54 02/16/2019 1657   CALCIUM 8.6 (L) 02/16/2019 1657   GFRNONAA >60 02/16/2019 1657   GFRAA >60 02/16/2019 1657   CBC    Component Value Date/Time   WBC 7.7 02/23/2019 0725   RBC 3.50 (L) 02/23/2019 0725   HGB 10.5 (L) 02/23/2019 0725   HCT 33.5 (L) 02/23/2019 0725   PLT 220 02/23/2019 0725   MCV 95.7 02/23/2019 0725   MCH 30.0 02/23/2019 0725   MCHC 31.3 02/23/2019 0725   RDW 13.1 02/23/2019 0725   LYMPHSABS 1.0 02/16/2019 1657   MONOABS 1.0 02/16/2019 1657   EOSABS 0.0 02/16/2019 1657   BASOSABS 0.0 02/16/2019 1657    Medications: I have reviewed the patient's current medications.  Assessment/Plan:  Functional and mobility deficits related to C2 fracture with associated myelopathy.  Continue CIR Intermittent fever.  Patient febrile throughout the day yesterday but afebrile this a.m. WBC count today 7.7.  We will continue to observe Neurogenic bowel and bladder.  Will check UA and urine culture DVT prophylaxis.  Continue SCDs  Length of stay, days: 8  Gordy Savers , MD 02/23/2019, 9:21 AM

## 2019-02-23 NOTE — Progress Notes (Addendum)
Patient slept well throughout the night. Medicated with tylenol and oxycodone this shift for c/o generalized pain. Refused dulcolax suppository this am, pt educated on bowel suppository and risks associated with noted, continues to refuse. Temp-97.6 this am.

## 2019-02-24 ENCOUNTER — Inpatient Hospital Stay (HOSPITAL_COMMUNITY): Payer: PRIVATE HEALTH INSURANCE | Admitting: Physical Therapy

## 2019-02-24 ENCOUNTER — Inpatient Hospital Stay (HOSPITAL_COMMUNITY): Payer: PRIVATE HEALTH INSURANCE | Admitting: Occupational Therapy

## 2019-02-24 LAB — BASIC METABOLIC PANEL
Anion gap: 11 (ref 5–15)
BUN: 13 mg/dL (ref 8–23)
CO2: 26 mmol/L (ref 22–32)
Calcium: 8.8 mg/dL — ABNORMAL LOW (ref 8.9–10.3)
Chloride: 101 mmol/L (ref 98–111)
Creatinine, Ser: 0.61 mg/dL (ref 0.44–1.00)
GFR calc Af Amer: >60 mL/min (ref >60)
GFR calc non Af Amer: >60 mL/min (ref >60)
Glucose, Bld: 95 mg/dL (ref 70–99)
Potassium: 4 mmol/L (ref 3.5–5.1)
Sodium: 138 mmol/L (ref 135–145)

## 2019-02-24 MED ORDER — SULFAMETHOXAZOLE-TRIMETHOPRIM 400-80 MG PO TABS: 1.0000 | ORAL_TABLET | Freq: Two times a day (BID) | ORAL | Status: DC

## 2019-02-24 MED ORDER — SULFAMETHOXAZOLE-TRIMETHOPRIM 400-80 MG PO TABS
1.0000 | ORAL_TABLET | Freq: Two times a day (BID) | ORAL | Status: AC
  Administered 2019-02-24 – 2019-03-01 (×10): 1 via ORAL
  Filled 2019-02-24 (×10): qty 1

## 2019-02-24 MED ORDER — SULFAMETHOXAZOLE-TRIMETHOPRIM 400-80 MG PO TABS
1.0000 | ORAL_TABLET | Freq: Two times a day (BID) | ORAL | Status: DC
  Filled 2019-02-24: qty 1

## 2019-02-24 NOTE — Progress Notes (Signed)
Physical Therapy Weekly Progress Note  Patient Details  Name: Denise Espinoza MRN: [id number] Date of Birth: May 26, 1930  Beginning of progress report period: Feb 16, 2019 End of progress report period: Feb 24, 2019  Today's Date: 02/24/2019 PT Individual Time: 0800-0915; 1330-1430 PT Individual Time Calculation (min): 75 min and 60 min  Patient has met 4 of 4 short term goals.  Pt has made great progress over the past week, progressing from consistently requiring assist x 2 for bed mobility and transfers to needing mod-max A x 1. Pt has progressed from needing assist x 2 for slide board transfers to performing squat and stand pivot transfers with mod-max A. Pt is able to stand with min A x 2 and ambulate up to 10 ft via 3 muskateers assist. Pt is able to propel manual w/c with use of BLE x 100 ft with Supervision.  Patient continues to demonstrate the following deficits muscle weakness, impaired timing and sequencing, abnormal tone, unbalanced muscle activation and decreased coordination and decreased sitting balance, decreased standing balance, decreased postural control and decreased balance strategies and therefore will continue to benefit from skilled PT intervention to increase functional independence with mobility. Pt continues to exhibit decreased BUE strength limiting her functional ability to use her UE to assist with mobility and other functional tasks. Pt continues to exhibit decreased sitting and standing balance and decreased awareness of LOB.  Patient progressing toward long term goals..  Continue plan of care.  PT Short Term Goals Week 1:  PT Short Term Goal 1 (Week 1): Pt will perform bed mobility with max assist +2 PT Short Term Goal 1 - Progress (Week 1): Met PT Short Term Goal 2 (Week 1): Pt will perform bed<>chair transfer with max assist +2 PT Short Term Goal 2 - Progress (Week 1): Met PT Short Term Goal 3 (Week 1): Pt will perform sit<>stand with max assist +2 PT Short Term  Goal 3 - Progress (Week 1): Met PT Short Term Goal 4 (Week 1): Pt will initiate w/c propulsion using B LEs PT Short Term Goal 4 - Progress (Week 1): Met Week 2:  PT Short Term Goal 1 (Week 2): Pt will complete least restrictive transfer with mod A consistently PT Short Term Goal 2 (Week 2): Pt will propel manual w/c with use of BLE x 150 ft with Supervision PT Short Term Goal 3 (Week 2): Pt will perform bed mobility with mod A consistently  Skilled Therapeutic Interventions/Progress Updates:    Session 1: Pt received seated in bed, agreeable to PT session. No complaints of pain this AM. Pt reports urge to have a BM. Supine to sit with max A. Squat pivot transfer bed to bedside commode max A. Seated balance on commode with SBA with BLE support. Pt is able to have a continent bowel movement on commode. Dependent with assist x 2 for pericare. Squat pivot transfer commode to w/c. Max A to dependent for donning new brief, pants, TED hose, shoes, and shirt while seated in w/c. Sit to stand with min A x 2, 3 muskateers, to pull pants up dependently. Pt set up with new high-back w/c to assess her ability to propel chair with use of BLE next session. Also provided pt with hybrid seat cushion as she has expressed discomfort with current use of standard cushion. Pt left semi-reclined in w/c in room with needs in reach, quick release belt and chair alarm in place.  Session 2: Pt received supine in bed, agreeable to PT  session. Pt reporting 8/10 HA, RN notified and able to provide pain medication. Rolling L/R with min A and use of bedrail for removal of maximove sling. Supine BLE there x 10 reps AAROM to AROM: heel slides, hip abd, ankle pumps, SLR, SKFO, bridges. Pt reports she has urinated in her brief. Rolling L/R with min A and use of bedrails for dependent pericare and brief change. Changed pt's phone over to her L side with assistive device attached to bed as she has better mobility in her LUE and can answer  phone calls with her LUE. Pt left semi-reclined in bed with needs in reach, bed alarm in place at end of session.   Therapy Documentation Precautions:  Precautions Precautions: Fall, Cervical Precaution Comments: Ok to doff brace in bed, when ambulating to bathroom, and while showering. C-collar applied in sitting position  Required Braces or Orthoses: Cervical Brace Cervical Brace: Hard collar Restrictions Weight Bearing Restrictions: No   Therapy/Group: Individual Therapy   Excell Seltzer, PT, DPT 02/24/2019, 12:05 PM

## 2019-02-24 NOTE — Progress Notes (Signed)
Occupational Therapy Session Note  Patient Details  Name: Denise Espinoza MRN: [id number] Date of Birth: 1930/08/30  Today's Date: 02/24/2019 OT Individual Time: 1100-1157 OT Individual Time Calculation (min): 57 min    Short Term Goals: Week 2:  OT Short Term Goal 1 (Week 2): Pt will consistently complete transfers with mod A +1 OT Short Term Goal 2 (Week 2): Pt will thread B UEs into shirt with min A OT Short Term Goal 3 (Week 2): Pt will complete toileting task with total A +1 in order to decrease burden of care OT Short Term Goal 4 (Week 2): Pt will tolerate 5 minutes of unsupported sitting with supervision in order to increase postural control/stability.  OT Short Term Goal 5 (Week 2): Pt will stand for 2 minutes with min A for balance in prep for ADL task.  Skilled Therapeutic Interventions/Progress Updates:    Upon entering the room, pt seated in wheelchair and agreeable to OT intervention. Pt with no c/o pain this session. Pt standing with max A and cushion removed from wheelchair and pt able to propel 100' with min A and use of B LEs. Pt needing assistance to "get started" but might also do better with different shoes. Pt standing again with max A and wheelchair cushion placed back in chair for comfort. Pt seated in wheelchair at table with elbows supported on table by therapist to work on controlled AROM to bring hand to mouth and then towards head. Pt with increased difficulty from R UE but control increased as session went on. Resistive pegs placed into board with pt utilizing gross grasp to obtain and then hold before releasing into therapist hand. Pt with 5/17 drops from R hand and 2/17 drops from L hand. Pt needing hand over hand assistance to get pegs into peg board with use of R UE. OT assisted pt back to room at end of session with soft call bell in lap. Chair alarm belt donned for safety and all needs within reach.   Therapy Documentation Precautions:  Precautions Precautions:  Fall, Cervical Precaution Comments: Ok to doff brace in bed, when ambulating to bathroom, and while showering. C-collar applied in sitting position  Required Braces or Orthoses: Cervical Brace Cervical Brace: Hard collar Restrictions Weight Bearing Restrictions: No Pain: Pain Assessment Pain Scale: 0-10 Pain Score: 5  Pain Type: Surgical pain Pain Location: Neck Pain Intervention(s): Medication (See eMAR) ADL: ADL Eating: Not assessed Grooming: Dependent Where Assessed-Grooming: Bed level Upper Body Bathing: Dependent Where Assessed-Upper Body Bathing: Edge of bed Lower Body Bathing: Other (comment)(2 assist) Where Assessed-Lower Body Bathing: Bed level Upper Body Dressing: Dependent Where Assessed-Upper Body Dressing: Bed level Lower Body Dressing: Other (Comment)(2 assist) Where Assessed-Lower Body Dressing: Bed level Toileting: Other (Comment)(2 assist) Where Assessed-Toileting: Bed level Toilet Transfer: Not assessed Tub/Shower Transfer: Not assessed   Therapy/Group: Individual Therapy  Alen Bleacher 02/24/2019, 12:47 PM

## 2019-02-24 NOTE — Plan of Care (Signed)
Problem: Education: Goal: Ability to verbalize activity precautions or restrictions will improve Description Patient will be able to verbalize safety precautions with minimal precautions.   Outcome: Progressing Goal: Knowledge of the prescribed therapeutic regimen will improve Outcome: Progressing Goal: Understanding of discharge needs will improve Outcome: Progressing   Problem: Activity: Goal: Ability to avoid complications of mobility impairment will improve Description Patient will remain free from falls.   Outcome: Progressing Goal: Ability to tolerate increased activity will improve Outcome: Progressing Goal: Will remain free from falls Outcome: Progressing   Problem: Bowel/Gastric: Goal: Gastrointestinal status for postoperative course will improve Description Patient will have BM at minimum every other day.   Outcome: Progressing   Problem: Clinical Measurements: Goal: Ability to maintain clinical measurements within normal limits will improve Description Patient VS will remain within defined limits.   Outcome: Progressing Goal: Postoperative complications will be avoided or minimized Outcome: Progressing Goal: Diagnostic test results will improve Outcome: Progressing   Problem: Pain Management: Goal: Pain level will decrease Description Patient will maintain pain level of <3/10 with medications.   Outcome: Progressing   Problem: Skin Integrity: Goal: Will show signs of wound healing Description Patient will remain free from PI with moderate assist to turning  Outcome: Progressing   Problem: Health Behavior/Discharge Planning: Goal: Identification of resources available to assist in meeting health care needs will improve Description Patient will notify staff of need for assist with minimal assist.  Outcome: Progressing   Problem: Bladder/Genitourinary: Goal: Urinary functional status for postoperative course will improve Description Patient will  notify staff in time to utilize bedpan or BSC with minimal assist.   Outcome: Progressing   Problem: Consults Goal: RH SPINAL CORD INJURY PATIENT EDUCATION Description  See Patient Education module for education specifics.  Outcome: Progressing   Problem: SCI BOWEL ELIMINATION Goal: RH STG MANAGE BOWEL WITH ASSISTANCE Description STG Manage Bowel with max Assistance.  Outcome: Progressing Flowsheets (Taken 02/24/2019 1400) STG: Pt will manage bowels with assistance: 2-Maximum assistance Goal: RH STG SCI MANAGE BOWEL WITH MEDICATION WITH ASSISTANCE Description STG SCI Manage bowel with medication with mod assistance.  Outcome: Progressing Goal: RH STG SCI MANAGE BOWEL PROGRAM W/ASSIST OR AS APPROPRIATE Description STG SCI Manage bowel program w/total assist or as appropriate.  Outcome: Progressing Flowsheets (Taken 02/24/2019 1400) STG: SCI Pt will manage bowels with assistance or as appropriate: Moderate assist   Problem: SCI BLADDER ELIMINATION Goal: RH STG MANAGE BLADDER WITH ASSISTANCE Description STG Manage Bladder With max Assistance  Outcome: Progressing Flowsheets (Taken 02/24/2019 1400) STG: Pt will manage bladder with assistance: 2-Maximum assistance Goal: RH STG SCI MANAGE BLADDER PROGRAM W/ASSISTANCE Description With max assistance  Outcome: Progressing Flowsheets (Taken 02/24/2019 1400) STG: SCI Pt will manage bladder with assistance or as appropriate: 3-Moderate assistance   Problem: RH SKIN INTEGRITY Goal: RH STG SKIN FREE OF INFECTION/BREAKDOWN Outcome: Progressing Goal: RH STG MAINTAIN SKIN INTEGRITY WITH ASSISTANCE Description STG Maintain Skin Integrity With max Assistance.  Outcome: Progressing Flowsheets (Taken 02/24/2019 1400) STG: Maintain skin integrity with assistance: 3-Moderate assistance Goal: RH STG ABLE TO PERFORM INCISION/WOUND CARE W/ASSISTANCE Description STG Able To Perform Incision/Wound Care With mod Assistance.  Outcome:  Progressing Flowsheets (Taken 02/24/2019 1400) STG: Pt will be able to perform incision/wound care with assistance: 3-Moderate assistance   Problem: RH SAFETY Goal: RH STG ADHERE TO SAFETY PRECAUTIONS W/ASSISTANCE/DEVICE Description STG Adhere to Safety Precautions With min  Assistance/Device.  Outcome: Progressing Flowsheets (Taken 02/24/2019 1400) STG:Pt will adhere to safety precautions with assistance/device: 3-Moderate assistance  Problem: RH PAIN MANAGEMENT Goal: RH STG PAIN MANAGED AT OR BELOW PT'S PAIN GOAL Description Rate pain < 3 on pain scale  Outcome: Progressing   Problem: RH KNOWLEDGE DEFICIT SCI Goal: RH STG INCREASE KNOWLEDGE OF SELF CARE AFTER SCI Description Patient will verbalize self-care needs with minimal assist.   Outcome: Progressing

## 2019-02-24 NOTE — Progress Notes (Signed)
[facility] PHYSICAL MEDICINE & REHABILITATION PROGRESS NOTE   Subjective/Complaints: Had a reasonable weekend. Pain under control. Feels that she needs to move her bowels now  ROS: Patient denies fever, rash, sore throat, blurred vision, nausea, vomiting, diarrhea, cough, shortness of breath or chest pain, joint or back pain, headache, or mood change.    Objective:   No results found. Recent Labs    02/23/19 0725  WBC 7.7  HGB 10.5*  HCT 33.5*  PLT 220   Recent Labs    02/24/19 0614  NA 138  K 4.0  CL 101  CO2 26  GLUCOSE 95  BUN 13  CREATININE 0.61  CALCIUM 8.8*    Intake/Output Summary (Last 24 hours) at 02/24/2019 0843 Last data filed at 02/23/2019 1759 Gross per 24 hour  Intake 444 ml  Output -  Net 444 ml     Physical Exam: Vital Signs Blood pressure 114/62, pulse 70, temperature 98.7 F (37.1 C), resp. rate 16, height 5' 2.99" (1.6 m), weight 64.2 kg, SpO2 95 %. Constitutional: No distress . Vital signs reviewed. HEENT: EOMI, oral membranes moist Neck: supple Cardiovascular: RRR without murmur. No JVD    Respiratory: CTA Bilaterally without wheezes or rales. Normal effort    GI: BS +, non-tender, non-distended   Musculoskeletal:  General: C-collar Neurological: more alert. Voice improving.  RUE 1+ to 2- prox to distal. LUE 2+ to 3-/5 prox to distal---exam stable today.  RLE 3+/5 prox to distal. LLE  4/5 prox to distal. Decreased LT and pain throughout although senses gross touch and pain below shoulders bilaterally.---no changes today.   DTR's 2+ UE and 3+ LE's.  Skin: Skin iswarmand dry. Psych: pleasant   Assessment/Plan: 1. Functional deficits secondary to cervical myelopathy/central cord which require 3+ hours per day of interdisciplinary therapy in a comprehensive inpatient rehab setting.  Physiatrist is providing close team supervision and 24 hour management of active medical problems listed below.  Physiatrist and rehab team continue  to assess barriers to discharge/monitor patient progress toward functional and medical goals  Care Tool:  Bathing        Body parts bathed by helper: Right arm, Left arm, Chest, Abdomen, Front perineal area, Buttocks, Right upper leg, Left upper leg, Right lower leg, Left lower leg, Face     Bathing assist Assist Level: 2 Helpers     Upper Body Dressing/Undressing Upper body dressing   What is the patient wearing?: Pull over shirt    Upper body assist Assist Level: Total Assistance - Patient < 25%    Lower Body Dressing/Undressing Lower body dressing      What is the patient wearing?: Underwear/pull up, Pants     Lower body assist Assist for lower body dressing: 2 Helpers     Toileting Toileting    Toileting assist Assist for toileting: 2 Helpers     Transfers Chair/bed transfer  Transfers assist     Chair/bed transfer assist level: Maximal Assistance - Patient 25 - 49%     Locomotion Ambulation   Ambulation assist   Ambulation activity did not occur: Safety/medical concerns  Assist level: 2 helpers(3 muskateers)   Max distance: 10'   Walk 10 feet activity   Assist  Walk 10 feet activity did not occur: Safety/medical concerns  Assist level: 2 helpers Assistive device: Other (comment)(3 muskateers)   Walk 50 feet activity   Assist Walk 50 feet with 2 turns activity did not occur: Safety/medical concerns  Walk 150 feet activity   Assist Walk 150 feet activity did not occur: Safety/medical concerns         Walk 10 feet on uneven surface  activity   Assist Walk 10 feet on uneven surfaces activity did not occur: Safety/medical concerns         Wheelchair     Assist Will patient use wheelchair at discharge?: Yes Type of Wheelchair: Manual    Wheelchair assist level: Dependent - Patient 0% Max wheelchair distance: 150 ft    Wheelchair 50 feet with 2 turns activity    Assist        Assist Level:  Dependent - Patient 0%   Wheelchair 150 feet activity     Assist     Assist Level: Dependent - Patient 0%    Medical Problem List and Plan: 1.Functional and mobility deficitssecondary to C2 fracture and 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 Pt is s/p C1 laminectomy, C2 ORIF and Occiput to C4 PLIF Patient is beginning CIR therapies today including PT and OT  -bilateral WHO's   -continue to provide education and positive reinforcement 2. Antithrombotics: -DVT/anticoagulation:Mechanical:Sequential compression devices, below kneeBilateral lower extremities. Dopplers pending. -antiplatelet therapy: N/A 3. Pain Management:Oxycodone prn--premedicate prior to therapy. Gabapentin tid for neuropathy 4. Mood:Team to provide ego support. LCSW to follow for evaluation and support. -antipsychotic agents: N/A 5. Neuropsych: This patientiscapable of making decisions on herown behalf. 6. Skin/Wound Care:Will order air matress overlay for pressure relief. 7. Fluids/Electrolytes/Nutrition:encourage PO 8. H/o KGM:[id number] metoprolol daily 9. Neurogenic bowel:   -improving continence, almost daily AM BM's 10. Neurogenic bladder:Has been incontinent for years. r   -time toileting to help improve continence 11. Fever: intermittent low grade temps  -no cough, diarrhea, suspicious skin areas  -cbc, bmet, cxr  Negatie.  surgical site remains CDI  -urine sent again for UA, UCX this weekend, UA +, UCX ordered but not processed?  - bc negative so far   -dopplers negative  -encourage IS, OOB as we've been doing  -wbc's 7.7  -if I get confirmation urine culture in process will start empiric keflex  LOS: 9 days A FACE TO FACE EVALUATION WAS PERFORMED  Ranelle Oyster 02/24/2019, 8:42 AM

## 2019-02-25 ENCOUNTER — Inpatient Hospital Stay (HOSPITAL_COMMUNITY): Payer: PRIVATE HEALTH INSURANCE | Admitting: Occupational Therapy

## 2019-02-25 ENCOUNTER — Inpatient Hospital Stay (HOSPITAL_COMMUNITY): Payer: PRIVATE HEALTH INSURANCE | Admitting: *Deleted

## 2019-02-25 ENCOUNTER — Inpatient Hospital Stay (HOSPITAL_COMMUNITY): Payer: PRIVATE HEALTH INSURANCE

## 2019-02-25 ENCOUNTER — Inpatient Hospital Stay (HOSPITAL_COMMUNITY): Payer: PRIVATE HEALTH INSURANCE | Admitting: Physical Therapy

## 2019-02-25 LAB — URINE CULTURE

## 2019-02-25 NOTE — Progress Notes (Signed)
Physical Therapy Session Note  Patient Details  Name: Denise Espinoza MRN: [id number] Date of Birth: [birth year]  Today's Date: 02/25/2019 PT Individual Time: 0800-0900; 1430-1530 PT Individual Time Calculation (min): 60 min and 60 min  Short Term Goals: Week 2:  PT Short Term Goal 1 (Week 2): Pt will complete least restrictive transfer with mod A consistently PT Short Term Goal 2 (Week 2): Pt will propel manual w/c with use of BLE x 150 ft with Supervision PT Short Term Goal 3 (Week 2): Pt will perform bed mobility with mod A consistently  Skilled Therapeutic Interventions/Progress Updates:    Session 1: Pt received seated in bed, agreeable to PT session. No complaints of pain. Pt reports urge to have a bowel movement. Supine to sit with mod A. Stand pivot transfer bed to bedside commode with mod A. Pt is SBA for seated balance on commode with BLE supported. Pt is able to have a continent bowel movement while on commode. Sit to stand with max A and mod to max A to maintain standing balance while 2nd person performs dependent pericare and places brief. Pt is dependent for LB dressing. Stand pivot transfer commode to w/c with mod A. Pt is able to feed herself breakfast with use of u-cuff on LUE and is able to bring her LUE to her mouth 90% of the way, requires assist for remaining 10%. Sit to stand with min A to rail in hallway for w/c cushion removal. Manual w/c propulsion x 100 ft with use of BLE with w/c cushion removed for improved reach to the floor with BLE, min A. Sit to stand with mod A to bedrail to place cushion back in w/c. Pt left semi-reclined in w/c in room with soft touch call button in reach, quick release belt and chair alarm in place.  Session 2: Pt received seated in w/c in room, fatigued but agreeable to therapy session. Pt reports discomfort after sitting in w/c for several hours. Education with patient to call for assist with repositioning or that she can stay up in w/c between  some therapy sessions but lay back down in bed after others throughout the day. Will continue to promote pt advocating for her care. Sit to stand with mod A to rail in hallway with LUE support on rail. Ambulation x 10', x 7' with LUE on rail and max A x 1, assist to advance LE and for knee ext in standing, w/c follow for safety, v/c for upright posture. Pt fatigues quickly with gait. Squat pivot transfer w/c to bed with mod A. Sit to supine max A for trunk and BLE management. Rolling L/R with min A for dependent brief change and pericare following urinary incontinence. Supine BLE therex: heel slides, hip abd, SLR, SKFO x 10 reps each. Pt left semi-reclined in bed with needs in reach, bed alarm in place at end of session.  Therapy Documentation Precautions:  Precautions Precautions: Fall, Cervical Precaution Comments: Ok to doff brace in bed, when ambulating to bathroom, and while showering. C-collar applied in sitting position  Required Braces or Orthoses: Cervical Brace Cervical Brace: Hard collar Restrictions Weight Bearing Restrictions: No    Therapy/Group: Individual Therapy   Peter Congo, PT, DPT  02/25/2019, 12:09 PM

## 2019-02-25 NOTE — Evaluation (Signed)
Recreational Therapy Assessment and Plan  Patient Details  Name: Denise Espinoza MRN: [id number] Date of Birth: 1930/08/29 Today's Date: 02/25/2019  Rehab Potential: Good ELOS: 3 weeks   Assessment Problem List:      Patient Active Problem List   Diagnosis Date Noted  . Central cord syndrome ([place]) 02/15/2019  . Stenosis of cervical spine with myelopathy ([place]) 02/04/2019    Past Medical History:      Past Medical History:  Diagnosis Date  . Allergic rhinitis   . Anosmia   . Arthritis   . Full dentures   . History of diverticulitis   . Insomnia   . Insomnia   . Senile osteoporosis   . SOB (shortness of breath)   . SVT (supraventricular tachycardia) ([place])   . Varicose vein of leg    left  . Wears glasses    Past Surgical History:       Past Surgical History:  Procedure Laterality Date  . COLON RESECTION  1989   diverticulitis  . COLONOSCOPY    . ESOPHAGEAL DILATION  2015   endoscopy  . POSTERIOR CERVICAL FUSION/FORAMINOTOMY N/A 02/11/2019   Procedure: POSTERIOR CERVICAL INSTRUMENTATION REMOVAL CERVICAL TWO;  Surgeon: Denise Lose, MD;  Location: [place];  Service: Neurosurgery;  Laterality: N/A;  . POSTERIOR CERVICAL LAMINECTOMY N/A 02/06/2019   Procedure: Cervical one LAMINECTOMY, Occiput-Cervical four fusion;  Surgeon: Denise Lose, MD;  Location: [place];  Service: Neurosurgery;  Laterality: N/A;    Assessment & Plan Clinical Impression: Patient is a 85 y.o. year old female with history of HTN, anosmia, SVT, few months history of BLE instability who was admitted via [facility] on 02/04/19 with reports of fall 3 weeks PTA with onset of RUE>LUE numbness and weakness. She was found to have displaced odontoid fracture with severe cervical spinal stenosis C1/C2 and C3/C4. She underwent C1 laminectomy for decompression fo spinal cord with ORIF C2 fracture and PLF occiput to C4 by Dr. Kathyrn Espinoza on 02/06/19. She was making good progress but severe  neck pain 5/2 with BLE instability. Follow up CT showed C2 laminar screw intruding into posterior spinal canal and right lateral recess. She was taken to OR for removal of C2 translaminar screw on 5/5 but continued to have decline in BUE strength and worsening of numbness.Follow up MRI spine 5/6 showed removal of C2 screw with continue anterolisthesis of C1 on nonunited dens fragment with continued canal stenosis and flattening of cord with abnormal T2 cord signal and surrounding edema. Symptoms felt to be due to edema and she was started on medrol dose pack. Therapy ongoing but patient continues to be limited by cervical myelopathy with weakness BUE/BLE, decreased initiation, anxiety and painaffecting ADLs and mobility. CIR recommended due to functional decline. .  Patient transferred to CIR on 02/15/2019 .   Pt presents with decreased activity tolerance, decreased functional mobility, decreased balance, decreased attention, decreased awareness Limiting pt's independence with leisure/community pursuits.   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) Leisure Participation Style: With Family/Friends Psychosocial / Spiritual Does patient have pets?: Yes Social interaction - Mood/Behavior: Cooperative Engineer, drilling for Education?: Yes Strengths/Weaknesses Patient Strengths/Abilities: Willingness to participate;Active premorbidly Patient weaknesses: Physical limitations TR Patient demonstrates impairments in the following area(s): Endurance;Motor;Safety;Skin Integrity  Plan Rec Therapy Plan Is patient appropriate for Therapeutic Recreation?: Yes Rehab Potential: Good Treatment times per week: Min 1 TR session >20 minutes Estimated Length of Stay: 3 weeks TR  Treatment/Interventions: Health visitor;Therapeutic  exercise;Wheelchair propulsion/positioning;1:1 session;Community reintegration;Recreation/leisure participation;UE/LE Chartered certified accountant training;Functional mobility training;Patient/family education;Therapeutic activities Recommendations for other services: Neuropsych  Recommendations for other services: Neuropsych  Discharge Criteria: Patient will be discharged from TR if patient refuses treatment 3 consecutive times without medical reason.  If treatment goals not met, if there is a change in medical status, if patient makes no progress towards goals or if patient is discharged from hospital.  The above assessment, treatment plan, treatment alternatives and goals were discussed and mutually agreed upon: by patient  Denise Espinoza 02/25/2019, 3:29 PM

## 2019-02-25 NOTE — Progress Notes (Signed)
[facility] PHYSICAL MEDICINE & REHABILITATION PROGRESS NOTE   Subjective/Complaints: Up with therapy. About to go to bathroom. No major issues. Appetite good.   ROS: Patient denies fever, rash, sore throat, blurred vision, nausea, vomiting, diarrhea, cough, shortness of breath or chest pain, joint or back pain, headache, or mood change.   Objective:   No results found. Recent Labs    02/23/19 0725  WBC 7.7  HGB 10.5*  HCT 33.5*  PLT 220   Recent Labs    02/24/19 0614  NA 138  K 4.0  CL 101  CO2 26  GLUCOSE 95  BUN 13  CREATININE 0.61  CALCIUM 8.8*    Intake/Output Summary (Last 24 hours) at 02/25/2019 0851 Last data filed at 02/24/2019 2230 Gross per 24 hour  Intake 77 ml  Output 100 ml  Net -23 ml     Physical Exam: Vital Signs Blood pressure 128/63, pulse 75, temperature 98 F (36.7 C), temperature source Oral, resp. rate 12, height 5' 2.99" (1.6 m), weight 64.2 kg, SpO2 98 %. Constitutional: No distress . Vital signs reviewed. HEENT: EOMI, oral membranes moist Neck: supple Cardiovascular: RRR without murmur. No JVD    Respiratory: CTA Bilaterally without wheezes or rales. Normal effort    GI: BS +, non-tender, non-distended  Musculoskeletal:  General: C-collar Neurological: more alert. Voice improving.  RUE  2- prox to  3 to 3+ distal. LUE  3+/5 prox to 4- distal- .  RLE 4-/5 prox to distal. LLE  4/5 prox to distal. Decreased LT and pain throughout although senses gross touch and pain below shoulders bilaterally.- .   DTR's 2+ UE and 3+ LE's.  Skin: Skin iswarmand dry. Psych: mood up beat   Assessment/Plan: 1. Functional deficits secondary to cervical myelopathy/central cord which require 3+ hours per day of interdisciplinary therapy in a comprehensive inpatient rehab setting.  Physiatrist is providing close team supervision and 24 hour management of active medical problems listed below.  Physiatrist and rehab team continue to assess barriers  to discharge/monitor patient progress toward functional and medical goals  Care Tool:  Bathing        Body parts bathed by helper: Right arm, Left arm, Chest, Abdomen, Front perineal area, Buttocks, Right upper leg, Left upper leg, Right lower leg, Left lower leg, Face     Bathing assist Assist Level: 2 Helpers     Upper Body Dressing/Undressing Upper body dressing   What is the patient wearing?: Pull over shirt    Upper body assist Assist Level: Total Assistance - Patient < 25%    Lower Body Dressing/Undressing Lower body dressing      What is the patient wearing?: Underwear/pull up, Pants     Lower body assist Assist for lower body dressing: 2 Helpers     Toileting Toileting    Toileting assist Assist for toileting: 2 Helpers     Transfers Chair/bed transfer  Transfers assist     Chair/bed transfer assist level: Maximal Assistance - Patient 25 - 49%     Locomotion Ambulation   Ambulation assist   Ambulation activity did not occur: Safety/medical concerns  Assist level: 2 helpers(3 muskateers)   Max distance: 10'   Walk 10 feet activity   Assist  Walk 10 feet activity did not occur: Safety/medical concerns  Assist level: 2 helpers Assistive device: Other (comment)(3 muskateers)   Walk 50 feet activity   Assist Walk 50 feet with 2 turns activity did not occur: Safety/medical concerns  Walk 150 feet activity   Assist Walk 150 feet activity did not occur: Safety/medical concerns         Walk 10 feet on uneven surface  activity   Assist Walk 10 feet on uneven surfaces activity did not occur: Safety/medical concerns         Wheelchair     Assist Will patient use wheelchair at discharge?: Yes Type of Wheelchair: Manual    Wheelchair assist level: Minimal Assistance - Patient > 75% Max wheelchair distance: 100'    Wheelchair 50 feet with 2 turns activity    Assist        Assist Level: Minimal  Assistance - Patient > 75%   Wheelchair 150 feet activity     Assist     Assist Level: Dependent - Patient 0%    Medical Problem List and Plan: 1.Functional and mobility deficitssecondary to C2 fracture and 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 Pt is s/p C1 laminectomy, C2 ORIF and Occiput to C4 PLIF Patient is beginning CIR therapies today including PT and OT  -bilateral WHO's   -team conf today 2. Antithrombotics: -DVT/anticoagulation:Mechanical:Sequential compression devices, below kneeBilateral lower extremities. Dopplers pending. -antiplatelet therapy: N/A 3. Pain Management:Oxycodone prn--premedicate prior to therapy. Gabapentin tid for neuropathy 4. Mood:Team to provide ego support. LCSW to follow for evaluation and support. -antipsychotic agents: N/A 5. Neuropsych: This patientiscapable of making decisions on herown behalf. 6. Skin/Wound Care:Will order air matress overlay for pressure relief. 7. Fluids/Electrolytes/Nutrition:encourage PO 8. H/o HQR:FXJOITGP metoprolol daily 9. Neurogenic bowel:   -improving continence, almost daily AM BM's 10. Neurogenic bladder:Has been incontinent for years. r   -time toileting to help improve continence 11. Fever: intermittent low grade temps, afebrile since yesterday  -no cough, diarrhea, suspicious skin areas  -cbc, bmet, cxr  Negative.  surgical site remains CDI  -urine sent again for UA, UCX this weekend, UA +, UCX just sent last night.  - bc negative so far   -dopplers negative  -encourage IS, OOB as we've been doing  -wbc's 7.7  Began empiric bactrim 5/18  LOS: 10 days A FACE TO FACE EVALUATION WAS PERFORMED  Ranelle Oyster 02/25/2019, 8:51 AM

## 2019-02-25 NOTE — Progress Notes (Signed)
Occupational Therapy Session Note  Patient Details  Name: Karon Devaux MRN: [id number] Date of Birth: 03-Nov-1929  Today's Date: 02/25/2019 OT Individual Time: 1100-1158 OT Individual Time Calculation (min): 58 min    Short Term Goals: Week 2:  OT Short Term Goal 1 (Week 2): Pt will consistently complete transfers with mod A +1 OT Short Term Goal 2 (Week 2): Pt will thread B UEs into shirt with min A OT Short Term Goal 3 (Week 2): Pt will complete toileting task with total A +1 in order to decrease burden of care OT Short Term Goal 4 (Week 2): Pt will tolerate 5 minutes of unsupported sitting with supervision in order to increase postural control/stability.  OT Short Term Goal 5 (Week 2): Pt will stand for 2 minutes with min A for balance in prep for ADL task.  Skilled Therapeutic Interventions/Progress Updates:    Upon entering the room, pt seated in wheelchair with no c/o pain. Pt agreeable to OT intervention. Pt requesting oral care and says, " I think it's been a week since I cleaned my dentures." Pt was able to reach and pull out top dentures with L hand. OT assisted pt with rinsing and soaking dentures. Hand over hand to brush mouth with toothbrush. Pt also requesting hair wash. Pt seated on edge of wheelchair and maintaining upright posture for 20 minutes while therapist washed and combed hair. OT also changing pads of hard collar while pt seated in wheelchair.Pt reporting sensitive area on top of R foot and it appeared that zipper from shoe was rubbing. OT unzipped shoe while pt seated in wheelchair for comfort. Pt continued to have no c/o pain with self care activities. Pt remained in wheelchair slightly tilted with chair alarm donned and soft touch call bell in lap.  Therapy Documentation Precautions:  Precautions Precautions: Fall, Cervical Precaution Comments: Ok to doff brace in bed, when ambulating to bathroom, and while showering. C-collar applied in sitting position  Required  Braces or Orthoses: Cervical Brace Cervical Brace: Hard collar Restrictions Weight Bearing Restrictions: No ADL: ADL Eating: Not assessed Grooming: Dependent Where Assessed-Grooming: Bed level Upper Body Bathing: Dependent Where Assessed-Upper Body Bathing: Edge of bed Lower Body Bathing: Other (comment)(2 assist) Where Assessed-Lower Body Bathing: Bed level Upper Body Dressing: Dependent Where Assessed-Upper Body Dressing: Bed level Lower Body Dressing: Other (Comment)(2 assist) Where Assessed-Lower Body Dressing: Bed level Toileting: Other (Comment)(2 assist) Where Assessed-Toileting: Bed level Toilet Transfer: Not assessed Tub/Shower Transfer: Not assessed   Therapy/Group: Individual Therapy  Alen Bleacher 02/25/2019, 12:26 PM

## 2019-02-26 ENCOUNTER — Inpatient Hospital Stay (HOSPITAL_COMMUNITY): Payer: PRIVATE HEALTH INSURANCE

## 2019-02-26 ENCOUNTER — Inpatient Hospital Stay (HOSPITAL_COMMUNITY): Payer: PRIVATE HEALTH INSURANCE | Admitting: Occupational Therapy

## 2019-02-26 NOTE — Progress Notes (Signed)
Occupational Therapy Session Note  Patient Details  Name: Denise Espinoza MRN: [id number] Date of Birth: [birth year]  Today's Date: 02/26/2019 OT Individual Time: 0930-1030 OT Individual Time Calculation (min): 60 min    Short Term Goals: Week 2:  OT Short Term Goal 1 (Week 2): Pt will consistently complete transfers with mod A +1 OT Short Term Goal 2 (Week 2): Pt will thread B UEs into shirt with min A OT Short Term Goal 3 (Week 2): Pt will complete toileting task with total A +1 in order to decrease burden of care OT Short Term Goal 4 (Week 2): Pt will tolerate 5 minutes of unsupported sitting with supervision in order to increase postural control/stability.  OT Short Term Goal 5 (Week 2): Pt will stand for 2 minutes with min A for balance in prep for ADL task.  Skilled Therapeutic Interventions/Progress Updates:    Pt seen for OT session focusing on ADL re-training and postural control. Pt awake in supine upon arrival, agreeable to tx session. She voiced generalized discomfort, RN made aware. Pt unable to recall that she has pain medicine ~1hour prior to tx session and not due for pain meds at this time. Pt willing to cont as able. She transferred to sitting EOB with mod A +1. Initially required min A and VCs for midline posture and unsupported sitting, however, progressing to supervision for dynamic sitting balance with intermittent VCs to correct posture.  She donned pants seated EOB, able to bend down to thread pants with steadying assist for balance. Pt with impaired sensation in B hands. She reports she can not feel pants in her hands, however, with vision able to see to complete functional task. She donned shirt with mod A. Able to doff shirt once shirt placed over head.  Attempted to stand with +1 assist, however, pt with posterior bias and not enough anterior lean to come into standing. Therefore, stood 3 muskateer style with min A +2 and total A to pull pants up. She ambulated ~68ft to w/c  3 musketeer style to w/c. She completed grooming tasks at sink. She stood to brush hair with +2 for safety, however, min A overall for standing balance with VCs for upright posture. Pt tolerating ~30 seconds in standing before requiring seated rest break.  Oral care completed at sink with total A for hygiene and thoroughness of dentures. Pt able to use toothbrush with built up handle to wash inside of mouth.  Pt left set up in reclining back w/c at end of session, set-up on phone talking to family and all needs in reach.   Therapy Documentation Precautions:  Precautions Precautions: Fall, Cervical Precaution Comments: Ok to doff brace in bed, when ambulating to bathroom, and while showering. C-collar applied in sitting position  Required Braces or Orthoses: Cervical Brace Cervical Brace: Hard collar Restrictions Weight Bearing Restrictions: No    Therapy/Group: Individual Therapy  Halah Whiteside L 02/26/2019, 7:06 AM

## 2019-02-26 NOTE — Progress Notes (Signed)
[facility] PHYSICAL MEDICINE & REHABILITATION PROGRESS NOTE   Subjective/Complaints: In bed, no new complaints. Pain controlled. Had questions about recovery  ROS: Patient denies fever, rash, sore throat, blurred vision, nausea, vomiting, diarrhea, cough, shortness of breath or chest pain, joint or back pain, headache, or mood change.    Objective:   No results found. No results for input(s): WBC, HGB, HCT, PLT in the last 72 hours. Recent Labs    02/24/19 0614  NA 138  K 4.0  CL 101  CO2 26  GLUCOSE 95  BUN 13  CREATININE 0.61  CALCIUM 8.8*   No intake or output data in the 24 hours ending 02/26/19 0858   Physical Exam: Vital Signs Blood pressure 110/64, pulse 76, temperature 97.9 F (36.6 C), temperature source Oral, resp. rate 15, height 5' 2.99" (1.6 m), weight 60.2 kg, SpO2 96 %. Constitutional: No distress . Vital signs reviewed. HEENT: EOMI, oral membranes moist Neck: supple Cardiovascular: RRR without murmur. No JVD    Respiratory: CTA Bilaterally without wheezes or rales. Normal effort    GI: BS +, non-tender, non-distended  Musculoskeletal:  General: C-collar Neurological: more alert. Voice improving.  RUE  3/5 deltoid,biceps  to 3+ distal. LUE  3+/5 prox to 4 distal- .  RLE 4-/5 prox to distal. LLE  4/5 prox to distal. Decreased LT and pain throughout although senses gross touch and pain below shoulders bilaterally.---no sig sensory change.   DTR's 2+ UE and 3+ LE's.  Skin: Skin iswarmand dry. Psych: pleasant   Assessment/Plan: 1. Functional deficits secondary to cervical myelopathy/central cord which require 3+ hours per day of interdisciplinary therapy in a comprehensive inpatient rehab setting.  Physiatrist is providing close team supervision and 24 hour management of active medical problems listed below.  Physiatrist and rehab team continue to assess barriers to discharge/monitor patient progress toward functional and medical goals  Care  Tool:  Bathing        Body parts bathed by helper: Right arm, Left arm, Chest, Abdomen, Front perineal area, Buttocks, Right upper leg, Left upper leg, Right lower leg, Left lower leg, Face     Bathing assist Assist Level: 2 Helpers     Upper Body Dressing/Undressing Upper body dressing   What is the patient wearing?: Pull over shirt    Upper body assist Assist Level: Total Assistance - Patient < 25%    Lower Body Dressing/Undressing Lower body dressing      What is the patient wearing?: Underwear/pull up, Pants     Lower body assist Assist for lower body dressing: 2 Helpers     Toileting Toileting    Toileting assist Assist for toileting: 2 Helpers     Transfers Chair/bed transfer  Transfers assist     Chair/bed transfer assist level: Maximal Assistance - Patient 25 - 49%     Locomotion Ambulation   Ambulation assist   Ambulation activity did not occur: Safety/medical concerns  Assist level: Maximal Assistance - Patient 25 - 49% Assistive device: Other (comment)(rail in hallway) Max distance: 10'   Walk 10 feet activity   Assist  Walk 10 feet activity did not occur: Safety/medical concerns  Assist level: Maximal Assistance - Patient 25 - 49% Assistive device: Other (comment)(rail in hallway)   Walk 50 feet activity   Assist Walk 50 feet with 2 turns activity did not occur: Safety/medical concerns         Walk 150 feet activity   Assist Walk 150 feet activity did not occur:  Safety/medical concerns         Walk 10 feet on uneven surface  activity   Assist Walk 10 feet on uneven surfaces activity did not occur: Safety/medical concerns         Wheelchair     Assist Will patient use wheelchair at discharge?: Yes Type of Wheelchair: Manual    Wheelchair assist level: Minimal Assistance - Patient > 75% Max wheelchair distance: 100'    Wheelchair 50 feet with 2 turns activity    Assist        Assist Level:  Minimal Assistance - Patient > 75%   Wheelchair 150 feet activity     Assist     Assist Level: Dependent - Patient 0%    Medical Problem List and Plan: 1.Functional and mobility deficitssecondary to C2 fracture and 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 Pt is s/p C1 laminectomy, C2 ORIF and Occiput to C4 PLIF -Continue CIR therapies including PT, OT   -bilateral WHO's   -making substantial gains in UE movement. Exceeding initial goals. May have a chance to go home depending upon abilities of social supports 2. Antithrombotics: -DVT/anticoagulation:Mechanical:Sequential compression devices, below kneeBilateral lower extremities. Dopplers negative -antiplatelet therapy: N/A 3. Pain Management:Oxycodone prn--premedicate prior to therapy. Gabapentin tid for neuropathy 4. Mood:Team to provide ego support. LCSW to follow for evaluation and support. -antipsychotic agents: N/A 5. Neuropsych: This patientiscapable of making decisions on herown behalf. 6. Skin/Wound Care:Will order air matress overlay for pressure relief. 7. Fluids/Electrolytes/Nutrition:encourage PO 8. H/o UXL:[id number] metoprolol daily 9. Neurogenic bowel:   -improving continence, almost daily AM BM's 10. Neurogenic bladder:Has been incontinent for years.   -time toileting to help improve continence 11. Fever: now afebrile x 48 hours  -no cough, diarrhea, suspicious skin areas  -cbc, bmet, cxr  Negative.  surgical site remains CDI  -  UA +, UCX with multispecies--will re-collect  - bc negative but still pending.   -dopplers negative  -encourage IS, OOB as we've been doing  -wbc's 7.7  Continue empiric bactrim 5/18 for now  LOS: 11 days A FACE TO FACE EVALUATION WAS PERFORMED  Ranelle Oyster 02/26/2019, 8:58 AM

## 2019-02-26 NOTE — Progress Notes (Signed)
Physical Therapy Session Note  Patient Details  Name: Denise Espinoza MRN: [id number] Date of Birth: 06/24/1930  Today's Date: 02/26/2019 PT Individual Time: 1445-1530 PT Individual Time Calculation (min): 45 min   Short Term Goals: Week 2:  PT Short Term Goal 1 (Week 2): Pt will complete least restrictive transfer with mod A consistently PT Short Term Goal 2 (Week 2): Pt will propel manual w/c with use of BLE x 150 ft with Supervision PT Short Term Goal 3 (Week 2): Pt will perform bed mobility with mod A consistently  Skilled Therapeutic Interventions/Progress Updates:     Session 1: Patient in bed upon PT arrival. Patient alert and agreeable to PT session. Patient reported that she was on the bed pan upon PT arrival. She also reported that she had not eaten lunch yet this afternoon, lunch arrived just after PT arrived.  Therapeutic Activity: Bed Mobility: Patient performed rolling R and L to get off the bed pan with moderate A and don a new brief and pants with total A for LB dressing. Patient was able to perform bridging x3 for PT to assist with pulling her pants up in supine. PT noticed some red/purple discoloration on the patients sacrum and redness around her rectum, stool was noted to be very firm from BM, RN was made aware and observed discoloration of her sacrum during session. The patient went from supine to/from sit with min A for LE and trunk management from a flat bed with use of bed rails. Provided verbal cues for log roll technique. Patient sat EOB and ate her lunch during session. PT provided foam padding for utensils and patient was able to feed herself with her L UE with intermittent min A with hand over hand technique for 15 minutes before fatigue.    Neuromuscular Re-ed: Patient performed sat EOB x20 minutes while PT assisted her with eating her lunch with single and double UE support with CGA-min A due to intermittent posterior lean. Patient was educated on the importance of  nutrition and fluid intake for her recovery.  Patient in bed at end of session with breaks locked, bed alarm set, and all needs within reach.   Session 2: Patient in bed upon PT arrival. Patient alert and agreeable to PT session.  Therapeutic Activity: Bed Mobility: Patient performed supine to/from sit with CGA-min A for LE and trunk managment. Provided verbal cues for log roll technique. Transfers: Patient performed sit to/from stand x2 and stand pivot transfers x2 with mod-min A with B HHA. Provided verbal cues for leaning forward to stand and use of B UEs.  Gait Training:  Patient ambulated 5 feet x1 using a RW with max A with great difficulty keeping R UE on RW. She then ambulated 15 feet x2 using B HHA with therapist in front of her and a w/c follow behind her. Ambulated with decreased step length, narrow BOS, increased B hip and knee flexion in stance, decreased step height, and downward head gaze. Provided verbal cues for erect posture, quad activation in stance, and increased step length.  Patient in bed at end of session with breaks locked, bed alarm set, and all needs within reach. Patient was agreeable to getting up to sit in the w/c at 5pm for a video call with her family and to eat dinner, stated she was very fatigued after session and wanted to lie in bed for a while to rest.     Therapy Documentation Precautions:  Precautions Precautions: Fall, Cervical Precaution Comments:  Ok to doff brace in bed, when ambulating to bathroom, and while showering. C-collar applied in sitting position  Required Braces or Orthoses: Cervical Brace Cervical Brace: Hard collar Restrictions Weight Bearing Restrictions: No Pain:  Patient denied pain throughout session.    Therapy/Group: Individual Therapy  Kellis Topete L Marquin Patino PT, DPT  02/26/2019, 4:21 PM

## 2019-02-27 ENCOUNTER — Inpatient Hospital Stay (HOSPITAL_COMMUNITY): Payer: PRIVATE HEALTH INSURANCE | Admitting: Occupational Therapy

## 2019-02-27 ENCOUNTER — Ambulatory Visit (HOSPITAL_COMMUNITY): Payer: PRIVATE HEALTH INSURANCE | Admitting: *Deleted

## 2019-02-27 LAB — CULTURE, BLOOD (ROUTINE X 2)
Culture: NO GROWTH
Culture: NO GROWTH
Special Requests: ADEQUATE
Special Requests: ADEQUATE

## 2019-02-27 NOTE — Progress Notes (Signed)
Social Work Patient ID: Denise Espinoza, female   DOB: 1930/10/06, 85 y.o.   MRN: [id number]  Have reviewed team conference with pt and daughter, Elita Quick.  Both aware team feels pt is making good gains and seeing some neurological return in UE.  Goal to help pt reach more of a consistent mod assist before planning SNF transfer.  Plan still for SNF and hopeful that her progress will continue to a level she might eventually be able to be managed at home.  Family members in the home with either there own health issues or they are working.  Continue to follow.  Addi Pak, LCSW

## 2019-02-27 NOTE — Patient Care Conference (Signed)
Inpatient RehabilitationTeam Conference and Plan of Care Update Date: 02/25/2019   Time: 2:15 PM    Patient Name: Denise Espinoza      Medical Record Number: [id number]  Date of Birth: 1930/09/10 Sex: Female         Room/Bed: 4W04C/4W04C-01 Payor Info: Payor: MEDICARE / Plan: MEDICARE PART A AND B / Product Type: *No Product type* /    Admitting Diagnosis: SCI Team  Other TSC dysf, central cord syndrome ; 21-50days, 36 final rule  Admit Date/Time:  02/15/2019  4:54 PM Admission Comments: No comment available   Primary Diagnosis:  <principal problem not specified> Principal Problem: <principal problem not specified>  Patient Active Problem List   Diagnosis Date Noted  . Central cord syndrome (HCC) 02/15/2019  . 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    Expected Discharge Date: Expected Discharge Date: (SNF)  Team Members Present: Physician leading conference: Dr. Faith Rogue Social Worker Present: Amada Jupiter, LCSW Nurse Present: Greta Doom, RN PT Present: Other (comment)(Taylor Serita Grit, PT) OT Present: Amy Rounds, OT PPS Coordinator present : Fae Pippin     Current Status/Progress Goal Weekly Team Focus  Medical   improving movement in UE's. bowel and bladder have improved and not too far from baseline. pain controllled  continue maximize motor return  ongoing neuro considerations, pain, bowel and bladder   Bowel/Bladder   In cont x2 LBM 5/19  Maintain regular pattern. Encourage timed toileting.  assess and assit with toileting needs qshift and PRN   Swallow/Nutrition/ Hydration             ADL's   max A squat pivot transfer, total A for UB and LB self care  max A overall   functional transfers/mobility, sitting balance, UE AAROM for ADL tasks with AE, education   Mobility   mod A rolling, max A supine to/from sit, mod to max A squat and stand pivot transfer, min A x 2 sit to stand and gait x 10', S w/c mobility with BLE  max A overall  sitting  balance, transfers, w/c mobility, gait as able   Communication             Safety/Cognition/ Behavioral Observations            Pain   no c/o pain on scheduled oxycodone 10mg  BID. PRN narco, diluadid, and oxycodone ordered.  pain less than 3  assess pain qshift and PRN   Skin   no skin issues  Maintain skin integrity and prevent skin breakdown.  assess skin qshit and PRN    Rehab Goals Patient on target to meet rehab goals: Yes *See Care Plan and progress notes for long and short-term goals.     Barriers to Discharge  Current Status/Progress Possible Resolutions Date Resolved   Physician    Medical stability;Neurogenic Bowel & Bladder        see medical progress notes      Nursing                  PT  Decreased caregiver support;Home environment access/layout  pt will require 24/7 assist upon d/c that family cannot provide              OT                  SLP                SW  Discharge Planning/Teaching Needs:  D/C plan at time of CIR admission was SNF - team aware.  NA   Team Discussion:  Improving b/b function?  Bladder emptying better.  Slight neurlogical gains in UE;  Pain controlled.  OT plans to begin addressing bathing this week.  Min to max assist overall and team goal is consistent mod assist for SNF transfer.  Revisions to Treatment Plan:  NA    Continued Need for Acute Rehabilitation Level of Care: The patient requires daily medical management by a physician with specialized training in physical medicine and rehabilitation for the following conditions: Daily direction of a multidisciplinary physical rehabilitation program to ensure safe treatment while eliciting the highest outcome that is of practical value to the patient.: Yes Daily medical management of patient stability for increased activity during participation in an intensive rehabilitation regime.: Yes Daily analysis of laboratory values and/or radiology reports with any subsequent need  for medication adjustment of medical intervention for : Post surgical problems;Neurological problems   I attest that I was present, lead the team conference, and concur with the assessment and plan of the team.   Amada Jupiter 02/27/2019, 8:41 AM   Team conference was held via web/ teleconference due to COVID - 19

## 2019-02-27 NOTE — Progress Notes (Signed)
Occupational Therapy Session Note  Patient Details  Name: Denise Espinoza MRN: [id number] Date of Birth: 25-Jun-1930  Today's Date: 02/27/2019 OT Individual Time: 2563-8937 OT Individual Time Calculation (min): 54 min    Short Term Goals: Week 2:  OT Short Term Goal 1 (Week 2): Pt will consistently complete transfers with mod A +1 OT Short Term Goal 2 (Week 2): Pt will thread B UEs into shirt with min A OT Short Term Goal 3 (Week 2): Pt will complete toileting task with total A +1 in order to decrease burden of care OT Short Term Goal 4 (Week 2): Pt will tolerate 5 minutes of unsupported sitting with supervision in order to increase postural control/stability.  OT Short Term Goal 5 (Week 2): Pt will stand for 2 minutes with min A for balance in prep for ADL task.  Skilled Therapeutic Interventions/Progress Updates:    Pt seen for OT session focusing on functional transfers, ADL re-training and postural control. Pt awake in supine upon arrival, denied pain and agreeable to tx session. She transferred to sitting EOB with mod A using hospital bed functions and VCs for technique. Min A for sitting balance EOB with VCs for awareness to midline orientation as pt with R lean, however, does not self-correct unless cued. Max A +1 stand pivot transfer to [facility] with HHA and max cuing for sequencing. +2 assist required for toileting task following very minimal B/B void. She dressed seated on BSC with steadying assist for balance and min A for clothing orientation and management in order to thread pants onto LEs. She was able to thread B UEs into shirt, however, required assist for pulling shirt over head. +2 HHA for stand pivot back to bed. Attempted sliding board transfer to w/c as alternate transfer method for nursing, however, pt with body space awareness and movement and required max-total A for sliding board transfer. Therefore, will leave at [facility] transfer at this time for nursing. She ambulated with max A  +2 HHA to w/c, ~86ft away. Pt with tandem walking stance and required max VCs for upright posture during standing. Pt left seated in w/c at end of session, all needs in reach.   Therapy Documentation Precautions:  Precautions Precautions: Fall, Cervical Precaution Comments: Ok to doff brace in bed, when ambulating to bathroom, and while showering. C-collar applied in sitting position  Required Braces or Orthoses: Cervical Brace Cervical Brace: Hard collar Restrictions Weight Bearing Restrictions: No   Therapy/Group: Individual Therapy  Elizer Bostic L 02/27/2019, 6:59 AM

## 2019-02-27 NOTE — Plan of Care (Signed)
  Problem: Education: Goal: Ability to verbalize activity precautions or restrictions will improve Description Patient will be able to verbalize safety precautions with minimal precautions.   Outcome: Progressing Goal: Knowledge of the prescribed therapeutic regimen will improve Outcome: Progressing Goal: Understanding of discharge needs will improve Outcome: Progressing   Problem: Activity: Goal: Ability to avoid complications of mobility impairment will improve Description Patient will remain free from falls.   Outcome: Progressing Goal: Ability to tolerate increased activity will improve Outcome: Progressing Goal: Will remain free from falls Outcome: Progressing   Problem: Bowel/Gastric: Goal: Gastrointestinal status for postoperative course will improve Description Patient will have BM at minimum every other day.   Outcome: Progressing   Problem: Clinical Measurements: Goal: Ability to maintain clinical measurements within normal limits will improve Description Patient VS will remain within defined limits.   Outcome: Progressing Goal: Postoperative complications will be avoided or minimized Outcome: Progressing Goal: Diagnostic test results will improve Outcome: Progressing   Problem: Pain Management: Goal: Pain level will decrease Description Patient will maintain pain level of <3/10 with medications.   Outcome: Progressing   Problem: Skin Integrity: Goal: Will show signs of wound healing Description Patient will remain free from PI with moderate assist to turning  Outcome: Progressing   Problem: Health Behavior/Discharge Planning: Goal: Identification of resources available to assist in meeting health care needs will improve Description Patient will notify staff of need for assist with minimal assist.  Outcome: Progressing   Problem: Bladder/Genitourinary: Goal: Urinary functional status for postoperative course will improve Description Patient will  notify staff in time to utilize bedpan or BSC with minimal assist.   Outcome: Progressing   Problem: Consults Goal: RH SPINAL CORD INJURY PATIENT EDUCATION Description  See Patient Education module for education specifics.  Outcome: Progressing   Problem: SCI BOWEL ELIMINATION Goal: RH STG MANAGE BOWEL WITH ASSISTANCE Description STG Manage Bowel with max Assistance.  Outcome: Progressing Goal: RH STG SCI MANAGE BOWEL WITH MEDICATION WITH ASSISTANCE Description STG SCI Manage bowel with medication with mod assistance.  Outcome: Progressing Goal: RH STG SCI MANAGE BOWEL PROGRAM W/ASSIST OR AS APPROPRIATE Description STG SCI Manage bowel program w/total assist or as appropriate.  Outcome: Progressing   Problem: SCI BLADDER ELIMINATION Goal: RH STG MANAGE BLADDER WITH ASSISTANCE Description STG Manage Bladder With max Assistance  Outcome: Progressing Goal: RH STG SCI MANAGE BLADDER PROGRAM W/ASSISTANCE Description With max assistance  Outcome: Progressing   Problem: RH SKIN INTEGRITY Goal: RH STG SKIN FREE OF INFECTION/BREAKDOWN Outcome: Progressing Goal: RH STG MAINTAIN SKIN INTEGRITY WITH ASSISTANCE Description STG Maintain Skin Integrity With max Assistance.  Outcome: Progressing Goal: RH STG ABLE TO PERFORM INCISION/WOUND CARE W/ASSISTANCE Description STG Able To Perform Incision/Wound Care With mod Assistance.  Outcome: Not Progressing   Problem: RH SAFETY Goal: RH STG ADHERE TO SAFETY PRECAUTIONS W/ASSISTANCE/DEVICE Description STG Adhere to Safety Precautions With min  Assistance/Device.  Outcome: Progressing   Problem: RH PAIN MANAGEMENT Goal: RH STG PAIN MANAGED AT OR BELOW PT'S PAIN GOAL Description Rate pain < 3 on pain scale  Outcome: Progressing   Problem: RH KNOWLEDGE DEFICIT SCI Goal: RH STG INCREASE KNOWLEDGE OF SELF CARE AFTER SCI Description Patient will verbalize self-care needs with minimal assist.   Outcome: Progressing   Problem: RH  Pre-functional/Other (Specify) Goal: RH LTG Interdisciplinary (Specify) 1 Description Pt will direct her care (needs, preferences, set up assistance, instructing caregivers) 50% of the time.  Outcome: Progressing
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
## 2019-02-27 NOTE — Progress Notes (Signed)
Physical Therapy Session Note  Patient Details  Name: Denise Espinoza MRN: [id number] Date of Birth: [birth year]  Today's Date: 02/27/2019 PT Individual Time: 1100-3496 PT Individual Time Calculation (min): 42 min   Short Term Goals: Week 2:  PT Short Term Goal 1 (Week 2): Pt will complete least restrictive transfer with mod A consistently PT Short Term Goal 2 (Week 2): Pt will propel manual w/c with use of BLE x 150 ft with Supervision PT Short Term Goal 3 (Week 2): Pt will perform bed mobility with mod A consistently  Skilled Therapeutic Interventions/Progress Updates:   Pt received sitting in w/c with Rec Therapist present and pt agreeable to therapy session. Rec Therapist present for co-treatment this session. Pt wearing hard cervical collar for entire session with therapist readjusting for improved fit at beginning of session. Pt transported to/from gym in w/c. Therapists fit pt with bilateral platform RW to allow increased UE support during ambulation due to decreased ability to sustain grasp on regular RW. Pt performed sit<>stand w/c to platform RW with min/mod assist of 1-2 for lifting and 2nd person present for safety. Pt ambulated~24ft, ~70ft (seated break between) with bilateral platform RW with min/mod assist of 2 for balance, B LE knee guarding, and AD management - pt demonstrated narrow BOS with scissoring gait on 1st trial with cuing and education on wider BOS and improvement noted - pt reported feeling light headed after 1st walk and provided seated rest break reclined in w/c. Pt transported back to room and performed stand pivot transfer w/c>EOB using platform RW with min/mod assist of 2 for safety with balance and AD management. Sit>supine with min assist for B LE management. Pt educated on importance of rolling every 2 hours to avoid prolonged pressure on sacrum - nursing staff present and notified of rolling schedule. Pt left R semi-sidelying in bed with needs in reach and bed alarm  on.  Therapy Documentation Precautions:  Precautions Precautions: Fall, Cervical Precaution Comments: Ok to doff brace in bed, when ambulating to bathroom, and while showering. C-collar applied in sitting position  Required Braces or Orthoses: Cervical Brace Cervical Brace: Hard collar Restrictions Weight Bearing Restrictions: No  Pain: No reports of pain throughout session.   Therapy/Group: Co-Treatment  Ginny Forth, PT, DPT 02/27/2019, 1:17 PM

## 2019-02-27 NOTE — Progress Notes (Signed)
Occupational Therapy Session Note  Patient Details  Name: Denise Espinoza MRN: [id number] Date of Birth: 1930/04/18  Today's Date: 02/27/2019 OT Individual Time: 1430-1530 OT Individual Time Calculation (min): 60 min    Short Term Goals: Week 2:  OT Short Term Goal 1 (Week 2): Pt will consistently complete transfers with mod A +1 OT Short Term Goal 2 (Week 2): Pt will thread B UEs into shirt with min A OT Short Term Goal 3 (Week 2): Pt will complete toileting task with total A +1 in order to decrease burden of care OT Short Term Goal 4 (Week 2): Pt will tolerate 5 minutes of unsupported sitting with supervision in order to increase postural control/stability.  OT Short Term Goal 5 (Week 2): Pt will stand for 2 minutes with min A for balance in prep for ADL task.  Skilled Therapeutic Interventions/Progress Updates:    Patient in bed and ready for therapy session.  Supine to SSP with min A.  Collar donned.  SPT to/from commode with mod A of one.  Pants up/down, change of brief and hygiene max A of one with stand by of 2nd person.  Continent of bowel, brief was wet.  Tolerated unsupported sitting at edge of bed for 30 minutes with CS - completed bilateral UE AROM/AAROM/stretching activities.  Completed scooping/spearing with built up handled utensils and hand to mouth with various positions to promote more independence with eating.  Scooping of ice chips was difficult, could bring to mouth but often hit the front of the cervical collar - cues for elbow position to keep spoon level (adjusted angle for optimal scooping and transition).  Spearing more successful but force difficult to control thus breaking piece of food in half.  Patient returned to bed with alarm set, call bell in reach and phone accessible.    Therapy Documentation Precautions:  Precautions Precautions: Fall, Cervical Precaution Comments: Ok to doff brace in bed, when ambulating to bathroom, and while showering. C-collar applied in  sitting position  Required Braces or Orthoses: Cervical Brace Cervical Brace: Hard collar Restrictions Weight Bearing Restrictions: No General:   Vital Signs: Therapy Vitals Temp: 98.3 F (36.8 C) Temp Source: Oral Pulse Rate: 89 Resp: 18 BP: 98/60 Patient Position (if appropriate): Lying Oxygen Therapy O2 Device: Room Air Pain: Pain Assessment Pain Scale: 0-10 Pain Score: 0-No pain   Other Treatments:     Therapy/Group: Individual Therapy  Barrie Lyme 02/27/2019, 3:35 PM

## 2019-02-28 ENCOUNTER — Inpatient Hospital Stay (HOSPITAL_COMMUNITY): Payer: PRIVATE HEALTH INSURANCE | Admitting: Occupational Therapy

## 2019-02-28 ENCOUNTER — Inpatient Hospital Stay (HOSPITAL_COMMUNITY): Payer: PRIVATE HEALTH INSURANCE

## 2019-02-28 ENCOUNTER — Inpatient Hospital Stay (HOSPITAL_COMMUNITY): Payer: PRIVATE HEALTH INSURANCE | Admitting: Physical Therapy

## 2019-02-28 NOTE — Progress Notes (Signed)
Occupational Therapy Weekly Progress Note  Patient Details  Name: Denise Espinoza MRN: [id number] Date of Birth: 1929-10-23  Beginning of progress report period: Feb 21, 2019 End of progress report period: Feb 28, 2019  Today's Date: 02/28/2019 OT Individual Time: 0930-1030 OT Individual Time Calculation (min): 60 min    Patient has met 3 of 5 short term goals.  Pt is inconsistent on transfer assist. She requires mod-+2 assist depending on set-up, fatigue level, and pt's fear with transfer. She requires +2 to be present for toileting task, at times just for set-up and other times requiring one person to assist with toileting task and +2 to complete hygiene and clothing management total A.  Pt is making excellent progress towards OT goals. She is completing sit>stand with min-mod A and +2 for stand pivot transfers. Pt with UE motor return, L>R and is beginning to get to functional level.   Patient continues to demonstrate the following deficits: acute pain, cognitive deficits, muscle weakness (generalized) and quadriparesis and therefore will continue to benefit from skilled OT intervention to enhance overall performance with BADL and Reduce care partner burden.  Patient progressing toward long term goals..  Plan of care revisions: Goals upgraded to min-mod A overall. See POC for goal details. .   OT Short Term Goals Week 2:  OT Short Term Goal 1 (Week 2): Pt will consistently complete transfers with mod A +1 OT Short Term Goal 1 - Progress (Week 2): Not met OT Short Term Goal 2 (Week 2): Pt will thread B UEs into shirt with min A OT Short Term Goal 2 - Progress (Week 2): Met OT Short Term Goal 3 (Week 2): Pt will complete toileting task with total A +1 in order to decrease burden of care OT Short Term Goal 3 - Progress (Week 2): Not met OT Short Term Goal 4 (Week 2): Pt will tolerate 5 minutes of unsupported sitting with supervision in order to increase postural control/stability.  OT Short  Term Goal 4 - Progress (Week 2): Met OT Short Term Goal 5 (Week 2): Pt will stand for 2 minutes with min A for balance in prep for ADL task. OT Short Term Goal 5 - Progress (Week 2): Met Week 3:  OT Short Term Goal 1 (Week 3): Pt will complete manage pants in prep for toileting task with steadying assist in order to reduce caregiver burden OT Short Term Goal 2 (Week 3): Pt will consistently transfer with mod A +1 in order to reduce caregiver burden OT Short Term Goal 3 (Week 3): Pt will self feed 25% of meal with min A using AE PRN OT Short Term Goal 4 (Week 3): Pt will stand with steadying assist to complete hand hygiene at sink in order to increase functional standing balance and endurance  Skilled Therapeutic Interventions/Progress Updates:    Pt seen for OT ADL bathing/dressing session. Pt in supine upon arrival, denied pain and agreeable to tx session and plan for shower. She transferred to sitting EOB with min A using hospital bed functions. Ambulation throughout room during session with +2 HHA, VCs for upright posture and widening BOS. She completed toileting task seated on BSC over toilet, continent BM. Total A +1 for toileting task while pt used grab bar to assist with balance. She transitioned into shower and bathed seated on BSC in shower. Min-mod A overall to bathe. She was able to grasp washcloth in order to wash UB and upper LEs. She ambulated out of bathroom and  dressed seated in standard chair. Mod A UB dressing and mod A LB dressing with pt able to assist with threading LEs into pants. Hair care completed total A from chair level at sink. Pt unable to reach up to top of head to brush hair. She transitioned back to reclining back w/c. Left seated with all needs in reach. Education provided throughout session regarding activity progression, OT/PT goal, continuum of care and d/c planning.   Therapy Documentation Precautions:  Precautions Precautions: Fall, Cervical Precaution  Comments: Ok to doff brace in bed, when ambulating to bathroom, and while showering. C-collar applied in sitting position  Required Braces or Orthoses: Cervical Brace Cervical Brace: Hard collar Restrictions Weight Bearing Restrictions: No   Therapy/Group: Individual Therapy  Jawaun Celmer L 02/28/2019, 6:57 AM
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
## 2019-02-28 NOTE — Progress Notes (Signed)
Physical Therapy Session Note  Patient Details  Name: Denise Espinoza MRN: [id number] Date of Birth: Apr 15, 1930  Today's Date: 02/28/2019 PT Individual Time: 0488-8916 PT Individual Time Calculation (min): 41 min   Short Term Goals: Week 2:  PT Short Term Goal 1 (Week 2): Pt will complete least restrictive transfer with mod A consistently PT Short Term Goal 2 (Week 2): Pt will propel manual w/c with use of BLE x 150 ft with Supervision PT Short Term Goal 3 (Week 2): Pt will perform bed mobility with mod A consistently  Skilled Therapeutic Interventions/Progress Updates:    Pt received sitting in w/c and agreeable to therapy session. Transported to/from gym in w/c. Stand pivot transfer w/c>EOM using B UE platform RW with mod assist of 1 for balance and AD management. Pt noted to have 2 skin tears to R UE from the corner of the platform RW and RN notified and present to apply dressing. Performed repeated sit<>stand from EOM to no UE support with pt requiring mod assist for lifting and balance upon coming to standing - demonstrates poor force regulation with overshooting causing anterior lean LOB upon coming to standing and demonstrates poor motor control/coordation with impaired timing of hip/knee extension to achieve upright - cuing throughout for improvements and increased anterior trunk lean initially. Pt ambulated 44ft using platform RW with mod assist, occasional heavy mod assist, for balance and AD management as well as +2 for w/c follow - demonstrates narrow BOS and increased step length with cuing throughout for improved stance width and decreased step length. Transported back to room as pt reports being incontinent of bladder. Sit<>stand w/c<>RW with min/mod assist for lifting/balance. Static standing balance with platform RW with min assist while 2nd person performed total assist LB clothing management and pericare. Pt left sitting in w/c with needs in reach, seat belt alarm on, and meal tray  set-up.  Therapy Documentation Precautions:  Precautions Precautions: Fall, Cervical Precaution Comments: Ok to doff brace in bed, when ambulating to bathroom, and while showering. C-collar applied in sitting position  Required Braces or Orthoses: Cervical Brace Cervical Brace: Hard collar Restrictions Weight Bearing Restrictions: No  Pain: Reports she has 7/10 pain in neck but states "I'm alright" and declines intervention.    Therapy/Group: Individual Therapy  Ginny Forth, PT, DPT 02/28/2019, 7:57 AM

## 2019-02-28 NOTE — Plan of Care (Signed)
Goals upgraded due to pt progress. See POC for goal details. Dinh Ayotte, OTR/L 

## 2019-02-28 NOTE — Care Plan (Signed)
LTGs upgraded to min assist level due to progress. See Care Plan for details.

## 2019-02-28 NOTE — Progress Notes (Signed)
Physical Therapy Session Note  Patient Details  Name: Denise Espinoza MRN: [id number] Date of Birth: 07-18-1930  Today's Date: 02/28/2019 PT Individual Time: 1330-1430 PT Individual Time Calculation (min): 60 min   Short Term Goals: Week 2:  PT Short Term Goal 1 (Week 2): Pt will complete least restrictive transfer with mod A consistently PT Short Term Goal 2 (Week 2): Pt will propel manual w/c with use of BLE x 150 ft with Supervision PT Short Term Goal 3 (Week 2): Pt will perform bed mobility with mod A consistently  Skilled Therapeutic Interventions/Progress Updates:    Session focused on NMR during transfers, gait training, Kinetron in seated and standing positions, and during dynamic standing balance and coordination activities. Pt performed gait x 30' with bilateral PFRW with overall mod assist and verbal and tactile cues for upright posture and extension with +2 for safety with w/c follow. Blocked practice sit <> stands with focus on technique and decreased use of UE's for support. Also focused on technique for sit <> stand without pulling up on RW and taking arms off first before sitting down to decrease risk of skin tears. Toe taps x 10 reps each R and L to 4" step with light BUE support of tech and min to mod assist from PT with cues for upright posture and trunk extension. On kinetron engaged in bilateral LE reciprocal movement pattern retraining and strengthening from seated and standing positions (mod assist in standing). Stand pivot transfers without AD with overall mod assist throughout session with facilitation of anterior weightshift during sit <> stand and weightshifting in standing. End of session returned to bed and pt returned to supine with CGA. All needs in reach.   Therapy Documentation Precautions:  Precautions Precautions: Fall, Cervical Precaution Comments: Ok to doff brace in bed, when ambulating to bathroom, and while showering. C-collar applied in sitting position   Required Braces or Orthoses: Cervical Brace Cervical Brace: Hard collar Restrictions Weight Bearing Restrictions: No   Pain:  No complaints.     Therapy/Group: Individual Therapy  Karolee Stamps Darrol Poke, PT, DPT, CBIS  02/28/2019, 3:22 PM

## 2019-02-28 NOTE — Plan of Care (Signed)
  Problem: Education: Goal: Ability to verbalize activity precautions or restrictions will improve Description Patient will be able to verbalize safety precautions with minimal precautions.   Outcome: Progressing Goal: Knowledge of the prescribed therapeutic regimen will improve Outcome: Progressing Goal: Understanding of discharge needs will improve Outcome: Progressing   Problem: Activity: Goal: Ability to avoid complications of mobility impairment will improve Description Patient will remain free from falls.   Outcome: Progressing Goal: Ability to tolerate increased activity will improve Outcome: Progressing Goal: Will remain free from falls Outcome: Progressing   Problem: Bowel/Gastric: Goal: Gastrointestinal status for postoperative course will improve Description Patient will have BM at minimum every other day.   Outcome: Progressing   Problem: Clinical Measurements: Goal: Ability to maintain clinical measurements within normal limits will improve Description Patient VS will remain within defined limits.   Outcome: Progressing Goal: Postoperative complications will be avoided or minimized Outcome: Progressing Goal: Diagnostic test results will improve Outcome: Progressing   Problem: Skin Integrity: Goal: Will show signs of wound healing Description Patient will remain free from PI with moderate assist to turning  Outcome: Progressing   Problem: Health Behavior/Discharge Planning: Goal: Identification of resources available to assist in meeting health care needs will improve Description Patient will notify staff of need for assist with minimal assist.  Outcome: Progressing   Problem: Bladder/Genitourinary: Goal: Urinary functional status for postoperative course will improve Description Patient will notify staff in time to utilize bedpan or BSC with minimal assist.   Outcome: Progressing   Problem: Consults Goal: RH SPINAL CORD INJURY PATIENT  EDUCATION Description  See Patient Education module for education specifics.  Outcome: Progressing   Problem: SCI BOWEL ELIMINATION Goal: RH STG MANAGE BOWEL WITH ASSISTANCE Description STG Manage Bowel with max Assistance.  Outcome: Progressing Goal: RH STG SCI MANAGE BOWEL WITH MEDICATION WITH ASSISTANCE Description STG SCI Manage bowel with medication with mod assistance.  Outcome: Progressing Goal: RH STG SCI MANAGE BOWEL PROGRAM W/ASSIST OR AS APPROPRIATE Description STG SCI Manage bowel program w/total assist or as appropriate.  Outcome: Progressing   Problem: SCI BOWEL ELIMINATION Goal: RH STG SCI MANAGE BOWEL WITH MEDICATION WITH ASSISTANCE Description STG SCI Manage bowel with medication with mod assistance.  Outcome: Progressing   Problem: SCI BOWEL ELIMINATION Goal: RH STG SCI MANAGE BOWEL PROGRAM W/ASSIST OR AS APPROPRIATE Description STG SCI Manage bowel program w/total assist or as appropriate.  Outcome: Progressing   Problem: SCI BLADDER ELIMINATION Goal: RH STG MANAGE BLADDER WITH ASSISTANCE Description STG Manage Bladder With max Assistance  Outcome: Progressing Goal: RH STG SCI MANAGE BLADDER PROGRAM W/ASSISTANCE Description With max assistance  Outcome: Progressing   Problem: RH SKIN INTEGRITY Goal: RH STG SKIN FREE OF INFECTION/BREAKDOWN Outcome: Progressing Goal: RH STG MAINTAIN SKIN INTEGRITY WITH ASSISTANCE Description STG Maintain Skin Integrity With max Assistance.  Outcome: Progressing Goal: RH STG ABLE TO PERFORM INCISION/WOUND CARE W/ASSISTANCE Description STG Able To Perform Incision/Wound Care With mod Assistance.  Outcome: Progressing   Problem: RH SAFETY Goal: RH STG ADHERE TO SAFETY PRECAUTIONS W/ASSISTANCE/DEVICE Description STG Adhere to Safety Precautions With min  Assistance/Device.  Outcome: Progressing   Problem: RH PAIN MANAGEMENT Goal: RH STG PAIN MANAGED AT OR BELOW PT'S PAIN GOAL Description Rate pain < 3 on pain  scale  Outcome: Progressing

## 2019-03-01 NOTE — Progress Notes (Signed)
Urine specimen for urine culture collected and sent to lab by Michail Jewels, NT.

## 2019-03-02 ENCOUNTER — Inpatient Hospital Stay (HOSPITAL_COMMUNITY): Payer: PRIVATE HEALTH INSURANCE | Admitting: Occupational Therapy

## 2019-03-02 DIAGNOSIS — M19041 Primary osteoarthritis, right hand: Secondary | ICD-10-CM

## 2019-03-02 DIAGNOSIS — M19042 Primary osteoarthritis, left hand: Secondary | ICD-10-CM

## 2019-03-02 MED ORDER — DICLOFENAC SODIUM 1 % TD GEL
2.0000 g | Freq: Four times a day (QID) | TRANSDERMAL | Status: DC
  Administered 2019-03-02 – 2019-03-12 (×33): 2 g via TOPICAL
  Filled 2019-03-02: qty 100

## 2019-03-02 MED ORDER — SENNOSIDES-DOCUSATE SODIUM 8.6-50 MG PO TABS
2.0000 | ORAL_TABLET | Freq: Two times a day (BID) | ORAL | Status: DC
  Administered 2019-03-02 – 2019-03-12 (×20): 2 via ORAL
  Filled 2019-03-02 (×20): qty 2

## 2019-03-02 NOTE — Progress Notes (Signed)
Occupational Therapy Session Note  Patient Details  Name: Hartley Wyke MRN: [id number] Date of Birth: 19-Feb-1930  Today's Date: 03/02/2019 OT Individual Time: 1330-1400 OT Individual Time Calculation (min): 30 min    Short Term Goals: Week 1:  OT Short Term Goal 1 (Week 1): Pt will bathe 1 body area using AE as needed  OT Short Term Goal 1 - Progress (Week 1): Not met OT Short Term Goal 2 (Week 1): Pt will maintain static sitting balance for 1 minute with Mod A during self care to improve trunk control OT Short Term Goal 2 - Progress (Week 1): Met OT Short Term Goal 3 (Week 1): Pt will complete 1 grooming task w/c level at sink using AE as needed OT Short Term Goal 3 - Progress (Week 1): Not met Week 2:  OT Short Term Goal 1 (Week 2): Pt will consistently complete transfers with mod A +1 OT Short Term Goal 1 - Progress (Week 2): Not met OT Short Term Goal 2 (Week 2): Pt will thread B UEs into shirt with min A OT Short Term Goal 2 - Progress (Week 2): Met OT Short Term Goal 3 (Week 2): Pt will complete toileting task with total A +1 in order to decrease burden of care OT Short Term Goal 3 - Progress (Week 2): Not met OT Short Term Goal 4 (Week 2): Pt will tolerate 5 minutes of unsupported sitting with supervision in order to increase postural control/stability.  OT Short Term Goal 4 - Progress (Week 2): Met OT Short Term Goal 5 (Week 2): Pt will stand for 2 minutes with min A for balance in prep for ADL task. OT Short Term Goal 5 - Progress (Week 2): Met Week 3:  OT Short Term Goal 1 (Week 3): Pt will complete manage pants in prep for toileting task with steadying assist in order to reduce caregiver burden OT Short Term Goal 2 (Week 3): Pt will consistently transfer with mod A +1 in order to reduce caregiver burden OT Short Term Goal 3 (Week 3): Pt will self feed 25% of meal with min A using AE PRN OT Short Term Goal 4 (Week 3): Pt will stand with steadying assist to complete hand hygiene  at sink in order to increase functional standing balance and endurance  Skilled Therapeutic Interventions/Progress Updates:    Pt received in bed and agreeable to therapy. She stated she had been feeling very nauseated but was willing to try.  Pt sat to EOB with CGA, C collar donned for pt.  She worked on sit to stand 3x and practiced stepping to R 3 steps all with min A.  Pt continuing to feel nauseated so moved back into bed.  From bed level with HOB only slightly elevated, pt worked on controlled UE movements to develop strength in triceps and forearms/ wrists.   Pt worked on overhead elbow extensions and crossing arm to opposite shoulder and then pushing away.  On R hand she has catching of 4th and 5th digits with extension.   Pt did well with exercises. Resting in bed with all needs met and alarm set.   Therapy Documentation Precautions:  Precautions Precautions: Fall, Cervical Precaution Comments: Ok to doff brace in bed, when ambulating to bathroom, and while showering. C-collar applied in sitting position  Required Braces or Orthoses: Cervical Brace Cervical Brace: Hard collar Restrictions Weight Bearing Restrictions: No    Vital Signs: Therapy Vitals Temp: 98.5 F (36.9 C) Temp Source: Oral Pulse  Rate: 78 BP: 138/75 Patient Position (if appropriate): Lying Oxygen Therapy SpO2: 97 % O2 Device: Room Air Pain: Pain Assessment Pain Scale: 0-10 Pain Score: 4  Pain Type: Surgical pain Pain Location: Neck Pain Descriptors / Indicators: Aching Pain Onset: On-going Pain Intervention(s): Rest ADL: ADL Eating: Not assessed Grooming: Dependent Where Assessed-Grooming: Bed level Upper Body Bathing: Dependent Where Assessed-Upper Body Bathing: Edge of bed Lower Body Bathing: Other (comment)(2 assist) Where Assessed-Lower Body Bathing: Bed level Upper Body Dressing: Dependent Where Assessed-Upper Body Dressing: Bed level Lower Body Dressing: Other (Comment)(2  assist) Where Assessed-Lower Body Dressing: Bed level Toileting: Other (Comment)(2 assist) Where Assessed-Toileting: Bed level Toilet Transfer: Not assessed Tub/Shower Transfer: Not assessed      Therapy/Group: Individual Therapy  [place] 03/02/2019, 3:12 PM

## 2019-03-02 NOTE — Progress Notes (Signed)
[facility] PHYSICAL MEDICINE & REHABILITATION PROGRESS NOTE   Subjective/Complaints: Complaints of hand stiffness later in morning.  She does well with her hands in the splint.  She complains of constipation and hard stools.  ROS: Patient denies fever, rash, sore throat, blurred vision, nausea, vomiting, diarrhea, cough, shortness of breath or chest pain, joint or back pain, headache, or mood change.   Objective:   No results found. No results for input(s): WBC, HGB, HCT, PLT in the last 72 hours. No results for input(s): NA, K, CL, CO2, GLUCOSE, BUN, CREATININE, CALCIUM in the last 72 hours.  Intake/Output Summary (Last 24 hours) at 03/02/2019 1124 Last data filed at 03/02/2019 1002 Gross per 24 hour  Intake 890 ml  Output 301 ml  Net 589 ml     Physical Exam: Vital Signs Blood pressure 108/65, pulse 73, temperature 98.3 F (36.8 C), temperature source Oral, resp. rate 16, height 5' 2.99" (1.6 m), weight 60.2 kg, SpO2 95 %. Constitutional: No distress . Vital signs reviewed. HEENT: EOMI, oral membranes moist Neck: supple Cardiovascular: RRR without murmur. No JVD    Respiratory: CTA Bilaterally without wheezes or rales. Normal effort    GI: BS +, non-tender, non-distended  Musculoskeletal: Severe finger deformities with Heberden's nodules and malalignment General: C-collar Neurological: more alert.    RUE  3 to 3+/5 deltoid,biceps  to 3+ distal. LUE  3+ to 4/5 prox to 4 distal- .  RLE 4-/5 prox to distal. LLE  4/5 prox to distal. Decreased LT and pain throughout although senses gross touch and pain below shoulders bilaterally.---ongoing sensory deficits esp in UE's.     DTR's 2+ UE and 3+ LE's.  Skin: Skin iswarmand dry. Psych: pleasant   Assessment/Plan: 1. Functional deficits secondary to cervical myelopathy/central cord which require 3+ hours per day of interdisciplinary therapy in a comprehensive inpatient rehab setting.  Physiatrist is providing close team  supervision and 24 hour management of active medical problems listed below.  Physiatrist and rehab team continue to assess barriers to discharge/monitor patient progress toward functional and medical goals  Care Tool:  Bathing    Body parts bathed by patient: Right arm, Left arm, Chest, Abdomen, Front perineal area, Right upper leg, Left upper leg, Face   Body parts bathed by helper: Buttocks, Right lower leg, Left lower leg     Bathing assist Assist Level: Moderate Assistance - Patient 50 - 74%     Upper Body Dressing/Undressing Upper body dressing   What is the patient wearing?: Orthosis Orthosis activity level: Performed by helper  Upper body assist Assist Level: Moderate Assistance - Patient 50 - 74%    Lower Body Dressing/Undressing Lower body dressing      What is the patient wearing?: Pants, Incontinence brief     Lower body assist Assist for lower body dressing: 2 Helpers     Toileting Toileting    Toileting assist Assist for toileting: Total Assistance - Patient < 25%     Transfers Chair/bed transfer  Transfers assist     Chair/bed transfer assist level: Moderate Assistance - Patient 50 - 74%     Locomotion Ambulation   Ambulation assist   Ambulation activity did not occur: Safety/medical concerns  Assist level: 2 helpers(w/c follow) Assistive device: Walker-platform Max distance: 20ft    Walk 10 feet activity   Assist  Walk 10 feet activity did not occur: Safety/medical concerns  Assist level: 2 helpers Assistive device: Walker-platform   Walk 50 feet activity   Assist  Walk 50 feet with 2 turns activity did not occur: Safety/medical concerns         Walk 150 feet activity   Assist Walk 150 feet activity did not occur: Safety/medical concerns         Walk 10 feet on uneven surface  activity   Assist Walk 10 feet on uneven surfaces activity did not occur: Safety/medical concerns         Wheelchair     Assist  Will patient use wheelchair at discharge?: Yes Type of Wheelchair: Manual    Wheelchair assist level: Minimal Assistance - Patient > 75% Max wheelchair distance: 100'    Wheelchair 50 feet with 2 turns activity    Assist        Assist Level: Minimal Assistance - Patient > 75%   Wheelchair 150 feet activity     Assist     Assist Level: Dependent - Patient 0%    Medical Problem List and Plan: 1.Functional and mobility deficitssecondary to C2 fracture and 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 Pt is s/p C1 laminectomy, C2 ORIF and Occiput to C4 PLIF -Continue CIR therapies including PT, OT   -bilateral WHO's   -remains quite motivated especially with recent neurological improvement 2. Antithrombotics: -DVT/anticoagulation:Mechanical:Sequential compression devices, below kneeBilateral lower extremities. Dopplers negative -antiplatelet therapy: N/A 3. Pain Management:Oxycodone prn--premedicate prior to therapy. Gabapentin tid for neuropathy Osteoarthritis with severe finger deformities will trial diclofenac gel 4. Mood:Team to provide ego support. LCSW to follow for evaluation and support. -antipsychotic agents: N/A 5. Neuropsych: This patientiscapable of making decisions on herown behalf. 6. Skin/Wound Care:Will order air matress overlay for pressure relief. 7. Fluids/Electrolytes/Nutrition:encourage PO 8. H/o FUX:NATFTDDU metoprolol daily 9. Neurogenic bowel:   -improving continence  10. Neurogenic bladder:Has been incontinent for years.   -time toileting to help improve continence 11. Fever: afebrile over 72 hours  -no cough, diarrhea, suspicious skin areas  -cbc, bmet, cxr  Negative.  surgical site remains CDI  -  UA +, UCX with multispecies--repeat UCX Proteus mirabilis 80K sensitivity to follow, has completed Bactrim  - bc negative but still pending.   -dopplers negative  -encourage IS, OOB as  we've been doing  -wbc's 7.7---recheck Monday  Empiric bactrim completed course 12.  Constipation will increase senna S to 2 tablets twice a day LOS: 15 days A FACE TO FACE EVALUATION WAS PERFORMED  Erick Colace 03/02/2019, 11:24 AM

## 2019-03-02 NOTE — Plan of Care (Signed)
  Problem: SCI BOWEL ELIMINATION Goal: RH STG MANAGE BOWEL WITH ASSISTANCE Description STG Manage Bowel with max Assistance.  Outcome: Not Progressing; bowel program   Problem: SCI BLADDER ELIMINATION Goal: RH STG MANAGE BLADDER WITH ASSISTANCE Description STG Manage Bladder With max Assistance  Outcome: Not Progressing; incontinence at times

## 2019-03-03 ENCOUNTER — Inpatient Hospital Stay (HOSPITAL_COMMUNITY): Payer: PRIVATE HEALTH INSURANCE | Admitting: Occupational Therapy

## 2019-03-03 ENCOUNTER — Inpatient Hospital Stay (HOSPITAL_COMMUNITY): Payer: PRIVATE HEALTH INSURANCE | Admitting: Physical Therapy

## 2019-03-03 LAB — CBC
HCT: 30.9 % — ABNORMAL LOW (ref 36.0–46.0)
Hemoglobin: 10.1 g/dL — ABNORMAL LOW (ref 12.0–15.0)
MCH: 30.3 pg (ref 26.0–34.0)
MCHC: 32.7 g/dL (ref 30.0–36.0)
MCV: 92.8 fL (ref 80.0–100.0)
Platelets: 184 10*3/uL (ref 150–400)
RBC: 3.33 MIL/uL — ABNORMAL LOW (ref 3.87–5.11)
RDW: 13.4 % (ref 11.5–15.5)
WBC: 5.1 10*3/uL (ref 4.0–10.5)
nRBC: 0 % (ref 0.0–0.2)

## 2019-03-03 LAB — BASIC METABOLIC PANEL
Anion gap: 11 (ref 5–15)
BUN: 15 mg/dL (ref 8–23)
CO2: 27 mmol/L (ref 22–32)
Calcium: 9.1 mg/dL (ref 8.9–10.3)
Chloride: 99 mmol/L (ref 98–111)
Creatinine, Ser: 0.59 mg/dL (ref 0.44–1.00)
GFR calc Af Amer: >60 mL/min (ref >60)
GFR calc non Af Amer: >60 mL/min (ref >60)
Glucose, Bld: 98 mg/dL (ref 70–99)
Potassium: 3.7 mmol/L (ref 3.5–5.1)
Sodium: 137 mmol/L (ref 135–145)

## 2019-03-03 LAB — URINE CULTURE
Culture: 80000 — AB
Special Requests: NORMAL

## 2019-03-03 MED ORDER — SULFAMETHOXAZOLE-TRIMETHOPRIM 400-80 MG PO TABS
1.0000 | ORAL_TABLET | Freq: Two times a day (BID) | ORAL | Status: DC
  Administered 2019-03-03 – 2019-03-05 (×5): 1 via ORAL
  Filled 2019-03-03 (×7): qty 1

## 2019-03-03 NOTE — Progress Notes (Signed)
Physical Therapy Weekly Progress Note  Patient Details  Name: Rosilyn Coachman MRN: [id number] Date of Birth: 1930-03-23  Beginning of progress report period: Feb 24, 2019 End of progress report period: Mar 03, 2019  Today's Date: 03/03/2019 PT Individual Time: 1430-1530 PT Individual Time Calculation (min): 60 min   Patient has met 3 of 3 short term goals.  Pt continues to make great functional gains in therapy. Pt is currently min to mod A for bed mobility, mod A for stand pivot transfers, and mod A for short distance gait with a RW or a PFRW with a close w/c follow for safety. Pt is also able to propel a manual w/c with use of BUE and Supervision.   Patient continues to demonstrate the following deficits muscle weakness, impaired timing and sequencing, abnormal tone, unbalanced muscle activation and decreased coordination and decreased sitting balance, decreased standing balance, decreased postural control and decreased balance strategies and therefore will continue to benefit from skilled PT intervention to increase functional independence with mobility.  Patient progressing toward long term goals..  Continue plan of care.  PT Short Term Goals Week 2:  PT Short Term Goal 1 (Week 2): Pt will complete least restrictive transfer with mod A consistently PT Short Term Goal 1 - Progress (Week 2): Met PT Short Term Goal 2 (Week 2): Pt will propel manual w/c with use of BLE x 150 ft with Supervision PT Short Term Goal 2 - Progress (Week 2): Met PT Short Term Goal 3 (Week 2): Pt will perform bed mobility with mod A consistently PT Short Term Goal 3 - Progress (Week 2): Met Week 3:  PT Short Term Goal 1 (Week 3): =LTG due to ELOS  Skilled Therapeutic Interventions/Progress Updates:  Pt received seated in w/c in room, reports feeling fatigued from sitting up all day but agreeable to therapy session. Stand pivot transfer w/c to mat table with mod A. Sit to stand with mod a to [place]. Pt is able to take  a few steps forward/back with PFRW and mod A, v/c for upright posture as pt tends to flex trunk and knees in stance. Set pt up with a regular RW with a R hand splint for improved grip. Ambulation 2 x 25' with R with R hand orthosis and min A x 2, multimodal cueing for RLE placement. Pt reports she has urinated during gait training. Stand pivot transfer back to bed with mod A. Sit to supine mod A for BLE management. Rolling L/R with CGA for dependent brief change and pericare. Pt left semi-reclined in bed with needs in reach, bed alarm in place at end of session.  Therapy Documentation Precautions:  Precautions Precautions: Fall, Cervical Precaution Comments: Ok to doff brace in bed, when ambulating to bathroom, and while showering. C-collar applied in sitting position  Required Braces or Orthoses: Cervical Brace Cervical Brace: Hard collar Restrictions Weight Bearing Restrictions: No   Therapy/Group: Individual Therapy   Excell Seltzer, PT, DPT  03/03/2019, 4:08 PM

## 2019-03-03 NOTE — Progress Notes (Signed)
Occupational Therapy Session Note  Patient Details  Name: Denise Espinoza MRN: [id number] Date of Birth: 09-11-1930  Today's Date: 03/03/2019 OT Individual Time: 1230-1340 OT Individual Time Calculation (min): 70 min   Short Term Goals: Week 3:  OT Short Term Goal 1 (Week 3): Pt will complete manage pants in prep for toileting task with steadying assist in order to reduce caregiver burden OT Short Term Goal 2 (Week 3): Pt will consistently transfer with mod A +1 in order to reduce caregiver burden OT Short Term Goal 3 (Week 3): Pt will self feed 25% of meal with min A using AE PRN OT Short Term Goal 4 (Week 3): Pt will stand with steadying assist to complete hand hygiene at sink in order to increase functional standing balance and endurance  Skilled Therapeutic Interventions/Progress Updates:    Pt greeted in w/c. Lunch tray was in front of her but table was too high for her to feed herself. OT lowered table, and for remainder of session tx focus was placed on UE NMR via eating. Pt required assist to adjust lateral pinch grasp when managing regular utensils. HOH for cutting up her tilapia while Rt hand speared fish. Pt able to scoop and spear food using L UE at dominant level given increased time. She brought food to mouth with assist at elbow only 5% of the time. Bilateral integration when managing beverages with vcs. Assist provided when using R UE to stabilize cobbler dish. Pt then completed handwashing with Min A. She was left in w/c with all needs within reach and safety belt fastened.   Therapy Documentation Precautions:  Precautions Precautions: Fall, Cervical Precaution Comments: Ok to doff brace in bed, when ambulating to bathroom, and while showering. C-collar applied in sitting position  Required Braces or Orthoses: Cervical Brace Cervical Brace: Hard collar Restrictions Weight Bearing Restrictions: No Vital Signs: Therapy Vitals Temp: 99.6 F (37.6 C) Temp Source: Oral Pulse  Rate: 71 Resp: 14 BP: 106/64 Patient Position (if appropriate): Sitting Oxygen Therapy SpO2: 96 % O2 Device: Room Air Pain: No s/s pain during tx Pain Assessment Pain Scale: Faces Faces Pain Scale: Hurts little more Pain Type: Chronic pain Pain Location: Back Pain Orientation: Lower Pain Descriptors / Indicators: Aching Pain Frequency: Constant Pain Onset: On-going Pain Intervention(s): Medication (See eMAR)(scheduled) ADL: ADL Eating: Not assessed Grooming: Dependent Where Assessed-Grooming: Bed level Upper Body Bathing: Dependent Where Assessed-Upper Body Bathing: Edge of bed Lower Body Bathing: Other (comment)(2 assist) Where Assessed-Lower Body Bathing: Bed level Upper Body Dressing: Dependent Where Assessed-Upper Body Dressing: Bed level Lower Body Dressing: Other (Comment)(2 assist) Where Assessed-Lower Body Dressing: Bed level Toileting: Other (Comment)(2 assist) Where Assessed-Toileting: Bed level Toilet Transfer: Not assessed Tub/Shower Transfer: Not assessed      Therapy/Group: Individual Therapy  Denise Espinoza 03/03/2019, 4:00 PM

## 2019-03-03 NOTE — Progress Notes (Signed)
[facility] PHYSICAL MEDICINE & REHABILITATION PROGRESS NOTE   Subjective/Complaints: Feels that she needs to move bowels this morning. Overall progressing functionally  ROS: Patient denies fever, rash, sore throat, blurred vision, nausea, vomiting, diarrhea, cough, shortness of breath or chest pain,   headache, or mood change.    Objective:   No results found. Recent Labs    03/03/19 0557  WBC 5.1  HGB 10.1*  HCT 30.9*  PLT 184   Recent Labs    03/03/19 0557  NA 137  K 3.7  CL 99  CO2 27  GLUCOSE 98  BUN 15  CREATININE 0.59  CALCIUM 9.1    Intake/Output Summary (Last 24 hours) at 03/03/2019 1127 Last data filed at 03/03/2019 0851 Gross per 24 hour  Intake 1042 ml  Output 300 ml  Net 742 ml     Physical Exam: Vital Signs Blood pressure 101/68, pulse 90, temperature 98.7 F (37.1 C), temperature source Oral, resp. rate 14, height 5' 2.99" (1.6 m), weight 60.2 kg, SpO2 94 %. Constitutional: No distress . Vital signs reviewed. HEENT: EOMI, oral membranes moist Neck: supple Cardiovascular: RRR without murmur. No JVD    Respiratory: CTA Bilaterally without wheezes or rales. Normal effort    GI: BS +, non-tender, non-distended  Musculoskeletal:  General: C-collar Neurological:  Alert, normal insight and awareness. .    RUE  3 to 3+/5 deltoid,biceps  to 3+ distal. LUE  3+ to 4/5 prox to 4 distal- .  RLE 4-/5 prox to distal. LLE  4/5 prox to distal. Decreased LT and pain throughout although senses gross touch and pain below shoulders bilaterally.---ongoing sensory deficits esp in UE's.     DTR's 2+ UE and 3+ LE's.  Skin: Skin iswarmand dry. Psych: pleasant and interactive   Assessment/Plan: 1. Functional deficits secondary to cervical myelopathy/central cord which require 3+ hours per day of interdisciplinary therapy in a comprehensive inpatient rehab setting.  Physiatrist is providing close team supervision and 24 hour management of active medical  problems listed below.  Physiatrist and rehab team continue to assess barriers to discharge/monitor patient progress toward functional and medical goals  Care Tool:  Bathing    Body parts bathed by patient: Right arm, Left arm, Chest, Abdomen, Front perineal area, Right upper leg, Left upper leg, Face   Body parts bathed by helper: Buttocks, Right lower leg, Left lower leg     Bathing assist Assist Level: Moderate Assistance - Patient 50 - 74%     Upper Body Dressing/Undressing Upper body dressing   What is the patient wearing?: Orthosis, Button up shirt Orthosis activity level: Performed by helper  Upper body assist Assist Level: Moderate Assistance - Patient 50 - 74%    Lower Body Dressing/Undressing Lower body dressing      What is the patient wearing?: Pants, Incontinence brief     Lower body assist Assist for lower body dressing: Minimal Assistance - Patient > 75%     Toileting Toileting    Toileting assist Assist for toileting: Maximal Assistance - Patient 25 - 49%     Transfers Chair/bed transfer  Transfers assist     Chair/bed transfer assist level: Moderate Assistance - Patient 50 - 74%     Locomotion Ambulation   Ambulation assist   Ambulation activity did not occur: Safety/medical concerns  Assist level: 2 helpers(w/c follow) Assistive device: Walker-platform Max distance: 73ft    Walk 10 feet activity   Assist  Walk 10 feet activity did not occur: Safety/medical  concerns  Assist level: 2 helpers Assistive device: Walker-platform   Walk 50 feet activity   Assist Walk 50 feet with 2 turns activity did not occur: Safety/medical concerns         Walk 150 feet activity   Assist Walk 150 feet activity did not occur: Safety/medical concerns         Walk 10 feet on uneven surface  activity   Assist Walk 10 feet on uneven surfaces activity did not occur: Safety/medical concerns         Wheelchair     Assist Will  patient use wheelchair at discharge?: Yes Type of Wheelchair: Manual    Wheelchair assist level: Minimal Assistance - Patient > 75% Max wheelchair distance: 100'    Wheelchair 50 feet with 2 turns activity    Assist        Assist Level: Minimal Assistance - Patient > 75%   Wheelchair 150 feet activity     Assist     Assist Level: Dependent - Patient 0%    Medical Problem List and Plan: 1.Functional and mobility deficitssecondary to C2 fracture and 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 Pt is s/p C1 laminectomy, C2 ORIF and Occiput to C4 PLIF -Continue CIR therapies including PT, OT   -bilateral WHO's     2. Antithrombotics: -DVT/anticoagulation:Mechanical:Sequential compression devices, below kneeBilateral lower extremities. Dopplers negative -antiplatelet therapy: N/A 3. Pain Management:Oxycodone prn--premedicate prior to therapy. Gabapentin tid for neuropathy 4. Mood:Team to provide ego support. LCSW to follow for evaluation and support. -antipsychotic agents: N/A 5. Neuropsych: This patientiscapable of making decisions on herown behalf. 6. Skin/Wound Care:Will order air matress overlay for pressure relief. 7. Fluids/Electrolytes/Nutrition:encourage PO 8. H/o PQA:ESLPNPYY metoprolol daily 9. Neurogenic bowel:   -improving continence  10. Neurogenic bladder:Has been incontinent for years.   -time toileting to help improve continence 11. Fever: afebrile   -no cough, diarrhea, suspicious skin areas  -cbc, bmet, cxr  Negative.  surgical site remains CDI  -  UA +, UCX with multispecies--repeat UCX from 5/23 came back with proteus 80k S to bactrim  - bc negative     -resume bactrim for 5 day course to cover proteus in urine LOS: 16 days A FACE TO FACE EVALUATION WAS PERFORMED  Ranelle Oyster 03/03/2019, 11:27 AM

## 2019-03-03 NOTE — Progress Notes (Signed)
Occupational Therapy Session Note  Patient Details  Name: Denise Espinoza MRN: [id number] Date of Birth: [birth year]  Today's Date: 03/03/2019 OT Individual Time: 6283-1517 OT Individual Time Calculation (min): 59 min    Short Term Goals: Week 3:  OT Short Term Goal 1 (Week 3): Pt will complete manage pants in prep for toileting task with steadying assist in order to reduce caregiver burden OT Short Term Goal 2 (Week 3): Pt will consistently transfer with mod A +1 in order to reduce caregiver burden OT Short Term Goal 3 (Week 3): Pt will self feed 25% of meal with min A using AE PRN OT Short Term Goal 4 (Week 3): Pt will stand with steadying assist to complete hand hygiene at sink in order to increase functional standing balance and endurance  Skilled Therapeutic Interventions/Progress Updates:    Pt seen for OT session focusing on ADL re-training. Pt awake in supine upon arrival, awake and agreeable to tx session. She reports having gotten suppository with no results and wanted to eat breakfast. Pt agreeable to transferring to [facility] and then eat breakfast. Transferred to EOB with min A with VCs for technique. Mod-max A sit>stand from EOB due to strong posterior lean. Pt demonstrated difficulty problem solving and motor planning stand pivot transfer to [facility] requiring max multimodal cuing for sequencing. She ate breakfast with set-up assist to open containers and cut food, able to use standard silverware to self-feed.  She stood with min A to PFRW with assist to place UEs on support. Total A for toileting task while pt maintained static standing balance with min A while care provided with VCs for upright posture.  She dressed seated on BSC, max A for donning zip up jacket. With min steadying assist and increased time, pt able to lean forward to thread LEs into pants. She stood with PFRW in same manner as described above while therapist assisted with pants. Stand pivot to return to bed using PFRW with max  cuing for technique and assist to manage RW. Stand pivot to reclining back w/c. Pt left in reclined w/c at end of session, all needs in reach.   Therapy Documentation Precautions:  Precautions Precautions: Fall, Cervical Precaution Comments: Ok to doff brace in bed, when ambulating to bathroom, and while showering. C-collar applied in sitting position  Required Braces or Orthoses: Cervical Brace Cervical Brace: Hard collar Restrictions Weight Bearing Restrictions: No   Therapy/Group: Individual Therapy  Samanthajo Payano L 03/03/2019, 7:01 AM

## 2019-03-04 ENCOUNTER — Inpatient Hospital Stay (HOSPITAL_COMMUNITY): Payer: PRIVATE HEALTH INSURANCE | Admitting: Physical Therapy

## 2019-03-04 ENCOUNTER — Inpatient Hospital Stay (HOSPITAL_COMMUNITY): Payer: PRIVATE HEALTH INSURANCE | Admitting: Occupational Therapy

## 2019-03-04 ENCOUNTER — Inpatient Hospital Stay (HOSPITAL_COMMUNITY): Payer: PRIVATE HEALTH INSURANCE

## 2019-03-04 NOTE — Progress Notes (Signed)
Occupational Therapy Session Note  Patient Details  Name: Denise Espinoza MRN: [id number] Date of Birth: [birth year]  Today's Date: 03/04/2019 OT Individual Time: 1100-1200 OT Individual Time Calculation (min): 60 min    Short Term Goals: Week 3:  OT Short Term Goal 1 (Week 3): Pt will complete manage pants in prep for toileting task with steadying assist in order to reduce caregiver burden OT Short Term Goal 2 (Week 3): Pt will consistently transfer with mod A +1 in order to reduce caregiver burden OT Short Term Goal 3 (Week 3): Pt will self feed 25% of meal with min A using AE PRN OT Short Term Goal 4 (Week 3): Pt will stand with steadying assist to complete hand hygiene at sink in order to increase functional standing balance and endurance  Skilled Therapeutic Interventions/Progress Updates:    Pt received sitting up in highback, reclining w/c, c/o neck stiffness from chair position. Pt was reclined back and she reported pain alleviating. Session was completed alongside recreational therapist, Lattie Haw. Pt was transported to ADL apartment to address kitchen mobility, energy conservation, functional reaching and safety awareness. Pt reported previous skin tear from platform RW so coband was obtained and potentially damaging areas wrapped. Pt completed sit > stand with min A from w/c with cueing required for UE placement. Pt completed functional mobility to counter with cueing for positioning. Pt completed functional reaching with BUE, simulating making a fruit salad to address B hand gross grasp and shoulder mobility. Pt required several seated rest breaks at counter with a stool. Pt completed functional reaching into cabinets at > eye level to challenge shoulder flexion bilaterally. Pt cued throughout/educatoin provided on increasing accessibility at home and engaging in previously enjoyed tasks. Pt required intermittent stability/cueing at her R knee during standing.  Pt ended session with 15 ft of  functional mobility with min-mod A. Pt was left sitting up in w/c with all needs met, chair recliner and feet elevated to reduce pressure/strain through neck.   Therapy Documentation Precautions:  Precautions Precautions: Fall, Cervical Precaution Comments: Ok to doff brace in bed, when ambulating to bathroom, and while showering. C-collar applied in sitting position  Required Braces or Orthoses: Cervical Brace Cervical Brace: Hard collar Restrictions Weight Bearing Restrictions: No   Therapy/Group: Individual Therapy  Curtis Sites 03/04/2019, 12:49 PM

## 2019-03-04 NOTE — Progress Notes (Signed)
Physical Therapy Session Note  Patient Details  Name: Denise Espinoza MRN: [id number] Date of Birth: June 14, 1930  Today's Date: 03/04/2019 PT Individual Time: 1430-1525 PT Individual Time Calculation (min): 55 min   Short Term Goals: Week 3:  PT Short Term Goal 1 (Week 3): =LTG due to ELOS  Skilled Therapeutic Interventions/Progress Updates:    Pt received reclined in w/c in room, agreeable to PT session. Pt reports some neck soreness, not rated. Stand pivot transfer from high-back w/c to standard w/c with min A. Manual w/c propulsion x 150 ft with use of BLE and Supervision. Sit to stand with min A to RW. Ambulation 2 x 50 ft with RW with R hand orthosis and mod A for balance and RW management, w/c follow for safety. Pt reports having urinary incontinence. Stand pivot transfer back to bed with min A. Sit to supine with CGA. Rolling L/R with CGA and use of bedrails for dependent brief change and pericare. Pt left semi-reclined in bed with needs in reach, bed alarm in place at end of session.  Therapy Documentation Precautions:  Precautions Precautions: Fall, Cervical Precaution Comments: Ok to doff brace in bed, when ambulating to bathroom, and while showering. C-collar applied in sitting position  Required Braces or Orthoses: Cervical Brace Cervical Brace: Hard collar Restrictions Weight Bearing Restrictions: No    Therapy/Group: Individual Therapy   Peter Congo, PT, DPT  03/04/2019, 3:51 PM

## 2019-03-04 NOTE — Progress Notes (Signed)
Occupational Therapy Session Note  Patient Details  Name: Denise Espinoza MRN: [id number] Date of Birth: [birth year]  Today's Date: 03/04/2019 OT Individual Time: 0845-1000 OT Individual Time Calculation (min): 75 min    Short Term Goals: Week 3:  OT Short Term Goal 1 (Week 3): Pt will complete manage pants in prep for toileting task with steadying assist in order to reduce caregiver burden OT Short Term Goal 2 (Week 3): Pt will consistently transfer with mod A +1 in order to reduce caregiver burden OT Short Term Goal 3 (Week 3): Pt will self feed 25% of meal with min A using AE PRN OT Short Term Goal 4 (Week 3): Pt will stand with steadying assist to complete hand hygiene at sink in order to increase functional standing balance and endurance  Skilled Therapeutic Interventions/Progress Updates:    Pt seen for OT ADL bathing/dressing session. Pt in supine upon arrival, having just finished breakfast and agreeable to tx session, pt denying pain.  She transferred to sitting EOB with min A using hospital bed functions and VCs for technique. Voiced need for toileting task. Min-mod A stand pivot transfer to [facility], with steadying assist, pt able to pull brief down in prep for toileting task, total A for hygiene following BM. x2 toileting events during session each completed with assist as noted above.  She ambulated with +2 min HHA into bathroom. She bathed seated on tub transfer bench in shower, requiring assist for reaching LEs and buttock. She was able to bathe rest of body with intermittent assist to maintain grasp on washcloth. She ambulated out of bathroom in same manner as described above. Pt able to assist with brushing hair, maintaining functional grasp on comb. Increased assist required for dressing 2/2 time constraints. She was able to complete sit>stand from chair to sink with CGA and maintain static standing balance ~3 minutes while care provided with intermittent cuing for upright posture.  Pt  left seated in reclining back w/c at end of session, all needs in reach.   Therapy Documentation Precautions:  Precautions Precautions: Fall, Cervical Precaution Comments: Ok to doff brace in bed, when ambulating to bathroom, and while showering. C-collar applied in sitting position  Required Braces or Orthoses: Cervical Brace Cervical Brace: Hard collar Restrictions Weight Bearing Restrictions: No   Therapy/Group: Individual Therapy  Alfred Eckley L 03/04/2019, 6:59 AM

## 2019-03-04 NOTE — Progress Notes (Signed)
[place] PHYSICAL MEDICINE & REHABILITATION PROGRESS NOTE   Subjective/Complaints: No new problems over night. Improving UE motor, sensory still quite affected  ROS: Patient denies fever, rash, sore throat, blurred vision, nausea, vomiting, diarrhea, cough, shortness of breath or chest pain,  headache, or mood change.     Objective:   No results found. Recent Labs    03/03/19 0557  WBC 5.1  HGB 10.1*  HCT 30.9*  PLT 184   Recent Labs    03/03/19 0557  NA 137  K 3.7  CL 99  CO2 27  GLUCOSE 98  BUN 15  CREATININE 0.59  CALCIUM 9.1    Intake/Output Summary (Last 24 hours) at 03/04/2019 0855 Last data filed at 03/03/2019 1700 Gross per 24 hour  Intake 360 ml  Output -  Net 360 ml     Physical Exam: Vital Signs Blood pressure 110/61, pulse 70, temperature 98.4 F (36.9 C), temperature source Oral, resp. rate 17, height 5' 2.99" (1.6 m), weight 61.4 kg, SpO2 96 %. Constitutional: No distress . Vital signs reviewed. HEENT: EOMI, oral membranes moist Neck: supple Cardiovascular: RRR without murmur. No JVD    Respiratory: CTA Bilaterally without wheezes or rales. Normal effort    GI: BS +, non-tender, non-distended  Musculoskeletal:  General: C-collar Neurological:  Alert, normal insight and awareness. .    RUE  3 to 3+/5 deltoid,biceps  to 3+ distal. LUE  3+ to 4/5 prox to 4 distal- .  RLE 4-/5 prox to distal. LLE  4/5 prox to distal. Decreased LT and pain throughout although senses gross touch and pain below shoulders bilaterally.---ongoing sensory deficits esp in UE's without sig change.     DTR's 2+ UE and 3+ LE's.  Skin: Skin iswarmand dry. Psych: pleasant   Assessment/Plan: 1. Functional deficits secondary to cervical myelopathy/central cord which require 3+ hours per day of interdisciplinary therapy in a comprehensive inpatient rehab setting.  Physiatrist is providing close team supervision and 24 hour management of active medical problems  listed below.  Physiatrist and rehab team continue to assess barriers to discharge/monitor patient progress toward functional and medical goals  Care Tool:  Bathing    Body parts bathed by patient: Right arm, Left arm, Chest, Abdomen, Front perineal area, Right upper leg, Left upper leg, Face   Body parts bathed by helper: Buttocks, Right lower leg, Left lower leg     Bathing assist Assist Level: Moderate Assistance - Patient 50 - 74%     Upper Body Dressing/Undressing Upper body dressing   What is the patient wearing?: Orthosis Orthosis activity level: Performed by helper  Upper body assist Assist Level: Moderate Assistance - Patient 50 - 74%    Lower Body Dressing/Undressing Lower body dressing      What is the patient wearing?: Pants, Incontinence brief     Lower body assist Assist for lower body dressing: Minimal Assistance - Patient > 75%     Toileting Toileting    Toileting assist Assist for toileting: Maximal Assistance - Patient 25 - 49%     Transfers Chair/bed transfer  Transfers assist     Chair/bed transfer assist level: Moderate Assistance - Patient 50 - 74%     Locomotion Ambulation   Ambulation assist   Ambulation activity did not occur: Safety/medical concerns  Assist level: 2 helpers Assistive device: Walker-rolling Max distance: 25'   Walk 10 feet activity   Assist  Walk 10 feet activity did not occur: Safety/medical concerns  Assist level: 2  helpers Assistive device: Walker-rolling   Walk 50 feet activity   Assist Walk 50 feet with 2 turns activity did not occur: Safety/medical concerns         Walk 150 feet activity   Assist Walk 150 feet activity did not occur: Safety/medical concerns         Walk 10 feet on uneven surface  activity   Assist Walk 10 feet on uneven surfaces activity did not occur: Safety/medical concerns         Wheelchair     Assist Will patient use wheelchair at discharge?:  Yes Type of Wheelchair: Manual    Wheelchair assist level: Minimal Assistance - Patient > 75% Max wheelchair distance: 100'    Wheelchair 50 feet with 2 turns activity    Assist        Assist Level: Minimal Assistance - Patient > 75%   Wheelchair 150 feet activity     Assist     Assist Level: Dependent - Patient 0%    Medical Problem List and Plan: 1.Functional and mobility deficitssecondary to C2 fracture and 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 Pt is s/p C1 laminectomy, C2 ORIF and Occiput to C4 PLIF -Continue CIR therapies including PT, OT, team conf today   -bilateral WHO's     2. Antithrombotics: -DVT/anticoagulation:Mechanical:Sequential compression devices, below kneeBilateral lower extremities. Dopplers negative -antiplatelet therapy: N/A 3. Pain Management:Oxycodone prn--premedicate prior to therapy. Gabapentin tid for neuropathy 4. Mood:Team to provide ego support. LCSW to follow for evaluation and support. -antipsychotic agents: N/A 5. Neuropsych: This patientiscapable of making decisions on herown behalf. 6. Skin/Wound Care:Will order air matress overlay for pressure relief. 7. Fluids/Electrolytes/Nutrition:encourage PO 8. H/o IRW:[id number] metoprolol daily 9. Neurogenic bowel:   -improving continence  10. Neurogenic bladder:Has been incontinent for years.   -time toileting to help improve continence 11. Fever: afebrile   -no cough, diarrhea, suspicious skin areas  -cbc, bmet, cxr  Negative.  surgical site remains CDI  -  UA +, UCX with multispecies--repeat UCX from 5/23 came back with proteus 80k S to bactrim  - bc negative     -resume bactrim for 5 day course (5/25) to cover proteus in urine LOS: 17 days A FACE TO FACE EVALUATION WAS PERFORMED  Ranelle Oyster 03/04/2019, 8:55 AM

## 2019-03-04 NOTE — Progress Notes (Signed)
Recreational Therapy Session Note  Patient Details  Name: Denise Espinoza MRN: [id number] Date of Birth: Sep 19, 1930 Today's Date: 03/04/2019 Time:  1115-12 Pain: no c/o Skilled Therapeutic Interventions/Progress Updates:  Goal:  Pt will maintain dynamic standing balance for simple-mod complex TR tasks with min assist.  Session focused on dynamic standing balance during co-treat with OT.  PT enjoys cooking so session took place in the patient kitchen.  Pt stood with PFRW and ambulated ~5' to kitchen counter.  Once at the counter, pt used 1UE to simulate making a fruit salad.  Also stood at the counter to place items on shoulder height shelf with min assist, verbal cues for posture and knee control.  Pt also ambulate with PFRW ~10" with min-mod assist of 1. Therapy/Group: Co-Treatment  Jahfari Ambers 03/04/2019, 2:09 PM

## 2019-03-05 ENCOUNTER — Inpatient Hospital Stay (HOSPITAL_COMMUNITY): Payer: PRIVATE HEALTH INSURANCE

## 2019-03-05 ENCOUNTER — Inpatient Hospital Stay (HOSPITAL_COMMUNITY): Payer: PRIVATE HEALTH INSURANCE | Admitting: Physical Therapy

## 2019-03-05 ENCOUNTER — Inpatient Hospital Stay (HOSPITAL_COMMUNITY): Payer: PRIVATE HEALTH INSURANCE | Admitting: Occupational Therapy

## 2019-03-05 LAB — BASIC METABOLIC PANEL
Anion gap: 10 (ref 5–15)
BUN: 19 mg/dL (ref 8–23)
CO2: 26 mmol/L (ref 22–32)
Calcium: 8.9 mg/dL (ref 8.9–10.3)
Chloride: 101 mmol/L (ref 98–111)
Creatinine, Ser: 0.79 mg/dL (ref 0.44–1.00)
GFR calc Af Amer: >60 mL/min (ref >60)
GFR calc non Af Amer: >60 mL/min (ref >60)
Glucose, Bld: 108 mg/dL — ABNORMAL HIGH (ref 70–99)
Potassium: 4.7 mmol/L (ref 3.5–5.1)
Sodium: 137 mmol/L (ref 135–145)

## 2019-03-05 LAB — NOVEL CORONAVIRUS, NAA (HOSP ORDER, SEND-OUT TO REF LAB; TAT 18-24 HRS): SARS-CoV-2, NAA: NOT DETECTED

## 2019-03-05 MED ORDER — SULFAMETHOXAZOLE-TRIMETHOPRIM 800-160 MG PO TABS
1.0000 | ORAL_TABLET | Freq: Two times a day (BID) | ORAL | Status: DC
  Administered 2019-03-05 – 2019-03-12 (×14): 1 via ORAL
  Filled 2019-03-05 (×16): qty 1

## 2019-03-05 NOTE — Progress Notes (Signed)
Physical Therapy Session Note  Patient Details  Name: Denise Espinoza MRN: [id number] Date of Birth: 05/05/1930  Today's Date: 03/05/2019 PT Individual Time: 1500-1540 PT Individual Time Calculation (min): 40 min   Short Term Goals: Week 3:  PT Short Term Goal 1 (Week 3): =LTG due to ELOS  Skilled Therapeutic Interventions/Progress Updates:     Patient in bed upon PT arrival. Patient alert and agreeable to PT session. Patient stated she needed to have her brief changed at beginning of session. C-collar was donned at beginning of session in supine. B TEDs were donned prior to session.  Therapeutic Activity: Bed Mobility: Patient performed bridging x4 for changing brief with total A for peri-care and LB dressing. She performed supine to sit with min A for trunk support and sit to supine with CGA for safety. Provided verbal cues for log rolling technique to maintian spinal precautions. Reported mild dizziness in sitting that resolved in <20 seconds in sitting.  Transfers: Patient performed stand pivot transfers x2 with mod-min A without AD. Provided verbal cues for leaning forward to stand and reaching back to sit. During first transfer the patient's B knees buckled and she required mod A to sit in the w/c.   Patient reported she felt light headed during the transfer. BP 88/50 HR 77 following transfer in sitting. Patient sat for 2 minutes to brush her hair, BP 104/54 HR 77. Took BP in standing during second transfer back to bed and dropped to 54/34 HR 90. Returned patient to supine with B LE elevated and BP 118/53 HR 72. RN made aware of orthostatic BP in standing at end of session.   Patient in bed at end of session with breaks locked, bed alarm set, and all needs within reach.    Therapy Documentation Precautions:  Precautions Precautions: Fall, Cervical Precaution Comments: Ok to doff brace in bed, when ambulating to bathroom, and while showering. C-collar applied in sitting position   Required Braces or Orthoses: Cervical Brace Cervical Brace: Hard collar Restrictions Weight Bearing Restrictions: No Pain: Pain Assessment Pain Scale: 0-10 Pain Score: 2 Pain Type: Chronic pain Pain Location: Neck Pain Orientation: Posterior Pain Descriptors / Indicators: Aching Pain Frequency: Constant Pain Onset: On-going Pain Intervention(s): Reposition;distraction    Therapy/Group: Individual Therapy  Jamey Demchak L Alysia Scism PT, DPT  03/05/2019, 4:38 PM

## 2019-03-05 NOTE — Progress Notes (Signed)
Occupational Therapy Session Note  Patient Details  Name: Denise Espinoza MRN: [id number] Date of Birth: 26-Nov-1929  Today's Date: 03/05/2019 OT Individual Time: 1330-1415 OT Individual Time Calculation (min): 45 min    Short Term Goals: Week 3:  OT Short Term Goal 1 (Week 3): Pt will complete manage pants in prep for toileting task with steadying assist in order to reduce caregiver burden OT Short Term Goal 2 (Week 3): Pt will consistently transfer with mod A +1 in order to reduce caregiver burden OT Short Term Goal 3 (Week 3): Pt will self feed 25% of meal with min A using AE PRN OT Short Term Goal 4 (Week 3): Pt will stand with steadying assist to complete hand hygiene at sink in order to increase functional standing balance and endurance  Skilled Therapeutic Interventions/Progress Updates:    Pt seen for OT session focusing on functional mobility, education and pain management. PT sitting up in w/c upon arrival on telephone with friend, agreeable to tx session.  Pt reports haivng had difficulty self-feeding lunch of spaghetti, food all over cervical brace and clothes. Cervical brace pads changed and pt washed face. Enoucraged pt to ask for assist and importance of self-advocacy and directing care. During session, pt complaints of neck pain, 8/10. With encouragement, pt willing to ask for pain medicine. Education provided regarding pain management and encouraging pt to ask RN about different pain medicines available as pt does not like taking narcotics. RN made aware of pain and Tylenol administered during session.  Pt taken to therapy gym total A in w/c for time and energy conservation. Upon sit>stand at [facility], pt with reports of dizziness and requesting to sit down immediately. Seated rest break provided. Orthostatic vitals then taken, BP sitting in w/c 115/60. She stood with min A with RW and BP in standing 74/48. RN aware of pt's vitals.  Pt declining any functional mobility this session 2/2  dizziness.  She propelled w/c back towards room ~44ft using B LEs with min A. Min stand pivot transfer back to bed and pt positioned in supine with all needs in reach.   Therapy Documentation Precautions:  Precautions Precautions: Fall, Cervical Precaution Comments: Ok to doff brace in bed, when ambulating to bathroom, and while showering. C-collar applied in sitting position  Required Braces or Orthoses: Cervical Brace Cervical Brace: Hard collar Restrictions Weight Bearing Restrictions: (P) No   Therapy/Group: Individual Therapy  Christyan Reger L 03/05/2019, 7:02 AM

## 2019-03-05 NOTE — [place] FL2 (Signed)
[place] MEDICAID FL2 LEVEL OF CARE SCREENING TOOL     IDENTIFICATION  Patient Name: Denise Espinoza Birthdate: Jun 18, 1930 Sex: female Admission Date (Current Location): 02/15/2019  [facility] and [place] Number:  Producer, television/film/video and Address:  [facility] [place], [address] [place], [place] [address]      Provider Number: [id number]  Attending Physician Name and Address:  Ranelle Oyster, MD  Relative Name and Phone Number:       Current Level of Care: Other (Comment)(Acute Inpatient Rehab) Recommended Level of Care: Skilled Nursing Facility Prior Approval Number:    Date Approved/Denied:   PASRR Number:    Discharge Plan: SNF    Current Diagnoses: Patient Active Problem List   Diagnosis Date Noted  . Central cord syndrome (HCC) 02/15/2019  . 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    Orientation RESPIRATION BLADDER Height & Weight     Self, Time, Situation, Place  Normal Incontinent(but gaining some sensation and control) Weight: 135 lb 5.8 oz (61.4 kg) Height:  5' 2.99" (160 cm)  BEHAVIORAL SYMPTOMS/MOOD NEUROLOGICAL BOWEL NUTRITION STATUS      Incontinent Diet(regular)  AMBULATORY STATUS COMMUNICATION OF NEEDS Skin   Extensive Assist(moderate)   Normal                       Personal Care Assistance Level of Assistance  Bathing, Feeding, Dressing, Total care Bathing Assistance: Limited assistance(minimal  - moderate assistance overall) Feeding assistance: Limited assistance(mostly set up only) Dressing Assistance: Limited assistance Total Care Assistance: Limited assistance   Functional Limitations Info             SPECIAL CARE FACTORS FREQUENCY  PT (By licensed PT), OT (By licensed OT)     PT Frequency: 5x/wk OT Frequency: 5x/wk            Contractures Contractures Info: Not present    Additional Factors Info  Code Status, Allergies Code Status Info: Full Allergies Info: penicillins,  codeine           Current Medications (03/05/2019):  This is the current hospital active medication list Current Facility-Administered Medications  Medication Dose Route Frequency Provider Last Rate Last Dose  . acetaminophen (TYLENOL) tablet 325-650 mg  325-650 mg Oral Q4H PRN Jacquelynn Cree, PA-C   650 mg at 03/05/19 1356  . alum & mag hydroxide-simeth (MAALOX/MYLANTA) 200-200-20 MG/5ML suspension 30 mL  30 mL Oral Q4H PRN Love, Pamela S, PA-C      . bisacodyl (DULCOLAX) suppository 10 mg  10 mg Rectal Q0600 Jacquelynn Cree, PA-C   10 mg at 03/04/19 0556  . bisacodyl (DULCOLAX) suppository 10 mg  10 mg Rectal Daily PRN Love, Pamela S, PA-C      . calcium-vitamin D (OSCAL WITH D) 500-200 MG-UNIT per tablet 1 tablet  1 tablet Oral BID Jacquelynn Cree, PA-C   1 tablet at 03/05/19 0809  . diclofenac sodium (VOLTAREN) 1 % transdermal gel 2 g  2 g Topical QID Erick Colace, MD   2 g at 03/05/19 0816  . diphenhydrAMINE (BENADRYL) 12.5 MG/5ML elixir 12.5-25 mg  12.5-25 mg Oral Q6H PRN Love, Pamela S, PA-C      . gabapentin (NEURONTIN) capsule 300 mg  300 mg Oral TID Love, Pamela S, PA-C   300 mg at 03/05/19 1356  . guaiFENesin-dextromethorphan (ROBITUSSIN DM) 100-10 MG/5ML syrup 5-10 mL  5-10 mL Oral Q6H PRN Love, Evlyn Kanner, PA-C      .  HYDROcodone-acetaminophen (NORCO/VICODIN) 5-325 MG per tablet 1 tablet  1 tablet Oral Q4H PRN Jacquelynn Cree, PA-C   1 tablet at 02/26/19 1805  . metoprolol succinate (TOPROL-XL) 24 hr tablet 12.5 mg  12.5 mg Oral Daily Jacquelynn Cree, PA-C   12.5 mg at 03/05/19 0809  . oxyCODONE (Oxy IR/ROXICODONE) immediate release tablet 10 mg  10 mg Oral BID WC LoveEvlyn Kanner, PA-C   10 mg at 03/05/19 0254  . oxyCODONE (Oxy IR/ROXICODONE) immediate release tablet 5-10 mg  5-10 mg Oral Q3H PRN Jacquelynn Cree, PA-C   10 mg at 02/26/19 2201  . pantoprazole (PROTONIX) EC tablet 40 mg  40 mg Oral BID Jacquelynn Cree, PA-C   40 mg at 03/05/19 0809  . polyethylene glycol (MIRALAX /  GLYCOLAX) packet 17 g  17 g Oral Daily PRN Jacquelynn Cree, PA-C   17 g at 02/21/19 0843  . prochlorperazine (COMPAZINE) tablet 5-10 mg  5-10 mg Oral Q6H PRN Love, Pamela S, PA-C       Or  . prochlorperazine (COMPAZINE) injection 5-10 mg  5-10 mg Intramuscular Q6H PRN Love, Pamela S, PA-C       Or  . prochlorperazine (COMPAZINE) suppository 12.5 mg  12.5 mg Rectal Q6H PRN Love, Pamela S, PA-C      . rosuvastatin (CRESTOR) tablet 5 mg  5 mg Oral q1800 Love, Pamela S, PA-C   5 mg at 03/04/19 1712  . senna-docusate (Senokot-S) tablet 2 tablet  2 tablet Oral BID Erick Colace, MD   2 tablet at 03/05/19 0809  . sulfamethoxazole-trimethoprim (BACTRIM) 400-80 MG per tablet 1 tablet  1 tablet Oral Q12H Ranelle Oyster, MD   1 tablet at 03/05/19 0809  . traMADol (ULTRAM) tablet 50 mg  50 mg Oral Q6H PRN Jacquelynn Cree, PA-C   50 mg at 03/02/19 2139  . traZODone (DESYREL) tablet 25-50 mg  25-50 mg Oral QHS PRN Jacquelynn Cree, PA-C   50 mg at 03/04/19 2133     Discharge Medications: Please see discharge summary for a list of discharge medications.  Relevant Imaging Results:  Relevant Lab Results:   Additional Information Pt had NEGATIVE COVID test 03/04/2019;  SS# [id number]  Anum Palecek, LCSW

## 2019-03-05 NOTE — Progress Notes (Signed)
Occupational Therapy Session Note  Patient Details  Name: Denise Espinoza MRN: [id number] Date of Birth: [birth year]  Today's Date: 03/05/2019 OT Individual Time: 1700-1749 OT Individual Time Calculation (min): 72 min    Short Term Goals: Week 2:  OT Short Term Goal 1 (Week 2): Pt will consistently complete transfers with mod A +1 OT Short Term Goal 1 - Progress (Week 2): Not met OT Short Term Goal 2 (Week 2): Pt will thread B UEs into shirt with min A OT Short Term Goal 2 - Progress (Week 2): Met OT Short Term Goal 3 (Week 2): Pt will complete toileting task with total A +1 in order to decrease burden of care OT Short Term Goal 3 - Progress (Week 2): Not met OT Short Term Goal 4 (Week 2): Pt will tolerate 5 minutes of unsupported sitting with supervision in order to increase postural control/stability.  OT Short Term Goal 4 - Progress (Week 2): Met OT Short Term Goal 5 (Week 2): Pt will stand for 2 minutes with min A for balance in prep for ADL task. OT Short Term Goal 5 - Progress (Week 2): Met  Skilled Therapeutic Interventions/Progress Updates:    pt recievedd in bed with no c/o pain. Pt declines bathing this date but reports brief is soiled. Teds donned total A and non skid slipper socks donned for transfer to w/c. MIN A stand pivot with HHA to w/c with VC for hand palcement. Pt washes face and peri area with MIN A for balance in stadning. OT provides A for thoroughness on buttock cleaning. MOD A for UB dressing and VC for pulling sleeves past elbows. MAX A for LB dressing (pt able to thread RLE into pants). Pt combs hair for A for back of head and applies lip stick and blush with min A to open blush container. Exited session with tp setaed in bed, exit alram on and call lighitn reach  Therapy Documentation Precautions:  Precautions Precautions: Fall, Cervical Precaution Comments: Ok to doff brace in bed, when ambulating to bathroom, and while showering. C-collar applied in sitting  position  Required Braces or Orthoses: Cervical Brace Cervical Brace: Hard collar Restrictions Weight Bearing Restrictions: No General:   Vital Signs: Therapy Vitals Pulse Rate: 81 Resp: 14 BP: 118/68 Patient Position (if appropriate): Lying Oxygen Therapy SpO2: 96 % O2 Device: Room Air Pain: Pain Assessment Pain Scale: 0-10 Pain Score: 0-No pain ADL: ADL Eating: Not assessed Grooming: Dependent Where Assessed-Grooming: Bed level Upper Body Bathing: Dependent Where Assessed-Upper Body Bathing: Edge of bed Lower Body Bathing: Other (comment)(2 assist) Where Assessed-Lower Body Bathing: Bed level Upper Body Dressing: Dependent Where Assessed-Upper Body Dressing: Bed level Lower Body Dressing: Other (Comment)(2 assist) Where Assessed-Lower Body Dressing: Bed level Toileting: Other (Comment)(2 assist) Where Assessed-Toileting: Bed level Toilet Transfer: Not assessed Tub/Shower Transfer: Not assessed Vision   Perception    Praxis   Exercises:   Other Treatments:     Therapy/Group: Individual Therapy  Tonny Branch 03/05/2019, 10:00 AM

## 2019-03-05 NOTE — Progress Notes (Signed)
Recreational Therapy Session Note  Patient Details  Name: Denise Espinoza MRN: [id number] Date of Birth: 06/23/1930 Today's Date: 03/05/2019 Time:  1330-1415 Pain: c/o 8/10 neck pain, RN made aware and medicated with tylenol:  Pt does not want to take "strong pain medicines" Skilled Therapeutic Interventions/Progress Updates: Session focused on activity tolerance, w/c mobility during co-treat with OT.  Attempted stand pivot transfer with RW, but pt with c/o "whooziness", slow resolve.Orthostatic vitals then taken, BP sitting in w/c 115/60. She stood with min A with RW and BP in standing 74/48. RN aware of pt's vitals. Pt requesting to return to the room and get in bed.  Pt propelled w/c using BLEs with supervision ~75' and performed hand held assist/min assist transfer to bed.    Therapy/Group: Co-Treatment  Kj Imbert 03/05/2019, 2:22 PM

## 2019-03-05 NOTE — Progress Notes (Signed)
Physical Therapy Session Note  Patient Details  Name: Denise Espinoza MRN: [id number] Date of Birth: [birth year]  Today's Date: 03/05/2019 PT Individual Time: 1100-1145 PT Individual Time Calculation (min): 45 min   Short Term Goals: Week 3:  PT Short Term Goal 1 (Week 3): =LTG due to ELOS  Skilled Therapeutic Interventions/Progress Updates:    Pt received seated in bed, agreeable to PT session. Pt reports occasional sharp pain running down RUE with movement, does not report onset of this pain during session. Rolling L/R with CGA. Supine to sit with min A for trunk control. Stand pivot transfer bed to w/c with min A. Manual w/c propulsion x 150 ft with use of BLE and Supervision. Sit to stand with min A to RW. Ambulation x 45' with RW and mod A with close w/c follow. Pt continues to exhibit ataxic gait with B knees flexed and flexed trunk posture. Standing alt L/R forward steps with RUE HHA for balance, focus on extension in stance LE and maintaining standing balance. Pt exhibits poor awareness of LOB and requires multimodal cueing to correct balance. Standing mini-squats 2 x 7 reps with RUE HHA before onset of fatigue. Pt left seated in w/c in room with needs in reach at end of session.  Therapy Documentation Precautions:  Precautions Precautions: Fall, Cervical Precaution Comments: Ok to doff brace in bed, when ambulating to bathroom, and while showering. C-collar applied in sitting position  Required Braces or Orthoses: Cervical Brace Cervical Brace: Hard collar Restrictions Weight Bearing Restrictions: No    Therapy/Group: Individual Therapy   Peter Congo, PT, DPT  03/05/2019, 12:43 PM

## 2019-03-05 NOTE — Progress Notes (Signed)
[facility] PHYSICAL MEDICINE & REHABILITATION PROGRESS NOTE   Subjective/Complaints: Had a reasonable night. Pain under control.   ROS: Patient denies fever, rash, sore throat, blurred vision, nausea, vomiting, diarrhea, cough, shortness of breath or chest pain, joint or back pain, headache, or mood change.      Objective:   No results found. Recent Labs    03/03/19 0557  WBC 5.1  HGB 10.1*  HCT 30.9*  PLT 184   Recent Labs    03/03/19 0557  NA 137  K 3.7  CL 99  CO2 27  GLUCOSE 98  BUN 15  CREATININE 0.59  CALCIUM 9.1    Intake/Output Summary (Last 24 hours) at 03/05/2019 1243 Last data filed at 03/05/2019 0900 Gross per 24 hour  Intake 420 ml  Output 125 ml  Net 295 ml     Physical Exam: Vital Signs Blood pressure 118/68, pulse 81, temperature 98.2 F (36.8 C), resp. rate 14, height 5' 2.99" (1.6 m), weight 61.4 kg, SpO2 96 %. Constitutional: No distress . Vital signs reviewed. HEENT: EOMI, oral membranes moist Neck: supple Cardiovascular: RRR without murmur. No JVD    Respiratory: CTA Bilaterally without wheezes or rales. Normal effort    GI: BS +, non-tender, non-distended  Musculoskeletal:  General: C-collar Neurological:  Alert, normal insight and awareness. .    RUE  3 to 3+/5 deltoid,biceps  to 3+ distal. LUE  3+ to 4/5 prox to 4 distal- .  RLE 4-/5 prox to distal. LLE  4/5 prox to distal. Decreased LT and pain throughout although senses gross touch and pain below shoulders bilaterally.---UE>LE sensory deficits.   DTR's 2+ UE and 3+ LE's.  Skin: Skin iswarmand dry. Psych:pleasant   Assessment/Plan: 1. Functional deficits secondary to cervical myelopathy/central cord which require 3+ hours per day of interdisciplinary therapy in a comprehensive inpatient rehab setting.  Physiatrist is providing close team supervision and 24 hour management of active medical problems listed below.  Physiatrist and rehab team continue to assess barriers to  discharge/monitor patient progress toward functional and medical goals  Care Tool:  Bathing    Body parts bathed by patient: Right arm, Left arm, Chest, Abdomen, Front perineal area, Right upper leg, Left upper leg, Face   Body parts bathed by helper: Right lower leg, Left lower leg, Buttocks     Bathing assist Assist Level: Minimal Assistance - Patient > 75%     Upper Body Dressing/Undressing Upper body dressing   What is the patient wearing?: Orthosis, Button up shirt Orthosis activity level: Performed by helper  Upper body assist Assist Level: Moderate Assistance - Patient 50 - 74%    Lower Body Dressing/Undressing Lower body dressing      What is the patient wearing?: Pants, Incontinence brief     Lower body assist Assist for lower body dressing: Maximal Assistance - Patient 25 - 49%     Toileting Toileting    Toileting assist Assist for toileting: Maximal Assistance - Patient 25 - 49%     Transfers Chair/bed transfer  Transfers assist     Chair/bed transfer assist level: Minimal Assistance - Patient > 75%     Locomotion Ambulation   Ambulation assist   Ambulation activity did not occur: Safety/medical concerns  Assist level: Moderate Assistance - Patient 50 - 74% Assistive device: Walker-rolling(R hand orthosis) Max distance: 45'   Walk 10 feet activity   Assist  Walk 10 feet activity did not occur: Safety/medical concerns  Assist level: Moderate Assistance -  Patient - 50 - 74% Assistive device: Walker-rolling   Walk 50 feet activity   Assist Walk 50 feet with 2 turns activity did not occur: Safety/medical concerns  Assist level: Moderate Assistance - Patient - 50 - 74% Assistive device: Walker-rolling    Walk 150 feet activity   Assist Walk 150 feet activity did not occur: Safety/medical concerns         Walk 10 feet on uneven surface  activity   Assist Walk 10 feet on uneven surfaces activity did not occur: Safety/medical  concerns         Wheelchair     Assist Will patient use wheelchair at discharge?: Yes Type of Wheelchair: Manual    Wheelchair assist level: Supervision/Verbal cueing Max wheelchair distance: 150'    Wheelchair 50 feet with 2 turns activity    Assist        Assist Level: Supervision/Verbal cueing   Wheelchair 150 feet activity     Assist     Assist Level: Supervision/Verbal cueing    Medical Problem List and Plan: 1.Functional and mobility deficitssecondary to C2 fracture and 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 Pt is s/p C1 laminectomy, C2 ORIF and Occiput to C4 PLIF -Continue CIR therapies including PT, OT   -placement now pending  -bilateral WHO's     2. Antithrombotics: -DVT/anticoagulation:Mechanical:Sequential compression devices, below kneeBilateral lower extremities. Dopplers negative -antiplatelet therapy: N/A 3. Pain Management:Oxycodone prn--premedicate prior to therapy. Gabapentin tid for neuropathy 4. Mood:Team to provide ego support. LCSW to follow for evaluation and support. -antipsychotic agents: N/A 5. Neuropsych: This patientiscapable of making decisions on herown behalf. 6. Skin/Wound Care:Will order air matress overlay for pressure relief. 7. Fluids/Electrolytes/Nutrition:encourage PO 8. H/o FGH:[id number] metoprolol daily 9. Neurogenic bowel:   -improving continence  10. Neurogenic bladder:Has been incontinent for years.   -time toileting to help improve continence 11. Fever: afebrile   -  resumed bactrim for 5 day course (5/25) to cover proteus in urine LOS: 18 days A FACE TO FACE EVALUATION WAS PERFORMED  Ranelle Oyster 03/05/2019, 12:43 PM

## 2019-03-05 NOTE — Patient Care Conference (Signed)
Inpatient RehabilitationTeam Conference and Plan of Care Update Date: 03/04/2019   Time: 2:10 PM    Patient Name: Denise Espinoza      Medical Record Number: [id number]  Date of Birth: 1929/12/30 Sex: Female         Room/Bed: 4W04C/4W04C-01 Payor Info: Payor: MEDICARE / Plan: MEDICARE PART A AND B / Product Type: *No Product type* /    Admitting Diagnosis: SCI Team  Other TSC dysf, central cord syndrome ; 21-50days, 36 final rule  Admit Date/Time:  02/15/2019  4:54 PM Admission Comments: No comment available   Primary Diagnosis:  <principal problem not specified> Principal Problem: <principal problem not specified>  Patient Active Problem List   Diagnosis Date Noted  . Central cord syndrome (HCC) 02/15/2019  . 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    Expected Discharge Date: Expected Discharge Date: (SNF)  Team Members Present: Physician leading conference: Dr. Faith Rogue Social Worker Present: Amada Jupiter, LCSW Nurse Present: Ronny Bacon, RN PT Present: Other (comment)(Taylor Serita Grit, PT) OT Present: Amy Rounds, OT PPS Coordinator present : Edson Snowball, PT     Current Status/Progress Goal Weekly Team Focus  Medical   cervical myelopathy, improved UE motor, impaired sensation inhibits fine motor coordination  decrease pain, improve functional mobility  bowel bladder mgt, pain mgt   Bowel/Bladder   B&B incont., suppository every morning  day time toileting, frequent checks, prevent breakdown  assess toileting needs   Swallow/Nutrition/ Hydration             ADL's   Min-mod A stand pivot transfers; mod A UB dressing; min A LB dressing; Set-up self-feeding; max A toileting  Upgraded to min-mod A overall  ADL re-training, neuro re-ed, functional transfers, functional activity tolerance   Mobility   min to mod A bed mobility, mod A stand pivot transfer, min to mod A x 2 gait up to 54' with PFRW, S w/c mobility with BLE  upgraded to min A overall   standing balance, NMR, gait training   Communication             Safety/Cognition/ Behavioral Observations            Pain   PRNs, complaint of hand & back pain  pain less than 3  assess pain level and control it   Skin   none at the moment  maintain skin intgrity  assess skin and prevent issues    Rehab Goals Patient on target to meet rehab goals: Yes *See Care Plan and progress notes for long and short-term goals.     Barriers to Discharge  Current Status/Progress Possible Resolutions Date Resolved   Physician    Medical stability               Nursing                  PT  Decreased caregiver support;Home environment access/layout  pt cannot provide 24/7 assist upon d/c              OT                  SLP                SW                Discharge Planning/Teaching Needs:  D/C plan at time of CIR admission was SNF - team aware.  NA   Team Discussion:  Continue make progress;  Improved b/b contol.  Minor UTI - treating.  Pain in hands still.  Min-mod for stand-pivot tfs.  Feeding self with set up.  Working on Investment banker, operational and can amb ~ 50 with 1-2 people and min/ mod assist.  Several goals being upgraded.  Team feels pt at level that SNF bed search can begin - SW aware.  Revisions to Treatment Plan:  NA    Continued Need for Acute Rehabilitation Level of Care: The patient requires daily medical management by a physician with specialized training in physical medicine and rehabilitation for the following conditions: Daily direction of a multidisciplinary physical rehabilitation program to ensure safe treatment while eliciting the highest outcome that is of practical value to the patient.: Yes Daily medical management of patient stability for increased activity during participation in an intensive rehabilitation regime.: Yes Daily analysis of laboratory values and/or radiology reports with any subsequent need for medication adjustment of medical intervention for : Post  surgical problems;Neurological problems   I attest that I was present, lead the team conference, and concur with the assessment and plan of the team.   Miki Blank 03/05/2019, 11:01 AM   Team conference was held via web/ teleconference due to COVID - 19

## 2019-03-05 NOTE — Progress Notes (Signed)
Therapy notified RN of patients orthostatic BP reading when standing. Patients became light headed and BP was 80/50. Patient sat for a few minutes and BP was 104/54. A second transfer was done in the same session and BP dropped 54/34. After sitting a few minutes it was 118/53. Nurse notified Pam, PA and orders were received to change Bactrim to 800/160mg  every 12 hours and a BMP.  Shelda Jakes, RN

## 2019-03-06 ENCOUNTER — Inpatient Hospital Stay (HOSPITAL_COMMUNITY): Payer: PRIVATE HEALTH INSURANCE | Admitting: Physical Therapy

## 2019-03-06 ENCOUNTER — Inpatient Hospital Stay (HOSPITAL_COMMUNITY): Payer: PRIVATE HEALTH INSURANCE | Admitting: Occupational Therapy

## 2019-03-06 NOTE — Progress Notes (Signed)
Occupational Therapy Session Note  Patient Details  Name: Denise Espinoza MRN: [id number] Date of Birth: 07/13/1930  Today's Date: 03/06/2019 OT Individual Time: 1030-1135 OT Individual Time Calculation (min): 65 min    Short Term Goals: Week 3:  OT Short Term Goal 1 (Week 3): Pt will complete manage pants in prep for toileting task with steadying assist in order to reduce caregiver burden OT Short Term Goal 2 (Week 3): Pt will consistently transfer with mod A +1 in order to reduce caregiver burden OT Short Term Goal 3 (Week 3): Pt will self feed 25% of meal with min A using AE PRN OT Short Term Goal 4 (Week 3): Pt will stand with steadying assist to complete hand hygiene at sink in order to increase functional standing balance and endurance  Skilled Therapeutic Interventions/Progress Updates:    Pt seen for OT session focusing on ADL re-training and fine motor control during self-care tasks. Pt sitting up in w/c upon arrival, denied pain and agreeable to tx session. Discussion with pt regarding orthostatic events during therapy yesterday afternoon and today. Pt wishing to remain at w/c level for this session and desiring to work on putting on make-up. Seated in w/c at sink, pt able to reach forward to manage sink controls and manipulate bottle to get soap. She washed face and hands. Therapist assisted with using toothbrush to wash under fingernails.  Donned make-up from w/c level with pull over table in front of her and table top mirror placed in front. She required assist to open small make-up containers, however, was able to apply  Blush with brush and eye make-up, using self hand over hand to assist with stabilization.  Obtained orthostatic vitals as follows. Taken with thigh high TED hose and abdominal binder donned:  Sitting in w/c: 115/63  Standing: 68/44. Pt symptomatic with standing. Pt agreeable to transition to recliner at end of session. Min HHA for stand pivot to recliner. Feet  elevated and pt left with all needs in reach. Education provided throughout session regarding POC, continuum of care, therapy goals, decreased BP including functional implications and importance of maintaining hydration.   Therapy Documentation Precautions:  Precautions Precautions: Fall, Cervical Precaution Comments: Ok to doff brace in bed, when ambulating to bathroom, and while showering. C-collar applied in sitting position  Required Braces or Orthoses: Cervical Brace Cervical Brace: Hard collar Restrictions Weight Bearing Restrictions: No   Therapy/Group: Individual Therapy  Mackenzy Grumbine L 03/06/2019, 7:01 AM

## 2019-03-06 NOTE — Progress Notes (Signed)
[place] PHYSICAL MEDICINE & REHABILITATION PROGRESS NOTE   Subjective/Complaints: orthostasis this morning when standing. Had BM earlier  ROS: Patient denies fever, rash, sore throat, blurred vision, nausea, vomiting, diarrhea, cough, shortness of breath or chest pain, joint or back pain, headache, or mood change.   Objective:   No results found. No results for input(s): WBC, HGB, HCT, PLT in the last 72 hours. Recent Labs    03/05/19 1815  NA 137  K 4.7  CL 101  CO2 26  GLUCOSE 108*  BUN 19  CREATININE 0.79  CALCIUM 8.9    Intake/Output Summary (Last 24 hours) at 03/06/2019 0903 Last data filed at 03/06/2019 0750 Gross per 24 hour  Intake 340 ml  Output -  Net 340 ml     Physical Exam: Vital Signs Blood pressure (!) 91/56, pulse 79, temperature 98.7 F (37.1 C), temperature source Oral, resp. rate 16, height 5' 2.99" (1.6 m), weight 61.4 kg, SpO2 96 %. Constitutional: No distress . Vital signs reviewed. HEENT: EOMI, oral membranes moist Neck: supple Cardiovascular: RRR without murmur. No JVD    Respiratory: CTA Bilaterally without wheezes or rales. Normal effort    GI: BS +, non-tender, non-distended  Musculoskeletal:  General: C-collar Neurological:  Alert, normal insight and awareness. .    RUE  3 to 3+/5 deltoid,biceps  to 3+ distal. LUE  3+ to 4/5 prox to 4 distal- .  RLE 4-/5 prox to distal. LLE  4/5 prox to distal.---motor exam stable. Decreased LT and pain throughout although senses gross touch and pain below shoulders bilaterally.---UE>LE sensory deficits.   DTR's 2+ UE and 3+ LE's.  Skin: Skin iswarmand dry. Psych:pleasant   Assessment/Plan: 1. Functional deficits secondary to cervical myelopathy/central cord which require 3+ hours per day of interdisciplinary therapy in a comprehensive inpatient rehab setting.  Physiatrist is providing close team supervision and 24 hour management of active medical problems listed below.  Physiatrist and  rehab team continue to assess barriers to discharge/monitor patient progress toward functional and medical goals  Care Tool:  Bathing    Body parts bathed by patient: Right arm, Left arm, Chest, Abdomen, Front perineal area, Right upper leg, Left upper leg, Face   Body parts bathed by helper: Right lower leg, Left lower leg, Buttocks     Bathing assist Assist Level: Minimal Assistance - Patient > 75%     Upper Body Dressing/Undressing Upper body dressing   What is the patient wearing?: Orthosis, Button up shirt Orthosis activity level: Performed by helper  Upper body assist Assist Level: Moderate Assistance - Patient 50 - 74%    Lower Body Dressing/Undressing Lower body dressing      What is the patient wearing?: Pants, Incontinence brief     Lower body assist Assist for lower body dressing: Maximal Assistance - Patient 25 - 49%     Toileting Toileting    Toileting assist Assist for toileting: Maximal Assistance - Patient 25 - 49%     Transfers Chair/bed transfer  Transfers assist     Chair/bed transfer assist level: Moderate Assistance - Patient 50 - 74%(due to low BP/light headedness)     Locomotion Ambulation   Ambulation assist   Ambulation activity did not occur: Safety/medical concerns  Assist level: Moderate Assistance - Patient 50 - 74% Assistive device: Walker-rolling(R hand orthosis) Max distance: 45'   Walk 10 feet activity   Assist  Walk 10 feet activity did not occur: Safety/medical concerns  Assist level: Moderate Assistance - Patient -  50 - 74% Assistive device: Walker-rolling   Walk 50 feet activity   Assist Walk 50 feet with 2 turns activity did not occur: Safety/medical concerns  Assist level: Moderate Assistance - Patient - 50 - 74% Assistive device: Walker-rolling    Walk 150 feet activity   Assist Walk 150 feet activity did not occur: Safety/medical concerns         Walk 10 feet on uneven surface   activity   Assist Walk 10 feet on uneven surfaces activity did not occur: Safety/medical concerns         Wheelchair     Assist Will patient use wheelchair at discharge?: Yes Type of Wheelchair: Manual    Wheelchair assist level: Supervision/Verbal cueing Max wheelchair distance: 150'    Wheelchair 50 feet with 2 turns activity    Assist        Assist Level: Supervision/Verbal cueing   Wheelchair 150 feet activity     Assist     Assist Level: Supervision/Verbal cueing    Medical Problem List and Plan: 1.Functional and mobility deficitssecondary to C2 fracture and 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 Pt is s/p C1 laminectomy, C2 ORIF and Occiput to C4 PLIF -Continue CIR therapies including PT, OT   -placement now pending  -bilateral WHO's     2. Antithrombotics: -DVT/anticoagulation:Mechanical:Sequential compression devices, below kneeBilateral lower extremities. Dopplers negative -antiplatelet therapy: N/A 3. Pain Management:Oxycodone prn--premedicate prior to therapy. Gabapentin tid for neuropathy 4. Mood:Team to provide ego support. LCSW to follow for evaluation and support. -antipsychotic agents: N/A 5. Neuropsych: This patientiscapable of making decisions on herown behalf. 6. Skin/Wound Care:Will order air matress overlay for pressure relief. 7. Fluids/Electrolytes/Nutrition:encourage PO 8. H/o OFB:PZWCHENI metoprolol daily 9. Neurogenic bowel:   -improving continence  10. Neurogenic bladder:Has been incontinent for years.   -time toileting to help improve continence 11. Fever: afebrile   -  resumed bactrim for 5 day course (5/25) to cover proteus in urine 12. Orthostasis:  -likely vasovagal related to BM this morning  -has been eating and drinking well  -TEDS/Abd binder   LOS: 19 days A FACE TO FACE EVALUATION WAS PERFORMED  Ranelle Oyster 03/06/2019, 9:03 AM

## 2019-03-06 NOTE — Plan of Care (Signed)
Problem: Education: Goal: Ability to verbalize activity precautions or restrictions will improve Description Patient will be able to verbalize safety precautions with minimal precautions.   Outcome: Progressing Goal: Knowledge of the prescribed therapeutic regimen will improve Outcome: Progressing Goal: Understanding of discharge needs will improve Outcome: Progressing   Problem: Activity: Goal: Ability to avoid complications of mobility impairment will improve Description Patient will remain free from falls.   Outcome: Progressing Goal: Ability to tolerate increased activity will improve Description Will improve tolerance to increased activity with min. assist  Outcome: Progressing Goal: Will remain free from falls Outcome: Progressing   Problem: Bowel/Gastric: Goal: Gastrointestinal status for postoperative course will improve Description Patient will have BM at minimum every other day.   Outcome: Progressing   Problem: Clinical Measurements: Goal: Ability to maintain clinical measurements within normal limits will improve Description Patient VS will remain within defined limits.   Outcome: Progressing Goal: Postoperative complications will be avoided or minimized Outcome: Progressing Goal: Diagnostic test results will improve Outcome: Progressing   Problem: Pain Management: Goal: Pain level will decrease Description Patient will maintain pain level of <3/10 with medications.   Outcome: Progressing   Problem: Skin Integrity: Goal: Will show signs of wound healing Description Patient will remain free from PI with moderate assist to turning  Outcome: Progressing   Problem: Health Behavior/Discharge Planning: Goal: Identification of resources available to assist in meeting health care needs will improve Description Patient will notify staff of need for assist with minimal assist.  Outcome: Progressing   Problem: Bladder/Genitourinary: Goal: Urinary functional  status for postoperative course will improve Description Patient will notify staff in time to utilize bedpan or BSC with minimal assist.   Outcome: Progressing   Problem: Consults Goal: RH SPINAL CORD INJURY PATIENT EDUCATION Description  See Patient Education module for education specifics.  Outcome: Progressing   Problem: SCI BOWEL ELIMINATION Goal: RH STG MANAGE BOWEL WITH ASSISTANCE Description STG Manage Bowel with max Assistance.  Outcome: Progressing Goal: RH STG SCI MANAGE BOWEL WITH MEDICATION WITH ASSISTANCE Description STG SCI Manage bowel with medication with mod assistance.  Outcome: Progressing Goal: RH STG SCI MANAGE BOWEL PROGRAM W/ASSIST OR AS APPROPRIATE Description STG SCI Manage bowel program w/total assist or as appropriate.  Outcome: Progressing   Problem: SCI BLADDER ELIMINATION Goal: RH STG MANAGE BLADDER WITH ASSISTANCE Description STG Manage Bladder With max Assistance  Outcome: Progressing Goal: RH STG SCI MANAGE BLADDER PROGRAM W/ASSISTANCE Description With max assistance  Outcome: Progressing   Problem: RH SKIN INTEGRITY Goal: RH STG SKIN FREE OF INFECTION/BREAKDOWN Outcome: Progressing Goal: RH STG MAINTAIN SKIN INTEGRITY WITH ASSISTANCE Description STG Maintain Skin Integrity With max Assistance.  Outcome: Progressing Goal: RH STG ABLE TO PERFORM INCISION/WOUND CARE W/ASSISTANCE Description STG Able To Perform Incision/Wound Care With mod Assistance.  Outcome: Progressing   Problem: RH SAFETY Goal: RH STG ADHERE TO SAFETY PRECAUTIONS W/ASSISTANCE/DEVICE Description STG Adhere to Safety Precautions With min  Assistance/Device.  Outcome: Progressing   Problem: RH PAIN MANAGEMENT Goal: RH STG PAIN MANAGED AT OR BELOW PT'S PAIN GOAL Description Rate pain < 3 on pain scale  Outcome: Progressing   Problem: RH KNOWLEDGE DEFICIT SCI Goal: RH STG INCREASE KNOWLEDGE OF SELF CARE AFTER SCI Description Patient will verbalize self-care  needs with minimal assist.   Outcome: Progressing   Problem: RH Pre-functional/Other (Specify) Goal: RH LTG Interdisciplinary (Specify) 1 Description Pt will direct her care (needs, preferences, set up assistance, instructing caregivers) 50% of the time.  Outcome: Progressing

## 2019-03-06 NOTE — Progress Notes (Signed)
Orthopedic Tech Progress Note Patient Details:  Denise Espinoza September 03, 1930 [id number] RN or therapy called requesting abdominal binder Ortho Devices Type of Ortho Device: Abdominal binder Ortho Device/Splint Interventions: Adjustment, Ordered, Application   Post Interventions Patient Tolerated: Well Instructions Provided: Care of device, Adjustment of device   [name] 03/06/2019, 9:21 AM

## 2019-03-06 NOTE — Progress Notes (Signed)
Physical Therapy Session Note  Patient Details  Name: Denise Espinoza MRN: [id number] Date of Birth: June 17, 1930  Today's Date: 03/06/2019 PT Individual Time: [phone] and [id number] PT Individual Time Calculation (min): 31 min and 42 min  Short Term Goals: Week 3:  PT Short Term Goal 1 (Week 3): =LTG due to ELOS  Skilled Therapeutic Interventions/Progress Updates:   Session 1: Pt received supine in bed on bed pan and agreeable to therapy session. Rolling R/L using bedrails with supervision for removal of bed pan and total assist peri-care - pt continent of bowels. Therapist donned pt's cervical collar total assist. Performed supine>sit with min/mod assist for B LE management and trunk upright - pt reporting onset of lightheadedness. Provided sitting rest break for vital sign improvement with pt education on importance of increasing fluid consumption. Pt requesting to put on new shirt. Performed UB clothing management with mod/max assist. Sit>supine with min/mod assist for B LE management. Pt left supine in bed with needs in reach, c-collar still on, and bed alarm on.   Vitals assessed throughout session:  Supine: BP 101/57 (MAP 68) HR 79bpm Sitting: BP 87/57 (MAP 67) HR 84bpm with pt reporting lightheadedness Sitting: BP 109/61 (MAP 77) HR 88bpm with pt reporting no lightheadedness Supine: BP 123/59 (MAP 75) HR 81 with pt reporting onset of lightheadedness when returning to supine  Session 2: Pt received sitting in recliner with B LEs elevated and agreeable to therapy session. Pt wearing c-collar, B LE thigh high TED hose and abdominal binder throughout session. Assessed vitals as detailed below. Sit>stand recliner>RW with mod assist for lifting into standing. Returned to sitting with B LEs elevated due to low BP. Squat pivot transfer recliner>w/c with max assist for lifting/pivoting hips. Transported to/from gym in w/c. Squat pivot w/c>Nusted with max assist for lifting/pivoting hips. Pt performed  B UE and B LE Nustep on level 3 resistance for global strengthening and increased activity tolerance for 6 minutes for a total of 264 steps. Vitals in sitting after Nustep: BP 125/71 (MAP 86), HR 84bpm. Stand pivot Nustep>w/c with mod assist for balance and pivoting. Transported back to room in w/c. Stand pivot w/c>EOB with min/mod assist for balance. Sit>supine with CGA and cuing for logroll technique. Pt left supine in bed with needs in reach, c-collar still on, and bed alarm on.  Sitting in recliner: BP 110/67 (MAP 78) HR 87bpm Standing: BP 71/46 (MAP 56) HR 98bpm with pt reporting some lightheadedness and immediately returned to sitting with B LEs elevated Sitting: 104/67 (MAP 78) HR 77bpm   Therapy Documentation Precautions:  Precautions Precautions: Fall, Cervical Precaution Comments: Ok to doff brace in bed, when ambulating to bathroom, and while showering. C-collar applied in sitting position  Required Braces or Orthoses: Cervical Brace Cervical Brace: Hard collar Restrictions Weight Bearing Restrictions: No  Pain:  Session 1: Reports when having a BM it feels that the "nerve endings" in her legs cause pain.   Session 2: Denies pain but reports numbness throughout B LEs and B UE fingers.   Therapy/Group: Individual Therapy  Ginny Forth, PT, DPT 03/06/2019, 7:49 AM

## 2019-03-06 NOTE — Progress Notes (Signed)
Physical Therapy Session Note  Patient Details  Name: Denise Espinoza MRN: [id number] Date of Birth: Nov 09, 1929  Today's Date: 03/06/2019 PT Individual Time: 0900-1000 PT Individual Time Calculation (min): 60 min   Short Term Goals: Week 3:  PT Short Term Goal 1 (Week 3): =LTG due to ELOS  Skilled Therapeutic Interventions/Progress Updates:    Pt received semi-reclined in bed, agreeable to PT session. Pt reports some soreness in R shoulder, not rated. Dependent to don knee-high TED hose and pants. Semi-reclined BP 102/49. Semi-reclined to sitting EOB with min A. Seated BP 111/58. Sit to stand with min A, standing BP 71/55. Pt returned to supine with min A. Dependent to don thigh-high ted hose and tighten abdominal binder. Seated BP with thigh-high TED hose 101/62. Standing BP 75/48. Deferred standing activity this session 2/2 orthostatic BP and dizziness in standing with drop in BP. Stand pivot transfer to Nustep with min A. Nustep level 3 x 10 min with use of B UE/LE for global strengthening, use of RUE grip assist. Seated BP following Nustep: 124/68. Stand pivot transfer back to w/c with min A. Manual w/c propulsion x 150 ft with use of BUE and Supervision. Pt left seated in w/c in room with needs in reach at end of session, encouraged PO fluid intake. Updated RN on pt's orthostatic BP.  Therapy Documentation Precautions:  Precautions Precautions: Fall, Cervical Precaution Comments: Ok to doff brace in bed, when ambulating to bathroom, and while showering. C-collar applied in sitting position  Required Braces or Orthoses: Cervical Brace Cervical Brace: Hard collar Restrictions Weight Bearing Restrictions: No    Therapy/Group: Individual Therapy   Peter Congo, PT, DPT  03/06/2019, 10:22 AM

## 2019-03-07 ENCOUNTER — Inpatient Hospital Stay (HOSPITAL_COMMUNITY): Payer: PRIVATE HEALTH INSURANCE | Admitting: Occupational Therapy

## 2019-03-07 ENCOUNTER — Inpatient Hospital Stay (HOSPITAL_COMMUNITY): Payer: PRIVATE HEALTH INSURANCE | Admitting: Physical Therapy

## 2019-03-07 NOTE — Progress Notes (Signed)
Social Work Patient ID: Denise Espinoza, female   DOB: 05-15-1930, 85 y.o.   MRN: [id number]   Have reviewed conference and d/c plans with pt's daughter, Elita Quick and pt and both aware that team feels pt is ready to make SNF transition.  Beginning bed search.  Akiel Fennell, LCSW

## 2019-03-07 NOTE — Progress Notes (Signed)
[place] PHYSICAL MEDICINE & REHABILITATION PROGRESS NOTE   Subjective/Complaints: Wondering why her bp is low. Otherwise feeling well. Drinking out of cup on her own  ROS: Patient denies fever, rash, sore throat, blurred vision, nausea, vomiting, diarrhea, cough, shortness of breath or chest pain,   headache, or mood change.   Objective:   No results found. No results for input(s): WBC, HGB, HCT, PLT in the last 72 hours. Recent Labs    03/05/19 1815  NA 137  K 4.7  CL 101  CO2 26  GLUCOSE 108*  BUN 19  CREATININE 0.79  CALCIUM 8.9    Intake/Output Summary (Last 24 hours) at 03/07/2019 0857 Last data filed at 03/07/2019 0801 Gross per 24 hour  Intake 360 ml  Output -  Net 360 ml     Physical Exam: Vital Signs Blood pressure (!) 106/52, pulse 65, temperature 98 F (36.7 C), temperature source Oral, resp. rate 18, height 5' 2.99" (1.6 m), weight 61.4 kg, SpO2 96 %. Constitutional: No distress . Vital signs reviewed. HEENT: EOMI, oral membranes moist Neck: supple Cardiovascular: RRR without murmur. No JVD    Respiratory: CTA Bilaterally without wheezes or rales. Normal effort    GI: BS +, non-tender, non-distended  Musculoskeletal:  General: C-collar Neurological:  Alert, normal insight and awareness. .    RUE  3 to 3+/5 deltoid,biceps  to 3+ distal. LUE  3+ to 4/5 prox to 4 distal- .  RLE 4-/5 prox to distal. LLE  4/5 prox to distal.---motor exam stable. Decreased LT and pain throughout although senses gross touch and pain below shoulders bilaterally.---UE>LE sensory deficits.   DTR's 2+ UE and 3+ LE's.  Skin: Skin iswarmand dry. Psych:pleasant   Assessment/Plan: 1. Functional deficits secondary to cervical myelopathy/central cord which require 3+ hours per day of interdisciplinary therapy in a comprehensive inpatient rehab setting.  Physiatrist is providing close team supervision and 24 hour management of active medical problems listed  below.  Physiatrist and rehab team continue to assess barriers to discharge/monitor patient progress toward functional and medical goals  Care Tool:  Bathing    Body parts bathed by patient: Right arm, Left arm, Chest, Abdomen, Front perineal area, Right upper leg, Left upper leg, Face   Body parts bathed by helper: Right lower leg, Left lower leg, Buttocks     Bathing assist Assist Level: Minimal Assistance - Patient > 75%     Upper Body Dressing/Undressing Upper body dressing   What is the patient wearing?: Orthosis, Button up shirt Orthosis activity level: Performed by helper  Upper body assist Assist Level: Moderate Assistance - Patient 50 - 74%    Lower Body Dressing/Undressing Lower body dressing      What is the patient wearing?: Pants, Incontinence brief     Lower body assist Assist for lower body dressing: Maximal Assistance - Patient 25 - 49%     Toileting Toileting    Toileting assist Assist for toileting: Maximal Assistance - Patient 25 - 49%     Transfers Chair/bed transfer  Transfers assist     Chair/bed transfer assist level: Moderate Assistance - Patient 50 - 74%     Locomotion Ambulation   Ambulation assist   Ambulation activity did not occur: Safety/medical concerns  Assist level: Moderate Assistance - Patient 50 - 74% Assistive device: Walker-rolling(R hand orthosis) Max distance: 45'   Walk 10 feet activity   Assist  Walk 10 feet activity did not occur: Safety/medical concerns  Assist level: Moderate Assistance -  Patient - 50 - 74% Assistive device: Walker-rolling   Walk 50 feet activity   Assist Walk 50 feet with 2 turns activity did not occur: Safety/medical concerns  Assist level: Moderate Assistance - Patient - 50 - 74% Assistive device: Walker-rolling    Walk 150 feet activity   Assist Walk 150 feet activity did not occur: Safety/medical concerns         Walk 10 feet on uneven surface  activity   Assist  Walk 10 feet on uneven surfaces activity did not occur: Safety/medical concerns         Wheelchair     Assist Will patient use wheelchair at discharge?: Yes Type of Wheelchair: Manual    Wheelchair assist level: Supervision/Verbal cueing Max wheelchair distance: 150'    Wheelchair 50 feet with 2 turns activity    Assist        Assist Level: Supervision/Verbal cueing   Wheelchair 150 feet activity     Assist     Assist Level: Supervision/Verbal cueing    Medical Problem List and Plan: 1.Functional and mobility deficitssecondary to C2 fracture and 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 Pt is s/p C1 laminectomy, C2 ORIF and Occiput to C4 PLIF -Continue CIR therapies including PT, OT   -placement now pending  -bilateral WHO's   -pt making ongoing gains 2. Antithrombotics: -DVT/anticoagulation:Mechanical:Sequential compression devices, below kneeBilateral lower extremities. Dopplers negative -antiplatelet therapy: N/A 3. Pain Management:Oxycodone prn--premedicate prior to therapy. Gabapentin tid for neuropathy 4. Mood:Team to provide ego support. LCSW to follow for evaluation and support. -antipsychotic agents: N/A 5. Neuropsych: This patientiscapable of making decisions on herown behalf. 6. Skin/Wound Care:Will order air matress overlay for pressure relief. 7. Fluids/Electrolytes/Nutrition:encourage PO 8. H/o OZY:YQMGNOIB metoprolol daily 9. Neurogenic bowel:   -generally continent now 10. Neurogenic bladder:Has been incontinent for years.   -time toileting to help improve continence 11. Fever: afebrile   -  resumed bactrim for 5 day course (from 5/25) to cover proteus in urine 12. Orthostasis/hypotension  -suspect driven by loss of sympathetic tone  -has been eating and drinking well  -TEDS/Abd binder  -need to continue metoprolol for SVT  -consider florinef trial   LOS: 20 days A  FACE TO FACE EVALUATION WAS PERFORMED  Ranelle Oyster 03/07/2019, 8:57 AM

## 2019-03-07 NOTE — Progress Notes (Signed)
Physical Therapy Session Note  Patient Details  Name: Denise Espinoza MRN: [id number] Date of Birth: [birth year]  Today's Date: 03/07/2019 PT Individual Time: 1115-1230 PT Individual Time Calculation (min): 75 min   Short Term Goals: Week 3:  PT Short Term Goal 1 (Week 3): =LTG due to ELOS  Skilled Therapeutic Interventions/Progress Updates:    Pt received seated in w/c in room, agreeable to PT session. No complaints of pain but does reports N/T in RUE. Seated BP with TED hose only, 120/75. Sit to stand with min A. Standing BP 77/50. Assisted pt with donning abdominal binder. Standing BP with abdominal binder 77/68. Assisted pt with donning BLE ACE wraps, standing BP 87/60. Deferred standing activity this session due to ongoing orthostatic BP and dizziness with position changes. Sit to supine min A. Supine BLE therex x 10-15 reps: heel slides, SAQ, bridges, SKFO, SLR, hip abd. Supine to sit with min A. Stand pivot transfer mat table to w/c with min A. Pt left seated in w/c in room with needs in reach at end of session.  Therapy Documentation Precautions:  Precautions Precautions: Fall, Cervical Precaution Comments: Ok to doff brace in bed, when ambulating to bathroom, and while showering. C-collar applied in sitting position  Required Braces or Orthoses: Cervical Brace Cervical Brace: Hard collar Restrictions Weight Bearing Restrictions: No    Therapy/Group: Individual Therapy   Peter Congo, PT, DPT  03/07/2019, 4:03 PM

## 2019-03-07 NOTE — Progress Notes (Signed)
Occupational Therapy Weekly Progress Note  Patient Details  Name: Denise Espinoza MRN: [id number] Date of Birth: 07/14/1930  Beginning of progress report period: Feb 28, 2019 End of progress report period: Mar 07, 2019  Today's Date: 03/07/2019 OT Individual Time: 6659-9357 and 1415-1500 OT Individual Time Calculation (min): 76 min and 45 min   Patient has met 4 of 4 short term goals.  Pt cont to make excellent progress towards OT goals. She is completing stand pivot transfers with min A overall via HHA or RW. Min-mod A to power into standing. She requires steadying assist for standing balance and VCs for upright posture. Improving fine motor and UE control/ use for functional tasks, able to use at stabilizer level with R UE, non-dominant level L UE.  Limited in the past 2 days due to orthostatic hypotension despite thigh high TED hose and abdominal binder.   Patient continues to demonstrate the following deficits: acute pain, cognitive deficits, muscle weakness (generalized) and quadriparesis and therefore will continue to benefit from skilled OT intervention to enhance overall performance with BADL and Reduce care partner burden.  Patient progressing toward long term goals..  Plan of care revisions: Toileting and grooming goal upgraded. Added bathing goal of min A. See POC for goal details.  OT Short Term Goals Week 3:  OT Short Term Goal 1 (Week 3): Pt will complete manage pants in prep for toileting task with steadying assist in order to reduce caregiver burden OT Short Term Goal 1 - Progress (Week 3): Met OT Short Term Goal 2 (Week 3): Pt will consistently transfer with mod A +1 in order to reduce caregiver burden OT Short Term Goal 2 - Progress (Week 3): Met OT Short Term Goal 3 (Week 3): Pt will self feed 25% of meal with min A using AE PRN OT Short Term Goal 3 - Progress (Week 3): Met OT Short Term Goal 4 (Week 3): Pt will stand with steadying assist to complete hand hygiene at sink in  order to increase functional standing balance and endurance OT Short Term Goal 4 - Progress (Week 3): Met Week 4:  OT Short Term Goal 1 (Week 4): STG=LTG due to LOS  Skilled Therapeutic Interventions/Progress Updates:    Session One: Pt seen for OT ADL bathing/dressing session. Pt awake in supine upon arrival, agreeable to tx session and denying pain. She transferred to sitting EOB with min A and multi modal cuing for technique.  Throughout session, completed stand pivot transfers with HHA or use of grab bars with min-mod A with strong posterior bias noted.  She bathed seated on tub transfer bench. Used bath mit on L hand to bathe body, assist required for buttock and min A overall for bathing. She returned to w/c and then requested toileting task. Min A stand pivot with use of grab bar. Pt able to complete pericare seated on toilet.  She completed grooming tasks from w/c level at sink with assist for blow drying hair. She donned pull over shirt with min A to pull over head, able to thread B UEs into shirt. With increased time and min A for clothing management pt able to thread LEs into pants. She stood at sink with min A, brief donned. Pt with orthostatic symptoms in standing and therefore therapist pulled pants up total A. Thigh high TED hose donned. Pt left sitting up in w/c at end of session, all needs in reach. RN made aware of pt's position and orthostatic symptoms.  BP in sitting  104/61.   Session Two: Pt seen for OT session focusing on fine motor coordination during self-care tasks. Pt sitting up in w/c upon arrival, denying pain and agreeable to tx session. Pt desiring to put on make-up in prep for Zoom meeting with family later in the evening.  Completed face and eye make-up from w/c level at sink, assist for opening small containers and managing small eye make-up applicator. Pt able to brush hair with assist for back only. Per pt request, taken to family room to use vending machine. Pt  requires assist to place money in slot 2/2 UE ROM limitations and although able to reach buttons to make selection was unable to provide enough pressure to activate. She self propelled w/c using B LEs with min A, to get started. Completed fine motor table top activity. Able to pick up and flip over various size coins. Pt returned to room and completed min A stand pivot transfer back to bed. Pt left in supine at end of session, all needs in reach and bed alarm on.   Therapy Documentation Precautions:  Precautions Precautions: Fall, Cervical Precaution Comments: Ok to doff brace in bed, when ambulating to bathroom, and while showering. C-collar applied in sitting position  Required Braces or Orthoses: Cervical Brace Cervical Brace: Hard collar Restrictions Weight Bearing Restrictions: No   Therapy/Group: Individual Therapy  Jen Eppinger L 03/07/2019, 6:56 AM

## 2019-03-08 ENCOUNTER — Inpatient Hospital Stay (HOSPITAL_COMMUNITY): Payer: PRIVATE HEALTH INSURANCE | Admitting: Occupational Therapy

## 2019-03-08 DIAGNOSIS — I951 Orthostatic hypotension: Secondary | ICD-10-CM

## 2019-03-08 DIAGNOSIS — N390 Urinary tract infection, site not specified: Secondary | ICD-10-CM

## 2019-03-08 DIAGNOSIS — D62 Acute posthemorrhagic anemia: Secondary | ICD-10-CM

## 2019-03-08 DIAGNOSIS — Z8679 Personal history of other diseases of the circulatory system: Secondary | ICD-10-CM

## 2019-03-08 NOTE — Progress Notes (Signed)
Occupational Therapy Session Note  Patient Details  Name: Denise Espinoza MRN: [id number] Date of Birth: May 02, 1930  Today's Date: 03/08/2019 OT Individual Time: 1450-1536 OT Individual Time Calculation (min): 46 min    Short Term Goals: Week 4:  OT Short Term Goal 1 (Week 4): STG=LTG due to LOS  Skilled Therapeutic Interventions/Progress Updates:    Pt completed transfer from recliner to the wheelchair with min hand held assist and was then taken down to the therapy gym.  There she transferred to the therapy mat at the same min assist level where she worked on [employer] and coordination.  Had pt work in picking up resistive clothespins and placing them on the horizontal bars in front of her.  She needed min assist to complete shoulder flexion to 90 degrees while manipulating the clothespins.  Min assist also needed to avoid trunk compensation during each repetition.  Next had her complete 1 set of 10 repetitions for shoulder flexion bilaterally with dowel rod and min assist to 90 degrees.  Finished session with return to the room via wheelchair and pt completing short distance functional mobility to the bed to rest with min hand held assist.  Pt left with call button and phone in reach with bed alarm in place.    Therapy Documentation Precautions:  Precautions Precautions: Fall, Cervical Precaution Comments: Ok to doff brace in bed, when ambulating to bathroom, and while showering. C-collar applied in sitting position  Required Braces or Orthoses: Cervical Brace Cervical Brace: Hard collar Restrictions Weight Bearing Restrictions: No  Pain: Pain Assessment Pain Scale: Faces Pain Score: 0-No pain ADL: See Care Tool Section for some details of mobility and ADL  Therapy/Group: Individual Therapy  Jaeshawn Silvio OTR/L 03/08/2019, 4:03 PM

## 2019-03-08 NOTE — Progress Notes (Signed)
[place] PHYSICAL MEDICINE & REHABILITATION PROGRESS NOTE   Subjective/Complaints: Patient seen laying in bed this morning.  She states she did not sleep well overnight because she could not get comfortable.  She has questions regarding her SCDs.  ROS: Denies CP, SOB, N/V/D  Objective:   No results found. No results for input(s): WBC, HGB, HCT, PLT in the last 72 hours. Recent Labs    03/05/19 1815  NA 137  K 4.7  CL 101  CO2 26  GLUCOSE 108*  BUN 19  CREATININE 0.79  CALCIUM 8.9    Intake/Output Summary (Last 24 hours) at 03/08/2019 0912 Last data filed at 03/08/2019 0800 Gross per 24 hour  Intake 480 ml  Output -  Net 480 ml     Physical Exam: Vital Signs Blood pressure (!) 113/58, pulse 78, temperature 98.1 F (36.7 C), temperature source Oral, resp. rate 18, height 5' 2.99" (1.6 m), weight 61.4 kg, SpO2 95 %. Constitutional: No distress . Vital signs reviewed. HENT: Normocephalic.  Atraumatic. Eyes: EOMI. No discharge. Cardiovascular: No JVD. Respiratory: Normal effort. GI: Non-distended. Musc: No edema or tenderness in extremities. Neurological:  Alert Motor:  Bilateral lower extremities: Grossly 4/5 proximal to distal  Skin: Skin iswarmand dry. Psych:pleasant   Assessment/Plan: 1. Functional deficits secondary to cervical myelopathy/central cord which require 3+ hours per day of interdisciplinary therapy in a comprehensive inpatient rehab setting.  Physiatrist is providing close team supervision and 24 hour management of active medical problems listed below.  Physiatrist and rehab team continue to assess barriers to discharge/monitor patient progress toward functional and medical goals  Care Tool:  Bathing    Body parts bathed by patient: Right arm, Left arm, Chest, Abdomen, Front perineal area, Right upper leg, Left upper leg, Face, Right lower leg, Left lower leg   Body parts bathed by helper: Buttocks     Bathing assist Assist Level:  Minimal Assistance - Patient > 75%     Upper Body Dressing/Undressing Upper body dressing   What is the patient wearing?: Orthosis, Button up shirt Orthosis activity level: Performed by helper  Upper body assist Assist Level: Moderate Assistance - Patient 50 - 74%    Lower Body Dressing/Undressing Lower body dressing      What is the patient wearing?: Pants, Incontinence brief     Lower body assist Assist for lower body dressing: Moderate Assistance - Patient 50 - 74%     Toileting Toileting    Toileting assist Assist for toileting: Minimal Assistance - Patient > 75%     Transfers Chair/bed transfer  Transfers assist     Chair/bed transfer assist level: Minimal Assistance - Patient > 75%     Locomotion Ambulation   Ambulation assist   Ambulation activity did not occur: Safety/medical concerns  Assist level: Moderate Assistance - Patient 50 - 74% Assistive device: Walker-rolling(R hand orthosis) Max distance: 45'   Walk 10 feet activity   Assist  Walk 10 feet activity did not occur: Safety/medical concerns  Assist level: Moderate Assistance - Patient - 50 - 74% Assistive device: Walker-rolling   Walk 50 feet activity   Assist Walk 50 feet with 2 turns activity did not occur: Safety/medical concerns  Assist level: Moderate Assistance - Patient - 50 - 74% Assistive device: Walker-rolling    Walk 150 feet activity   Assist Walk 150 feet activity did not occur: Safety/medical concerns         Walk 10 feet on uneven surface  activity  Assist Walk 10 feet on uneven surfaces activity did not occur: Safety/medical concerns         Wheelchair     Assist Will patient use wheelchair at discharge?: Yes Type of Wheelchair: Manual    Wheelchair assist level: Supervision/Verbal cueing Max wheelchair distance: 150'    Wheelchair 50 feet with 2 turns activity    Assist        Assist Level: Supervision/Verbal cueing    Wheelchair 150 feet activity     Assist     Assist Level: Supervision/Verbal cueing    Medical Problem List and Plan: 1.Functional and mobility deficitssecondary to C2 fracture and 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 Pt is s/p C1 laminectomy, C2 ORIF and Occiput to C4 PLIF Continue CIR, plan for SNF  -bilateral WHO's   Notes reviewed- cervical myelopathy, labs reviewed 2. Antithrombotics: -DVT/anticoagulation:Mechanical:Sequential compression devices, below kneeBilateral lower extremities. Dopplers negative -antiplatelet therapy: N/A 3. Pain Management:Oxycodone prn--premedicate prior to therapy. Gabapentin tid for neuropathy 4. Mood:Team to provide ego support. LCSW to follow for evaluation and support. -antipsychotic agents: N/A 5. Neuropsych: This patientiscapable of making decisions on herown behalf. 6. Skin/Wound care: Ordered air matress overlay for pressure relief. 7. Fluids/Electrolytes/Nutrition:encourage PO  BMP within acceptable range on 5/27 8. H/o WPV:[id number] metoprolol daily  Controlled on 5/30 9. Neurogenic bowel:   Relatively controlled on 5/30  10. Neurogenic bladder:Has been incontinent for years.   -time toileting to help improve continence 11.  Acute lower UTI  - resumed bactrim for 5 day course (from 5/27) to cover proteus in urine 12. Orthostasis/hypotension  -suspect driven by loss of sympathetic tone  -has been eating and drinking well  -TEDS/Abd binder  -consider florinef trial  Relatively controlled on 5/30 13.  Acute blood loss anemia  Hemoglobin 10.1 on 5/25   LOS: 21 days A FACE TO FACE EVALUATION WAS PERFORMED  Danyl Deems Karis Juba 03/08/2019, 9:12 AM

## 2019-03-09 NOTE — Progress Notes (Signed)
[place] PHYSICAL MEDICINE & REHABILITATION PROGRESS NOTE   Subjective/Complaints: Patient seen laying in bed this morning.  She states she slept well overnight.  She states she would like to get up with nursing today.  ROS: Denies CP, SOB, N/V/D  Objective:   No results found. No results for input(s): WBC, HGB, HCT, PLT in the last 72 hours. No results for input(s): NA, K, CL, CO2, GLUCOSE, BUN, CREATININE, CALCIUM in the last 72 hours.  Intake/Output Summary (Last 24 hours) at 03/09/2019 1130 Last data filed at 03/09/2019 1009 Gross per 24 hour  Intake 800 ml  Output 120 ml  Net 680 ml     Physical Exam: Vital Signs Blood pressure (!) 114/56, pulse 66, temperature 97.7 F (36.5 C), temperature source Oral, resp. rate 17, height 5' 2.99" (1.6 m), weight 61.4 kg, SpO2 98 %. Constitutional: No distress . Vital signs reviewed. HENT: Normocephalic.  Atraumatic. Eyes: EOMI.  No discharge. Cardiovascular: No JVD. Respiratory: Normal effort. GI: Non-distended. Musc: No edema or tenderness in extremities. Neurological: Alert Motor:  Bilateral lower extremities: Grossly 4/5 proximal to distal, stable  Skin: Skin iswarmand dry. Psych:pleasant   Assessment/Plan: 1. Functional deficits secondary to cervical myelopathy/central cord which require 3+ hours per day of interdisciplinary therapy in a comprehensive inpatient rehab setting.  Physiatrist is providing close team supervision and 24 hour management of active medical problems listed below.  Physiatrist and rehab team continue to assess barriers to discharge/monitor patient progress toward functional and medical goals  Care Tool:  Bathing    Body parts bathed by patient: Right arm, Left arm, Chest, Abdomen, Front perineal area, Right upper leg, Left upper leg, Face, Right lower leg, Left lower leg   Body parts bathed by helper: Buttocks     Bathing assist Assist Level: Minimal Assistance - Patient > 75%     Upper  Body Dressing/Undressing Upper body dressing   What is the patient wearing?: Orthosis, Button up shirt Orthosis activity level: Performed by helper  Upper body assist Assist Level: Moderate Assistance - Patient 50 - 74%    Lower Body Dressing/Undressing Lower body dressing      What is the patient wearing?: Pants, Incontinence brief     Lower body assist Assist for lower body dressing: Moderate Assistance - Patient 50 - 74%     Toileting Toileting    Toileting assist Assist for toileting: Minimal Assistance - Patient > 75%     Transfers Chair/bed transfer  Transfers assist     Chair/bed transfer assist level: Minimal Assistance - Patient > 75%     Locomotion Ambulation   Ambulation assist   Ambulation activity did not occur: Safety/medical concerns  Assist level: Minimal Assistance - Patient > 75% Assistive device: Hand held assist Max distance: 5'   Walk 10 feet activity   Assist  Walk 10 feet activity did not occur: Safety/medical concerns  Assist level: Moderate Assistance - Patient - 50 - 74% Assistive device: Walker-rolling   Walk 50 feet activity   Assist Walk 50 feet with 2 turns activity did not occur: Safety/medical concerns  Assist level: Moderate Assistance - Patient - 50 - 74% Assistive device: Walker-rolling    Walk 150 feet activity   Assist Walk 150 feet activity did not occur: Safety/medical concerns         Walk 10 feet on uneven surface  activity   Assist Walk 10 feet on uneven surfaces activity did not occur: Safety/medical concerns  Wheelchair     Assist Will patient use wheelchair at discharge?: Yes Type of Wheelchair: Manual    Wheelchair assist level: Supervision/Verbal cueing Max wheelchair distance: 150'    Wheelchair 50 feet with 2 turns activity    Assist        Assist Level: Supervision/Verbal cueing   Wheelchair 150 feet activity     Assist     Assist Level:  Supervision/Verbal cueing    Medical Problem List and Plan: 1.Functional and mobility deficitssecondary to C2 fracture and 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 Pt is s/p C1 laminectomy, C2 ORIF and Occiput to C4 PLIF Continue CIR, plan for SNF  -bilateral WHO's  2. Antithrombotics: -DVT/anticoagulation:Mechanical:Sequential compression devices, below kneeBilateral lower extremities. Dopplers negative -antiplatelet therapy: N/A 3. Pain Management:Oxycodone prn--premedicate prior to therapy. Gabapentin tid for neuropathy 4. Mood:Team to provide ego support. LCSW to follow for evaluation and support. -antipsychotic agents: N/A 5. Neuropsych: This patientiscapable of making decisions on herown behalf. 6. Skin/Wound care: Ordered air matress overlay for pressure relief. 7. Fluids/Electrolytes/Nutrition:encourage PO  BMP within acceptable range on 5/27 8. H/o ZOX:[id number]:Continue metoprolol daily  Controlled on 5/31 9. Neurogenic bowel:   Relatively controlled on 5/30  10. Neurogenic bladder:Has been incontinent for years.   -time toileting to help improve continence 11.  Acute lower UTI  - resumed Bactrim for 5 day course (from 5/27) to cover proteus in urine 12. Orthostasis/hypotension  -suspect driven by loss of sympathetic tone  -has been eating and drinking well  -TEDS/Abd binder  -consider florinef trial  Labile on 5/31 13.  Acute blood loss anemia  Hemoglobin 10.1 on 5/25, labs ordered for tomorrow   LOS: 22 days A FACE TO FACE EVALUATION WAS PERFORMED  Jovita Persing Karis Jubanil Eithel Ryall 03/09/2019, 11:30 AM

## 2019-03-10 ENCOUNTER — Inpatient Hospital Stay (HOSPITAL_COMMUNITY): Payer: PRIVATE HEALTH INSURANCE | Admitting: Occupational Therapy

## 2019-03-10 ENCOUNTER — Inpatient Hospital Stay (HOSPITAL_COMMUNITY): Payer: PRIVATE HEALTH INSURANCE | Admitting: Physical Therapy

## 2019-03-10 LAB — CBC WITH DIFFERENTIAL/PLATELET
Abs Immature Granulocytes: 0.02 10*3/uL (ref 0.00–0.07)
Basophils Absolute: 0 10*3/uL (ref 0.0–0.1)
Basophils Relative: 0 %
Eosinophils Absolute: 0.1 10*3/uL (ref 0.0–0.5)
Eosinophils Relative: 1 %
HCT: 35.7 % — ABNORMAL LOW (ref 36.0–46.0)
Hemoglobin: 11.1 g/dL — ABNORMAL LOW (ref 12.0–15.0)
Immature Granulocytes: 0 %
Lymphocytes Relative: 25 %
Lymphs Abs: 1.3 10*3/uL (ref 0.7–4.0)
MCH: 29.1 pg (ref 26.0–34.0)
MCHC: 31.1 g/dL (ref 30.0–36.0)
MCV: 93.7 fL (ref 80.0–100.0)
Monocytes Absolute: 0.2 10*3/uL (ref 0.1–1.0)
Monocytes Relative: 4 %
Neutro Abs: 3.5 10*3/uL (ref 1.7–7.7)
Neutrophils Relative %: 70 %
Platelets: 229 10*3/uL (ref 150–400)
RBC: 3.81 MIL/uL — ABNORMAL LOW (ref 3.87–5.11)
RDW: 13.5 % (ref 11.5–15.5)
WBC: 5.1 10*3/uL (ref 4.0–10.5)
nRBC: 0 % (ref 0.0–0.2)

## 2019-03-10 LAB — BASIC METABOLIC PANEL
Anion gap: 11 (ref 5–15)
BUN: 16 mg/dL (ref 8–23)
CO2: 25 mmol/L (ref 22–32)
Calcium: 9.2 mg/dL (ref 8.9–10.3)
Chloride: 101 mmol/L (ref 98–111)
Creatinine, Ser: 0.86 mg/dL (ref 0.44–1.00)
GFR calc Af Amer: >60 mL/min (ref >60)
GFR calc non Af Amer: >60 mL/min (ref >60)
Glucose, Bld: 100 mg/dL — ABNORMAL HIGH (ref 70–99)
Potassium: 4.2 mmol/L (ref 3.5–5.1)
Sodium: 137 mmol/L (ref 135–145)

## 2019-03-10 MED ORDER — OXYCODONE HCL 5 MG PO TABS: 5.0000 mg | ORAL_TABLET | ORAL | 0 refills | Status: DC | PRN

## 2019-03-10 MED ORDER — TRAMADOL HCL 50 MG PO TABS: 50.0000 mg | ORAL_TABLET | Freq: Four times a day (QID) | ORAL | Status: DC | PRN

## 2019-03-10 MED ORDER — PANTOPRAZOLE SODIUM 40 MG PO TBEC: 40.0000 mg | DELAYED_RELEASE_TABLET | Freq: Two times a day (BID) | ORAL | Status: DC

## 2019-03-10 MED ORDER — SENNOSIDES-DOCUSATE SODIUM 8.6-50 MG PO TABS: 2.0000 | ORAL_TABLET | Freq: Two times a day (BID) | ORAL | Status: DC

## 2019-03-10 MED ORDER — ACETAMINOPHEN 325 MG PO TABS: 325.0000 mg | ORAL_TABLET | ORAL | Status: AC | PRN

## 2019-03-10 MED ORDER — OXYCODONE HCL 10 MG PO TABS: 10.0000 mg | ORAL_TABLET | Freq: Two times a day (BID) | ORAL | 0 refills | Status: DC

## 2019-03-10 MED ORDER — BISACODYL 10 MG RE SUPP: 10.0000 mg | Freq: Every day | RECTAL | 0 refills | Status: DC

## 2019-03-10 MED ORDER — DICLOFENAC SODIUM 1 % TD GEL: 2.0000 g | Freq: Four times a day (QID) | TRANSDERMAL | Status: DC

## 2019-03-10 NOTE — Progress Notes (Signed)
Occupational Therapy Session Note  Patient Details  Name: Denise Espinoza MRN: [id number] Date of Birth: 08/10/1930  Today's Date: 03/10/2019 OT Individual Time: 1030-1130 OT Individual Time Calculation (min): 60 min    Short Term Goals: Week 4:  OT Short Term Goal 1 (Week 4): STG=LTG due to LOS  Skilled Therapeutic Interventions/Progress Updates:    Pt seen for OT session focusing on fine motor coordination in prep for functional tasks. Pt sitting up in w/c upon arrival, agreeable to tx session. She denied pain upon arrival, however, complaints of shoulder pain with extensive use of UEs throughout session. RN made aware and administered pain medication at end of session. Pt completed grooming tasks from w/c level at sink with assist for set-up. She self propelled w/c to therapy gym using B LEs with min A. Orthostatic vitals assessed:   Sitting in w/c: 120/56  Stating: 64/50 Pt reports orthostatic symptoms have subsided compared to last week.  Completed 9 hole peg test:   R: Placed 8 pegs after 6 min 45 sec. Stopped assessment due to R shoulder pain  L: 2 min 36 sec  Completed medium size peg board activity with emphasis on in-hand manipulation to turn pegs within hand using R and L UEs.   Returned to room and pt noted to have been incontinent of urine upon standing. Transitioned to [facility], however, unable to void anymore. She stood to complete pericare with steadying assist, new brief donned total A. Pt left seated in recliner at end of session, all needs in reach.   Therapy Documentation Precautions:  Precautions Precautions: Fall, Cervical Precaution Comments: Ok to doff brace in bed, when ambulating to bathroom, and while showering. C-collar applied in sitting position  Required Braces or Orthoses: Cervical Brace Cervical Brace: Hard collar Restrictions Weight Bearing Restrictions: No   Therapy/Group: Individual Therapy  Keianna Signer L 03/10/2019, 7:05 AM

## 2019-03-10 NOTE — Progress Notes (Signed)
[facility] PHYSICAL MEDICINE & REHABILITATION PROGRESS NOTE   Subjective/Complaints: No new issues. Using her hands more and more.   ROS: Patient denies fever, rash, sore throat, blurred vision, nausea, vomiting, diarrhea, cough, shortness of breath or chest pain, joint or back pain, headache, or mood change.    Objective:   No results found. No results for input(s): WBC, HGB, HCT, PLT in the last 72 hours. No results for input(s): NA, K, CL, CO2, GLUCOSE, BUN, CREATININE, CALCIUM in the last 72 hours.  Intake/Output Summary (Last 24 hours) at 03/10/2019 0930 Last data filed at 03/09/2019 1700 Gross per 24 hour  Intake 480 ml  Output 120 ml  Net 360 ml     Physical Exam: Vital Signs Blood pressure (!) 109/56, pulse 72, temperature 98.3 F (36.8 C), temperature source Oral, resp. rate 16, height 5' 2.99" (1.6 m), weight 62.5 kg, SpO2 99 %. Constitutional: No distress . Vital signs reviewed. HEENT: EOMI, oral membranes moist Neck: supple Cardiovascular: RRR without murmur. No JVD    Respiratory: CTA Bilaterally without wheezes or rales. Normal effort    GI: BS +, non-tender, non-distended  Musc: No edema or tenderness in extremities. Neurological: Alert Motor:  Bilateral lower extremities: Grossly 4/5 proximal to distal. LUE 4-/5, RUE 3/5. Decreased sensory below C5  Skin: Skin iswarmand dry. Psych:pleasant   Assessment/Plan: 1. Functional deficits secondary to cervical myelopathy/central cord which require 3+ hours per day of interdisciplinary therapy in a comprehensive inpatient rehab setting.  Physiatrist is providing close team supervision and 24 hour management of active medical problems listed below.  Physiatrist and rehab team continue to assess barriers to discharge/monitor patient progress toward functional and medical goals  Care Tool:  Bathing    Body parts bathed by patient: Right arm, Left arm, Chest, Abdomen, Front perineal area, Right upper leg, Left  upper leg, Face, Right lower leg, Left lower leg   Body parts bathed by helper: Buttocks     Bathing assist Assist Level: Minimal Assistance - Patient > 75%     Upper Body Dressing/Undressing Upper body dressing   What is the patient wearing?: Orthosis, Button up shirt Orthosis activity level: Performed by helper  Upper body assist Assist Level: Moderate Assistance - Patient 50 - 74%    Lower Body Dressing/Undressing Lower body dressing      What is the patient wearing?: Pants, Incontinence brief     Lower body assist Assist for lower body dressing: Moderate Assistance - Patient 50 - 74%     Toileting Toileting    Toileting assist Assist for toileting: Minimal Assistance - Patient > 75%     Transfers Chair/bed transfer  Transfers assist     Chair/bed transfer assist level: Minimal Assistance - Patient > 75%     Locomotion Ambulation   Ambulation assist   Ambulation activity did not occur: Safety/medical concerns  Assist level: Minimal Assistance - Patient > 75% Assistive device: Hand held assist Max distance: 5'   Walk 10 feet activity   Assist  Walk 10 feet activity did not occur: Safety/medical concerns  Assist level: Moderate Assistance - Patient - 50 - 74% Assistive device: Walker-rolling   Walk 50 feet activity   Assist Walk 50 feet with 2 turns activity did not occur: Safety/medical concerns  Assist level: Moderate Assistance - Patient - 50 - 74% Assistive device: Walker-rolling    Walk 150 feet activity   Assist Walk 150 feet activity did not occur: Safety/medical concerns  Walk 10 feet on uneven surface  activity   Assist Walk 10 feet on uneven surfaces activity did not occur: Safety/medical concerns         Wheelchair     Assist Will patient use wheelchair at discharge?: Yes Type of Wheelchair: Manual    Wheelchair assist level: Supervision/Verbal cueing Max wheelchair distance: 150'    Wheelchair 50  feet with 2 turns activity    Assist        Assist Level: Supervision/Verbal cueing   Wheelchair 150 feet activity     Assist     Assist Level: Supervision/Verbal cueing    Medical Problem List and Plan: 1.Functional and mobility deficitssecondary to C2 fracture and 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 Pt is s/p C1 laminectomy, C2 ORIF and Occiput to C4 PLIF Continue CIR, plan for SNF  -bilateral WHO's  2. Antithrombotics: -DVT/anticoagulation:Mechanical:Sequential compression devices, below kneeBilateral lower extremities. Dopplers negative -antiplatelet therapy: N/A 3. Pain Management:Oxycodone prn--premedicate prior to therapy. Gabapentin tid for neuropathy 4. Mood:Team to provide ego support. LCSW to follow for evaluation and support. -antipsychotic agents: N/A 5. Neuropsych: This patientiscapable of making decisions on herown behalf. 6. Skin/Wound care: Ordered air matress overlay for pressure relief. 7. Fluids/Electrolytes/Nutrition:encourage PO   8. H/o DEY:CXKGYJEH metoprolol daily  Controlled on 6/1 9. Neurogenic bowel:   Relatively controlled on 6/1 10. Neurogenic bladder:Has been incontinent for years.   -time toileting to help improve continence 11.  Acute lower UTI  -Bactrim completed 12. Orthostasis/hypotension  -loss of sympathetic tone  -has been eating and drinking well  -TEDS/Abd binder  -consider florinef trial if symptomatic    13.  Acute blood loss anemia  Hemoglobin 10.1 on 5/25, labs pending today   LOS: 23 days A FACE TO FACE EVALUATION WAS PERFORMED  Denise Espinoza 03/10/2019, 9:30 AM

## 2019-03-10 NOTE — Discharge Summary (Signed)
Physician Discharge Summary  Patient ID: Denise Espinoza MRN: [id number] DOB/AGE: 1930-09-23 85 y.o.  Admit date: 02/15/2019 Discharge date: 03/12/2019  Discharge Diagnoses:  Principal Problem:   Central cord syndrome [facility]) Active Problems:   Acute lower UTI   Acute blood loss anemia   Orthostatic hypotension   History of supraventricular tachycardia   Discharged Condition:  stable  Significant Diagnostic Studies: Mr Cervical Spine Wo Contrast  Result Date: 02/12/2019 CLINICAL DATA:  Previous C1 laminectomy and occiput put to C4 fusion. Previous chronic displaced C2 fracture. EXAM: MRI CERVICAL SPINE WITHOUT CONTRAST TECHNIQUE: Multiplanar, multisequence MR imaging of the cervical spine was performed. No intravenous contrast was administered. COMPARISON:  MRI 02/04/2019.  CT 02/10/2019. FINDINGS: Alignment: 5 mm anterolisthesis of C1 and the fracture fragment of the dens relative to the body of C2 as shown on the postsurgical CT. Straightening of the normal cervical lordosis below that. 2 mm degenerative anterolisthesis C7-T1 as seen previously Vertebrae: There has been interval laminectomy C1. Interval occiput put to C4 fusion procedure with occipital screws and posterior element screws at C2, C3 and C4. It appears that the right C2 laminar screw previously shown in the posterior canal has been removed. Other hardware appears unchanged as seen with MRI, with limited spatial resolution. In looking at the CT, it is possible that the posterior element screws at least on the left at C3 and possibly on the right may have only minimal purchase. There appears to be minimal purchase of the right screw at C4. Cord: Continued spinal stenosis at the foramen magnum and C1-2 with AP diameter of the canal as narrow as 3.4 mm, effacement of the subarachnoid space and flattening of the cord with low level T2 signal in the cord. Spinal stenosis also in the region from C2 through C6-7 with effacement of the  subarachnoid space and some flattening of the cord. Posterior Fossa, vertebral arteries, paraspinal tissues: Otherwise negative Disc levels: As noted above, there is persistent spinal stenosis at the foramen magnum/C1-2 level with AP diameter of the canal as narrow as 3.4 mm and cord flattening with some abnormal T2 signal in the cord. Stenosis at C2-3 shows AP canal dimension of 7.5 mm. Stenosis at C3-4 shows AP canal diameter of 5.5 mm. Stenosis at C4-5 shows AP canal diameter of 5.5 mm. C5-6 shows AP canal diameter 7 mm. C6-7 shows AP canal diameter of 6.2 mm. Stenosis at C7-T1 shows AP diameter of 6.3 mm. IMPRESSION: Apparent interval removal the right laminar screw at C2 which appeared to intrude upon the canal. Continued 5 mm anterolisthesis of C1 and the nonunited dens fragment relative to the body of C2. Continued canal stenosis with AP diameter of the canal as narrow as 3.4 mm, flattening of the cord and abnormal T2 cord signal. In this patient has had interval laminectomy at C1, it is difficult to explain how the AP diameter of the canal actually measures smaller than on the pre-surgical exam. This could relate to postoperative edema the posterior tissues. Lesser but potentially significant stenosis from C2-3 through C7-T1 as outlined above. Electronically Signed   By: Paulina Fusi M.D.   On: 02/12/2019 10:57   Dg Chest Port 1 View  Result Date: 02/16/2019 CLINICAL DATA:  Fevers EXAM: PORTABLE CHEST 1 VIEW COMPARISON:  02/12/2017 FINDINGS: Cardiac shadows within normal limits. Aortic calcifications are again noted and stable. The lungs are clear. Mild basilar atelectasis is noted on the left. No acute bony abnormality is seen. IMPRESSION: Mild left basilar  atelectasis. Electronically Signed   By: Alcide Clever M.D.   On: 02/16/2019 17:34   Vas [place] Lower Extremity Venous (dvt)  Result Date: 02/18/2019  Lower Venous Study Indications: Pain.  Performing Technologist: Jeb Levering RDMS, RVT  Examination  Guidelines: A complete evaluation includes B-mode imaging, spectral Doppler, color Doppler, and power Doppler as needed of all accessible portions of each vessel. Bilateral testing is considered an integral part of a complete examination. Limited examinations for reoccurring indications may be performed as noted.  +---------+---------------+---------+-----------+----------+-------+ RIGHT    CompressibilityPhasicitySpontaneityPropertiesSummary +---------+---------------+---------+-----------+----------+-------+ CFV      Full           Yes      Yes                          +---------+---------------+---------+-----------+----------+-------+ SFJ      Full                                                 +---------+---------------+---------+-----------+----------+-------+ FV Prox  Full                                                 +---------+---------------+---------+-----------+----------+-------+ FV Mid   Full                                                 +---------+---------------+---------+-----------+----------+-------+ FV DistalFull                                                 +---------+---------------+---------+-----------+----------+-------+ PFV      Full                                                 +---------+---------------+---------+-----------+----------+-------+ POP      Full           Yes      Yes                          +---------+---------------+---------+-----------+----------+-------+ PTV      Full                                                 +---------+---------------+---------+-----------+----------+-------+ PERO     Full                                                 +---------+---------------+---------+-----------+----------+-------+   +---------+---------------+---------+-----------+----------+-------+ LEFT     CompressibilityPhasicitySpontaneityPropertiesSummary  +---------+---------------+---------+-----------+----------+-------+ CFV      Full  Yes      Yes                          +---------+---------------+---------+-----------+----------+-------+ SFJ      Full                                                 +---------+---------------+---------+-----------+----------+-------+ FV Prox  Full                                                 +---------+---------------+---------+-----------+----------+-------+ FV Mid   Full                                                 +---------+---------------+---------+-----------+----------+-------+ FV DistalFull                                                 +---------+---------------+---------+-----------+----------+-------+ PFV      Full                                                 +---------+---------------+---------+-----------+----------+-------+ POP      Full           Yes      Yes                          +---------+---------------+---------+-----------+----------+-------+ PTV      Full                                                 +---------+---------------+---------+-----------+----------+-------+ PERO     Full                                                 +---------+---------------+---------+-----------+----------+-------+     Summary: Right: There is no evidence of deep vein thrombosis in the lower extremity. A cystic structure is found in the popliteal fossa. Left: There is no evidence of deep vein thrombosis in the lower extremity. No cystic structure found in the popliteal fossa.  *See table(s) above for measurements and observations. Electronically signed by Fabienne Brunsharles Fields MD on 02/18/2019 at 9:42:42 AM.    Final     Labs:  Basic Metabolic Panel: BMP Latest Ref Rng & Units 03/10/2019 03/05/2019 03/03/2019  Glucose 70 - 99 mg/dL 161(W100(H) 960(A108(H) 98  BUN 8 - 23 mg/dL 16 19 15   Creatinine 0.44 - 1.00 mg/dL 5.400.86 9.810.79 1.910.59  Sodium 135 - 145 mmol/L  137 137 137  Potassium 3.5 - 5.1 mmol/L 4.2 4.7 3.7  Chloride 98 - 111 mmol/L 101 101 99  CO2 22 - 32 mmol/L 25 26 27   Calcium 8.9 - 10.3 mg/dL 9.2 8.9 9.1    CBC: CBC Latest Ref Rng & Units 03/10/2019 03/03/2019 02/23/2019  WBC 4.0 - 10.5 K/uL 5.1 5.1 7.7  Hemoglobin 12.0 - 15.0 g/dL 11.1(L) 10.1(L) 10.5(L)  Hematocrit 36.0 - 46.0 % 35.7(L) 30.9(L) 33.5(L)  Platelets 150 - 400 K/uL 229 184 220    CBG: No results for input(s): GLUCAP in the last 168 hours.  Brief HPI:   Denise PilarOpal Moradi is an 85 year old female with history of HTN, anosomia, SVT, months of BLE instability who [facility] with reports of fall 3 weeks PTA with onset of RUE>LUE numbness and weakness.  She was found to have displaced odontoid fracture with severe cervical spinal stenosis C1/C2 and C3/C4.  She underwent C1 laminectomy for decompression of spinal cord with ORIF C2 fracture and PLF occiput to C2 by Dr. Conchita ParisNundkumar on 02/06/2019.  She was making good progress but developed severe neck pain on 5/2 with bilateral lower extremity instability.  Follow-up CT spine showed C2 laminar screw intruding on posterior spinal canal and right lateral recess.    She was taken to the OR for removal of C2 translaminar screw on 5/5 but continued to have decline in bilateral upper extremity strength with worsening of numbness and weakness.  Follow-up MRI showed removal of C2 screw with continued anterolisthesis of C1 on nonunited dens fragment with continued canal stenosis and flattening of cord with abnormal signal and surrounding edema.  Symptoms felt to be due to edema and she was started on Medrol Dosepak.  Therapy has been ongoing but patient continued to be limited by cervical myelopathy with tetraplegia, problems with initiation, anxiety as well as pain affecting ADLs and mobility.  CIR was recommended to decrease burden of care.    Hospital Course: Denise Espinoza was admitted to rehab 02/15/2019 for inpatient therapies to  consist of PT, ST and OT at least three hours five days a week. Past admission physiatrist, therapy team and rehab RN have worked together to provide customized collaborative inpatient rehab. BLE dopplers were negative for DVT and SCDs were used for DVT prophylaxis. She has had issues with dizziness due to orthostatic hypotension despite use of abdominal binder and BLE compression with ace wraps.  Florinef was added on 06/2 due to ongoing issues with hypotension. She has had improvement in strength, functional use and coordination of BUE/BLE. Heart rate has been controlled on metoprolol. She was started on bowel program and is continent of bowel with current regimen. She had issues with incontinence PTA and scheduled toileting has helped to decrease incontinent episodes.    She developed low-grade fevers and was found to have proteus Mirabilis UTI on 5/23 and was treated with septra DS X 7 days.  Fevers have resolved with treatment of UTI. Neck incision is C/D/I and staples were removed on 6/2.  Her anxiety levels have improved with ego support and neurological improvement.  Follow up CBC showed that ABLA is improving and renal status is stable. Neuropathy has been managed with  Gabapentin tid. She is being premedicated twice a day with oxycodone prior to therapy sessions to help with tolerance of activity.  She has been making slow steady gains and has progressed to min-mod assist level. Family is unable to provide extensive care needed and has elected on SNF. She was [facility] to [facility] for further  therapy on 03/12/19    Rehab course: During patient's stay in rehab weekly team conferences were held to monitor patient's progress, set goals and discuss barriers to discharge. At admission, patient required +2 total assist with ADL and for mobility.  She  has had improvement in activity tolerance, balance, postural control as well as ability to compensate for deficits. She has had improvement in functional  use RUE/LUE  and RLE/LLE as well as improvement in awareness.  She requires set up assist to feed self and for grooming tasks. She requires min assist to complete upper body ADLs and max to total assist for lower body ADLs. She requires min assist for bed mobility, stand pivot transfers and for standing balance. She is able to ambulate 86 feet with RW with right hand orthosis and min assist.    Disposition: [facility].   Diet: Regular   Special Instructions: 1. Continue hard collar at all times. 2. Toilet patient every 3-4 hours while awake.  3. Keep HOB elevated at 30 degrees. Needs to abdominal binder when up and use ace wraps on BLE for compression.    Discharge Instructions    Ambulatory referral to Physical Medicine Rehab   Complete by:  As directed    4-5 weeks follow up appointment     Allergies as of 03/12/2019      Reactions   Penicillins Hives, Swelling, Rash, Other (See Comments)   Did it involve swelling of the face/tongue/throat, SOB, or low BP? #  #  #  YES  #  #  #  Did it involve sudden or severe rash/hives, skin peeling, or any reaction on the inside of your mouth or nose? No Did you need to seek medical attention at a hospital or doctor's office? #  #  #  YES  #  #  #  When did it last happen?85 years old.   Codeine Phosphate [codeine] Other (See Comments)   headache      Medication List    STOP taking these medications   denosumab 60 MG/ML Soln injection Commonly known as:  PROLIA   docusate sodium 100 MG capsule Commonly known as:  COLACE   HYDROcodone-acetaminophen 5-325 MG tablet Commonly known as:  NORCO/VICODIN   traZODone 50 MG tablet Commonly known as:  DESYREL     TAKE these medications   acetaminophen 325 MG tablet Commonly known as:  TYLENOL Take 1-2 tablets (325-650 mg total) by mouth every 4 (four) hours as needed for mild pain.   bisacodyl 10 MG suppository Commonly known as:  DULCOLAX Place 1 suppository (10 mg total)  rectally daily at 6 (six) AM.   calcium-vitamin D 500-200 MG-UNIT tablet Commonly known as:  OSCAL WITH D Take 1 tablet by mouth 2 (two) times daily.   diclofenac sodium 1 % Gel Commonly known as:  VOLTAREN Apply 2 g topically 4 (four) times daily.   fludrocortisone 0.1 MG tablet Commonly known as:  FLORINEF Take 1 tablet (0.1 mg total) by mouth daily. Start taking on:  March 13, 2019   gabapentin 300 MG capsule Commonly known as:  NEURONTIN Take 1 capsule (300 mg total) by mouth 3 (three) times daily.   metoprolol succinate 25 MG 24 hr tablet Commonly known as:  TOPROL-XL Take 12.5 mg by mouth daily.   oxyCODONE 5 MG immediate release tablet--Rx # 15 pills  Commonly known as:  Oxy IR/ROXICODONE Take 1-2 tablets (5-10 mg total) by mouth every 3 (three) hours as  needed for severe pain.   Oxycodone HCl 10 MG Tabs Take 1 tablet (10 mg total) by mouth 2 (two) times daily with breakfast and lunch.   pantoprazole 40 MG tablet Commonly known as:  PROTONIX Take 1 tablet (40 mg total) by mouth 2 (two) times daily.   rosuvastatin 5 MG tablet Commonly known as:  CRESTOR Take 5 mg by mouth daily.   senna-docusate 8.6-50 MG tablet Commonly known as:  Senokot-S Take 2 tablets by mouth 2 (two) times daily.   traMADol 50 MG tablet-Commonly known as:  ULTRAM--Rx # 15 pills Take 1 tablet (50 mg total) by mouth every 6 (six) hours as needed for moderate pain.       Contact information for follow-up providers    Ranelle Oyster, MD Follow up.   Specialty:  Physical Medicine and Rehabilitation Why:  Office will call you with follow up appointment Contact information: [address] [place] [address] [phone]        Lisbeth Renshaw, MD. Call.   Specialty:  Neurosurgery Why:  for post op appointment Contact information: [address] [place] [address] [phone]            Contact information for after-discharge care     Destination    [facility]erred SNF .   Service:  Skilled Nursing Contact information: [address] [place] [address] [phone]                  Signed: Jacquelynn Cree 03/12/2019, 9:59 AM

## 2019-03-10 NOTE — Progress Notes (Signed)
Occupational Therapy Session Note  Patient Details  Name: Denise Espinoza MRN: [id number] Date of Birth: 05-22-1930  Today's Date: 03/10/2019 OT Individual Time: 1423-9532 OT Individual Time Calculation (min): 70 min    Short Term Goals: Week 4:  OT Short Term Goal 1 (Week 4): STG=LTG due to LOS  Skilled Therapeutic Interventions/Progress Updates:    Upon entering the room, pt supine in bed with no c/o pain but requests to use bathroom. OT provided total A to don B LE TED hose and ACE wrap for BP management as well as hard collar donned for safety.  Supine >sit with min A to EOB. Pt standing with min HHA stand pivot onto BSC and having large BM. Pt able to wipe peri area with set up but needing assistance to for buttocks. Pt donning pants with min A for sitting balance to thread pants onto feet while seated on commode. Pt standing and therapist provided total A to don brief and pull pants over B hips. Pt returning to sit on EOB. Abdominal binder donned and pt transferred into wheelchair in same manner as above. Pt seated in chair at sink for grooming tasks with set up A. Chair alarm belt donned and activated. Breakfast tray set up to open drink containers and water bottle. Pt able to handle all other food items herself with minimal spillage noted. Soft call bell within reach as well as any other needs upon exiting the room.  Therapy Documentation Precautions:  Precautions Precautions: Fall, Cervical Precaution Comments: Ok to doff brace in bed, when ambulating to bathroom, and while showering. C-collar applied in sitting position  Required Braces or Orthoses: Cervical Brace Cervical Brace: Hard collar Restrictions Weight Bearing Restrictions: No Vital Signs: Therapy Vitals Temp: 98.3 F (36.8 C) Temp Source: Oral Pulse Rate: 72 Resp: 16 BP: (!) 109/56 Patient Position (if appropriate): Lying Oxygen Therapy SpO2: 99 % O2 Device: Room Air ADL: ADL Eating: Not assessed Grooming:  Dependent Where Assessed-Grooming: Bed level Upper Body Bathing: Dependent Where Assessed-Upper Body Bathing: Edge of bed Lower Body Bathing: Other (comment)(2 assist) Where Assessed-Lower Body Bathing: Bed level Upper Body Dressing: Dependent Where Assessed-Upper Body Dressing: Bed level Lower Body Dressing: Other (Comment)(2 assist) Where Assessed-Lower Body Dressing: Bed level Toileting: Other (Comment)(2 assist) Where Assessed-Toileting: Bed level Toilet Transfer: Not assessed Tub/Shower Transfer: Not assessed   Therapy/Group: Individual Therapy  Alen Bleacher 03/10/2019, 8:58 AM

## 2019-03-10 NOTE — Progress Notes (Signed)
Physical Therapy Weekly Progress Note  Patient Details  Name: Denise Espinoza MRN: [id number] Date of Birth: March 25, 1930  Beginning of progress report period: Mar 03, 2019 End of progress report period: March 10, 2019  Today's Date: 03/10/2019 PT Individual Time: 1200-1230; 1330-1430 PT Individual Time Calculation (min): 30 min and 60 min  Patient has met 0 of 1 short term goals.  STG were set as LTG due to ELOS. Plan is for patient to d/c to SNF within the next week. Pt continues to make progress towards LTG which were upgraded to min A overall. Pt is currently at min to mod A for bed mobility, SPT, and mod A for short distance gait up to 50 ft with use of a RW with a R hand orthosis. Pt is also able to propel a manual w/c with use of BLE and Supervision.  Patient continues to demonstrate the following deficits muscle weakness, abnormal tone, unbalanced muscle activation and decreased coordination and decreased standing balance, decreased postural control and decreased balance strategies and therefore will continue to benefit from skilled PT intervention to increase functional independence with mobility.  Patient progressing toward long term goals..  Continue plan of care.  PT Short Term Goals Week 3:  PT Short Term Goal 1 (Week 3): =LTG due to ELOS PT Short Term Goal 1 - Progress (Week 3): Progressing toward goal Week 4:  PT Short Term Goal 1 (Week 4): =LTG due to ELOS  Skilled Therapeutic Interventions/Progress Updates:    Session 1: Pt received seated in recliner in room, agreeable to PT session. No complaints of pain. Seated BP 122/66. Pt has on abdominal binder, TED hose, and has BLE ACE wrapped. Sit to stand with mod A with increase in posterior lean in standing this date. Standing BP 89/42 but pt asymptomatic. Returned to sitting. Pt reports she soiled her w/c cushion earlier this date. Assisted pt with washing w/c cushion in washing machine. Pt left seated in recliner in room with needs in  reach at end of session.  Session 2: Pt received seated in recliner, agreeable to PT session. No complaints of pain. Pt reports urge to urinate. Stand pivot transfer recliner to bedside commode with min A. Pt is dependent for brief change, clothing management, and pericare standing with RW. Stand pivot transfer commode to recliner then recliner to w/c with min A. Assisted pt with calling in her dinner and breakfast order, pt is able to hold the phone in her LUE and verbalize her meal preferences, pt requires assist to dial number to cafeteria. Ambulation 2 x 40 ft with RW and min A with close w/c follow for safety. Pt ambulates with ataxic gait pattern, path deviation to the L, and decreased B step length. Seated BP following gait 141/68 and then 131/71 with abdomina binder, TEDS, BLE ACE wrap. Pt requests to return to bed at end of session. Stand pivot transfer w/c to bed with min A. Sit to supine min A. Pt left semi-reclined in bed with needs in reach and bed alarm in place at end of session.   Therapy Documentation Precautions:  Precautions Precautions: Fall, Cervical Precaution Comments: Ok to doff brace in bed, when ambulating to bathroom, and while showering. C-collar applied in sitting position  Required Braces or Orthoses: Cervical Brace Cervical Brace: Hard collar Restrictions Weight Bearing Restrictions: No    Therapy/Group: Individual Therapy   Excell Seltzer, PT, DPT  03/10/2019, 12:50 PM

## 2019-03-10 NOTE — Progress Notes (Signed)
Social Work Patient ID: Denise Espinoza, female   DOB: 09/11/1930, 85 y.o.   MRN: [id number]   Have received SNF bed offer from [facility] who can admit her on Wednesday.  MD and tx team aware that pt has accepted bed offer.  Have also alerted family.  Kallin Henk, LCSW

## 2019-03-11 ENCOUNTER — Inpatient Hospital Stay (HOSPITAL_COMMUNITY): Payer: PRIVATE HEALTH INSURANCE | Admitting: Physical Therapy

## 2019-03-11 ENCOUNTER — Inpatient Hospital Stay (HOSPITAL_COMMUNITY): Payer: PRIVATE HEALTH INSURANCE | Admitting: Occupational Therapy

## 2019-03-11 ENCOUNTER — Encounter (HOSPITAL_COMMUNITY): Payer: PRIVATE HEALTH INSURANCE | Admitting: Psychology

## 2019-03-11 LAB — NOVEL CORONAVIRUS, NAA (HOSP ORDER, SEND-OUT TO REF LAB; TAT 18-24 HRS): SARS-CoV-2, NAA: NOT DETECTED

## 2019-03-11 MED ORDER — FLUDROCORTISONE ACETATE 0.1 MG PO TABS
0.1000 mg | ORAL_TABLET | Freq: Every day | ORAL | Status: DC
  Administered 2019-03-11 – 2019-03-12 (×2): 0.1 mg via ORAL
  Filled 2019-03-11 (×2): qty 1

## 2019-03-11 NOTE — Progress Notes (Signed)
Occupational Therapy Discharge Summary  Patient Details  Name: Denise Espinoza MRN: [id number] Date of Birth: 15-Feb-1930    Patient has met 74 of 10 long term goals due to improved activity tolerance, improved balance, postural control, ability to compensate for deficits, functional use of  RIGHT upper and RIGHT lower extremity and improved coordination.  Patient to discharge at [facility] Assist level.  Patient's care partner unavailable to provide the necessary physical assistance at discharge and therefore pt to d/c to SNF in order to cont to increase independence with ADLs and reduce caregiver burden. Pt has made excellent progress while on IPR. She completes sit>stand and short distance ambulation with RW and min A. She has gained back functional return in B UEs, L>R. Pt with significant sensation impairments in B hands limiting functional use of hands when unable to see them and as a result requires increased assist with UB/LB dressing and toileting tasks, specifically based on fit of clothes and fatigue level.    Recommendation:  Patient will benefit from ongoing skilled OT services in skilled nursing facility setting to continue to advance functional skills in the area of BADL and Reduce care partner burden.  Equipment: To be determined at next venue of care  Reasons for discharge: treatment goals met and discharge from hospital  Patient/family agrees with progress made and goals achieved: Yes  OT Discharge Precautions/Restrictions  Precautions Precautions: Fall;Cervical Precaution Comments: Ok to doff brace in bed, when ambulating to bathroom, and while showering. C-collar applied in sitting position  Required Braces or Orthoses: Cervical Brace Cervical Brace: Hard collar Restrictions Weight Bearing Restrictions: No Vision Baseline Vision/History: Wears glasses Wears Glasses: At all times Vision Assessment?: No apparent visual deficits Perception  Perception: Within  Functional Limits Praxis Praxis: Intact Cognition Overall Cognitive Status: Within Functional Limits for tasks assessed Arousal/Alertness: Awake/alert Orientation Level: Oriented X4 Memory: Appears intact Awareness: Appears intact Problem Solving: Appears intact Safety/Judgment: Appears intact Sensation Sensation Light Touch: Impaired Detail Light Touch Impaired Details: Impaired RLE;Impaired LLE Proprioception: Appears Intact Stereognosis: Impaired Detail Stereognosis Impaired Details: Impaired RUE;Impaired LUE Additional Comments: Increased sesnation L>R Coordination Gross Motor Movements are Fluid and Coordinated: No Fine Motor Movements are Fluid and Coordinated: No Coordination and Movement Description: 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 Motor  Motor Motor: Other (comment)(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) Motor - Discharge Observations: 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  Trunk/Postural Assessment  Cervical Assessment Cervical Assessment: Exceptions to WFL(Cervical pre-cautions; cervical brace) Thoracic Assessment Thoracic Assessment: Exceptions to WFL(Kyphotic; Rounded shoulders) Lumbar Assessment Lumbar Assessment: Exceptions to WFL(Posterior pelvic tilt) Postural Control Postural Control: Deficits on evaluation Trunk Control: impaired, improved since eval Righting Reactions: impaired, improved since eval  Balance Balance Balance Assessed: Yes Static Sitting Balance Static Sitting - Balance Support: No upper extremity supported;Feet supported Static Sitting - Level of Assistance: 5: Stand by assistance Dynamic Sitting Balance Dynamic Sitting - Balance Support: No upper extremity supported;Feet supported;During functional activity Dynamic Sitting - Level of Assistance: 5: Stand by assistance Sitting balance - Comments: Sitting on TTB to complete bathing task Static Standing Balance Static Standing - Balance Support: During functional activity;No upper extremity  supported Static Standing - Level of Assistance: 5: Stand by assistance;4: Min assist Dynamic Standing Balance Dynamic Standing - Balance Support: During functional activity;No upper extremity supported Dynamic Standing - Level of Assistance: 4: Min assist Dynamic Standing - Comments: Standing to complete LB dressing/toileting tasks Extremity/Trunk Assessment RUE Assessment RUE Assessment: Exceptions to [facility] Active Range of Motion (AROM) Comments: Limited shoulder flexion 2/2 shoulder/cervical pain General  Strength Comments: 3/5 throughout RUE Body System: Neuro LUE Assessment LUE Assessment: Exceptions to [facility] Active Range of Motion (AROM) Comments: Limited shoulder flexion 2/2 shoulder/cervical pain General Strength Comments: 3/5 throughout   Denise Espinoza L 03/11/2019, 7:06 AM

## 2019-03-11 NOTE — Progress Notes (Signed)
[place] PHYSICAL MEDICINE & REHABILITATION PROGRESS NOTE   Subjective/Complaints: No new issues. bp's still low when sitting/standing.  60's yesterday. Pt notices symptoms briefly.   ROS: Patient denies fever, rash, sore throat, blurred vision, nausea, vomiting, diarrhea, cough, shortness of breath or chest pain,  headache, or mood change.   Objective:   No results found. Recent Labs    03/10/19 0942  WBC 5.1  HGB 11.1*  HCT 35.7*  PLT 229   Recent Labs    03/10/19 0942  NA 137  K 4.2  CL 101  CO2 25  GLUCOSE 100*  BUN 16  CREATININE 0.86  CALCIUM 9.2    Intake/Output Summary (Last 24 hours) at 03/11/2019 0906 Last data filed at 03/11/2019 0759 Gross per 24 hour  Intake 960 ml  Output -  Net 960 ml     Physical Exam: Vital Signs Blood pressure (!) 104/58, pulse 66, temperature 97.6 F (36.4 C), temperature source Oral, resp. rate 17, height 5' 2.99" (1.6 m), weight 62.5 kg, SpO2 97 %. Constitutional: No distress . Vital signs reviewed. HEENT: EOMI, oral membranes moist Neck: supple Cardiovascular: RRR without murmur. No JVD    Respiratory: CTA Bilaterally without wheezes or rales. Normal effort    GI: BS +, non-tender, non-distended  Musc: No edema or tenderness in extremities. Neurological: Alert Motor:  Bilateral lower extremities: Grossly 4/5 proximal to distal. LUE 4-/5, RUE 3/5. Decreased sensory below C5 ongoing Skin: Skin iswarmand dry.staples neck cdi Psych:pleasant as always   Assessment/Plan: 1. Functional deficits secondary to cervical myelopathy/central cord which require 3+ hours per day of interdisciplinary therapy in a comprehensive inpatient rehab setting.  Physiatrist is providing close team supervision and 24 hour management of active medical problems listed below.  Physiatrist and rehab team continue to assess barriers to discharge/monitor patient progress toward functional and medical goals  Care Tool:  Bathing    Body parts  bathed by patient: Right arm, Left arm, Chest, Abdomen, Front perineal area, Right upper leg, Left upper leg, Face, Right lower leg, Left lower leg   Body parts bathed by helper: Buttocks     Bathing assist Assist Level: Minimal Assistance - Patient > 75%     Upper Body Dressing/Undressing Upper body dressing   What is the patient wearing?: Orthosis, Button up shirt Orthosis activity level: Performed by helper  Upper body assist Assist Level: Moderate Assistance - Patient 50 - 74%    Lower Body Dressing/Undressing Lower body dressing      What is the patient wearing?: Pants, Incontinence brief     Lower body assist Assist for lower body dressing: Moderate Assistance - Patient 50 - 74%     Toileting Toileting    Toileting assist Assist for toileting: Minimal Assistance - Patient > 75%     Transfers Chair/bed transfer  Transfers assist     Chair/bed transfer assist level: Minimal Assistance - Patient > 75%     Locomotion Ambulation   Ambulation assist   Ambulation activity did not occur: Safety/medical concerns  Assist level: Minimal Assistance - Patient > 75% Assistive device: Walker-rolling Max distance: 40'   Walk 10 feet activity   Assist  Walk 10 feet activity did not occur: Safety/medical concerns  Assist level: Minimal Assistance - Patient > 75% Assistive device: Walker-rolling   Walk 50 feet activity   Assist Walk 50 feet with 2 turns activity did not occur: Safety/medical concerns  Assist level: Moderate Assistance - Patient - 50 - 74% Assistive  device: Walker-rolling    Walk 150 feet activity   Assist Walk 150 feet activity did not occur: Safety/medical concerns         Walk 10 feet on uneven surface  activity   Assist Walk 10 feet on uneven surfaces activity did not occur: Safety/medical concerns         Wheelchair     Assist Will patient use wheelchair at discharge?: Yes Type of Wheelchair: Manual    Wheelchair  assist level: Supervision/Verbal cueing Max wheelchair distance: 150'    Wheelchair 50 feet with 2 turns activity    Assist        Assist Level: Supervision/Verbal cueing   Wheelchair 150 feet activity     Assist     Assist Level: Supervision/Verbal cueing    Medical Problem List and Plan: 1.Functional and mobility deficitssecondary to C2 fracture and 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 Pt is s/p C1 laminectomy, C2 ORIF and Occiput to C4 PLIF Continue CIR, plan for SNF ?wed  -bilateral WHO's  2. Antithrombotics: -DVT/anticoagulation:Mechanical:Sequential compression devices, below kneeBilateral lower extremities. Dopplers negative -antiplatelet therapy: N/A 3. Pain Management:Oxycodone prn--premedicate prior to therapy. Gabapentin tid for neuropathy 4. Mood:Team to provide ego support. LCSW to follow for evaluation and support. -antipsychotic agents: N/A 5. Neuropsych: This patientiscapable of making decisions on herown behalf. 6. Skin/Wound care: Ordered air matress overlay for pressure relief. 7. Fluids/Electrolytes/Nutrition:encourage PO   8. H/o DJT:[id number] metoprolol daily  Controlled on 6/2 9. Neurogenic bowel:   Relatively controlled on 6/1 10. Neurogenic bladder:Has been incontinent for years.   -time toileting to help improve continence 11.  Acute lower UTI  -Bactrim completed 12. Orthostasis/hypotension  -loss of sympathetic tone  -has been eating and drinking well  -TEDS/Abd binder  -begin florinef trial  -needs metoprolol for rate control    13.  Acute blood loss anemia  Hemoglobin 11.1 6/1  LOS: 24 days A FACE TO FACE EVALUATION WAS PERFORMED  Ranelle Oyster 03/11/2019, 9:06 AM

## 2019-03-11 NOTE — Consult Note (Signed)
Neuropsychological Consultation   Patient:   Denise Espinoza   DOB:   September 26, 1930  MR Number:  [id number]  Location:  [facility] [address] CENTER A [address] [id number] [place] [address] Dept: [phone] Loc: [phone]           Date of Service:   03/11/2019  Start Time:   3 PM End Time:   4 PM  Provider/Observer:  Arley Phenix, Psy.D.       Clinical Neuropsychologist       Billing Code/Service: [phone]  Chief Complaint:    Denise Espinoza is an 85 year old female with a history of hypertension,anosmia, SVT, and several month history of BLE instability.  Patient was admitted via [facility] on 02/04/2019 with reports of fall 3 weeks prior with onset of RUE>LUE numbness and weakness.  She was found to have displaced odontoid fracture with severe cervical spinal stenosis C1/C2 and C3/C4.  She underwent laminectomy for decompression of spinal cord with ORIF C2 fracture and PLF occiput to C4 by Dr. Conchita Paris on 4/30.  Patient was making good progress but severe neck pain 5/2 with BLE instability.  CT showed C2 laminar screw intruding into posterior spinal canal and right lateral recess.   Taken to OR for removal but continued to have decline in strength and numbness.  Felt to be due to edema.  Initial anxiety and pain affecting ADLs and mobility.  Patient referred to the comprehensive inpatient rehabilitation unit due to functional decline.  Reason for Service:  The patient was referred for neuropsychological consultation to assess coping and adjustment issues and assess to see if anxiety was still a problematic issue that she was having to cope with.  Below with the HPI for the current admission.  XJD:[facility] Denise Espinoza is an 85 year old female with history of HTN, anosmia, SVT, few months history of BLE instability who was admitted via [facility] on 02/04/19 with reports of fall 3 weeks PTA with onset of RUE>LUE numbness and weakness. She was found  to have displaced odontoid fracture with severe cervical spinal stenosis C1/C2 and C3/C4. She underwent C1 laminectomy for decompression fo spinal cord with ORIF C2 fracture and PLF occiput to C4 by Dr. Conchita Paris on 02/06/19. She was making good progress but severe neck pain 5/2 with BLE instability. Follow up CT showed C2 laminar screw intruding into posterior spinal canal and right lateral recess. She was taken to OR for removal of C2 translaminar screw on 5/5 but continued to have decline in BUE strength and worsening of numbness.  Follow up MRI spine 5/6 showed removal of C2 screw with continue anterolisthesis of C1 on nonunited dens fragment with continued canal stenosis and flattening of cord with abnormal T2 cord signal and surrounding edema. Symptoms felt to be due to edema and she was started on medrol dose pack. Therapy ongoing but patient continues to be limited by cervical myelopathy with weakness BUE/BLE, decreased initiation, anxiety and painaffecting ADLs and mobility. CIR recommended due to functional decline.  Current Status:  The patient reports that her anxiety has significantly improved and that she is coping fairly well.  She is disappointed that she will be going to a skilled nursing facility following her discharge from the rehab unit tomorrow.  However, she understands that need and the fact that there really is no one at her home that can help her with what she will need initially.  Will continue rehab at [facility] in [place].  Behavioral Observation: Denise Espinoza  presents as a 85 y.o.-year-old Right Caucasian Female who appeared her stated age. her dress was Appropriate and she was Well Groomed and her manners were Appropriate to the situation.  her participation was indicative of Appropriate and Attentive behaviors.  There were any physical disabilities noted.  she displayed an appropriate level of cooperation and motivation.     Interactions:    Active Appropriate  and Attentive  Attention:   within normal limits and attention span and concentration were age appropriate  Memory:   within normal limits; recent and remote memory intact  Visuo-spatial:  within normal limits  Speech (Volume):  normal  Speech:   normal; normal  Thought Process:  Coherent and Relevant  Though Content:  WNL; not suicidal and not homicidal  Orientation:   person, place, time/date and situation  Judgment:   Good  Planning:   Good  Affect:    Anxious  Mood:    Anxious  Insight:   Good  Intelligence:   high  Medical History:   Past Medical History:  Diagnosis Date  . Allergic rhinitis   . Anosmia   . Arthritis   . Full dentures   . History of diverticulitis   . Insomnia   . Insomnia   . Senile osteoporosis   . SOB (shortness of breath)   . SVT (supraventricular tachycardia) (HCC)   . Varicose vein of leg    left  . Wears glasses    Psychiatric History:  Patient denies past psychiatric history but did have anxiety around recent cervical surgeries.    Family Med/Psych History:  Family History  Problem Relation Age of Onset  . Cancer Mother   . High blood pressure Father   . Emphysema Brother     Risk of Suicide/Violence: virtually non-existent   Impression/DX:  Denise Espinoza is an 85 year old female with a history of hypertension,anosmia, SVT, and several month history of BLE instability.  Patient was admitted via [facility] on 02/04/2019 with reports of fall 3 weeks prior with onset of RUE>LUE numbness and weakness.  She was found to have displaced odontoid fracture with severe cervical spinal stenosis C1/C2 and C3/C4.  She underwent laminectomy for decompression of spinal cord with ORIF C2 fracture and PLF occiput to C4 by Dr. Conchita Paris on 4/30.  Patient was making good progress but severe neck pain 5/2 with BLE instability.  CT showed C2 laminar screw intruding into posterior spinal canal and right lateral recess.   Taken to OR for removal but continued  to have decline in strength and numbness.  Felt to be due to edema.  Initial anxiety and pain affecting ADLs and mobility.  Patient referred to the comprehensive inpatient rehabilitation unit due to functional decline.  The patient reports that her anxiety has significantly improved and that she is coping fairly well.  She is disappointed that she will be going to a skilled nursing facility following her discharge from the rehab unit tomorrow.  However, she understands that need and the fact that there really is no one at her home that can help her with what she will need initially.  Will continue rehab at [facility] in [place].   Diagnosis:    Central cord syndrome, subsequent encounter [facility]) - Plan: Ambulatory referral to Physical Medicine Rehab  Fever - Plan: DG CHEST PORT 1 VIEW, DG CHEST PORT 1 VIEW         Electronically Signed   _______________________ Arley Phenix, Psy.D.

## 2019-03-11 NOTE — Patient Care Conference (Signed)
Inpatient RehabilitationTeam Conference and Plan of Care Update Date: 03/11/2019   Time: 2:05 PM    Patient Name: Denise Espinoza      Medical Record Number: [id number]  Date of Birth: [birth year] Sex: Female         Room/Bed: 4W04C/4W04C-01 Payor Info: Payor: MEDICARE / Plan: MEDICARE PART A AND B / Product Type: *No Product type* /    Admitting Diagnosis: SCI Team  Other TSC dysf, central cord syndrome ; 21-50days, 36 final rule  Admit Date/Time:  02/15/2019  4:54 PM Admission Comments: No comment available   Primary Diagnosis:  Central cord syndrome (HCC) Principal Problem: Central cord syndrome [facility]  Patient Active Problem List   Diagnosis Date Noted  . Acute lower UTI   . Acute blood loss anemia   . Orthostatic hypotension   . History of supraventricular tachycardia   . Central cord syndrome (HCC) 02/15/2019  . 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    Expected Discharge Date: Expected Discharge Date: 03/12/19(to SNF)  Team Members Present: Physician leading conference: Dr. Faith Rogue Social Worker Present: Amada Jupiter, LCSW Nurse Present: Greta Doom, RN PT Present: Other (comment)(Taylor Serita Grit, PT) OT Present: Amy Rounds, OT PPS Coordinator present : Fae Pippin     Current Status/Progress Goal Weekly Team Focus  Medical   improving motor skills, bowel and bladder continent for most part. florinef for bp  improve bp stability, stabilze for dc to next venue  bp, skin care, nutrition   Bowel/Bladder   Incontinent of bowel and bladder. daily bowel program  day time toileting, frequent checks, prevent breakdown  assess toileting needs qshift and PRN   Swallow/Nutrition/ Hydration             ADL's   Min A standing balance during ADLs; min A UB and LB dressing; min-mod A toileting; set-up self-feeding and grooming  Upgraded to min-mod A overall  ADL re-training, neuro re-ed, pt to d/c to SNF tomorrow   Mobility   min A bed mobility, min to  mod A stand pivot transfers, min A gait up to 97' with RW with R hand orthosis  upgraded to min A overall  BP management, gait training, NMR   Communication             Safety/Cognition/ Behavioral Observations            Pain   C/o pain to back and hands. on scheduled Oxycodone, PRN tramadol, and PRN narco.  pain less than 3  assess pain qshift and PRN   Skin   No skin issues  maintain skin intgrity  assess skin qshift and PRN      *See Care Plan and progress notes for long and short-term goals.     Barriers to Discharge  Current Status/Progress Possible Resolutions Date Resolved   Physician                    Nursing                  PT  Decreased caregiver support;Home environment access/layout  pt family cannot provide 24/7 assist up on d/c              OT                  SLP                SW  Discharge Planning/Teaching Needs:  Plan to d/c to SNF tomorrow.  NA   Team Discussion:  Progressing well;  BP issues - meds.  Good arm strength returning.  Min assist bed mobility and amb with min assist.  SNF bed available tomorrow.  Revisions to Treatment Plan:  NA    Continued Need for Acute Rehabilitation Level of Care: The patient requires daily medical management by a physician with specialized training in physical medicine and rehabilitation for the following conditions: Daily direction of a multidisciplinary physical rehabilitation program to ensure safe treatment while eliciting the highest outcome that is of practical value to the patient.: Yes Daily medical management of patient stability for increased activity during participation in an intensive rehabilitation regime.: Yes Daily analysis of laboratory values and/or radiology reports with any subsequent need for medication adjustment of medical intervention for : Neurological problems;Wound care problems   I attest that I was present, lead the team conference, and concur with the assessment and  plan of the team.   Amada Jupiter 03/11/2019, 3:23 PM    Team conference was held via web/ teleconference due to COVID - 19

## 2019-03-11 NOTE — Progress Notes (Signed)
Recreational Therapy Session Note  Patient Details  Name: Denise Espinoza MRN: [id number] Date of Birth: 06-22-1930 Today's Date: 03/11/2019 Time:  9-10 Pain: no c/o Skilled Therapeutic Interventions/Progress Updates: Session focused on activity tolerance, w/c mobility using BLE's, ambulation with RW, and discharge planning, specifically in regards to directing her care.  Pt expressing need to urinate when performing bed mobility and ace wrap application.  Pt performed stand pivot transfer to [facility] with min assist.  Pt stood for clothing management and assisted with pulling pants up with min assist for balance.  Pt propelled w/c to the dayroom using BLEs with supervision.  Pt ambulated 68' with RW and Min assist.  During seated rest breaks, LRT educated pt on directing her care giving specific examples of how she could do that at SNF level of care.  Pt stated understanding.   Therapy/Group: Co-Treatment  Alanys Godino 03/11/2019, 2:13 PM

## 2019-03-11 NOTE — Progress Notes (Signed)
Recreational Therapy Discharge Summary Patient Details  Name: Donna Snooks MRN: [id number] Date of Birth: 1930/06/11 Today's Date: 03/11/2019  Long term goals set: 1  Long term goals met: 1  Comments on progress toward goals: Pt has made good progress during LOS and is ready for discharge to SNF tomorrow for continued therapies and 24 hour care.  TR goal focused on pt directing her care and advocating for herself.  Education provided throughout LOS on this and heavily encouraged today in last treatment session.  Specific examples given.  Pt states understanding. Reasons for discharge: discharge from hospital  Follow-up: Encourage participation in activities program  Patient/family agrees with progress made and goals achieved: Yes  Mimie Goering 03/11/2019, 3:34 PM

## 2019-03-11 NOTE — Progress Notes (Signed)
Patients staples removed without any difficulty. Incision healing well with no drainage. Lorri Frederick, RN

## 2019-03-11 NOTE — Progress Notes (Signed)
Occupational Therapy Session Note  Patient Details  Name: Denise Espinoza MRN: [id number] Date of Birth: [birth year]  Today's Date: 03/11/2019 OT Individual Time: 3794-3276 [id number] OT Individual Time Calculation (min): 60 min and 75 min    Short Term Goals: Week 4:  OT Short Term Goal 1 (Week 4): STG=LTG due to LOS  Skilled Therapeutic Interventions/Progress Updates:    Session One: Pt seen for OT session focusing on functional mobility and ADL re-training. Pt sitting upright supported in bed upon arrival eating breakfast. She was able to use standard plastic spoon to self-feed cereal using R UE. She transferred to sitting EOB with supervision using hospital bed functions. Abdominal binder and cervical brace donned total A. Requested use of BSC as not sure she could hold void until getting to toilet. Min A stand pivot transfer to [facility] with HHA, total A for clothing management due to urgency and buttock hygiene for thoroughness following BM, pt able to complete pericare with steadying assist. CGA stand pivot transfer back to EOB using RW. She donned pants seated EOB with steadying assist, assist to advance pants entirely over hips.  Min HHA stand pivot transfer to w/c. Pt left seated in w/c at end of session, all needs in reach. Education provided throughout session regarding SCI and impacts on BP, reason for TED hose, decreased sensation and functional implications and d/c planning.   Session Two: Pt seen for OT ADL bathing/dressing session. Pt sitting up in recliner upon arrival, denying pain and agreeable to tx session. Throughout session, she completed stand pivot transfers with HHA or use of grab bars with min-occasional mod A to power into standing and steadying assist once standing. She bathed seated on TTB in shower. Initially used bath mit to assist with bathing, however, progressed to using standard washcloth and able to maintain functional grasp with each hand. Assist required for buttock  hygiene with pt completing lateral leans while therapist assist with hygiene.  Pt transitioned out of shower and dressed seated in w/c. Pt able to thread B UEs into shirt, however, requires assist to place over head as well as to wrap jacket around back. She was able to thread pants on from seated position and stood with steadying assist to pull pants up with assist to advance entirely over hips and over brief.  Pt set-up with lunch tray at end of session, set-up and all needs in reach.   Therapy Documentation Precautions:  Precautions Precautions: Fall, Cervical Precaution Comments: Ok to doff brace in bed, when ambulating to bathroom, and while showering. C-collar applied in sitting position  Required Braces or Orthoses: Cervical Brace Cervical Brace: Hard collar Restrictions Weight Bearing Restrictions: No   Therapy/Group: Individual Therapy  Akasha Melena L 03/11/2019, 6:56 AM

## 2019-03-11 NOTE — Progress Notes (Addendum)
Physical Therapy Discharge Summary  Patient Details  Name: Denise Espinoza MRN: [id number] Date of Birth: 1929/11/06  Today's Date: 03/11/2019 PT Individual Time: 0900-1015 PT Individual Time Calculation (min): 75 min    Patient has met 7 of 7 long term goals due to improved activity tolerance, improved balance, improved postural control, increased strength, increased range of motion, ability to compensate for deficits and improved coordination.  Patient to discharge at a wheelchair level [place].   Patient's care partner unavailable to provide the necessary physical assistance at discharge so patient will be d/c to a SNF to continue to work towards becoming independent with functional mobility in order to d/c home safely.  Reasons goals not met: Pt has met all rehab goals.  Recommendation:  Patient will benefit from ongoing skilled PT services in skilled nursing facility setting to continue to advance safe functional mobility, address ongoing impairments in strength, balance, coordination, safety, independence with functional mobility, and minimize fall risk.  Equipment: No equipment provided. Equipment to be provided by accepting facility.  Reasons for discharge: treatment goals met and discharge from hospital  Patient/family agrees with progress made and goals achieved: Yes   Skilled Intervention: Pt received seated in w/c in room, agreeable to PT session. No complaints of pain. Seated BP 103/60. Sit to stand with min A. Pt becomes very lightheaded and dizzy in standing, returned to bed and to supine to don BLE ACE wraps and tighten abdominal binder. Pt is min A for bed mobility. Pt reports urge to urinate. Stand pivot transfer bed to commode with min A. Pt is able to continently void on commode. Standing balance with min A while dependent pericare and clothing management performed. No further instances of dizziness in standing. Manual w/c propulsion x 150 ft with use of BLE and  Superviaion. Ambulation x 86 ft with RW with R hand orthosis and min A with close w/c follow for safety. Seated BP following gait: 133/75. Pt exhibits decreased B step length and ataxic gait pattern. Standing alt 4" step taps with RW and min A for balance. Pt left seated in recliner in room with needs in reach at end of session. Cotreatment session with RT with focus on pt advocating for herself at next facility and when she d/c home as well.   PT Discharge Precautions/Restrictions Precautions Precautions: Fall;Cervical Precaution Comments: Ok to doff brace in bed, when ambulating to bathroom, and while showering. C-collar applied in sitting position  Required Braces or Orthoses: Cervical Brace Cervical Brace: Hard collar Restrictions Weight Bearing Restrictions: No Pain Pain Assessment Pain Scale: 0-10 Pain Score: 0-No pain Vision/Perception  Perception Perception: Within Functional Limits Praxis Praxis: Intact  Cognition Overall Cognitive Status: Within Functional Limits for tasks assessed Arousal/Alertness: Awake/alert Orientation Level: Oriented X4 Attention: Sustained Sustained Attention: Appears intact Memory: Appears intact Awareness: Appears intact Problem Solving: Appears intact Safety/Judgment: Appears intact Sensation Sensation Light Touch: Impaired Detail Light Touch Impaired Details: Impaired RLE;Impaired LLE(distal>proximal) Proprioception: Appears Intact Coordination Gross Motor Movements are Fluid and Coordinated: No Fine Motor Movements are Fluid and Coordinated: No Coordination and Movement Description: 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 Motor  Motor Motor: Other (comment)(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) Motor - Discharge Observations: 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  Mobility Bed Mobility Bed Mobility: Rolling Right;Rolling Left;Supine to Sit;Sit to Supine Rolling Right: Minimal Assistance - Patient > 75% Rolling Left: Minimal Assistance - Patient >  75% Supine to Sit: Minimal Assistance - Patient > 75% Sit to Supine: Minimal Assistance - Patient > 75% Transfers Transfers: Sit  to Stand;Stand Pivot Transfers Sit to Stand: Minimal Assistance - Patient > 75% Stand Pivot Transfers: Minimal Assistance - Patient > 75% Stand Pivot Transfer Details: Verbal cues for technique;Verbal cues for precautions/safety;Manual facilitation for weight shifting Transfer (Assistive device): None Locomotion  Gait Ambulation: Yes Gait Assistance: Minimal Assistance - Patient > 75% Assistive device: Rolling walker(with R hand orthosis) Gait Assistance Details: Verbal cues for sequencing;Verbal cues for technique;Verbal cues for precautions/safety;Verbal cues for gait pattern;Verbal cues for safe use of DME/AE Gait Gait: Yes Gait Pattern: Impaired Gait Pattern: Decreased step length - right;Decreased step length - left;Decreased stride length;Ataxic Gait velocity: decreased Stairs / Additional Locomotion Stairs: No Wheelchair Mobility Wheelchair Mobility: Yes Wheelchair Assistance: Supervision/Verbal cueing Wheelchair Propulsion: Both lower extermities Wheelchair Parts Management: Needs assistance  Trunk/Postural Assessment  Cervical Assessment Cervical Assessment: Exceptions to WFL(forward head, cervical precautions) Thoracic Assessment Thoracic Assessment: Exceptions to WFL(kyphotic) Lumbar Assessment Lumbar Assessment: Exceptions to WFL(posterior pelvic tilt) Postural Control Postural Control: Deficits on evaluation Trunk Control: impaired, improved since eval Righting Reactions: impaired, improved since eval  Balance Balance Balance Assessed: Yes Static Sitting Balance Static Sitting - Balance Support: No upper extremity supported;Feet supported Static Sitting - Level of Assistance: 5: Stand by assistance Dynamic Sitting Balance Dynamic Sitting - Balance Support: No upper extremity supported;Feet supported;During functional  activity Dynamic Sitting - Level of Assistance: 4: Min assist Static Standing Balance Static Standing - Balance Support: Bilateral upper extremity supported;During functional activity Static Standing - Level of Assistance: 5: Stand by assistance Dynamic Standing Balance Dynamic Standing - Balance Support: Bilateral upper extremity supported Dynamic Standing - Level of Assistance: 4: Min assist Extremity Assessment   RLE Assessment RLE Assessment: Exceptions to WFL General Strength Comments: see below RLE Strength Right Hip Flexion: 4-/5 Right Knee Flexion: 4+/5 Right Knee Extension: 4+/5 Right Ankle Dorsiflexion: 4/5 LLE Assessment LLE Assessment: Exceptions to WFL General Strength Comments: see below LLE Strength Left Hip Flexion: 4-/5 Left Knee Flexion: 4+/5 Left Knee Extension: 4+/5 Left Ankle Dorsiflexion: 4/5      , PT, DPT 03/11/2019, 2:29 PM  

## 2019-03-12 ENCOUNTER — Inpatient Hospital Stay
Admission: RE | Admit: 2019-03-12 | Discharge: 2019-04-18 | Disposition: A | Discharge status: Home / Self Care | Payer: Medicare Other | Source: Ambulatory Visit | Attending: Internal Medicine | Admitting: Internal Medicine

## 2019-03-12 DIAGNOSIS — R531 Weakness: Principal | ICD-10-CM

## 2019-03-12 MED ORDER — OXYCODONE HCL 10 MG PO TABS: 10.0000 mg | ORAL_TABLET | Freq: Two times a day (BID) | ORAL | 0 refills | Status: DC

## 2019-03-12 MED ORDER — TRAMADOL HCL 50 MG PO TABS: 50.0000 mg | ORAL_TABLET | Freq: Four times a day (QID) | ORAL | 0 refills | Status: DC | PRN

## 2019-03-12 MED ORDER — WHITE PETROLATUM EX OINT
TOPICAL_OINTMENT | CUTANEOUS | Status: AC
  Filled 2019-03-12: qty 28.35

## 2019-03-12 MED ORDER — FLUDROCORTISONE ACETATE 0.1 MG PO TABS: 0.1000 mg | ORAL_TABLET | Freq: Every day | ORAL | Status: DC

## 2019-03-12 NOTE — Plan of Care (Signed)
  Problem: Education: Goal: Ability to verbalize activity precautions or restrictions will improve Description Patient will be able to verbalize safety precautions with minimal precautions.   Outcome: Completed/Met Goal: Knowledge of the prescribed therapeutic regimen will improve Outcome: Completed/Met Goal: Understanding of discharge needs will improve Outcome: Completed/Met   Problem: Activity: Goal: Ability to avoid complications of mobility impairment will improve Description Patient will remain free from falls.   Outcome: Completed/Met Goal: Ability to tolerate increased activity will improve Description Will improve tolerance to increased activity with min. assist  Outcome: Completed/Met Goal: Will remain free from falls Outcome: Completed/Met   Problem: Bowel/Gastric: Goal: Gastrointestinal status for postoperative course will improve Description Patient will have BM at minimum every other day.   Outcome: Completed/Met   Problem: Clinical Measurements: Goal: Ability to maintain clinical measurements within normal limits will improve Description Patient VS will remain within defined limits.   Outcome: Completed/Met Goal: Postoperative complications will be avoided or minimized Outcome: Completed/Met Goal: Diagnostic test results will improve Outcome: Completed/Met   Problem: Pain Management: Goal: Pain level will decrease Description Patient will maintain pain level of <3/10 with medications.   Outcome: Completed/Met   Problem: Skin Integrity: Goal: Will show signs of wound healing Description Patient will remain free from PI with moderate assist to turning  Outcome: Completed/Met   Problem: Health Behavior/Discharge Planning: Goal: Identification of resources available to assist in meeting health care needs will improve Description Patient will notify staff of need for assist with minimal assist.  Outcome: Completed/Met   Problem:  Bladder/Genitourinary: Goal: Urinary functional status for postoperative course will improve Description Patient will notify staff in time to utilize bedpan or BSC with minimal assist.   Outcome: Completed/Met   Problem: Consults Goal: RH SPINAL CORD INJURY PATIENT EDUCATION Description  See Patient Education module for education specifics.  Outcome: Completed/Met   Problem: SCI BOWEL ELIMINATION Goal: RH STG MANAGE BOWEL WITH ASSISTANCE Description STG Manage Bowel with max Assistance.  Outcome: Completed/Met Goal: RH STG SCI MANAGE BOWEL WITH MEDICATION WITH ASSISTANCE Description STG SCI Manage bowel with medication with mod assistance.  Outcome: Completed/Met Goal: RH STG SCI MANAGE BOWEL PROGRAM W/ASSIST OR AS APPROPRIATE Description STG SCI Manage bowel program w/total assist or as appropriate.  Outcome: Completed/Met   Problem: SCI BLADDER ELIMINATION Goal: RH STG MANAGE BLADDER WITH ASSISTANCE Description STG Manage Bladder With max Assistance  Outcome: Completed/Met Goal: RH STG SCI MANAGE BLADDER PROGRAM W/ASSISTANCE Description With max assistance  Outcome: Completed/Met   Problem: RH SKIN INTEGRITY Goal: RH STG SKIN FREE OF INFECTION/BREAKDOWN Outcome: Completed/Met Goal: RH STG MAINTAIN SKIN INTEGRITY WITH ASSISTANCE Description STG Maintain Skin Integrity With max Assistance.  Outcome: Completed/Met Goal: RH STG ABLE TO PERFORM INCISION/WOUND CARE W/ASSISTANCE Description STG Able To Perform Incision/Wound Care With mod Assistance.  Outcome: Completed/Met   Problem: RH SAFETY Goal: RH STG ADHERE TO SAFETY PRECAUTIONS W/ASSISTANCE/DEVICE Description STG Adhere to Safety Precautions With min  Assistance/Device.  Outcome: Completed/Met   Problem: RH PAIN MANAGEMENT Goal: RH STG PAIN MANAGED AT OR BELOW PT'S PAIN GOAL Description Rate pain < 3 on pain scale  Outcome: Completed/Met   Problem: RH KNOWLEDGE DEFICIT SCI Goal: RH STG INCREASE  KNOWLEDGE OF SELF CARE AFTER SCI Description Patient will verbalize self-care needs with minimal assist.   Outcome: Completed/Met

## 2019-03-12 NOTE — Progress Notes (Signed)
Patient discharged to [facility], report called to Nurse Bjorn Loser. Discharge packet and personal belongings sent with patient. Patient was transported by [facility] services.   Raylene Carmickle, Kae Heller, RN

## 2019-03-12 NOTE — Progress Notes (Signed)
Social Work  Discharge Note  The overall goal for the admission was met for:   Discharge location: Yes - d/c plan at time of admission was known to be SNF  Length of Stay: Yes - 25 days  Discharge activity level: Yes-  Met upgraded minimal assistance goals overall  Home/community participation: Yes  Services provided included: MD, RD, PT, OT, RN, TR, Pharmacy, Neuropsych and SW  Financial Services: Medicare and Private Insurance: [employer]  Follow-up services arranged: Other: SNF placement at [facility]  Comments (or additional information):        Contact info:  Daughter, Freddy Jaksch @ [phone]  Patient/Family verbalized understanding of follow-up arrangements: Yes  Individual responsible for coordination of the follow-up plan: pt  Confirmed correct DME delivered:  NA    Sharlena Kristensen

## 2019-03-12 NOTE — Discharge Instructions (Signed)
Inpatient Rehab Discharge Instructions  Denise Espinoza Discharge date and time: 03/12/19    Activities/Precautions/ Functional Status: Activity: no lifting, driving, or strenuous exercise Diet: regular diet Wound Care: keep wound clean and dry   Functional status:  ___ No restrictions     ___ Walk up steps independently _X__ 24/7 supervision/assistance   ___ Walk up steps with assistance ___ Intermittent supervision/assistance  ___ Bathe/dress independently ___ Walk with walker     _X__ Bathe/dress with assistance ___ Walk Independently    ___ Shower independently ___ Walk with assistance    ___ Shower with assistance ___ No alcohol     ___ Return to work/school ________   Special Instructions: 1. Wear collar at all times.   My questions have been answered and I understand these instructions. I will adhere to these goals and the provided educational materials after my discharge from the hospital.  Patient/Caregiver Signature _______________________________ Date __________  Clinician Signature _______________________________________ Date __________  Please bring this form and your medication list with you to all your follow-up doctor's appointments.

## 2019-03-12 NOTE — Progress Notes (Signed)
[place] PHYSICAL MEDICINE & REHABILITATION PROGRESS NOTE   Subjective/Complaints: Patient seen sitting up in bed this morning.  She states she slept well overnight.  She states she is ready for discharge.  She is very positive.  ROS: Denies CP, SOB, N/V/D  Objective:   No results found. Recent Labs    03/10/19 0942  WBC 5.1  HGB 11.1*  HCT 35.7*  PLT 229   Recent Labs    03/10/19 0942  NA 137  K 4.2  CL 101  CO2 25  GLUCOSE 100*  BUN 16  CREATININE 0.86  CALCIUM 9.2    Intake/Output Summary (Last 24 hours) at 03/12/2019 1324 Last data filed at 03/12/2019 0755 Gross per 24 hour  Intake 120 ml  Output -  Net 120 ml     Physical Exam: Vital Signs Blood pressure 115/63, pulse 72, temperature 98.1 F (36.7 C), resp. rate 17, height 5' 2.99" (1.6 m), weight 62.5 kg, SpO2 94 %. Constitutional: No distress . Vital signs reviewed. HENT: Normocephalic.  Atraumatic. Eyes: EOMI. No discharge. Cardiovascular: No JVD. Respiratory: Normal effort. GI: Non-distended. Musc: No edema or tenderness in extremities. Neurological: Alert Motor:  Bilateral lower extremities: Grossly 4/5 proximal to distal.  LUE grossly 4-/5  RUE grossly 3/5.  Skin: Skin iswarmand dry.staples neck cdi Psych:pleasant as always   Assessment/Plan: 1. Functional deficits secondary to cervical myelopathy/central cord which require 3+ hours per day of interdisciplinary therapy in a comprehensive inpatient rehab setting.  Physiatrist is providing close team supervision and 24 hour management of active medical problems listed below.  Physiatrist and rehab team continue to assess barriers to discharge/monitor patient progress toward functional and medical goals  Care Tool:  Bathing    Body parts bathed by patient: Right arm, Left arm, Chest, Abdomen, Front perineal area, Right upper leg, Left upper leg, Face, Right lower leg, Left lower leg   Body parts bathed by helper: Buttocks      Bathing assist Assist Level: Contact Guard/Touching assist     Upper Body Dressing/Undressing Upper body dressing   What is the patient wearing?: Orthosis, Button up shirt Orthosis activity level: Performed by helper  Upper body assist Assist Level: Moderate Assistance - Patient 50 - 74%    Lower Body Dressing/Undressing Lower body dressing      What is the patient wearing?: Pants, Incontinence brief     Lower body assist Assist for lower body dressing: Moderate Assistance - Patient 50 - 74%     Toileting Toileting    Toileting assist Assist for toileting: Maximal Assistance - Patient 25 - 49%     Transfers Chair/bed transfer  Transfers assist     Chair/bed transfer assist level: Minimal Assistance - Patient > 75%     Locomotion Ambulation   Ambulation assist   Ambulation activity did not occur: Safety/medical concerns  Assist level: Minimal Assistance - Patient > 75% Assistive device: Walker-rolling Max distance: 60'   Walk 10 feet activity   Assist  Walk 10 feet activity did not occur: Safety/medical concerns  Assist level: Minimal Assistance - Patient > 75% Assistive device: Walker-rolling   Walk 50 feet activity   Assist Walk 50 feet with 2 turns activity did not occur: Safety/medical concerns  Assist level: Minimal Assistance - Patient > 75% Assistive device: Walker-rolling    Walk 150 feet activity   Assist Walk 150 feet activity did not occur: Safety/medical concerns         Walk 10 feet on uneven  surface  activity   Assist Walk 10 feet on uneven surfaces activity did not occur: Safety/medical concerns         Wheelchair     Assist Will patient use wheelchair at discharge?: Yes Type of Wheelchair: Manual    Wheelchair assist level: Supervision/Verbal cueing Max wheelchair distance: 150'    Wheelchair 50 feet with 2 turns activity    Assist        Assist Level: Supervision/Verbal cueing   Wheelchair  150 feet activity     Assist     Assist Level: Supervision/Verbal cueing    Medical Problem List and Plan: 1.Functional and mobility deficitssecondary to C2 fracture and 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 Pt is s/p C1 laminectomy, C2 ORIF and Occiput to C4 PLIF Discharge to SNF  Plan for d/c tomorrow  Patient follow-up with MD for hospital follow-up in 1 month  -bilateral WHO's  2. Antithrombotics: -DVT/anticoagulation:Mechanical:Sequential compression devices, below kneeBilateral lower extremities. Dopplers negative -antiplatelet therapy: N/A 3. Pain Management:Oxycodone prn--premedicate prior to therapy. Gabapentin tid for neuropathy 4. Mood:Team to provide ego support. LCSW to follow for evaluation and support. -antipsychotic agents: N/A 5. Neuropsych: This patientiscapable of making decisions on herown behalf. 6. Skin/Wound care: Ordered air matress overlay for pressure relief. 7. Fluids/Electrolytes/Nutrition:encourage PO  8. H/o WUJ:[id number]:Continue metoprolol daily  Controlled on 6/3 9. Neurogenic bowel:   Relatively controlled on 6/1 10. Neurogenic bladder:Has been incontinent for years.   -time toileting to help improve continence 11.  Acute lower UTI  -Bactrim completed 12. Orthostasis/hypotension  Likely autonomic component  -has been eating and drinking well  -TEDS/Abd binder  -begin florinef trial 13.  Acute blood loss anemia  Hemoglobin 11.1 on 6/1  LOS: 25 days A FACE TO FACE EVALUATION WAS PERFORMED  Leeanne Butters Karis Jubanil Kesia Dalto 03/12/2019, 1:24 PM

## 2019-03-13 ENCOUNTER — Non-Acute Institutional Stay (SKILLED_NURSING_FACILITY): Payer: Medicare Other | Admitting: Internal Medicine

## 2019-03-13 ENCOUNTER — Encounter: Payer: Self-pay | Admitting: Internal Medicine

## 2019-03-13 DIAGNOSIS — Z981 Arthrodesis status: Secondary | ICD-10-CM

## 2019-03-13 DIAGNOSIS — Z8679 Personal history of other diseases of the circulatory system: Secondary | ICD-10-CM | POA: Diagnosis not present

## 2019-03-13 DIAGNOSIS — I951 Orthostatic hypotension: Secondary | ICD-10-CM | POA: Diagnosis not present

## 2019-03-13 DIAGNOSIS — Z9889 Other specified postprocedural states: Secondary | ICD-10-CM

## 2019-03-13 NOTE — Progress Notes (Signed)
Provider:  Einar Crow, MD Location:  [facility] Room Number: 61 W Place of Service:  SNF (31)  PCP: Tacy Learn, FNP Patient Care Team: Tacy Learn, FNP as PCP - General (Family Medicine) Daryel November, MD as Referring Physician (Internal Medicine)  Extended Emergency Contact Information Primary Emergency Contact: Avelina Laine [place] Phone: [phone] Relation: Niece Secondary Emergency Contact: blackstock,alexandria Mobile Phone: [phone] Relation: Granddaughter  Code Status: Full Code Goals of Care: Advanced Directive information Advanced Directives 03/13/2019  Does Patient Have a Medical Advance Directive? No  Would patient like information on creating a medical advance directive? No - Patient declined      Chief Complaint  Patient presents with  . New Admit To SNF    Admission    HPI: Patient is a 85 y.o. female seen today for admission to SNF for continue Therapy  Patient was in the hospital from 04/30-05/08 Cervicla Laminectomy Followe dby Inpatient rehab till 06/03  Patient has a history of hypertension, hyperlipidemia, osteoporosis, insomnia Patient states that she has been having lower extremity numbness and weakness for past few months.  It led to gait instability and she was using walker to walk.  She had made an appointment with neurosurgery.  But on day of admission her legs just gave out and she fell.  And she also noticed that her upper extremities were numb and weak and she could not hold onto the support anymore.  In ED she was found to have bilateral upper and lower extremity weakness due to severe cervical stenosis at C1-2 and C3-4. With Odontoid Fracture.  She was seen by neurosurgery. And underwent laminectomy with decompression of spinal cord on 04/30 and underwent Screw removal  on 05/05 for Malposition of Hardware She stayed stable after that and was discharged to inpatient Rehab  on 05/09 In rehab patient had episodes of orthostatic hypotension.  It was decided to use abdominal binder and TED hoses but patient still would drop her blood pressure to 60s.  She was started on Florinef. She also had fever and was treated for UTI. Patient made improvement but was not ready for discharge after 3 weeks and her family was not able to take care of her at home.  She is here to continue her rehab. Patient did not have any acute problems.  She did complain of some insomnia and was wondering if she can restart her trazodone.  Denies any cough chest pain or fever.  Her pain is controlled Patient was very active before the surgery.  She says her son lives with her.  She was driving.      Past Medical History:  Diagnosis Date  . Allergic rhinitis   . Anosmia   . Arthritis   . Full dentures   . History of diverticulitis   . Insomnia   . Insomnia   . Senile osteoporosis   . SOB (shortness of breath)   . SVT (supraventricular tachycardia) (HCC)   . Varicose vein of leg    left  . Wears glasses    Past Surgical History:  Procedure Laterality Date  . COLON RESECTION  1989   diverticulitis  . COLONOSCOPY    . ESOPHAGEAL DILATION  2015   endoscopy  . POSTERIOR CERVICAL FUSION/FORAMINOTOMY N/A 02/11/2019   Procedure: POSTERIOR CERVICAL INSTRUMENTATION REMOVAL CERVICAL TWO;  Surgeon: Lisbeth Renshaw, MD;  Location: [facility] OR;  Service: Neurosurgery;  Laterality: N/A;  . POSTERIOR CERVICAL LAMINECTOMY N/A 02/06/2019  Procedure: Cervical one LAMINECTOMY, Occiput-Cervical four fusion;  Surgeon: Lisbeth Renshaw, MD;  Location: [facility] OR;  Service: Neurosurgery;  Laterality: N/A;    reports that she has never smoked. She has never used smokeless tobacco. She reports current alcohol use. She reports that she does not use drugs. Social History   Socioeconomic History  . Marital status: Widowed    Spouse name: Not on file  . Number of children: Not on file  . Years of education: Not  on file  . Highest education level: Not on file  Occupational History  . Not on file  Social Needs  . Financial resource strain: Not on file  . Food insecurity:    Worry: Not on file    Inability: Not on file  . Transportation needs:    Medical: Not on file    Non-medical: Not on file  Tobacco Use  . Smoking status: Never Smoker  . Smokeless tobacco: Never Used  Substance and Sexual Activity  . Alcohol use: Yes    Comment: rare  . Drug use: No  . Sexual activity: Not on file  Lifestyle  . Physical activity:    Days per week: Not on file    Minutes per session: Not on file  . Stress: Not on file  Relationships  . Social connections:    Talks on phone: Not on file    Gets together: Not on file    Attends religious service: Not on file    Active member of club or organization: Not on file    Attends meetings of clubs or organizations: Not on file    Relationship status: Not on file  . Intimate partner violence:    Fear of current or ex partner: Not on file    Emotionally abused: Not on file    Physically abused: Not on file    Forced sexual activity: Not on file  Other Topics Concern  . Not on file  Social History Narrative  . Not on file    Functional Status Survey:    Family History  Problem Relation Age of Onset  . Cancer Mother   . High blood pressure Father   . Emphysema Brother     There are no preventive care reminders to display for this patient.  Allergies  Allergen Reactions  . Penicillins Hives, Swelling, Rash and Other (See Comments)    Did it involve swelling of the face/tongue/throat, SOB, or low BP? #  #  #  YES  #  #  #  Did it involve sudden or severe rash/hives, skin peeling, or any reaction on the inside of your mouth or nose? No Did you need to seek medical attention at a hospital or doctor's office? #  #  #  YES  #  #  #  When did it last happen?85 years old.   . Codeine Phosphate [Codeine] Other (See Comments)    headache     Outpatient Encounter Medications as of 03/13/2019  Medication Sig  . acetaminophen (TYLENOL) 325 MG tablet Take 1-2 tablets (325-650 mg total) by mouth every 4 (four) hours as needed for mild pain.  . bisacodyl (DULCOLAX) 10 MG suppository Place 1 suppository (10 mg total) rectally daily at 6 (six) AM.  . calcium-vitamin D (OSCAL WITH D) 500-200 MG-UNIT per tablet Take 1 tablet by mouth 2 (two) times daily.   . diclofenac sodium (VOLTAREN) 1 % GEL Apply 2 g topically 4 (four) times daily.  . fludrocortisone (  FLORINEF) 0.1 MG tablet Take 1 tablet (0.1 mg total) by mouth daily.  Marland Kitchen. gabapentin (NEURONTIN) 300 MG capsule Take 1 capsule (300 mg total) by mouth 3 (three) times daily.  . metoprolol succinate (TOPROL-XL) 25 MG 24 hr tablet Take 12.5 mg by mouth daily.  . NON FORMULARY Diet Type: Regular  . oxyCODONE (OXY IR/ROXICODONE) 5 MG immediate release tablet Take 1-2 tablets (5-10 mg total) by mouth every 3 (three) hours as needed for severe pain.  . Oxycodone HCl 10 MG TABS Take 10 mg by mouth daily. Give every morning prior to therapy  . pantoprazole (PROTONIX) 40 MG tablet Take 1 tablet (40 mg total) by mouth 2 (two) times daily.  . rosuvastatin (CRESTOR) 5 MG tablet Take 5 mg by mouth daily.  Marland Kitchen. senna-docusate (SENOKOT-S) 8.6-50 MG tablet Take 2 tablets by mouth 2 (two) times daily.  . traMADol (ULTRAM) 50 MG tablet Take 1 tablet (50 mg total) by mouth every 6 (six) hours as needed for moderate pain.  . [DISCONTINUED] Oxycodone HCl 10 MG TABS Take 1 tablet (10 mg total) by mouth 2 (two) times daily with breakfast and lunch. (Patient not taking: Reported on 03/13/2019)   No facility-administered encounter medications on file as of 03/13/2019.     Review of Systems  Constitutional: Negative.   HENT: Negative.   Respiratory: Negative.   Cardiovascular: Negative.   Gastrointestinal: Negative.   Genitourinary: Negative.   Musculoskeletal: Negative.   Skin: Negative.   Neurological: Negative.    Psychiatric/Behavioral: Positive for sleep disturbance.    Vitals:   03/13/19 0845  BP: (!) 98/58  Pulse: 60  Resp: 20  Temp: 98.9 F (37.2 C)  Weight: 130 lb 3.2 oz (59.1 kg)  Height: 5\' 3"  (1.6 m)   Body mass index is 23.06 kg/m. Physical Exam Vitals signs reviewed.  HENT:     Head: Normocephalic.     Nose: Nose normal.     Mouth/Throat:     Mouth: Mucous membranes are moist.     Pharynx: Oropharynx is clear.  Eyes:     Pupils: Pupils are equal, round, and reactive to light.  Neck:     Musculoskeletal: Neck supple.  Cardiovascular:     Rate and Rhythm: Normal rate and regular rhythm.     Pulses: Normal pulses.     Heart sounds: Normal heart sounds.  Pulmonary:     Effort: Pulmonary effort is normal. No respiratory distress.     Breath sounds: Normal breath sounds. No wheezing or rales.  Abdominal:     General: Abdomen is flat. Bowel sounds are normal.     Palpations: Abdomen is soft.  Musculoskeletal:        General: No swelling.  Skin:    General: Skin is warm and dry.  Neurological:     Mental Status: She is alert and oriented to person, place, and time.     Comments: Has 4-5  Strength in Bilateral LE 4/5 strength bilateral UE. Does have weakness in hands  Psychiatric:        Mood and Affect: Mood normal.        Thought Content: Thought content normal.        Judgment: Judgment normal.     Labs reviewed: Basic Metabolic Panel: Recent Labs    03/03/19 0557 03/05/19 1815 03/10/19 0942  NA 137 137 137  K 3.7 4.7 4.2  CL 99 101 101  CO2 27 26 25   GLUCOSE 98 108* 100*  BUN  15 19 16   CREATININE 0.59 0.79 0.86  CALCIUM 9.1 8.9 9.2   Liver Function Tests: Recent Labs    02/16/19 1657  AST 26  ALT 41  ALKPHOS 85  BILITOT 0.5  PROT 5.8*  ALBUMIN 2.7*   No results for input(s): LIPASE, AMYLASE in the last 8760 hours. No results for input(s): AMMONIA in the last 8760 hours. CBC: Recent Labs    02/04/19 1554  02/16/19 1657 02/23/19 0725  03/03/19 0557 03/10/19 0942  WBC 5.9   < > 8.2 7.7 5.1 5.1  NEUTROABS 4.3  --  6.1  --   --  3.5  HGB 12.3   < > 11.1* 10.5* 10.1* 11.1*  HCT 38.2   < > 33.4* 33.5* 30.9* 35.7*  MCV 94.1   < > 91.0 95.7 92.8 93.7  PLT 196   < > 267 220 184 229   < > = values in this interval not displayed.   Cardiac Enzymes: No results for input(s): CKTOTAL, CKMB, CKMBINDEX, TROPONINI in the last 8760 hours. BNP: Invalid input(s): POCBNP No results found for: HGBA1C No results found for: TSH No results found for: VITAMINB12 No results found for: FOLATE No results found for: IRON, TIBC, FERRITIN  Imaging and Procedures obtained prior to SNF admission: No results found.  Assessment/Plan S/P cervical spinal fusion and Laminectomy for Cervical Cord Compression Patient doing well Pain is controlled Working with therapy . She is able to do transfers with Mild assist Walking with Assist Orthostatic hypotension On Florinef Continue to monitor here History of supraventricular tachycardia On Toprol XL Hyperlipidemia On Crestor Neuropathy Continue Neurontin for Now    Family/ staff Communication:   Labs/tests ordered: BMP and CBC in 2 weeks Total time spent in this patient care encounter was  45_  minutes; greater than 50% of the visit spent counseling patient and staff, reviewing records , Labs and coordinating care for problems addressed at this encounter.

## 2019-03-14 ENCOUNTER — Encounter: Payer: Self-pay | Admitting: Adult Health

## 2019-03-14 ENCOUNTER — Non-Acute Institutional Stay (SKILLED_NURSING_FACILITY): Payer: Medicare Other | Admitting: Adult Health

## 2019-03-14 DIAGNOSIS — G992 Myelopathy in diseases classified elsewhere: Secondary | ICD-10-CM | POA: Diagnosis not present

## 2019-03-14 DIAGNOSIS — I951 Orthostatic hypotension: Secondary | ICD-10-CM | POA: Diagnosis not present

## 2019-03-14 DIAGNOSIS — S14129S Central cord syndrome at unspecified level of cervical spinal cord, sequela: Secondary | ICD-10-CM | POA: Diagnosis not present

## 2019-03-14 DIAGNOSIS — M4802 Spinal stenosis, cervical region: Secondary | ICD-10-CM

## 2019-03-14 NOTE — Progress Notes (Signed)
[facility]   [facility] Room Number: [address] Place of Service:  SNF (31)   CODE STATUS: DNR  Allergies  Allergen Reactions   Penicillins Hives, Swelling, Rash and Other (See Comments)    Did it involve swelling of the face/tongue/throat, SOB, or low BP? #  #  #  YES  #  #  #  Did it involve sudden or severe rash/hives, skin peeling, or any reaction on the inside of your mouth or nose? No Did you need to seek medical attention at a hospital or doctor's office? #  #  #  YES  #  #  #  When did it last happen?[age] years old.    Codeine Phosphate [Codeine] Other (See Comments)    headache    Chief Complaint  Patient presents with   Acute Visit    72 Hour Care Plan    HPI:  We have come together for her 72 hour care plan meeting. Her goal is to return back back home. She requires use of bilateral lower extremity ace wraps and an abdominal binder to maintain her hemostasis. She lives with her son and his family. She is ambulating in therapy. She has 2 steps inside her home which she does not need to use. She has a walker; will need a wheelchair and bsc. She denies any uncontrolled pain; she has numbness in both hands; no insomnia. There are no reports of fevers present.   Past Medical History:  Diagnosis Date   Allergic rhinitis    Anosmia    Arthritis    Full dentures    History of diverticulitis    Insomnia    Insomnia    Senile osteoporosis    SOB (shortness of breath)    SVT (supraventricular tachycardia) (HCC)    Varicose vein of leg    left   Wears glasses     Past Surgical History:  Procedure Laterality Date   COLON RESECTION  1989   diverticulitis   COLONOSCOPY     ESOPHAGEAL DILATION  2015   endoscopy   POSTERIOR CERVICAL FUSION/FORAMINOTOMY N/A 02/11/2019   Procedure: POSTERIOR CERVICAL INSTRUMENTATION REMOVAL CERVICAL TWO;  Surgeon: Lisbeth RenshawNundkumar, Neelesh, MD;  Location: [facility] OR;  Service: Neurosurgery;  Laterality:  N/A;   POSTERIOR CERVICAL LAMINECTOMY N/A 02/06/2019   Procedure: Cervical one LAMINECTOMY, Occiput-Cervical four fusion;  Surgeon: Lisbeth RenshawNundkumar, Neelesh, MD;  Location: [facility] OR;  Service: Neurosurgery;  Laterality: N/A;    Social History   Socioeconomic History   Marital status: Widowed    Spouse name: Not on file   Number of children: Not on file   Years of education: Not on file   Highest education level: Not on file  Occupational History   Not on file  Social Needs   Financial resource strain: Not on file   Food insecurity:    Worry: Not on file    Inability: Not on file   Transportation needs:    Medical: Not on file    Non-medical: Not on file  Tobacco Use   Smoking status: Never Smoker   Smokeless tobacco: Never Used  Substance and Sexual Activity   Alcohol use: Yes    Comment: rare   Drug use: No   Sexual activity: Not on file  Lifestyle   Physical activity:    Days per week: Not on file    Minutes per session: Not on file   Stress: Not on file  Relationships  Social connections:    Talks on phone: Not on file    Gets together: Not on file    Attends religious service: Not on file    Active member of club or organization: Not on file    Attends meetings of clubs or organizations: Not on file    Relationship status: Not on file   Intimate partner violence:    Fear of current or ex partner: Not on file    Emotionally abused: Not on file    Physically abused: Not on file    Forced sexual activity: Not on file  Other Topics Concern   Not on file  Social History Narrative   Not on file   Family History  Problem Relation Age of Onset   Cancer Mother    High blood pressure Father    Emphysema Brother       VITAL SIGNS BP 110/64    Pulse 68    Temp 98.6 F (37 C)    Resp 20    Ht 5\' 3"  (1.6 m)    Wt 131 lb (59.4 kg)    BMI 23.21 kg/m   Outpatient Encounter Medications as of 03/14/2019  Medication Sig   acetaminophen (TYLENOL) 325  MG tablet Take 1-2 tablets (325-650 mg total) by mouth every 4 (four) hours as needed for mild pain.   calcium-vitamin D (OSCAL WITH D) 500-200 MG-UNIT per tablet Take 1 tablet by mouth 2 (two) times daily.    diclofenac sodium (VOLTAREN) 1 % GEL Apply 2 g topically 4 (four) times daily.   fludrocortisone (FLORINEF) 0.1 MG tablet Take 1 tablet (0.1 mg total) by mouth daily.   gabapentin (NEURONTIN) 300 MG capsule Take 1 capsule (300 mg total) by mouth 3 (three) times daily.   metoprolol succinate (TOPROL-XL) 25 MG 24 hr tablet Take 12.5 mg by mouth daily.   NON FORMULARY Diet Type: Regular   oxyCODONE (OXY IR/ROXICODONE) 5 MG immediate release tablet Take 5 mg by mouth every 3 (three) hours as needed for severe pain. For severe breakthrough pain rated 8 to 10 x 7 days   Oxycodone HCl 10 MG TABS Take 10 mg by mouth daily. Give every morning prior to therapy   pantoprazole (PROTONIX) 40 MG tablet Take 1 tablet (40 mg total) by mouth 2 (two) times daily.   polyethylene glycol (MIRALAX / GLYCOLAX) 17 g packet Take 17 g by mouth daily.   rosuvastatin (CRESTOR) 5 MG tablet Take 5 mg by mouth daily.   senna-docusate (SENOKOT-S) 8.6-50 MG tablet Take 2 tablets by mouth 2 (two) times daily.   traMADol (ULTRAM) 50 MG tablet Take 1 tablet (50 mg total) by mouth every 6 (six) hours as needed for moderate pain.   traZODone (DESYREL) 50 MG tablet Take 25 mg by mouth at bedtime.   No facility-administered encounter medications on file as of 03/14/2019.      SIGNIFICANT DIAGNOSTIC EXAMS  TODAY;  02-10-19: ct of cervical spine:  1. Status post posterior occipitocervical fusion to the level of C3-C4, there is improved alignment of a chronic fracture of the dens, with approximately 5 mm anterolisthesis of C1 on C2. 2. Left approach right-sided C2 laminar screw intrudes into the posterior spinal canal and right lateral recess (series 8, image 32). Correlate for referable radiculopathy symptoms. 3.  Moderate multilevel disc degenerative disease of the cervical spine from C3 through C7, with ankylosis of C3-C4.  02-12-19: MRI: of cervical spine:  Apparent interval removal the right laminar  screw at C2 which appeared to intrude upon the canal. Continued 5 mm anterolisthesis of C1 and the nonunited dens fragment relative to the body of C2. Continued canal stenosis with AP diameter of the canal as narrow as 3.4 mm, flattening of the cord and abnormal T2 cord signal. In this patient has had interval laminectomy at C1, it is difficult to explain how the AP diameter of the canal actually measures smaller than on the pre-surgical exam. This could relate to postoperative edema the posterior tissues. Lesser but potentially significant stenosis from C2-3 through C7-T1 as outlined above.  02-17-19: bilateral lower extremity doppler: Right: There is no evidence of deep vein thrombosis in the lower extremity. A cystic structure is found in the popliteal fossa. Left: There is no evidence of deep vein thrombosis in the lower extremity. No cystic structure found in the popliteal fossa.  LABS REVIEWED TODAY:   02-07-19: wbc 10.9; hgb 11.6; hct 34.8; mcv 90.9; plt 172 glucose 149; bun 17; creat 0.68; k+ 4.3; na++ 135; ca 8.8  02-16-19: wbc 8.2; hgb 11.1; hct 33.4; mcv 91.0 plt 267 glucose 215; bun 14; creat 0.54; k+ 3.6; na++ 134; ca 8.6 liver normal albumin 2.7 urine culture: no growth  03-03-19: wbc 5.1; hgb 10.1; hct 30.9; mcv 92.8; plt 184; glucose 98; bun 15; create 0.59; k+ 3.7; na++ 137; ca 9.1  03-10-19: wbc 5.1; hgb 11.1; hct 35.7; mcv 93.7;plt 229; glucose 100; bun 16; creat 0.86; k+ 4.2; na++ 137; ca 9.2     Review of Systems  Constitutional: Negative for malaise/fatigue.  Respiratory: Negative for cough and shortness of breath.   Cardiovascular: Negative for chest pain, palpitations and leg swelling.  Gastrointestinal: Negative for abdominal pain, constipation and heartburn.  Musculoskeletal: Negative  for back pain, joint pain and myalgias.  Skin: Negative.   Neurological: Negative for dizziness.       Has numbness in both hands   Psychiatric/Behavioral: The patient is not nervous/anxious.     Physical Exam Constitutional:      General: She is not in acute distress.    Appearance: She is well-developed. She is not diaphoretic.     Comments: thin  Neck:     Musculoskeletal: Neck supple.     Thyroid: No thyromegaly.  Cardiovascular:     Rate and Rhythm: Normal rate and regular rhythm.     Pulses: Normal pulses.     Heart sounds: Normal heart sounds.  Pulmonary:     Effort: Pulmonary effort is normal. No respiratory distress.     Breath sounds: Normal breath sounds.  Abdominal:     General: Bowel sounds are normal. There is no distension.     Palpations: Abdomen is soft.     Tenderness: There is no abdominal tenderness.  Musculoskeletal:     Right lower leg: No edema.     Left lower leg: No edema.     Comments: Is able to move all extremities Has c-collar in place   Lymphadenopathy:     Cervical: No cervical adenopathy.  Skin:    General: Skin is warm and dry.  Neurological:     Mental Status: She is alert and oriented to person, place, and time.  Psychiatric:        Mood and Affect: Mood normal.     ASSESSMENT/ PLAN:  TODAY:   1. Stenosis of cervical spine with myelopathy 2. Central cord syndrome sequela 3. Orthostatic hypotension   She will need a wheelchair and bsc Will continue  therapy as directed Will continue current medication regimen Her goal remains to return home.            MD is aware of resident's narcotic use and is in agreement with current plan of care. We will attempt to wean resident as apropriate   Synthia Innocenteborah Lourdes Manning NP [facility]  Contact [phone] Monday through Friday 8am- 5pm  After hours call [phone]

## 2019-03-18 ENCOUNTER — Non-Acute Institutional Stay (SKILLED_NURSING_FACILITY): Payer: Medicare Other | Admitting: Adult Health

## 2019-03-18 ENCOUNTER — Encounter: Payer: Self-pay | Admitting: Adult Health

## 2019-03-18 DIAGNOSIS — G992 Myelopathy in diseases classified elsewhere: Secondary | ICD-10-CM

## 2019-03-18 DIAGNOSIS — S14129S Central cord syndrome at unspecified level of cervical spinal cord, sequela: Secondary | ICD-10-CM

## 2019-03-18 DIAGNOSIS — M4802 Spinal stenosis, cervical region: Secondary | ICD-10-CM | POA: Diagnosis not present

## 2019-03-18 NOTE — Progress Notes (Signed)
Location:   [facility] Room Number: [address] Place of Service:  SNF (31)   CODE STATUS: DNR  Allergies  Allergen Reactions  . Penicillins Hives, Swelling, Rash and Other (See Comments)    Did it involve swelling of the face/tongue/throat, SOB, or low BP? #  #  #  YES  #  #  #  Did it involve sudden or severe rash/hives, skin peeling, or any reaction on the inside of your mouth or nose? No Did you need to seek medical attention at a hospital or doctor's office? #  #  #  YES  #  #  #  When did it last happen?85 years old.   . Codeine Phosphate [Codeine] Other (See Comments)    headache    Chief Complaint  Patient presents with  . Acute Visit    Pain Management    HPI:  She is status post cervical laminectomy. She presently has oxycodone prior to therapy daily and prn oxycodone and ultram. She denies any uncontrolled pain. She has not been using her prn medications. She does have bilateral hand numbness present. There are no reports of fevers present.   Past Medical History:  Diagnosis Date  . Allergic rhinitis   . Anosmia   . Arthritis   . Full dentures   . History of diverticulitis   . Insomnia   . Insomnia   . Senile osteoporosis   . SOB (shortness of breath)   . SVT (supraventricular tachycardia) (HCC)   . Varicose vein of leg    left  . Wears glasses     Past Surgical History:  Procedure Laterality Date  . COLON RESECTION  1989   diverticulitis  . COLONOSCOPY    . ESOPHAGEAL DILATION  2015   endoscopy  . POSTERIOR CERVICAL FUSION/FORAMINOTOMY N/A 02/11/2019   Procedure: POSTERIOR CERVICAL INSTRUMENTATION REMOVAL CERVICAL TWO;  Surgeon: Lisbeth Renshaw, MD;  Location: [facility] OR;  Service: Neurosurgery;  Laterality: N/A;  . POSTERIOR CERVICAL LAMINECTOMY N/A 02/06/2019   Procedure: Cervical one LAMINECTOMY, Occiput-Cervical four fusion;  Surgeon: Lisbeth Renshaw, MD;  Location: [facility] OR;  Service: Neurosurgery;  Laterality: N/A;     Social History   Socioeconomic History  . Marital status: Widowed    Spouse name: Not on file  . Number of children: Not on file  . Years of education: Not on file  . Highest education level: Not on file  Occupational History  . Not on file  Social Needs  . Financial resource strain: Not on file  . Food insecurity:    Worry: Not on file    Inability: Not on file  . Transportation needs:    Medical: Not on file    Non-medical: Not on file  Tobacco Use  . Smoking status: Never Smoker  . Smokeless tobacco: Never Used  Substance and Sexual Activity  . Alcohol use: Yes    Comment: rare  . Drug use: No  . Sexual activity: Not on file  Lifestyle  . Physical activity:    Days per week: Not on file    Minutes per session: Not on file  . Stress: Not on file  Relationships  . Social connections:    Talks on phone: Not on file    Gets together: Not on file    Attends religious service: Not on file    Active member of club or organization: Not on file    Attends meetings of clubs or organizations:  Not on file    Relationship status: Not on file  . Intimate partner violence:    Fear of current or ex partner: Not on file    Emotionally abused: Not on file    Physically abused: Not on file    Forced sexual activity: Not on file  Other Topics Concern  . Not on file  Social History Narrative  . Not on file   Family History  Problem Relation Age of Onset  . Cancer Mother   . High blood pressure Father   . Emphysema Brother       VITAL SIGNS BP (!) 102/58   Pulse 60   Temp 98 F (36.7 C)   Resp 20   Ht 5\' 3"  (1.6 m)   Wt 132 lb (59.9 kg)   BMI 23.38 kg/m   Outpatient Encounter Medications as of 03/18/2019  Medication Sig  . acetaminophen (TYLENOL) 325 MG tablet Take 1-2 tablets (325-650 mg total) by mouth every 4 (four) hours as needed for mild pain.  . calcium-vitamin D (OSCAL WITH D) 500-200 MG-UNIT per tablet Take 1 tablet by mouth 2 (two) times daily.   .  diclofenac sodium (VOLTAREN) 1 % GEL Apply 2 g topically 4 (four) times daily.  . fludrocortisone (FLORINEF) 0.1 MG tablet Take 1 tablet (0.1 mg total) by mouth daily.  05/18/2019 gabapentin (NEURONTIN) 300 MG capsule Take 1 capsule (300 mg total) by mouth 3 (three) times daily.  . metoprolol succinate (TOPROL-XL) 25 MG 24 hr tablet Take 12.5 mg by mouth daily.  . NON FORMULARY Diet Type: Regular  . Nutritional Supplements (ENSURE ENLIVE PO) Take 1 Bottle by mouth daily.  Marland Kitchen oxyCODONE (OXY IR/ROXICODONE) 5 MG immediate release tablet Take 5 mg by mouth every 3 (three) hours as needed for severe pain. For severe breakthrough pain rated 8 to 10 x 7 days  . Oxycodone HCl 10 MG TABS Take 10 mg by mouth daily. Give every morning prior to therapy  . pantoprazole (PROTONIX) 40 MG tablet Take 1 tablet (40 mg total) by mouth 2 (two) times daily.  . polyethylene glycol (MIRALAX / GLYCOLAX) 17 g packet Take 17 g by mouth daily.  . rosuvastatin (CRESTOR) 5 MG tablet Take 5 mg by mouth daily.  Marland Kitchen senna-docusate (SENOKOT-S) 8.6-50 MG tablet Take 2 tablets by mouth 2 (two) times daily.  . traMADol (ULTRAM) 50 MG tablet Take 1 tablet (50 mg total) by mouth every 6 (six) hours as needed for moderate pain.  . traZODone (DESYREL) 50 MG tablet Take 25 mg by mouth at bedtime.   No facility-administered encounter medications on file as of 03/18/2019.      SIGNIFICANT DIAGNOSTIC EXAMS  PREVIOUS;  02-10-19: ct of cervical spine:  1. Status post posterior occipitocervical fusion to the level of C3-C4, there is improved alignment of a chronic fracture of the dens, with approximately 5 mm anterolisthesis of C1 on C2. 2. Left approach right-sided C2 laminar screw intrudes into the posterior spinal canal and right lateral recess (series 8, image 32). Correlate for referable radiculopathy symptoms. 3. Moderate multilevel disc degenerative disease of the cervical spine from C3 through C7, with ankylosis of C3-C4.  02-12-19: MRI: of  cervical spine:  Apparent interval removal the right laminar screw at C2 which appeared to intrude upon the canal. Continued 5 mm anterolisthesis of C1 and the nonunited dens fragment relative to the body of C2. Continued canal stenosis with AP diameter of the canal as narrow as 3.4 mm,  flattening of the cord and abnormal T2 cord signal. In this patient has had interval laminectomy at C1, it is difficult to explain how the AP diameter of the canal actually measures smaller than on the pre-surgical exam. This could relate to postoperative edema the posterior tissues. Lesser but potentially significant stenosis from C2-3 through C7-T1 as outlined above.  02-17-19: bilateral lower extremity doppler: Right: There is no evidence of deep vein thrombosis in the lower extremity. A cystic structure is found in the popliteal fossa. Left: There is no evidence of deep vein thrombosis in the lower extremity. No cystic structure found in the popliteal fossa.  NO NEW EXAMS.   LABS REVIEWED PREVIOUS:   02-07-19: wbc 10.9; hgb 11.6; hct 34.8; mcv 90.9; plt 172 glucose 149; bun 17; creat 0.68; k+ 4.3; na++ 135; ca 8.8  02-16-19: wbc 8.2; hgb 11.1; hct 33.4; mcv 91.0 plt 267 glucose 215; bun 14; creat 0.54; k+ 3.6; na++ 134; ca 8.6 liver normal albumin 2.7 urine culture: no growth  03-03-19: wbc 5.1; hgb 10.1; hct 30.9; mcv 92.8; plt 184; glucose 98; bun 15; create 0.59; k+ 3.7; na++ 137; ca 9.1  03-10-19: wbc 5.1; hgb 11.1; hct 35.7; mcv 93.7;plt 229; glucose 100; bun 16; creat 0.86; k+ 4.2; na++ 137; ca 9.2   NO NEW LABS.   Review of Systems  Constitutional: Negative for malaise/fatigue.  Respiratory: Negative for cough and shortness of breath.   Cardiovascular: Negative for chest pain, palpitations and leg swelling.  Gastrointestinal: Negative for abdominal pain, constipation and heartburn.  Musculoskeletal: Negative for back pain, joint pain and myalgias.  Skin: Negative.   Neurological: Negative for  dizziness.       Has numbness in both hands  Psychiatric/Behavioral: The patient is not nervous/anxious.     Physical Exam Constitutional:      General: She is not in acute distress.    Appearance: She is well-developed. She is not diaphoretic.     Comments: thin  Neck:     Musculoskeletal: Neck supple.     Thyroid: No thyromegaly.  Cardiovascular:     Rate and Rhythm: Normal rate and regular rhythm.     Pulses: Normal pulses.     Heart sounds: Normal heart sounds.  Pulmonary:     Effort: Pulmonary effort is normal. No respiratory distress.     Breath sounds: Normal breath sounds.  Abdominal:     General: Bowel sounds are normal. There is no distension.     Palpations: Abdomen is soft.     Tenderness: There is no abdominal tenderness.  Musculoskeletal:     Right lower leg: No edema.     Left lower leg: Edema present.     Comments: Is able to move all extremities Has c-collar in place    Lymphadenopathy:     Cervical: No cervical adenopathy.  Skin:    General: Skin is warm and dry.  Neurological:     Mental Status: She is alert and oriented to person, place, and time.  Psychiatric:        Mood and Affect: Mood normal.      ASSESSMENT/ PLAN:  TODAY:   1.  Stenosis of cervical spine with myelopathy 2. Central cord syndrome; sequela   She is doing well; at this time will stop her prn narcotic pain medications; will continue oxycodone prior to therapy and will continue to monitor her pain management.   MD is aware of resident's narcotic use and is in agreement with current  plan of care. We will attempt to wean resident as apropriate   Ok Edwards NP [facility]  Contact [phone] Monday through Friday 8am- 5pm  After hours call [phone]

## 2019-03-19 ENCOUNTER — Other Ambulatory Visit (HOSPITAL_COMMUNITY)
Admission: RE | Admit: 2019-03-19 | Discharge: 2019-03-19 | Disposition: A | Discharge status: Home / Self Care | Payer: Medicare Other | Source: Skilled Nursing Facility | Attending: Internal Medicine | Admitting: Internal Medicine

## 2019-03-19 DIAGNOSIS — Z1159 Encounter for screening for other viral diseases: Secondary | ICD-10-CM | POA: Insufficient documentation

## 2019-03-19 LAB — SARS CORONAVIRUS 2 BY RT PCR (HOSPITAL ORDER, PERFORMED IN ~~LOC~~ HOSPITAL LAB): SARS Coronavirus 2: NEGATIVE

## 2019-03-20 ENCOUNTER — Encounter: Payer: Self-pay | Admitting: Adult Health

## 2019-03-20 ENCOUNTER — Non-Acute Institutional Stay (SKILLED_NURSING_FACILITY): Payer: Medicare Other | Admitting: Adult Health

## 2019-03-20 DIAGNOSIS — S14129S Central cord syndrome at unspecified level of cervical spinal cord, sequela: Secondary | ICD-10-CM | POA: Diagnosis not present

## 2019-03-20 DIAGNOSIS — E785 Hyperlipidemia, unspecified: Secondary | ICD-10-CM

## 2019-03-20 DIAGNOSIS — G992 Myelopathy in diseases classified elsewhere: Secondary | ICD-10-CM

## 2019-03-20 DIAGNOSIS — K5909 Other constipation: Secondary | ICD-10-CM

## 2019-03-20 DIAGNOSIS — I951 Orthostatic hypotension: Secondary | ICD-10-CM

## 2019-03-20 DIAGNOSIS — M4802 Spinal stenosis, cervical region: Secondary | ICD-10-CM | POA: Diagnosis not present

## 2019-03-20 DIAGNOSIS — G4700 Insomnia, unspecified: Secondary | ICD-10-CM

## 2019-03-20 DIAGNOSIS — I471 Supraventricular tachycardia, unspecified: Secondary | ICD-10-CM

## 2019-03-20 DIAGNOSIS — K219 Gastro-esophageal reflux disease without esophagitis: Secondary | ICD-10-CM

## 2019-03-20 NOTE — Progress Notes (Signed)
Location:   [facility] Room Number: 128 P Place of Service:  SNF (31)   CODE STATUS: DNR  Allergies  Allergen Reactions  . Penicillins Hives, Swelling, Rash and Other (See Comments)    Did it involve swelling of the face/tongue/throat, SOB, or low BP? #  #  #  YES  #  #  #  Did it involve sudden or severe rash/hives, skin peeling, or any reaction on the inside of your mouth or nose? No Did you need to seek medical attention at a hospital or doctor's office? #  #  #  YES  #  #  #  When did it last happen?85 years old.   . Codeine Phosphate [Codeine] Other (See Comments)    headache    Chief Complaint  Patient presents with  . Medical Management of Chronic Issues    Supraventricular tachycardia; orthostatic hypotension; stenosis of cervrcal spine with myelopathy. Weekly follow up for the first 30 days post hospitalization     HPI:  She is a 85 year old short term patient of this facility being seen for the management of her chronic illnesses; svt; hypotension; cervical spine stenosis. She continues to participate in therapy. She continues to have numbness in her hands. She denies any uncontrolled pain; denies any changes in her appetite. She denies any insomnia. No repots of fevers.   Past Medical History:  Diagnosis Date  . Allergic rhinitis   . Anosmia   . Arthritis   . Full dentures   . History of diverticulitis   . Insomnia   . Insomnia   . Senile osteoporosis   . SOB (shortness of breath)   . SVT (supraventricular tachycardia) ([place])   . Varicose vein of leg    left  . Wears glasses     Past Surgical History:  Procedure Laterality Date  . COLON RESECTION  1989   diverticulitis  . COLONOSCOPY    . ESOPHAGEAL DILATION  2015   endoscopy  . POSTERIOR CERVICAL FUSION/FORAMINOTOMY N/A 02/11/2019   Procedure: POSTERIOR CERVICAL INSTRUMENTATION REMOVAL CERVICAL TWO;  Surgeon: Consuella Lose, MD;  Location: [facility];  Service:  Neurosurgery;  Laterality: N/A;  . POSTERIOR CERVICAL LAMINECTOMY N/A 02/06/2019   Procedure: Cervical one LAMINECTOMY, Occiput-Cervical four fusion;  Surgeon: Consuella Lose, MD;  Location: [facility];  Service: Neurosurgery;  Laterality: N/A;    Social History   Socioeconomic History  . Marital status: Widowed    Spouse name: Not on file  . Number of children: Not on file  . Years of education: Not on file  . Highest education level: Not on file  Occupational History  . Not on file  Social Needs  . Financial resource strain: Not on file  . Food insecurity    Worry: Not on file    Inability: Not on file  . Transportation needs    Medical: Not on file    Non-medical: Not on file  Tobacco Use  . Smoking status: Never Smoker  . Smokeless tobacco: Never Used  Substance and Sexual Activity  . Alcohol use: Yes    Comment: rare  . Drug use: No  . Sexual activity: Not on file  Lifestyle  . Physical activity    Days per week: Not on file    Minutes per session: Not on file  . Stress: Not on file  Relationships  . Social connections    Talks on phone: Not on file  Gets together: Not on file    Attends religious service: Not on file    Active member of club or organization: Not on file    Attends meetings of clubs or organizations: Not on file    Relationship status: Not on file  . Intimate partner violence    Fear of current or ex partner: Not on file    Emotionally abused: Not on file    Physically abused: Not on file    Forced sexual activity: Not on file  Other Topics Concern  . Not on file  Social History Narrative  . Not on file   Family History  Problem Relation Age of Onset  . Cancer Mother   . High blood pressure Father   . Emphysema Brother       VITAL SIGNS BP (!) 141/78   Pulse 61   Temp (!) 97.2 F (36.2 C)   Resp 20   Ht 5\' 3"  (1.6 m)   Wt 132 lb (59.9 kg)   BMI 23.38 kg/m   Outpatient Encounter Medications as of 03/20/2019  Medication Sig   . acetaminophen (TYLENOL) 325 MG tablet Take 1-2 tablets (325-650 mg total) by mouth every 4 (four) hours as needed for mild pain.  . calcium-vitamin D (OSCAL WITH D) 500-200 MG-UNIT per tablet Take 1 tablet by mouth 2 (two) times daily.   . diclofenac sodium (VOLTAREN) 1 % GEL Apply 2 g topically 4 (four) times daily.  . fludrocortisone (FLORINEF) 0.1 MG tablet Take 1 tablet (0.1 mg total) by mouth daily.  05/20/2019 gabapentin (NEURONTIN) 300 MG capsule Take 1 capsule (300 mg total) by mouth 3 (three) times daily.  . metoprolol succinate (TOPROL-XL) 25 MG 24 hr tablet Take 12.5 mg by mouth daily.  . NON FORMULARY Diet Type: Regular  . Nutritional Supplements (ENSURE ENLIVE PO) Take 1 Bottle by mouth daily. Prefers Vanilla  . Oxycodone HCl 10 MG TABS Take 10 mg by mouth daily. Give every morning prior to therapy  . pantoprazole (PROTONIX) 40 MG tablet Take 1 tablet (40 mg total) by mouth 2 (two) times daily.  . polyethylene glycol (MIRALAX / GLYCOLAX) 17 g packet Take 17 g by mouth daily.  . rosuvastatin (CRESTOR) 5 MG tablet Take 5 mg by mouth daily.  Marland Kitchen senna-docusate (SENOKOT-S) 8.6-50 MG tablet Take 2 tablets by mouth 2 (two) times daily.  . traZODone (DESYREL) 50 MG tablet Take 25 mg by mouth at bedtime.  . [DISCONTINUED] traMADol (ULTRAM) 50 MG tablet Take 1 tablet (50 mg total) by mouth every 6 (six) hours as needed for moderate pain. (Patient not taking: Reported on 03/20/2019)   No facility-administered encounter medications on file as of 03/20/2019.      SIGNIFICANT DIAGNOSTIC EXAMS   PREVIOUS;  02-10-19: ct of cervical spine:  1. Status post posterior occipitocervical fusion to the level of C3-C4, there is improved alignment of a chronic fracture of the dens, with approximately 5 mm anterolisthesis of C1 on C2. 2. Left approach right-sided C2 laminar screw intrudes into the posterior spinal canal and right lateral recess (series 8, image 32). Correlate for referable radiculopathy  symptoms. 3. Moderate multilevel disc degenerative disease of the cervical spine from C3 through C7, with ankylosis of C3-C4.  02-12-19: MRI: of cervical spine:  Apparent interval removal the right laminar screw at C2 which appeared to intrude upon the canal. Continued 5 mm anterolisthesis of C1 and the nonunited dens fragment relative to the body of C2. Continued canal stenosis  with AP diameter of the canal as narrow as 3.4 mm, flattening of the cord and abnormal T2 cord signal. In this patient has had interval laminectomy at C1, it is difficult to explain how the AP diameter of the canal actually measures smaller than on the pre-surgical exam. This could relate to postoperative edema the posterior tissues. Lesser but potentially significant stenosis from C2-3 through C7-T1 as outlined above.  02-17-19: bilateral lower extremity doppler: Right: There is no evidence of deep vein thrombosis in the lower extremity. A cystic structure is found in the popliteal fossa. Left: There is no evidence of deep vein thrombosis in the lower extremity. No cystic structure found in the popliteal fossa.  NO NEW EXAMS.   LABS REVIEWED PREVIOUS:   02-07-19: wbc 10.9; hgb 11.6; hct 34.8; mcv 90.9; plt 172 glucose 149; bun 17; creat 0.68; k+ 4.3; na++ 135; ca 8.8  02-16-19: wbc 8.2; hgb 11.1; hct 33.4; mcv 91.0 plt 267 glucose 215; bun 14; creat 0.54; k+ 3.6; na++ 134; ca 8.6 liver normal albumin 2.7 urine culture: no growth  03-03-19: wbc 5.1; hgb 10.1; hct 30.9; mcv 92.8; plt 184; glucose 98; bun 15; create 0.59; k+ 3.7; na++ 137; ca 9.1  03-10-19: wbc 5.1; hgb 11.1; hct 35.7; mcv 93.7;plt 229; glucose 100; bun 16; creat 0.86; k+ 4.2; na++ 137; ca 9.2   NO NEW LABS.    Review of Systems  Constitutional: Negative for malaise/fatigue.  Respiratory: Negative for cough and shortness of breath.   Cardiovascular: Negative for chest pain, palpitations and leg swelling.  Gastrointestinal: Negative for abdominal pain,  constipation and heartburn.  Musculoskeletal: Negative for back pain, joint pain and myalgias.  Skin: Negative.   Neurological: Negative for dizziness.       Hands numbness in both hands   Psychiatric/Behavioral: The patient is not nervous/anxious.      Physical Exam Constitutional:      General: She is not in acute distress.    Appearance: She is well-developed. She is not diaphoretic.     Comments: thin  Neck:     Musculoskeletal: Neck supple.     Thyroid: No thyromegaly.  Cardiovascular:     Rate and Rhythm: Normal rate and regular rhythm.     Pulses: Normal pulses.     Heart sounds: Normal heart sounds.  Pulmonary:     Effort: Pulmonary effort is normal. No respiratory distress.     Breath sounds: Normal breath sounds.  Abdominal:     General: Bowel sounds are normal. There is no distension.     Palpations: Abdomen is soft.     Tenderness: There is no abdominal tenderness.  Musculoskeletal:     Right lower leg: No edema.     Left lower leg: No edema.     Comments: Is able to move all extremities Has c-collar in place     Lymphadenopathy:     Cervical: No cervical adenopathy.  Skin:    General: Skin is warm and dry.  Neurological:     Mental Status: She is alert and oriented to person, place, and time.  Psychiatric:        Mood and Affect: Mood normal.       ASSESSMENT/ PLAN:  TODAY:   1. Supraventricular tachycardia: heart rate is stable: will continue toprol xl 12.5 mg daily   2. Orthostatic hypotension: stable b/p 141/78: will continue florinef 0.1 mg daily will monitor  3. GERD without esophagitis: is stable will continue protonix 40 mg  twice daily   4. Chronic constipation: is stable will continue senna s 2 tabs twice daily and miralax daily   5. Dyslipidemia: is stable will continue crestor 5 mg daily   6. Insomnia; is stable will continue trazodone 25 mg nightly   7. 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 We will attempt to wean resident as apropriate   Denise Innocenteborah  NP [facility]  Contact [phone] Monday through Friday 8am- 5pm  After hours call [phone]

## 2019-03-22 DIAGNOSIS — I471 Supraventricular tachycardia, unspecified: Secondary | ICD-10-CM | POA: Insufficient documentation

## 2019-03-22 DIAGNOSIS — K5909 Other constipation: Secondary | ICD-10-CM | POA: Insufficient documentation

## 2019-03-22 DIAGNOSIS — K219 Gastro-esophageal reflux disease without esophagitis: Secondary | ICD-10-CM | POA: Insufficient documentation

## 2019-03-22 DIAGNOSIS — G4700 Insomnia, unspecified: Secondary | ICD-10-CM | POA: Insufficient documentation

## 2019-03-22 DIAGNOSIS — E785 Hyperlipidemia, unspecified: Secondary | ICD-10-CM | POA: Insufficient documentation

## 2019-03-25 ENCOUNTER — Encounter: Payer: Self-pay | Admitting: Adult Health

## 2019-03-25 ENCOUNTER — Non-Acute Institutional Stay (SKILLED_NURSING_FACILITY): Payer: Medicare Other | Admitting: Adult Health

## 2019-03-25 ENCOUNTER — Other Ambulatory Visit: Payer: Self-pay | Admitting: Adult Health

## 2019-03-25 DIAGNOSIS — M4802 Spinal stenosis, cervical region: Secondary | ICD-10-CM | POA: Diagnosis not present

## 2019-03-25 DIAGNOSIS — G992 Myelopathy in diseases classified elsewhere: Secondary | ICD-10-CM

## 2019-03-25 DIAGNOSIS — S14129S Central cord syndrome at unspecified level of cervical spinal cord, sequela: Secondary | ICD-10-CM

## 2019-03-25 MED ORDER — OXYCODONE HCL 5 MG PO TABS: 5.0000 mg | ORAL_TABLET | Freq: Three times a day (TID) | ORAL | 0 refills | Status: DC | PRN

## 2019-03-25 NOTE — Progress Notes (Signed)
Location:   [facility] Room Number: [id number] Place of Service:  SNF (31)   CODE STATUS: DNR  Allergies  Allergen Reactions  . Penicillins Hives, Swelling, Rash and Other (See Comments)    Did it involve swelling of the face/tongue/throat, SOB, or low BP? #  #  #  YES  #  #  #  Did it involve sudden or severe rash/hives, skin peeling, or any reaction on the inside of your mouth or nose? No Did you need to seek medical attention at a hospital or doctor's office? #  #  #  YES  #  #  #  When did it last happen?85 years old.   . Codeine Phosphate [Codeine] Other (See Comments)    headache    Chief Complaint  Patient presents with  . Acute Visit    Pain Management    HPI:  She is presently taking oxycodone 10 mg daily prior to therapy. She has been loosening her cervical collar at times. She told me that she has been bending over the past day which causing her pain to be worse. She states that OTC medications do help with her pain management. We have discussed her pain medication regimen; the importance of weaning off prescription pain medications. At this time; she would like to have prn medications. She denies any insomnia. She does not like the food here; but denies a poor appetite.    Past Medical History:  Diagnosis Date  . Allergic rhinitis   . Anosmia   . Arthritis   . Full dentures   . History of diverticulitis   . Insomnia   . Insomnia   . Senile osteoporosis   . SOB (shortness of breath)   . SVT (supraventricular tachycardia) ([place])   . Varicose vein of leg    left  . Wears glasses     Past Surgical History:  Procedure Laterality Date  . COLON RESECTION  1989   diverticulitis  . COLONOSCOPY    . ESOPHAGEAL DILATION  2015   endoscopy  . POSTERIOR CERVICAL FUSION/FORAMINOTOMY N/A 02/11/2019   Procedure: POSTERIOR CERVICAL INSTRUMENTATION REMOVAL CERVICAL TWO;  Surgeon: Consuella Lose, MD;  Location: [facility];  Service: Neurosurgery;   Laterality: N/A;  . POSTERIOR CERVICAL LAMINECTOMY N/A 02/06/2019   Procedure: Cervical one LAMINECTOMY, Occiput-Cervical four fusion;  Surgeon: Consuella Lose, MD;  Location: [facility];  Service: Neurosurgery;  Laterality: N/A;    Social History   Socioeconomic History  . Marital status: Widowed    Spouse name: Not on file  . Number of children: Not on file  . Years of education: Not on file  . Highest education level: Not on file  Occupational History  . Not on file  Social Needs  . Financial resource strain: Not on file  . Food insecurity    Worry: Not on file    Inability: Not on file  . Transportation needs    Medical: Not on file    Non-medical: Not on file  Tobacco Use  . Smoking status: Never Smoker  . Smokeless tobacco: Never Used  Substance and Sexual Activity  . Alcohol use: Yes    Comment: rare  . Drug use: No  . Sexual activity: Not on file  Lifestyle  . Physical activity    Days per week: Not on file    Minutes per session: Not on file  . Stress: Not on file  Relationships  . Social connections  Talks on phone: Not on file    Gets together: Not on file    Attends religious service: Not on file    Active member of club or organization: Not on file    Attends meetings of clubs or organizations: Not on file    Relationship status: Not on file  . Intimate partner violence    Fear of current or ex partner: Not on file    Emotionally abused: Not on file    Physically abused: Not on file    Forced sexual activity: Not on file  Other Topics Concern  . Not on file  Social History Narrative  . Not on file   Family History  Problem Relation Age of Onset  . Cancer Mother   . High blood pressure Father   . Emphysema Brother       VITAL SIGNS BP 106/64   Pulse 66   Temp 98.4 F (36.9 C)   Resp 20   Ht 5\' 3"  (1.6 m)   Wt 134 lb 3.2 oz (60.9 kg)   BMI 23.77 kg/m   Outpatient Encounter Medications as of 03/25/2019  Medication Sig  .  acetaminophen (TYLENOL) 325 MG tablet Take 1-2 tablets (325-650 mg total) by mouth every 4 (four) hours as needed for mild pain.  . calcium-vitamin D (OSCAL WITH D) 500-200 MG-UNIT per tablet Take 1 tablet by mouth 2 (two) times daily.   Marland Kitchen. Dextran 70-Hypromellose (ARTIFICIAL TEARS) 0.1-0.3 % SOLN Place 1 drop into both eyes 3 (three) times daily as needed.  . diclofenac sodium (VOLTAREN) 1 % GEL Apply 2 g topically 4 (four) times daily.  . fludrocortisone (FLORINEF) 0.1 MG tablet Take 1 tablet (0.1 mg total) by mouth daily.  Marland Kitchen. gabapentin (NEURONTIN) 300 MG capsule Take 1 capsule (300 mg total) by mouth 3 (three) times daily.  . metoprolol succinate (TOPROL-XL) 25 MG 24 hr tablet Take 12.5 mg by mouth daily.  . NON FORMULARY Diet Type: Regular  . Nutritional Supplements (ENSURE ENLIVE PO) Take 1 Bottle by mouth daily. Prefers Vanilla  . Oxycodone HCl 10 MG TABS Take 10 mg by mouth daily. Give every morning prior to therapy  . pantoprazole (PROTONIX) 40 MG tablet Take 1 tablet (40 mg total) by mouth 2 (two) times daily.  . polyethylene glycol (MIRALAX / GLYCOLAX) 17 g packet Take 17 g by mouth daily.  . rosuvastatin (CRESTOR) 5 MG tablet Take 5 mg by mouth daily.  Marland Kitchen. senna-docusate (SENOKOT-S) 8.6-50 MG tablet Take 2 tablets by mouth 2 (two) times daily.  . traZODone (DESYREL) 50 MG tablet Take 25 mg by mouth at bedtime.   No facility-administered encounter medications on file as of 03/25/2019.      SIGNIFICANT DIAGNOSTIC EXAMS   PREVIOUS;  02-10-19: ct of cervical spine:  1. Status post posterior occipitocervical fusion to the level of C3-C4, there is improved alignment of a chronic fracture of the dens, with approximately 5 mm anterolisthesis of C1 on C2. 2. Left approach right-sided C2 laminar screw intrudes into the posterior spinal canal and right lateral recess (series 8, image 32). Correlate for referable radiculopathy symptoms. 3. Moderate multilevel disc degenerative disease of the  cervical spine from C3 through C7, with ankylosis of C3-C4.  02-12-19: MRI: of cervical spine:  Apparent interval removal the right laminar screw at C2 which appeared to intrude upon the canal. Continued 5 mm anterolisthesis of C1 and the nonunited dens fragment relative to the body of C2. Continued canal stenosis with  AP diameter of the canal as narrow as 3.4 mm, flattening of the cord and abnormal T2 cord signal. In this patient has had interval laminectomy at C1, it is difficult to explain how the AP diameter of the canal actually measures smaller than on the pre-surgical exam. This could relate to postoperative edema the posterior tissues. Lesser but potentially significant stenosis from C2-3 through C7-T1 as outlined above.  02-17-19: bilateral lower extremity doppler: Right: There is no evidence of deep vein thrombosis in the lower extremity. A cystic structure is found in the popliteal fossa. Left: There is no evidence of deep vein thrombosis in the lower extremity. No cystic structure found in the popliteal fossa.  NO NEW EXAMS.   LABS REVIEWED PREVIOUS:   02-07-19: wbc 10.9; hgb 11.6; hct 34.8; mcv 90.9; plt 172 glucose 149; bun 17; creat 0.68; k+ 4.3; na++ 135; ca 8.8  02-16-19: wbc 8.2; hgb 11.1; hct 33.4; mcv 91.0 plt 267 glucose 215; bun 14; creat 0.54; k+ 3.6; na++ 134; ca 8.6 liver normal albumin 2.7 urine culture: no growth  03-03-19: wbc 5.1; hgb 10.1; hct 30.9; mcv 92.8; plt 184; glucose 98; bun 15; create 0.59; k+ 3.7; na++ 137; ca 9.1  03-10-19: wbc 5.1; hgb 11.1; hct 35.7; mcv 93.7;plt 229; glucose 100; bun 16; creat 0.86; k+ 4.2; na++ 137; ca 9.2   NO NEW LABS.    Review of Systems  Constitutional: Negative for malaise/fatigue.  Respiratory: Negative for cough and shortness of breath.   Cardiovascular: Negative for chest pain, palpitations and leg swelling.  Gastrointestinal: Negative for abdominal pain, constipation and heartburn.  Musculoskeletal: Positive for joint pain.  Negative for back pain and myalgias.       Has neck pain   Skin: Negative.   Neurological: Negative for dizziness.       Numbness in both hands   Psychiatric/Behavioral: The patient is not nervous/anxious.     Physical Exam Constitutional:      General: She is not in acute distress.    Appearance: She is well-developed. She is not diaphoretic.     Comments: thin  Neck:     Musculoskeletal: Neck supple.     Thyroid: No thyromegaly.  Cardiovascular:     Rate and Rhythm: Normal rate and regular rhythm.     Pulses: Normal pulses.     Heart sounds: Normal heart sounds.  Pulmonary:     Effort: Pulmonary effort is normal. No respiratory distress.     Breath sounds: Normal breath sounds.  Abdominal:     General: Bowel sounds are normal. There is no distension.     Palpations: Abdomen is soft.     Tenderness: There is no abdominal tenderness.  Musculoskeletal:     Right lower leg: No edema.     Left lower leg: No edema.     Comments: Cervical collar in place Is able to move all extremities  Wearing bilateral lower extremity ace wraps and abdominal binder   Lymphadenopathy:     Cervical: No cervical adenopathy.  Skin:    General: Skin is warm and dry.  Neurological:     Mental Status: She is alert and oriented to person, place, and time.  Psychiatric:        Mood and Affect: Mood normal.      ASSESSMENT/ PLAN:  TODAY:   1. 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      MD is aware of resident's narcotic use and is in agreement with current plan of care. We will attempt to wean resident as apropriate   Synthia Innocenteborah Garrit Marrow NP [facility]  Contact [phone] Monday through Friday 8am-  5pm  After hours call [phone]

## 2019-03-26 ENCOUNTER — Encounter: Payer: Self-pay | Admitting: Adult Health

## 2019-03-26 ENCOUNTER — Non-Acute Institutional Stay (SKILLED_NURSING_FACILITY): Payer: Medicare Other | Admitting: Adult Health

## 2019-03-26 DIAGNOSIS — G992 Myelopathy in diseases classified elsewhere: Secondary | ICD-10-CM | POA: Diagnosis not present

## 2019-03-26 DIAGNOSIS — M4802 Spinal stenosis, cervical region: Secondary | ICD-10-CM | POA: Diagnosis not present

## 2019-03-26 DIAGNOSIS — I951 Orthostatic hypotension: Secondary | ICD-10-CM | POA: Diagnosis not present

## 2019-03-26 DIAGNOSIS — S14129S Central cord syndrome at unspecified level of cervical spinal cord, sequela: Secondary | ICD-10-CM | POA: Diagnosis not present

## 2019-03-26 NOTE — Progress Notes (Signed)
Location:    [facility] Room Number: 128 P Place of Service:  SNF (31)   CODE STATUS: DNR  Allergies  Allergen Reactions  . Penicillins Hives, Swelling, Rash and Other (See Comments)    Did it involve swelling of the face/tongue/throat, SOB, or low BP? #  #  #  YES  #  #  #  Did it involve sudden or severe rash/hives, skin peeling, or any reaction on the inside of your mouth or nose? No Did you need to seek medical attention at a hospital or doctor's office? #  #  #  YES  #  #  #  When did it last happen?85 years old.   . Codeine Phosphate [Codeine] Other (See Comments)    headache    Chief Complaint  Patient presents with  . Acute Visit    Care Plan Meeting    HPI:  We have come together for her routine care plan meeting does have family present via phone. BIMS 13/15 depression: 08/30.She does not have feelings in her hands. She will not be able to perform certain tasks such as cooking. She is going have to wrap her lower extremities long term. This is not a task she will be do on her own. We will need to get her compression hose; she will need to continue wearing her abdominal binder. Her pain is presently being managed. She will loosen her c-collar at times and at times will forget to use her reacher.    Past Medical History:  Diagnosis Date  . Allergic rhinitis   . Anosmia   . Arthritis   . Full dentures   . History of diverticulitis   . Insomnia   . Insomnia   . Senile osteoporosis   . SOB (shortness of breath)   . SVT (supraventricular tachycardia) ([facility])   . Varicose vein of leg    left  . Wears glasses     Past Surgical History:  Procedure Laterality Date  . COLON RESECTION  1989   diverticulitis  . COLONOSCOPY    . ESOPHAGEAL DILATION  2015   endoscopy  . POSTERIOR CERVICAL FUSION/FORAMINOTOMY N/A 02/11/2019   Procedure: POSTERIOR CERVICAL INSTRUMENTATION REMOVAL CERVICAL TWO;  Surgeon: Consuella Lose, MD;  Location: [facility];  Service: Neurosurgery;  Laterality: N/A;  . POSTERIOR CERVICAL LAMINECTOMY N/A 02/06/2019   Procedure: Cervical one LAMINECTOMY, Occiput-Cervical four fusion;  Surgeon: Consuella Lose, MD;  Location: [facility];  Service: Neurosurgery;  Laterality: N/A;    Social History   Socioeconomic History  . Marital status: Widowed    Spouse name: Not on file  . Number of children: Not on file  . Years of education: Not on file  . Highest education level: Not on file  Occupational History  . Not on file  Social Needs  . Financial resource strain: Not on file  . Food insecurity    Worry: Not on file    Inability: Not on file  . Transportation needs    Medical: Not on file    Non-medical: Not on file  Tobacco Use  . Smoking status: Never Smoker  . Smokeless tobacco: Never Used  Substance and Sexual Activity  . Alcohol use: Yes    Comment: rare  . Drug use: No  . Sexual activity: Not on file  Lifestyle  . Physical activity    Days per week: Not on file    Minutes per session: Not on file  .  Stress: Not on file  Relationships  . Social Musician on phone: Not on file    Gets together: Not on file    Attends religious service: Not on file    Active member of club or organization: Not on file    Attends meetings of clubs or organizations: Not on file    Relationship status: Not on file  . Intimate partner violence    Fear of current or ex partner: Not on file    Emotionally abused: Not on file    Physically abused: Not on file    Forced sexual activity: Not on file  Other Topics Concern  . Not on file  Social History Narrative  . Not on file   Family History  Problem Relation Age of Onset  . Cancer Mother   . High blood pressure Father   . Emphysema Brother       VITAL SIGNS BP 132/69   Pulse 60   Temp 98.3 F (36.8 C)   Resp 20   Ht 5\' 3"  (1.6 m)   Wt 134 lb 3.2 oz (60.9 kg)   BMI 23.77 kg/m   Outpatient Encounter Medications as of 03/26/2019   Medication Sig  . acetaminophen (TYLENOL) 325 MG tablet Take 1-2 tablets (325-650 mg total) by mouth every 4 (four) hours as needed for mild pain.  . calcium-vitamin D (OSCAL WITH D) 500-200 MG-UNIT per tablet Take 1 tablet by mouth 2 (two) times daily.   Marland Kitchen Dextran 70-Hypromellose (ARTIFICIAL TEARS) 0.1-0.3 % SOLN Place 1 drop into both eyes 3 (three) times daily as needed.  . diclofenac sodium (VOLTAREN) 1 % GEL Apply 2 g topically 4 (four) times daily.  . fludrocortisone (FLORINEF) 0.1 MG tablet Take 1 tablet (0.1 mg total) by mouth daily.  Marland Kitchen gabapentin (NEURONTIN) 300 MG capsule Take 1 capsule (300 mg total) by mouth 3 (three) times daily.  . metoprolol succinate (TOPROL-XL) 25 MG 24 hr tablet Take 12.5 mg by mouth daily.  . NON FORMULARY Diet Type: Regular  . Nutritional Supplements (ENSURE ENLIVE PO) Take 1 Bottle by mouth daily. Prefers Vanilla  . oxyCODONE (ROXICODONE) 5 MG immediate release tablet Take 1 tablet (5 mg total) by mouth every 8 (eight) hours as needed for up to 7 days for severe pain.  . Oxycodone HCl 10 MG TABS Take 10 mg by mouth daily. Give every morning prior to therapy  . pantoprazole (PROTONIX) 40 MG tablet Take 1 tablet (40 mg total) by mouth 2 (two) times daily.  . polyethylene glycol (MIRALAX / GLYCOLAX) 17 g packet Take 17 g by mouth daily.  . rosuvastatin (CRESTOR) 5 MG tablet Take 5 mg by mouth daily.  . sennosides-docusate sodium (SENOKOT-S) 8.6-50 MG tablet Take 2 tablets by mouth 2 (two) times daily as needed for constipation.  . traZODone (DESYREL) 50 MG tablet Take 25 mg by mouth at bedtime.  . [DISCONTINUED] senna-docusate (SENOKOT-S) 8.6-50 MG tablet Take 2 tablets by mouth 2 (two) times daily. (Patient not taking: Reported on 03/26/2019)   No facility-administered encounter medications on file as of 03/26/2019.      SIGNIFICANT DIAGNOSTIC EXAMS   PREVIOUS;  02-10-19: ct of cervical spine:  1. Status post posterior occipitocervical fusion to the level  of C3-C4, there is improved alignment of a chronic fracture of the dens, with approximately 5 mm anterolisthesis of C1 on C2. 2. Left approach right-sided C2 laminar screw intrudes into the posterior spinal canal and right lateral  recess (series 8, image 32). Correlate for referable radiculopathy symptoms. 3. Moderate multilevel disc degenerative disease of the cervical spine from C3 through C7, with ankylosis of C3-C4.  02-12-19: MRI: of cervical spine:  Apparent interval removal the right laminar screw at C2 which appeared to intrude upon the canal. Continued 5 mm anterolisthesis of C1 and the nonunited dens fragment relative to the body of C2. Continued canal stenosis with AP diameter of the canal as narrow as 3.4 mm, flattening of the cord and abnormal T2 cord signal. In this patient has had interval laminectomy at C1, it is difficult to explain how the AP diameter of the canal actually measures smaller than on the pre-surgical exam. This could relate to postoperative edema the posterior tissues. Lesser but potentially significant stenosis from C2-3 through C7-T1 as outlined above.  02-17-19: bilateral lower extremity doppler: Right: There is no evidence of deep vein thrombosis in the lower extremity. A cystic structure is found in the popliteal fossa. Left: There is no evidence of deep vein thrombosis in the lower extremity. No cystic structure found in the popliteal fossa.  NO NEW EXAMS.   LABS REVIEWED PREVIOUS:   02-07-19: wbc 10.9; hgb 11.6; hct 34.8; mcv 90.9; plt 172 glucose 149; bun 17; creat 0.68; k+ 4.3; na++ 135; ca 8.8  02-16-19: wbc 8.2; hgb 11.1; hct 33.4; mcv 91.0 plt 267 glucose 215; bun 14; creat 0.54; k+ 3.6; na++ 134; ca 8.6 liver normal albumin 2.7 urine culture: no growth  03-03-19: wbc 5.1; hgb 10.1; hct 30.9; mcv 92.8; plt 184; glucose 98; bun 15; create 0.59; k+ 3.7; na++ 137; ca 9.1  03-10-19: wbc 5.1; hgb 11.1; hct 35.7; mcv 93.7;plt 229; glucose 100; bun 16; creat 0.86;  k+ 4.2; na++ 137; ca 9.2   NO NEW LABS.    Review of Systems  Constitutional: Negative for malaise/fatigue.  Respiratory: Negative for cough and shortness of breath.   Cardiovascular: Negative for chest pain, palpitations and leg swelling.  Gastrointestinal: Negative for abdominal pain, constipation and heartburn.  Musculoskeletal: Negative for back pain, joint pain and myalgias.  Skin: Negative.   Neurological: Negative for dizziness.       Numbness in both hands   Psychiatric/Behavioral: The patient is not nervous/anxious.      Physical Exam Constitutional:      General: She is not in acute distress.    Appearance: She is well-developed. She is not diaphoretic.     Comments: thin  Neck:     Musculoskeletal: Neck supple.     Thyroid: No thyromegaly.  Cardiovascular:     Rate and Rhythm: Normal rate and regular rhythm.     Pulses: Normal pulses.     Heart sounds: Normal heart sounds.  Pulmonary:     Effort: Pulmonary effort is normal. No respiratory distress.     Breath sounds: Normal breath sounds.  Abdominal:     General: Bowel sounds are normal. There is no distension.     Palpations: Abdomen is soft.     Tenderness: There is no abdominal tenderness.  Musculoskeletal:     Right lower leg: No edema.     Left lower leg: No edema.     Comments: Cervical collar in place Is able to move all extremities  Wearing bilateral lower extremity ace wraps and abdominal binder    Lymphadenopathy:     Cervical: No cervical adenopathy.  Skin:    General: Skin is warm and dry.  Neurological:     Mental  Status: She is alert and oriented to person, place, and time.  Psychiatric:        Mood and Affect: Mood normal.      ASSESSMENT/ PLAN:  TODAY:   1. Orthostatic hypotension 2. Central cord syndrome sequela 3. Stenosis of cervical spine with myelopathy   Will continue therapy as directed Her goal remains to return back home Will contact Dr. Mathis DadNudlkumar for compression  stockings Will continue to monitor her status.       MD is aware of resident's narcotic use and is in agreement with current plan of care. We will attempt to wean resident as apropriate   Synthia Innocenteborah Luz Mares NP [facility]  Contact [phone] Monday through Friday 8am- 5pm  After hours call [phone]

## 2019-03-27 ENCOUNTER — Non-Acute Institutional Stay (SKILLED_NURSING_FACILITY): Payer: Medicare Other | Admitting: Adult Health

## 2019-03-27 ENCOUNTER — Encounter: Payer: Self-pay | Admitting: Adult Health

## 2019-03-27 ENCOUNTER — Encounter (HOSPITAL_COMMUNITY)
Admission: RE | Admit: 2019-03-27 | Discharge: 2019-03-27 | Disposition: A | Discharge status: Home / Self Care | Payer: Medicare Other | Source: Skilled Nursing Facility | Attending: Internal Medicine | Admitting: Internal Medicine

## 2019-03-27 DIAGNOSIS — D62 Acute posthemorrhagic anemia: Secondary | ICD-10-CM | POA: Insufficient documentation

## 2019-03-27 DIAGNOSIS — S14129S Central cord syndrome at unspecified level of cervical spinal cord, sequela: Secondary | ICD-10-CM

## 2019-03-27 DIAGNOSIS — I471 Supraventricular tachycardia, unspecified: Secondary | ICD-10-CM

## 2019-03-27 DIAGNOSIS — I951 Orthostatic hypotension: Secondary | ICD-10-CM

## 2019-03-27 DIAGNOSIS — M4802 Spinal stenosis, cervical region: Secondary | ICD-10-CM

## 2019-03-27 DIAGNOSIS — G992 Myelopathy in diseases classified elsewhere: Secondary | ICD-10-CM

## 2019-03-27 LAB — BASIC METABOLIC PANEL
Anion gap: 10 (ref 5–15)
BUN: 16 mg/dL (ref 8–23)
CO2: 26 mmol/L (ref 22–32)
Calcium: 9 mg/dL (ref 8.9–10.3)
Chloride: 105 mmol/L (ref 98–111)
Creatinine, Ser: 0.52 mg/dL (ref 0.44–1.00)
GFR calc Af Amer: >60 mL/min (ref >60)
GFR calc non Af Amer: >60 mL/min (ref >60)
Glucose, Bld: 140 mg/dL — ABNORMAL HIGH (ref 70–99)
Potassium: 3.8 mmol/L (ref 3.5–5.1)
Sodium: 141 mmol/L (ref 135–145)

## 2019-03-27 LAB — CBC
HCT: 37.2 % (ref 36.0–46.0)
Hemoglobin: 11.3 g/dL — ABNORMAL LOW (ref 12.0–15.0)
MCH: 29.1 pg (ref 26.0–34.0)
MCHC: 30.4 g/dL (ref 30.0–36.0)
MCV: 95.9 fL (ref 80.0–100.0)
Platelets: 235 10*3/uL (ref 150–400)
RBC: 3.88 MIL/uL (ref 3.87–5.11)
RDW: 13.6 % (ref 11.5–15.5)
WBC: 4.6 10*3/uL (ref 4.0–10.5)
nRBC: 0 % (ref 0.0–0.2)

## 2019-03-27 NOTE — Progress Notes (Signed)
Location:  [facility]    [facility] Home Room Number: [phone] Place of Service:  SNF (31)   CODE STATUS: DNR  Allergies  Allergen Reactions  . Penicillins Hives, Swelling, Rash and Other (See Comments)    Did it involve swelling of the face/tongue/throat, SOB, or low BP? #  #  #  YES  #  #  #  Did it involve sudden or severe rash/hives, skin peeling, or any reaction on the inside of your mouth or nose? No Did you need to seek medical attention at a hospital or doctor's office? #  #  #  YES  #  #  #  When did it last happen?85 years old.   . Codeine Phosphate [Codeine] Other (See Comments)    headache    Chief Complaint  Patient presents with  . Medical Management of Chronic Issues      Stenosis of cervical spine with myelopathy/central cord syndrome sequela: Supraventricular tachycardia:  Orthostatic hypotension  Weekly follow up for the first 30 days post hospitalization.     HPI:  She is a 85 year old short term rehab patient being seen for the management of her chronic illnesses; cervical stenosis; central cord syndrome; orthostatic hypotension. She denies any uncontrolled pain; continues with numbness to both hands. Denies any insomnia; no anxiety. There are no reports of fevers present.   Past Medical History:  Diagnosis Date  . Allergic rhinitis   . Anosmia   . Arthritis   . Full dentures   . History of diverticulitis   . Insomnia   . Insomnia   . Senile osteoporosis   . SOB (shortness of breath)   . SVT (supraventricular tachycardia) ([facility])   . Varicose vein of leg    left  . Wears glasses     Past Surgical History:  Procedure Laterality Date  . COLON RESECTION  1989   diverticulitis  . COLONOSCOPY    . ESOPHAGEAL DILATION  2015   endoscopy  . POSTERIOR CERVICAL FUSION/FORAMINOTOMY N/A 02/11/2019   Procedure: POSTERIOR CERVICAL INSTRUMENTATION REMOVAL CERVICAL TWO;  Surgeon: Lisbeth Renshaw, MD;  Location: [facility];  Service: Neurosurgery;   Laterality: N/A;  . POSTERIOR CERVICAL LAMINECTOMY N/A 02/06/2019   Procedure: Cervical one LAMINECTOMY, Occiput-Cervical four fusion;  Surgeon: Lisbeth Renshaw, MD;  Location: [facility] OR;  Service: Neurosurgery;  Laterality: N/A;    Social History   Socioeconomic History  . Marital status: Widowed    Spouse name: Not on file  . Number of children: Not on file  . Years of education: Not on file  . Highest education level: Not on file  Occupational History  . Not on file  Social Needs  . Financial resource strain: Not on file  . Food insecurity    Worry: Not on file    Inability: Not on file  . Transportation needs    Medical: Not on file    Non-medical: Not on file  Tobacco Use  . Smoking status: Never Smoker  . Smokeless tobacco: Never Used  Substance and Sexual Activity  . Alcohol use: Yes    Comment: rare  . Drug use: No  . Sexual activity: Not on file  Lifestyle  . Physical activity    Days per week: Not on file    Minutes per session: Not on file  . Stress: Not on file  Relationships  . Social Musician on phone: Not on file    Gets together:  Not on file    Attends religious service: Not on file    Active member of club or organization: Not on file    Attends meetings of clubs or organizations: Not on file    Relationship status: Not on file  . Intimate partner violence    Fear of current or ex partner: Not on file    Emotionally abused: Not on file    Physically abused: Not on file    Forced sexual activity: Not on file  Other Topics Concern  . Not on file  Social History Narrative  . Not on file   Family History  Problem Relation Age of Onset  . Cancer Mother   . High blood pressure Father   . Emphysema Brother       VITAL SIGNS BP (!) 104/49   Pulse 77   Temp 98.4 F (36.9 C)   Resp 20   Ht 5\' 3"  (1.6 m)   Wt 134 lb 3.2 oz (60.9 kg)   BMI 23.77 kg/m   Outpatient Encounter Medications as of 03/27/2019  Medication Sig  .  acetaminophen (TYLENOL) 325 MG tablet Take 1-2 tablets (325-650 mg total) by mouth every 4 (four) hours as needed for mild pain.  . calcium-vitamin D (OSCAL WITH D) 500-200 MG-UNIT per tablet Take 1 tablet by mouth 2 (two) times daily.   Marland Kitchen Dextran 70-Hypromellose (ARTIFICIAL TEARS) 0.1-0.3 % SOLN Place 1 drop into both eyes 3 (three) times daily as needed.  . diclofenac sodium (VOLTAREN) 1 % GEL Apply 2 g topically 4 (four) times daily.  . fludrocortisone (FLORINEF) 0.1 MG tablet Take 1 tablet (0.1 mg total) by mouth daily.  Marland Kitchen gabapentin (NEURONTIN) 300 MG capsule Take 1 capsule (300 mg total) by mouth 3 (three) times daily.  . metoprolol succinate (TOPROL-XL) 25 MG 24 hr tablet Take 12.5 mg by mouth daily.  . NON FORMULARY Diet Type: Regular  . Nutritional Supplements (ENSURE ENLIVE PO) Take 1 Bottle by mouth daily. Prefers Vanilla  . oxyCODONE (ROXICODONE) 5 MG immediate release tablet Take 1 tablet (5 mg total) by mouth every 8 (eight) hours as needed for up to 7 days for severe pain.  . Oxycodone HCl 10 MG TABS Take 10 mg by mouth daily. Give every morning prior to therapy  . pantoprazole (PROTONIX) 40 MG tablet Take 1 tablet (40 mg total) by mouth 2 (two) times daily.  . polyethylene glycol (MIRALAX / GLYCOLAX) 17 g packet Take 17 g by mouth daily.  . rosuvastatin (CRESTOR) 5 MG tablet Take 5 mg by mouth daily.  . sennosides-docusate sodium (SENOKOT-S) 8.6-50 MG tablet Take 2 tablets by mouth 2 (two) times daily as needed for constipation.  . traZODone (DESYREL) 50 MG tablet Take 25 mg by mouth at bedtime.   No facility-administered encounter medications on file as of 03/27/2019.      SIGNIFICANT DIAGNOSTIC EXAMS   PREVIOUS;  02-10-19: ct of cervical spine:  1. Status post posterior occipitocervical fusion to the level of C3-C4, there is improved alignment of a chronic fracture of the dens, with approximately 5 mm anterolisthesis of C1 on C2. 2. Left approach right-sided C2 laminar  screw intrudes into the posterior spinal canal and right lateral recess (series 8, image 32). Correlate for referable radiculopathy symptoms. 3. Moderate multilevel disc degenerative disease of the cervical spine from C3 through C7, with ankylosis of C3-C4.  02-12-19: MRI: of cervical spine:  Apparent interval removal the right laminar screw at C2 which appeared  to intrude upon the canal. Continued 5 mm anterolisthesis of C1 and the nonunited dens fragment relative to the body of C2. Continued canal stenosis with AP diameter of the canal as narrow as 3.4 mm, flattening of the cord and abnormal T2 cord signal. In this patient has had interval laminectomy at C1, it is difficult to explain how the AP diameter of the canal actually measures smaller than on the pre-surgical exam. This could relate to postoperative edema the posterior tissues. Lesser but potentially significant stenosis from C2-3 through C7-T1 as outlined above.  02-17-19: bilateral lower extremity doppler: Right: There is no evidence of deep vein thrombosis in the lower extremity. A cystic structure is found in the popliteal fossa. Left: There is no evidence of deep vein thrombosis in the lower extremity. No cystic structure found in the popliteal fossa.  NO NEW EXAMS.   LABS REVIEWED PREVIOUS:   02-07-19: wbc 10.9; hgb 11.6; hct 34.8; mcv 90.9; plt 172 glucose 149; bun 17; creat 0.68; k+ 4.3; na++ 135; ca 8.8  02-16-19: wbc 8.2; hgb 11.1; hct 33.4; mcv 91.0 plt 267 glucose 215; bun 14; creat 0.54; k+ 3.6; na++ 134; ca 8.6 liver normal albumin 2.7 urine culture: no growth  03-03-19: wbc 5.1; hgb 10.1; hct 30.9; mcv 92.8; plt 184; glucose 98; bun 15; create 0.59; k+ 3.7; na++ 137; ca 9.1  03-10-19: wbc 5.1; hgb 11.1; hct 35.7; mcv 93.7;plt 229; glucose 100; bun 16; creat 0.86; k+ 4.2; na++ 137; ca 9.2   NO NEW LABS.    Review of Systems  Constitutional: Negative for malaise/fatigue.  Respiratory: Negative for cough and shortness of  breath.   Cardiovascular: Negative for chest pain, palpitations and leg swelling.  Gastrointestinal: Negative for abdominal pain, constipation and heartburn.  Musculoskeletal: Negative for back pain, joint pain and myalgias.  Skin: Negative.   Neurological: Negative for dizziness.       Bilateral hand numbness  Psychiatric/Behavioral: The patient is not nervous/anxious.      Physical Exam Constitutional:      General: She is not in acute distress.    Appearance: She is well-developed. She is not diaphoretic.     Comments: thin  Neck:     Thyroid: No thyromegaly.  Cardiovascular:     Rate and Rhythm: Normal rate and regular rhythm.     Heart sounds: Normal heart sounds.  Pulmonary:     Effort: Pulmonary effort is normal. No respiratory distress.     Breath sounds: Normal breath sounds.  Abdominal:     General: Bowel sounds are normal. There is no distension.     Palpations: Abdomen is soft.     Tenderness: There is no abdominal tenderness.  Musculoskeletal:     Right lower leg: No edema.     Left lower leg: No edema.     Comments: Cervical collar in place Is able to move all extremities  Wearing bilateral lower extremity ace wraps and abdominal binder     Lymphadenopathy:     Cervical: No cervical adenopathy.  Skin:    General: Skin is warm and dry.  Neurological:     Mental Status: She is alert and oriented to person, place, and time.  Psychiatric:        Mood and Affect: Mood normal.     ASSESSMENT/ PLAN:   TODAY:   1. Stenosis of cervical spine with myelopathy/central cord syndrome sequela: is stable will continue therapy as directed; will continue neurontin 300 mg three times daily  volaren gel 2 gm four times daily and oxycodone 10 mg prior to therapy and 5 mg every 8 hours as needed. Is to continue to wear c-collar.   2. Supraventricular tachycardia: is stable will continue toprol xl 12.5 mg daily   3. Orthostatic hypotension: is stable b/p 104/49: will  continue florinef 0.1 mg daily will monitor   PREVIOUS  4. GERD without esophagitis: is stable will continue protonix 40 mg twice daily   5. Chronic constipation: is stable will continue senna s 2 tabs twice daily as needed and miralax daily   6. Dyslipidemia: is stable will continue crestor 5 mg daily   7. Insomnia; is stable will continue trazodone 25 mg nightly      MD is aware of resident's narcotic use and is in agreement with current plan of care. We will attempt to wean resident as apropriate   Synthia Innocent NP [facility]  Contact [phone] Monday through Friday 8am- 5pm  After hours call [phone]

## 2019-03-31 ENCOUNTER — Other Ambulatory Visit: Payer: Self-pay | Admitting: Adult Health

## 2019-03-31 MED ORDER — OXYCODONE HCL 10 MG PO TABS: 10.0000 mg | ORAL_TABLET | Freq: Every day | ORAL | 0 refills | Status: DC

## 2019-04-01 ENCOUNTER — Other Ambulatory Visit: Payer: Self-pay | Admitting: Adult Health

## 2019-04-01 ENCOUNTER — Encounter: Payer: Self-pay | Admitting: Adult Health

## 2019-04-01 ENCOUNTER — Non-Acute Institutional Stay (SKILLED_NURSING_FACILITY): Payer: Medicare Other | Admitting: Adult Health

## 2019-04-01 DIAGNOSIS — G992 Myelopathy in diseases classified elsewhere: Secondary | ICD-10-CM

## 2019-04-01 DIAGNOSIS — M4802 Spinal stenosis, cervical region: Secondary | ICD-10-CM | POA: Diagnosis not present

## 2019-04-01 DIAGNOSIS — S14129S Central cord syndrome at unspecified level of cervical spinal cord, sequela: Secondary | ICD-10-CM | POA: Diagnosis not present

## 2019-04-01 MED ORDER — OXYCODONE HCL 5 MG PO TABS: 5.0000 mg | ORAL_TABLET | Freq: Every day | ORAL | 0 refills | Status: DC

## 2019-04-01 NOTE — Progress Notes (Signed)
Location:   [facility] Room Number: 128 P Place of Service:  SNF (31)   CODE STATUS: DNR  Allergies  Allergen Reactions   Penicillins Hives, Swelling, Rash and Other (See Comments)    Did it involve swelling of the face/tongue/throat, SOB, or low BP? #  #  #  YES  #  #  #  Did it involve sudden or severe rash/hives, skin peeling, or any reaction on the inside of your mouth or nose? No Did you need to seek medical attention at a hospital or doctor's office? #  #  #  YES  #  #  #  When did it last happen?85 years old.    Codeine Phosphate [Codeine] Other (See Comments)    headache    Chief Complaint  Patient presents with   Acute Visit    Pain Management    HPI:  She been hospitalized for cervical stenosis; she is status post cervical laminectomy . She presently has ordered oxycodone 10 mg prior to therapy and has oxycodone 5 mg every 4 hours as needed for pain. She tells me that her pain is managed. Her daughter died suddenly last night; she is tearful.   Past Medical History:  Diagnosis Date   Allergic rhinitis    Anosmia    Arthritis    Full dentures    History of diverticulitis    Insomnia    Insomnia    Senile osteoporosis    SOB (shortness of breath)    SVT (supraventricular tachycardia) (HCC)    Varicose vein of leg    left   Wears glasses     Past Surgical History:  Procedure Laterality Date   COLON RESECTION  1989   diverticulitis   COLONOSCOPY     ESOPHAGEAL DILATION  2015   endoscopy   POSTERIOR CERVICAL FUSION/FORAMINOTOMY N/A 02/11/2019   Procedure: POSTERIOR CERVICAL INSTRUMENTATION REMOVAL CERVICAL TWO;  Surgeon: Lisbeth Renshaw, MD;  Location: [facility] OR;  Service: Neurosurgery;  Laterality: N/A;   POSTERIOR CERVICAL LAMINECTOMY N/A 02/06/2019   Procedure: Cervical one LAMINECTOMY, Occiput-Cervical four fusion;  Surgeon: Lisbeth Renshaw, MD;  Location: [facility] OR;  Service: Neurosurgery;  Laterality:  N/A;    Social History   Socioeconomic History   Marital status: Widowed    Spouse name: Not on file   Number of children: Not on file   Years of education: Not on file   Highest education level: Not on file  Occupational History   Not on file  Social Needs   Financial resource strain: Not on file   Food insecurity    Worry: Not on file    Inability: Not on file   Transportation needs    Medical: Not on file    Non-medical: Not on file  Tobacco Use   Smoking status: Never Smoker   Smokeless tobacco: Never Used  Substance and Sexual Activity   Alcohol use: Yes    Comment: rare   Drug use: No   Sexual activity: Not on file  Lifestyle   Physical activity    Days per week: Not on file    Minutes per session: Not on file   Stress: Not on file  Relationships   Social connections    Talks on phone: Not on file    Gets together: Not on file    Attends religious service: Not on file    Active member of club or organization: Not on file  Attends meetings of clubs or organizations: Not on file    Relationship status: Not on file   Intimate partner violence    Fear of current or ex partner: Not on file    Emotionally abused: Not on file    Physically abused: Not on file    Forced sexual activity: Not on file  Other Topics Concern   Not on file  Social History Narrative   Not on file   Family History  Problem Relation Age of Onset   Cancer Mother    High blood pressure Father    Emphysema Brother       VITAL SIGNS BP (!) 127/56    Pulse 69    Temp 98 F (36.7 C)    Resp 20    Ht 5\' 3"  (1.6 m)    Wt 134 lb 3.2 oz (60.9 kg)    BMI 23.77 kg/m   Outpatient Encounter Medications as of 04/01/2019  Medication Sig   acetaminophen (TYLENOL) 325 MG tablet Take 1-2 tablets (325-650 mg total) by mouth every 4 (four) hours as needed for mild pain.   calcium-vitamin D (OSCAL WITH D) 500-200 MG-UNIT per tablet Take 1 tablet by mouth 2 (two) times  daily.    Dextran 70-Hypromellose (ARTIFICIAL TEARS) 0.1-0.3 % SOLN Place 1 drop into both eyes 3 (three) times daily as needed.   diclofenac sodium (VOLTAREN) 1 % GEL Apply 2 g topically 4 (four) times daily.   fludrocortisone (FLORINEF) 0.1 MG tablet Take 1 tablet (0.1 mg total) by mouth daily.   gabapentin (NEURONTIN) 300 MG capsule Take 1 capsule (300 mg total) by mouth 3 (three) times daily.   metoprolol succinate (TOPROL-XL) 25 MG 24 hr tablet Take 12.5 mg by mouth daily.   NON FORMULARY Diet Type: Regular   Nutritional Supplements (ENSURE ENLIVE PO) Take 1 Bottle by mouth daily. Prefers Vanilla   oxyCODONE (ROXICODONE) 5 MG immediate release tablet Take 1 tablet (5 mg total) by mouth every 8 (eight) hours as needed for up to 7 days for severe pain.   Oxycodone HCl 10 MG TABS Take 1 tablet (10 mg total) by mouth daily. Give every morning prior to therapy   pantoprazole (PROTONIX) 40 MG tablet Take 1 tablet (40 mg total) by mouth 2 (two) times daily.   polyethylene glycol (MIRALAX / GLYCOLAX) 17 g packet Take 17 g by mouth daily.   rosuvastatin (CRESTOR) 5 MG tablet Take 5 mg by mouth daily.   sennosides-docusate sodium (SENOKOT-S) 8.6-50 MG tablet Take 2 tablets by mouth 2 (two) times daily as needed for constipation.   traZODone (DESYREL) 50 MG tablet Take 25 mg by mouth at bedtime.   No facility-administered encounter medications on file as of 04/01/2019.      SIGNIFICANT DIAGNOSTIC EXAMS  PREVIOUS;  02-10-19: ct of cervical spine:  1. Status post posterior occipitocervical fusion to the level of C3-C4, there is improved alignment of a chronic fracture of the dens, with approximately 5 mm anterolisthesis of C1 on C2. 2. Left approach right-sided C2 laminar screw intrudes into the posterior spinal canal and right lateral recess (series 8, image 32). Correlate for referable radiculopathy symptoms. 3. Moderate multilevel disc degenerative disease of the cervical spine  from C3 through C7, with ankylosis of C3-C4.  02-12-19: MRI: of cervical spine:  Apparent interval removal the right laminar screw at C2 which appeared to intrude upon the canal. Continued 5 mm anterolisthesis of C1 and the nonunited dens fragment relative to the  body of C2. Continued canal stenosis with AP diameter of the canal as narrow as 3.4 mm, flattening of the cord and abnormal T2 cord signal. In this patient has had interval laminectomy at C1, it is difficult to explain how the AP diameter of the canal actually measures smaller than on the pre-surgical exam. This could relate to postoperative edema the posterior tissues. Lesser but potentially significant stenosis from C2-3 through C7-T1 as outlined above.  02-17-19: bilateral lower extremity doppler: Right: There is no evidence of deep vein thrombosis in the lower extremity. A cystic structure is found in the popliteal fossa. Left: There is no evidence of deep vein thrombosis in the lower extremity. No cystic structure found in the popliteal fossa.  NO NEW EXAMS.   LABS REVIEWED PREVIOUS:   02-07-19: wbc 10.9; hgb 11.6; hct 34.8; mcv 90.9; plt 172 glucose 149; bun 17; creat 0.68; k+ 4.3; na++ 135; ca 8.8  02-16-19: wbc 8.2; hgb 11.1; hct 33.4; mcv 91.0 plt 267 glucose 215; bun 14; creat 0.54; k+ 3.6; na++ 134; ca 8.6 liver normal albumin 2.7 urine culture: no growth  03-03-19: wbc 5.1; hgb 10.1; hct 30.9; mcv 92.8; plt 184; glucose 98; bun 15; create 0.59; k+ 3.7; na++ 137; ca 9.1  03-10-19: wbc 5.1; hgb 11.1; hct 35.7; mcv 93.7;plt 229; glucose 100; bun 16; creat 0.86; k+ 4.2; na++ 137; ca 9.2   NO NEW LABS.   Review of Systems  Constitutional: Negative for malaise/fatigue.  Respiratory: Negative for cough and shortness of breath.   Cardiovascular: Negative for chest pain, palpitations and leg swelling.  Gastrointestinal: Negative for abdominal pain, constipation and heartburn.  Musculoskeletal: Negative for back pain, joint pain and  myalgias.  Skin: Negative.   Neurological: Negative for dizziness.       Bilateral hand numbness   Psychiatric/Behavioral: The patient is not nervous/anxious.      Physical Exam Constitutional:      General: She is not in acute distress.    Appearance: She is well-developed. She is not diaphoretic.     Comments: thin  Neck:     Thyroid: No thyromegaly.  Cardiovascular:     Rate and Rhythm: Normal rate and regular rhythm.     Pulses: Normal pulses.     Heart sounds: Normal heart sounds.  Pulmonary:     Effort: Pulmonary effort is normal. No respiratory distress.     Breath sounds: Normal breath sounds.  Abdominal:     General: Bowel sounds are normal. There is no distension.     Palpations: Abdomen is soft.     Tenderness: There is no abdominal tenderness.  Musculoskeletal:     Right lower leg: No edema.     Left lower leg: No edema.     Comments: Cervical collar in place Is able to move all extremities  Has bilateral hand numbness Wearing bilateral lower extremity ace wraps and abdominal binder   Lymphadenopathy:     Cervical: No cervical adenopathy.  Skin:    General: Skin is warm and dry.  Neurological:     Mental Status: She is alert and oriented to person, place, and time.  Psychiatric:        Mood and Affect: Mood normal.     ASSESSMENT/ PLAN:  TODAY:   1. 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     MD is aware of resident's narcotic use and is in agreement with current plan of care. We will attempt to wean resident as apropriate   Ok Edwards NP [facility]  Contact [phone] Monday through Friday 8am- 5pm  After hours call [phone]

## 2019-04-03 ENCOUNTER — Encounter: Payer: Self-pay | Admitting: Adult Health

## 2019-04-03 ENCOUNTER — Non-Acute Institutional Stay (SKILLED_NURSING_FACILITY): Payer: Medicare Other | Admitting: Adult Health

## 2019-04-03 DIAGNOSIS — E785 Hyperlipidemia, unspecified: Secondary | ICD-10-CM

## 2019-04-03 DIAGNOSIS — K219 Gastro-esophageal reflux disease without esophagitis: Secondary | ICD-10-CM | POA: Diagnosis not present

## 2019-04-03 DIAGNOSIS — K5909 Other constipation: Secondary | ICD-10-CM | POA: Diagnosis not present

## 2019-04-03 NOTE — Progress Notes (Signed)
Location:   [place] Room Number: 128 P Place of Service:  SNF (31)   CODE STATUS: DNR  Allergies  Allergen Reactions  . Penicillins Hives, Swelling, Rash and Other (See Comments)    Did it involve swelling of the face/tongue/throat, SOB, or low BP? #  #  #  YES  #  #  #  Did it involve sudden or severe rash/hives, skin peeling, or any reaction on the inside of your mouth or nose? No Did you need to seek medical attention at a hospital or doctor's office? #  #  #  YES  #  #  #  When did it last happen?85 years old.   . Codeine Phosphate [Codeine] Other (See Comments)    headache    Chief Complaint  Patient presents with  . Medical Management of Chronic Issues      GERD without esophagitis:Chronic constipation:Dyslipidemia: weekly follow up for the first 30 days post hospitalization.     HPI:  She is a 85 year old short term rehab patient being seen for the management of her chronic illnesses; gerd; constipation; dyslipidemia. She denies any problems with heart burn or constipation. She denies any uncontrolled pain. She continues to participate in therapy. Her remains to return back home.   Past Medical History:  Diagnosis Date  . Allergic rhinitis   . Anosmia   . Arthritis   . Full dentures   . History of diverticulitis   . Insomnia   . Insomnia   . Senile osteoporosis   . SOB (shortness of breath)   . SVT (supraventricular tachycardia) ([place])   . Varicose vein of leg    left  . Wears glasses     Past Surgical History:  Procedure Laterality Date  . COLON RESECTION  1989   diverticulitis  . COLONOSCOPY    . ESOPHAGEAL DILATION  2015   endoscopy  . POSTERIOR CERVICAL FUSION/FORAMINOTOMY N/A 02/11/2019   Procedure: POSTERIOR CERVICAL INSTRUMENTATION REMOVAL CERVICAL TWO;  Surgeon: Consuella Lose, MD;  Location: [place];  Service: Neurosurgery;  Laterality: N/A;  . POSTERIOR CERVICAL LAMINECTOMY N/A 02/06/2019   Procedure: Cervical  one LAMINECTOMY, Occiput-Cervical four fusion;  Surgeon: Consuella Lose, MD;  Location: [place];  Service: Neurosurgery;  Laterality: N/A;    Social History   Socioeconomic History  . Marital status: Widowed    Spouse name: Not on file  . Number of children: Not on file  . Years of education: Not on file  . Highest education level: Not on file  Occupational History  . Not on file  Social Needs  . Financial resource strain: Not on file  . Food insecurity    Worry: Not on file    Inability: Not on file  . Transportation needs    Medical: Not on file    Non-medical: Not on file  Tobacco Use  . Smoking status: Never Smoker  . Smokeless tobacco: Never Used  Substance and Sexual Activity  . Alcohol use: Yes    Comment: rare  . Drug use: No  . Sexual activity: Not on file  Lifestyle  . Physical activity    Days per week: Not on file    Minutes per session: Not on file  . Stress: Not on file  Relationships  . Social Herbalist on phone: Not on file    Gets together: Not on file    Attends religious service: Not on file  Active member of club or organization: Not on file    Attends meetings of clubs or organizations: Not on file    Relationship status: Not on file  . Intimate partner violence    Fear of current or ex partner: Not on file    Emotionally abused: Not on file    Physically abused: Not on file    Forced sexual activity: Not on file  Other Topics Concern  . Not on file  Social History Narrative  . Not on file   Family History  Problem Relation Age of Onset  . Cancer Mother   . High blood pressure Father   . Emphysema Brother       VITAL SIGNS BP 114/68   Pulse 73   Temp (!) 97.4 F (36.3 C)   Resp 20   Ht 5\' 3"  (1.6 m)   Wt 137 lb 12.8 oz (62.5 kg)   BMI 24.41 kg/m   Outpatient Encounter Medications as of 04/03/2019  Medication Sig  . acetaminophen (TYLENOL) 325 MG tablet Take 1-2 tablets (325-650 mg total) by mouth every 4  (four) hours as needed for mild pain.  . calcium-vitamin D (OSCAL WITH D) 500-200 MG-UNIT per tablet Take 1 tablet by mouth 2 (two) times daily.   Marland Kitchen. Dextran 70-Hypromellose (ARTIFICIAL TEARS) 0.1-0.3 % SOLN Place 1 drop into both eyes 3 (three) times daily as needed.  . diclofenac sodium (VOLTAREN) 1 % GEL Apply 2 g topically 4 (four) times daily.  . fludrocortisone (FLORINEF) 0.1 MG tablet Take 1 tablet (0.1 mg total) by mouth daily.  Marland Kitchen. gabapentin (NEURONTIN) 300 MG capsule Take 1 capsule (300 mg total) by mouth 3 (three) times daily.  . metoprolol succinate (TOPROL-XL) 25 MG 24 hr tablet Take 12.5 mg by mouth daily.  . NON FORMULARY Diet Type: Regular  . Nutritional Supplements (ENSURE ENLIVE PO) Take 1 Bottle by mouth daily. Prefers Vanilla  . oxyCODONE (ROXICODONE) 5 MG immediate release tablet Take 1 tablet (5 mg total) by mouth daily.  . pantoprazole (PROTONIX) 40 MG tablet Take 1 tablet (40 mg total) by mouth 2 (two) times daily.  . polyethylene glycol (MIRALAX / GLYCOLAX) 17 g packet Take 17 g by mouth daily.  . rosuvastatin (CRESTOR) 5 MG tablet Take 5 mg by mouth daily.  . sennosides-docusate sodium (SENOKOT-S) 8.6-50 MG tablet Take 2 tablets by mouth 2 (two) times daily as needed for constipation.  . traZODone (DESYREL) 50 MG tablet Take 25 mg by mouth at bedtime.   No facility-administered encounter medications on file as of 04/03/2019.      SIGNIFICANT DIAGNOSTIC EXAMS   PREVIOUS;  02-10-19: ct of cervical spine:  1. Status post posterior occipitocervical fusion to the level of C3-C4, there is improved alignment of a chronic fracture of the dens, with approximately 5 mm anterolisthesis of C1 on C2. 2. Left approach right-sided C2 laminar screw intrudes into the posterior spinal canal and right lateral recess (series 8, image 32). Correlate for referable radiculopathy symptoms. 3. Moderate multilevel disc degenerative disease of the cervical spine from C3 through C7, with  ankylosis of C3-C4.  02-12-19: MRI: of cervical spine:  Apparent interval removal the right laminar screw at C2 which appeared to intrude upon the canal. Continued 5 mm anterolisthesis of C1 and the nonunited dens fragment relative to the body of C2. Continued canal stenosis with AP diameter of the canal as narrow as 3.4 mm, flattening of the cord and abnormal T2 cord signal. In this  patient has had interval laminectomy at C1, it is difficult to explain how the AP diameter of the canal actually measures smaller than on the pre-surgical exam. This could relate to postoperative edema the posterior tissues. Lesser but potentially significant stenosis from C2-3 through C7-T1 as outlined above.  02-17-19: bilateral lower extremity doppler: Right: There is no evidence of deep vein thrombosis in the lower extremity. A cystic structure is found in the popliteal fossa. Left: There is no evidence of deep vein thrombosis in the lower extremity. No cystic structure found in the popliteal fossa.  NO NEW EXAMS.   LABS REVIEWED PREVIOUS:   02-07-19: wbc 10.9; hgb 11.6; hct 34.8; mcv 90.9; plt 172 glucose 149; bun 17; creat 0.68; k+ 4.3; na++ 135; ca 8.8  02-16-19: wbc 8.2; hgb 11.1; hct 33.4; mcv 91.0 plt 267 glucose 215; bun 14; creat 0.54; k+ 3.6; na++ 134; ca 8.6 liver normal albumin 2.7 urine culture: no growth  03-03-19: wbc 5.1; hgb 10.1; hct 30.9; mcv 92.8; plt 184; glucose 98; bun 15; create 0.59; k+ 3.7; na++ 137; ca 9.1  03-10-19: wbc 5.1; hgb 11.1; hct 35.7; mcv 93.7;plt 229; glucose 100; bun 16; creat 0.86; k+ 4.2; na++ 137; ca 9.2   NO NEW LABS.   Review of Systems  Constitutional: Negative for malaise/fatigue.  Respiratory: Negative for cough and shortness of breath.   Cardiovascular: Negative for chest pain, palpitations and leg swelling.  Gastrointestinal: Negative for abdominal pain, constipation and heartburn.  Musculoskeletal: Negative for back pain, joint pain and myalgias.  Skin: Negative.    Neurological: Negative for dizziness.       Bilateral hand numbness   Psychiatric/Behavioral: The patient is not nervous/anxious.     Physical Exam Constitutional:      General: She is not in acute distress.    Appearance: She is well-developed. She is not diaphoretic.     Comments: thin  Neck:     Musculoskeletal: Neck supple.     Thyroid: No thyromegaly.  Cardiovascular:     Rate and Rhythm: Normal rate and regular rhythm.     Pulses: Normal pulses.     Heart sounds: Normal heart sounds.  Pulmonary:     Effort: Pulmonary effort is normal. No respiratory distress.     Breath sounds: Normal breath sounds.  Abdominal:     General: Bowel sounds are normal. There is no distension.     Palpations: Abdomen is soft.     Tenderness: There is no abdominal tenderness.  Musculoskeletal:     Right lower leg: No edema.     Left lower leg: No edema.     Comments: Cervical collar in place Is able to move all extremities  Has bilateral hand numbness Wearing bilateral lower extremity ace wraps and abdominal binder    Lymphadenopathy:     Cervical: No cervical adenopathy.  Skin:    General: Skin is warm and dry.  Neurological:     Mental Status: She is alert and oriented to person, place, and time.  Psychiatric:        Mood and Affect: Mood normal.     ASSESSMENT/ PLAN:  TODAY:   1. GERD without esophagitis: is stable will continue protonix 40 mg daily   2. Chronic constipation: is stable will continue miralax daily and senna s 2 tabs twice daily as needed  3. Dyslipidemia: is stable will continue crestor 5 mg daily    PREVIOUS  4. Insomnia; is stable will continue trazodone 25 mg  nightly   5. 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 Is wearing c-collar  6.  Supraventricular tachycardia: is stable will continue toprol xl 12.5 mg daily   7. Orthostatic hypotension: is stable b/p 114/68: will continue florinef 0.1 mg daily will monitor   MD is aware of resident's narcotic use and is in agreement with current plan of care. We will attempt to wean resident as apropriate   Synthia Innocent NP [facility]  Contact [phone] Monday through Friday 8am- 5pm  After hours call [phone]

## 2019-04-08 ENCOUNTER — Ambulatory Visit (HOSPITAL_COMMUNITY)
Admit: 2019-04-08 | Discharge: 2019-04-08 | Disposition: A | Discharge status: Home / Self Care | Payer: Medicare Other | Attending: Internal Medicine | Admitting: Internal Medicine

## 2019-04-10 ENCOUNTER — Non-Acute Institutional Stay (SKILLED_NURSING_FACILITY): Payer: Medicare Other | Admitting: Adult Health

## 2019-04-10 ENCOUNTER — Encounter: Payer: Self-pay | Admitting: Adult Health

## 2019-04-10 DIAGNOSIS — I471 Supraventricular tachycardia: Secondary | ICD-10-CM | POA: Diagnosis not present

## 2019-04-10 DIAGNOSIS — S14129S Central cord syndrome at unspecified level of cervical spinal cord, sequela: Secondary | ICD-10-CM | POA: Diagnosis not present

## 2019-04-10 DIAGNOSIS — M4802 Spinal stenosis, cervical region: Secondary | ICD-10-CM

## 2019-04-10 DIAGNOSIS — G4700 Insomnia, unspecified: Secondary | ICD-10-CM

## 2019-04-10 DIAGNOSIS — G992 Myelopathy in diseases classified elsewhere: Secondary | ICD-10-CM

## 2019-04-10 NOTE — Progress Notes (Signed)
Location:    [facility] Room Number: 128/P Place of Service:  SNF (31)   CODE STATUS: DNR  Allergies  Allergen Reactions  . Penicillins Hives, Swelling, Rash and Other (See Comments)    Did it involve swelling of the face/tongue/throat, SOB, or low BP? #  #  #  YES  #  #  #  Did it involve sudden or severe rash/hives, skin peeling, or any reaction on the inside of your mouth or nose? No Did you need to seek medical attention at a hospital or doctor's office? #  #  #  YES  #  #  #  When did it last happen?85 years old.   . Codeine Phosphate [Codeine] Other (See Comments)    headache    Chief Complaint  Patient presents with  . 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     HPI:  She is a 85 year old short term rehab patient being seen for the management of her chronic illnesses: insomnia; stenosis of cervical spine; central cord syndrome; ventricular tachycardia. We have had a prolonged discussion about her use of oxycodone prior to therapy. She would like for this to continue. I did tell her that this medication will be stopped prior to her discharge; she has agreed to this plan of care. She denies any insomnia; no palpitations. No reports of fevers present.   Past Medical History:  Diagnosis Date  . Allergic rhinitis   . Anosmia   . Arthritis   . Full dentures   . History of diverticulitis   . Insomnia   . Insomnia   . Senile osteoporosis   . SOB (shortness of breath)   . SVT (supraventricular tachycardia) (HCC)   . Varicose vein of leg    left  . Wears glasses     Past Surgical History:  Procedure Laterality Date  . COLON RESECTION  1989   diverticulitis  . COLONOSCOPY    . ESOPHAGEAL DILATION  2015   endoscopy  . POSTERIOR CERVICAL FUSION/FORAMINOTOMY N/A  02/11/2019   Procedure: POSTERIOR CERVICAL INSTRUMENTATION REMOVAL CERVICAL TWO;  Surgeon: Lisbeth Renshaw, MD;  Location: [facility] OR;  Service: Neurosurgery;  Laterality: N/A;  . POSTERIOR CERVICAL LAMINECTOMY N/A 02/06/2019   Procedure: Cervical one LAMINECTOMY, Occiput-Cervical four fusion;  Surgeon: Lisbeth Renshaw, MD;  Location: [facility] OR;  Service: Neurosurgery;  Laterality: N/A;    Social History   Socioeconomic History  . Marital status: Widowed    Spouse name: Not on file  . Number of children: Not on file  . Years of education: Not on file  . Highest education level: Not on file  Occupational History  . Not on file  Social Needs  . Financial resource strain: Not on file  . Food insecurity    Worry: Not on file    Inability: Not on file  . Transportation needs    Medical: Not on file    Non-medical: Not on file  Tobacco Use  . Smoking status: Never Smoker  . Smokeless tobacco: Never Used  Substance and Sexual Activity  . Alcohol use: Yes    Comment: rare  . Drug use: No  . Sexual activity: Not on file  Lifestyle  . Physical activity    Days per week: Not on file  Minutes per session: Not on file  . Stress: Not on file  Relationships  . Social Musicianconnections    Talks on phone: Not on file    Gets together: Not on file    Attends religious service: Not on file    Active member of club or organization: Not on file    Attends meetings of clubs or organizations: Not on file    Relationship status: Not on file  . Intimate partner violence    Fear of current or ex partner: Not on file    Emotionally abused: Not on file    Physically abused: Not on file    Forced sexual activity: Not on file  Other Topics Concern  . Not on file  Social History Narrative  . Not on file   Family History  Problem Relation Age of Onset  . Cancer Mother   . High blood pressure Father   . Emphysema Brother       VITAL SIGNS BP 113/60   Pulse 64   Temp (!) 97.5 F (36.4 C)  (Oral)   Resp 20   Ht 5\' 3"  (1.6 m)   Wt 138 lb 12.8 oz (63 kg)   BMI 24.59 kg/m   Outpatient Encounter Medications as of 04/10/2019  Medication Sig  . acetaminophen (TYLENOL) 325 MG tablet Take 1-2 tablets (325-650 mg total) by mouth every 4 (four) hours as needed for mild pain.  . calcium-vitamin D (OSCAL WITH D) 500-200 MG-UNIT per tablet Take 1 tablet by mouth 2 (two) times daily.   Marland Kitchen. Dextran 70-Hypromellose (ARTIFICIAL TEARS) 0.1-0.3 % SOLN Place 1 drop into both eyes 3 (three) times daily as needed.  . diclofenac sodium (VOLTAREN) 1 % GEL Apply 2 g topically 4 (four) times daily.  . fludrocortisone (FLORINEF) 0.1 MG tablet Take 1 tablet (0.1 mg total) by mouth daily.  Marland Kitchen. gabapentin (NEURONTIN) 300 MG capsule Take 1 capsule (300 mg total) by mouth 3 (three) times daily.  . metoprolol succinate (TOPROL-XL) 25 MG 24 hr tablet Take 12.5 mg by mouth daily.  . NON FORMULARY Diet Type: Regular  . Nutritional Supplements (ENSURE ENLIVE PO) Take 1 Bottle by mouth daily. Prefers Vanilla  . oxyCODONE (ROXICODONE) 5 MG immediate release tablet Take 1 tablet (5 mg total) by mouth daily.  . pantoprazole (PROTONIX) 40 MG tablet Take 1 tablet (40 mg total) by mouth 2 (two) times daily.  . polyethylene glycol (MIRALAX / GLYCOLAX) 17 g packet Take 17 g by mouth daily.  . rosuvastatin (CRESTOR) 5 MG tablet Take 5 mg by mouth daily.  . sennosides-docusate sodium (SENOKOT-S) 8.6-50 MG tablet Take 2 tablets by mouth 2 (two) times daily as needed for constipation.  . traZODone (DESYREL) 50 MG tablet Take 25 mg by mouth at bedtime.   No facility-administered encounter medications on file as of 04/10/2019.      SIGNIFICANT DIAGNOSTIC EXAMS   PREVIOUS;  02-10-19: ct of cervical spine:  1. Status post posterior occipitocervical fusion to the level of C3-C4, there is improved alignment of a chronic fracture of the dens, with approximately 5 mm anterolisthesis of C1 on C2. 2. Left approach right-sided C2  laminar screw intrudes into the posterior spinal canal and right lateral recess (series 8, image 32). Correlate for referable radiculopathy symptoms. 3. Moderate multilevel disc degenerative disease of the cervical spine from C3 through C7, with ankylosis of C3-C4.  02-12-19: MRI: of cervical spine:  Apparent interval removal the right laminar screw at C2 which  appeared to intrude upon the canal. Continued 5 mm anterolisthesis of C1 and the nonunited dens fragment relative to the body of C2. Continued canal stenosis with AP diameter of the canal as narrow as 3.4 mm, flattening of the cord and abnormal T2 cord signal. In this patient has had interval laminectomy at C1, it is difficult to explain how the AP diameter of the canal actually measures smaller than on the pre-surgical exam. This could relate to postoperative edema the posterior tissues. Lesser but potentially significant stenosis from C2-3 through C7-T1 as outlined above.  02-17-19: bilateral lower extremity doppler: Right: There is no evidence of deep vein thrombosis in the lower extremity. A cystic structure is found in the popliteal fossa. Left: There is no evidence of deep vein thrombosis in the lower extremity. No cystic structure found in the popliteal fossa.  NO NEW EXAMS.   LABS REVIEWED PREVIOUS:   02-07-19: wbc 10.9; hgb 11.6; hct 34.8; mcv 90.9; plt 172 glucose 149; bun 17; creat 0.68; k+ 4.3; na++ 135; ca 8.8  02-16-19: wbc 8.2; hgb 11.1; hct 33.4; mcv 91.0 plt 267 glucose 215; bun 14; creat 0.54; k+ 3.6; na++ 134; ca 8.6 liver normal albumin 2.7 urine culture: no growth  03-03-19: wbc 5.1; hgb 10.1; hct 30.9; mcv 92.8; plt 184; glucose 98; bun 15; create 0.59; k+ 3.7; na++ 137; ca 9.1  03-10-19: wbc 5.1; hgb 11.1; hct 35.7; mcv 93.7;plt 229; glucose 100; bun 16; creat 0.86; k+ 4.2; na++ 137; ca 9.2   NO NEW LABS.    Review of Systems  Constitutional: Negative for malaise/fatigue.  Respiratory: Negative for cough and shortness  of breath.   Cardiovascular: Negative for chest pain, palpitations and leg swelling.  Gastrointestinal: Negative for abdominal pain, constipation and heartburn.  Musculoskeletal: Negative for back pain, joint pain and myalgias.       Neck pain is managed   Skin: Negative.   Neurological: Negative for dizziness.       Bilateral hand numbness   Psychiatric/Behavioral: The patient is not nervous/anxious.      Physical Exam Constitutional:      General: She is not in acute distress.    Appearance: She is well-developed. She is not diaphoretic.     Comments: thin  Neck:     Musculoskeletal: Neck supple.     Thyroid: No thyromegaly.  Cardiovascular:     Rate and Rhythm: Normal rate and regular rhythm.     Pulses: Normal pulses.     Heart sounds: Normal heart sounds.  Pulmonary:     Effort: Pulmonary effort is normal. No respiratory distress.     Breath sounds: Normal breath sounds.  Abdominal:     General: Bowel sounds are normal. There is no distension.     Palpations: Abdomen is soft.     Tenderness: There is no abdominal tenderness.  Musculoskeletal:     Right lower leg: No edema.     Left lower leg: No edema.     Comments: Cervical collar in place Is able to move all extremities  Has bilateral hand numbness Wearing  abdominal binder and support hose   Lymphadenopathy:     Cervical: No cervical adenopathy.  Skin:    General: Skin is warm and dry.  Neurological:     Mental Status: She is alert and oriented to person, place, and time.  Psychiatric:        Mood and Affect: Mood normal.      ASSESSMENT/ PLAN:  TODAY:  1. Insomnia: is stable will continue trazodone 25 mg nightly  2. 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  Supraventricular tachycardia: is stable will continue toprol xl 12.5 mg daily for rate control.    PREVIOUS  4. Orthostatic hypotension: is stable b/p 114/68: will continue florinef 0.1 mg daily will monitor   5. GERD without esophagitis: is stable will continue protonix 40 mg daily   6. Chronic constipation: is stable will continue miralax daily and senna s 2 tabs twice daily as needed  7. Dyslipidemia: is stable will continue crestor 5 mg daily       MD is aware of resident's narcotic use and is in agreement with current plan of care. We will attempt to wean resident as apropriate   Ok Edwards NP [facility]  Contact [phone] Monday through Friday 8am- 5pm  After hours call [phone]

## 2019-04-14 ENCOUNTER — Non-Acute Institutional Stay (SKILLED_NURSING_FACILITY): Payer: Medicare Other | Admitting: Adult Health

## 2019-04-14 ENCOUNTER — Encounter: Payer: Self-pay | Admitting: Adult Health

## 2019-04-14 DIAGNOSIS — S14129S Central cord syndrome at unspecified level of cervical spinal cord, sequela: Secondary | ICD-10-CM

## 2019-04-14 DIAGNOSIS — G992 Myelopathy in diseases classified elsewhere: Secondary | ICD-10-CM

## 2019-04-14 DIAGNOSIS — M4802 Spinal stenosis, cervical region: Secondary | ICD-10-CM | POA: Diagnosis not present

## 2019-04-14 NOTE — Progress Notes (Signed)
Location:   [facility] Room Number: 128 P Place of Service:  SNF (31)   CODE STATUS: DNR  Allergies  Allergen Reactions   Penicillins Hives, Swelling, Rash and Other (See Comments)    Did it involve swelling of the face/tongue/throat, SOB, or low BP? #  #  #  YES  #  #  #  Did it involve sudden or severe rash/hives, skin peeling, or any reaction on the inside of your mouth or nose? No Did you need to seek medical attention at a hospital or doctor's office? #  #  #  YES  #  #  #  When did it last happen?85 years old.    Codeine Phosphate [Codeine] Other (See Comments)    headache    Chief Complaint  Patient presents with   Acute Visit    Pain Management    HPI:  I have come to see her today for pain management. She continues to take oxycodone 5 mg daily prior to therapy. We did discuss this medication once again. She does verbalize understanding of the increased risk of dependence with prolonged use of these medications. We are going to stop this medication at this time. She is somewhat reluctant to stop this medication; but is willing to try. She denies any uncontrolled pain; she continues to have numbness in both hands. She denies any anxiety at this time.    Past Medical History:  Diagnosis Date   Allergic rhinitis    Anosmia    Arthritis    Full dentures    History of diverticulitis    Insomnia    Insomnia    Senile osteoporosis    SOB (shortness of breath)    SVT (supraventricular tachycardia) (HCC)    Varicose vein of leg    left   Wears glasses     Past Surgical History:  Procedure Laterality Date   COLON RESECTION  1989   diverticulitis   COLONOSCOPY     ESOPHAGEAL DILATION  2015   endoscopy   POSTERIOR CERVICAL FUSION/FORAMINOTOMY N/A 02/11/2019   Procedure: POSTERIOR CERVICAL INSTRUMENTATION REMOVAL CERVICAL TWO;  Surgeon: Consuella Lose, MD;  Location: [place];  Service: Neurosurgery;  Laterality:  N/A;   POSTERIOR CERVICAL LAMINECTOMY N/A 02/06/2019   Procedure: Cervical one LAMINECTOMY, Occiput-Cervical four fusion;  Surgeon: Consuella Lose, MD;  Location: [place];  Service: Neurosurgery;  Laterality: N/A;    Social History   Socioeconomic History   Marital status: Widowed    Spouse name: Not on file   Number of children: Not on file   Years of education: Not on file   Highest education level: Not on file  Occupational History   Not on file  Social Needs   Financial resource strain: Not on file   Food insecurity    Worry: Not on file    Inability: Not on file   Transportation needs    Medical: Not on file    Non-medical: Not on file  Tobacco Use   Smoking status: Never Smoker   Smokeless tobacco: Never Used  Substance and Sexual Activity   Alcohol use: Yes    Comment: rare   Drug use: No   Sexual activity: Not on file  Lifestyle   Physical activity    Days per week: Not on file    Minutes per session: Not on file   Stress: Not on file  Relationships   Social connections    Talks on  phone: Not on file    Gets together: Not on file    Attends religious service: Not on file    Active member of club or organization: Not on file    Attends meetings of clubs or organizations: Not on file    Relationship status: Not on file   Intimate partner violence    Fear of current or ex partner: Not on file    Emotionally abused: Not on file    Physically abused: Not on file    Forced sexual activity: Not on file  Other Topics Concern   Not on file  Social History Narrative   Not on file   Family History  Problem Relation Age of Onset   Cancer Mother    High blood pressure Father    Emphysema Brother       VITAL SIGNS BP 116/62    Pulse 94    Temp 98.7 F (37.1 C)    Resp 20    Ht 5\' 3"  (1.6 m)    Wt 138 lb 12.8 oz (63 kg)    BMI 24.59 kg/m   Outpatient Encounter Medications as of 04/14/2019  Medication Sig   acetaminophen (TYLENOL)  325 MG tablet Take 1-2 tablets (325-650 mg total) by mouth every 4 (four) hours as needed for mild pain.   calcium-vitamin D (OSCAL WITH D) 500-200 MG-UNIT per tablet Take 1 tablet by mouth 2 (two) times daily.    Dextran 70-Hypromellose (ARTIFICIAL TEARS) 0.1-0.3 % SOLN Place 1 drop into both eyes 3 (three) times daily as needed.   diclofenac sodium (VOLTAREN) 1 % GEL Apply 2 g topically 4 (four) times daily.   fludrocortisone (FLORINEF) 0.1 MG tablet Take 1 tablet (0.1 mg total) by mouth daily.   gabapentin (NEURONTIN) 300 MG capsule Take 1 capsule (300 mg total) by mouth 3 (three) times daily.   metoprolol succinate (TOPROL-XL) 25 MG 24 hr tablet Take 12.5 mg by mouth daily.   NON FORMULARY Diet Type: Regular   Nutritional Supplements (ENSURE ENLIVE PO) Take 1 Bottle by mouth daily. Prefers Vanilla   oxyCODONE (ROXICODONE) 5 MG immediate release tablet Take 1 tablet (5 mg total) by mouth daily.   pantoprazole (PROTONIX) 40 MG tablet Take 1 tablet (40 mg total) by mouth 2 (two) times daily.   polyethylene glycol (MIRALAX / GLYCOLAX) 17 g packet Take 17 g by mouth daily.   rosuvastatin (CRESTOR) 5 MG tablet Take 5 mg by mouth daily.   sennosides-docusate sodium (SENOKOT-S) 8.6-50 MG tablet Take 2 tablets by mouth 2 (two) times daily as needed for constipation.   traZODone (DESYREL) 50 MG tablet Take 25 mg by mouth at bedtime.   No facility-administered encounter medications on file as of 04/14/2019.      SIGNIFICANT DIAGNOSTIC EXAMS  PREVIOUS;  02-10-19: ct of cervical spine:  1. Status post posterior occipitocervical fusion to the level of C3-C4, there is improved alignment of a chronic fracture of the dens, with approximately 5 mm anterolisthesis of C1 on C2. 2. Left approach right-sided C2 laminar screw intrudes into the posterior spinal canal and right lateral recess (series 8, image 32). Correlate for referable radiculopathy symptoms. 3. Moderate multilevel disc  degenerative disease of the cervical spine from C3 through C7, with ankylosis of C3-C4.  02-12-19: MRI: of cervical spine:  Apparent interval removal the right laminar screw at C2 which appeared to intrude upon the canal. Continued 5 mm anterolisthesis of C1 and the nonunited dens fragment relative to the  body of C2. Continued canal stenosis with AP diameter of the canal as narrow as 3.4 mm, flattening of the cord and abnormal T2 cord signal. In this patient has had interval laminectomy at C1, it is difficult to explain how the AP diameter of the canal actually measures smaller than on the pre-surgical exam. This could relate to postoperative edema the posterior tissues. Lesser but potentially significant stenosis from C2-3 through C7-T1 as outlined above.  02-17-19: bilateral lower extremity doppler: Right: There is no evidence of deep vein thrombosis in the lower extremity. A cystic structure is found in the popliteal fossa. Left: There is no evidence of deep vein thrombosis in the lower extremity. No cystic structure found in the popliteal fossa.  NO NEW EXAMS.   LABS REVIEWED PREVIOUS:   02-07-19: wbc 10.9; hgb 11.6; hct 34.8; mcv 90.9; plt 172 glucose 149; bun 17; creat 0.68; k+ 4.3; na++ 135; ca 8.8  02-16-19: wbc 8.2; hgb 11.1; hct 33.4; mcv 91.0 plt 267 glucose 215; bun 14; creat 0.54; k+ 3.6; na++ 134; ca 8.6 liver normal albumin 2.7 urine culture: no growth  03-03-19: wbc 5.1; hgb 10.1; hct 30.9; mcv 92.8; plt 184; glucose 98; bun 15; create 0.59; k+ 3.7; na++ 137; ca 9.1  03-10-19: wbc 5.1; hgb 11.1; hct 35.7; mcv 93.7;plt 229; glucose 100; bun 16; creat 0.86; k+ 4.2; na++ 137; ca 9.2   NO NEW LABS.    Review of Systems  Constitutional: Negative for malaise/fatigue.  Respiratory: Negative for cough and shortness of breath.   Cardiovascular: Negative for chest pain, palpitations and leg swelling.  Gastrointestinal: Negative for abdominal pain, constipation and heartburn.    Musculoskeletal: Negative for back pain, joint pain and myalgias.  Skin: Negative.   Neurological: Negative for dizziness.       Bilateral hand numbness   Psychiatric/Behavioral: The patient is not nervous/anxious.      Physical Exam Constitutional:      General: She is not in acute distress.    Appearance: She is well-developed. She is not diaphoretic.     Comments: thin  Neck:     Musculoskeletal: Neck supple.     Thyroid: No thyromegaly.  Cardiovascular:     Rate and Rhythm: Normal rate and regular rhythm.     Pulses: Normal pulses.     Heart sounds: Normal heart sounds.  Pulmonary:     Effort: Pulmonary effort is normal. No respiratory distress.     Breath sounds: Normal breath sounds.  Abdominal:     General: Bowel sounds are normal. There is no distension.     Palpations: Abdomen is soft.     Tenderness: There is no abdominal tenderness.  Musculoskeletal:     Right lower leg: No edema.     Left lower leg: No edema.     Comments: Cervical collar in place Is able to move all extremities  Has bilateral hand numbness Wearing  abdominal binder and support hose    Lymphadenopathy:     Cervical: No cervical adenopathy.  Skin:    General: Skin is warm and dry.  Neurological:     Mental Status: She is alert and oriented to person, place, and time.  Psychiatric:        Mood and Affect: Mood normal.     ASSESSMENT/ PLAN:  TODAY:   1. 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  MD is aware of resident's narcotic use and is in agreement with current plan of care. We will attempt to wean resident as apropriate   Ok Edwards NP [facility]  Contact [phone] Monday through Friday 8am- 5pm  After hours call [phone]

## 2019-04-17 ENCOUNTER — Other Ambulatory Visit: Payer: Self-pay | Admitting: Adult Health

## 2019-04-17 ENCOUNTER — Encounter: Payer: Self-pay | Admitting: Adult Health

## 2019-04-17 ENCOUNTER — Non-Acute Institutional Stay (SKILLED_NURSING_FACILITY): Payer: Medicare Other | Admitting: Adult Health

## 2019-04-17 DIAGNOSIS — I951 Orthostatic hypotension: Secondary | ICD-10-CM | POA: Diagnosis not present

## 2019-04-17 DIAGNOSIS — M4802 Spinal stenosis, cervical region: Secondary | ICD-10-CM

## 2019-04-17 DIAGNOSIS — G992 Myelopathy in diseases classified elsewhere: Secondary | ICD-10-CM

## 2019-04-17 DIAGNOSIS — S14129S Central cord syndrome at unspecified level of cervical spinal cord, sequela: Secondary | ICD-10-CM

## 2019-04-17 MED ORDER — METOPROLOL SUCCINATE ER 25 MG PO TB24: 12.5000 mg | ORAL_TABLET | Freq: Every day | ORAL | 0 refills | Status: DC

## 2019-04-17 MED ORDER — GABAPENTIN 300 MG PO CAPS: 300.0000 mg | ORAL_CAPSULE | Freq: Three times a day (TID) | ORAL | 0 refills | Status: DC

## 2019-04-17 MED ORDER — DICLOFENAC SODIUM 1 % TD GEL: 2.0000 g | Freq: Four times a day (QID) | TRANSDERMAL | 0 refills | Status: DC

## 2019-04-17 MED ORDER — PANTOPRAZOLE SODIUM 40 MG PO TBEC: 40.0000 mg | DELAYED_RELEASE_TABLET | Freq: Two times a day (BID) | ORAL | 0 refills | Status: DC

## 2019-04-17 MED ORDER — ROSUVASTATIN CALCIUM 5 MG PO TABS: 5.0000 mg | ORAL_TABLET | Freq: Every day | ORAL | 0 refills | Status: AC

## 2019-04-17 MED ORDER — TRAZODONE HCL 50 MG PO TABS: 25.0000 mg | ORAL_TABLET | Freq: Every day | ORAL | 0 refills | Status: DC

## 2019-04-17 MED ORDER — FLUDROCORTISONE ACETATE 0.1 MG PO TABS: 0.1000 mg | ORAL_TABLET | Freq: Every day | ORAL | 0 refills | Status: DC

## 2019-04-17 NOTE — Progress Notes (Signed)
Location:    [facility] Room Number: 128 P Place of Service:  SNF (31)    CODE STATUS: DNR  Allergies  Allergen Reactions  . Penicillins Hives, Swelling, Rash and Other (See Comments)    Did it involve swelling of the face/tongue/throat, SOB, or low BP? #  #  #  YES  #  #  #  Did it involve sudden or severe rash/hives, skin peeling, or any reaction on the inside of your mouth or nose? No Did you need to seek medical attention at a hospital or doctor's office? #  #  #  YES  #  #  #  When did it last happen?85 years old.   . Codeine Phosphate [Codeine] Other (See Comments)    headache    Chief Complaint  Patient presents with  . Discharge Note    Discharging to home on 04/18/2019    HPI:  She is being discharged to home with home health for pt/ot/rn/cna. She will need a standard wheelchair. She will need to follow up with her medical provider and will need her prescriptions written.  She had been admitted to the hospital for a cervical laminectomy. She was admitted to this facility for short term rehab. She continues to require to wear her cervical collar and she continues to have numbness in her hands. She is now ready to complete her therapy on a home health basis.     Past Medical History:  Diagnosis Date  . Allergic rhinitis   . Anosmia   . Arthritis   . Full dentures   . History of diverticulitis   . Insomnia   . Insomnia   . Senile osteoporosis   . SOB (shortness of breath)   . SVT (supraventricular tachycardia) (HCC)   . Varicose vein of leg    left  . Wears glasses     Past Surgical History:  Procedure Laterality Date  . COLON RESECTION  1989   diverticulitis  . COLONOSCOPY    . ESOPHAGEAL DILATION  2015   endoscopy  . POSTERIOR CERVICAL FUSION/FORAMINOTOMY N/A 02/11/2019   Procedure: POSTERIOR CERVICAL INSTRUMENTATION REMOVAL CERVICAL TWO;  Surgeon: Lisbeth Renshaw, MD;  Location: [facility] OR;  Service: Neurosurgery;   Laterality: N/A;  . POSTERIOR CERVICAL LAMINECTOMY N/A 02/06/2019   Procedure: Cervical one LAMINECTOMY, Occiput-Cervical four fusion;  Surgeon: Lisbeth Renshaw, MD;  Location: [facility] OR;  Service: Neurosurgery;  Laterality: N/A;    Social History   Socioeconomic History  . Marital status: Widowed    Spouse name: Not on file  . Number of children: Not on file  . Years of education: Not on file  . Highest education level: Not on file  Occupational History  . Not on file  Social Needs  . Financial resource strain: Not on file  . Food insecurity    Worry: Not on file    Inability: Not on file  . Transportation needs    Medical: Not on file    Non-medical: Not on file  Tobacco Use  . Smoking status: Never Smoker  . Smokeless tobacco: Never Used  Substance and Sexual Activity  . Alcohol use: Yes    Comment: rare  . Drug use: No  . Sexual activity: Not on file  Lifestyle  . Physical activity    Days per week: Not on file    Minutes per session: Not on file  . Stress: Not on file  Relationships  . Social connections  Talks on phone: Not on file    Gets together: Not on file    Attends religious service: Not on file    Active member of club or organization: Not on file    Attends meetings of clubs or organizations: Not on file    Relationship status: Not on file  . Intimate partner violence    Fear of current or ex partner: Not on file    Emotionally abused: Not on file    Physically abused: Not on file    Forced sexual activity: Not on file  Other Topics Concern  . Not on file  Social History Narrative  . Not on file   Family History  Problem Relation Age of Onset  . Cancer Mother   . High blood pressure Father   . Emphysema Brother     VITAL SIGNS BP (!) 154/79   Pulse 73   Temp 98 F (36.7 C)   Resp 20   Ht 5\' 3"  (1.6 m)   Wt 138 lb 6.4 oz (62.8 kg)   BMI 24.52 kg/m   Patient's Medications  New Prescriptions   No medications on file  Previous  Medications   ACETAMINOPHEN (TYLENOL) 325 MG TABLET    Take 1-2 tablets (325-650 mg total) by mouth every 4 (four) hours as needed for mild pain.   CALCIUM-VITAMIN D (OSCAL WITH D) 500-200 MG-UNIT PER TABLET    Take 1 tablet by mouth 2 (two) times daily.    DEXTRAN 70-HYPROMELLOSE (ARTIFICIAL TEARS) 0.1-0.3 % SOLN    Place 1 drop into both eyes 3 (three) times daily as needed.   DICLOFENAC SODIUM (VOLTAREN) 1 % GEL    Apply 2 g topically 4 (four) times daily.   FLUDROCORTISONE (FLORINEF) 0.1 MG TABLET    Take 1 tablet (0.1 mg total) by mouth daily.   GABAPENTIN (NEURONTIN) 300 MG CAPSULE    Take 1 capsule (300 mg total) by mouth 3 (three) times daily.   METOPROLOL SUCCINATE (TOPROL-XL) 25 MG 24 HR TABLET    Take 12.5 mg by mouth daily.   NON FORMULARY    Diet Type: Regular   NUTRITIONAL SUPPLEMENTS (ENSURE ENLIVE PO)    Take 1 Bottle by mouth daily. Prefers Vanilla   PANTOPRAZOLE (PROTONIX) 40 MG TABLET    Take 1 tablet (40 mg total) by mouth 2 (two) times daily.   POLYETHYLENE GLYCOL (MIRALAX / GLYCOLAX) 17 G PACKET    Take 17 g by mouth daily.   ROSUVASTATIN (CRESTOR) 5 MG TABLET    Take 5 mg by mouth daily.   SENNOSIDES-DOCUSATE SODIUM (SENOKOT-S) 8.6-50 MG TABLET    Take 2 tablets by mouth 2 (two) times daily as needed for constipation.   TRAZODONE (DESYREL) 50 MG TABLET    Take 25 mg by mouth at bedtime.  Modified Medications   No medications on file  Discontinued Medications   OXYCODONE (ROXICODONE) 5 MG IMMEDIATE RELEASE TABLET    Take 1 tablet (5 mg total) by mouth daily.     SIGNIFICANT DIAGNOSTIC EXAMS  PREVIOUS;  02-10-19: ct of cervical spine:  1. Status post posterior occipitocervical fusion to the level of C3-C4, there is improved alignment of a chronic fracture of the dens, with approximately 5 mm anterolisthesis of C1 on C2. 2. Left approach right-sided C2 laminar screw intrudes into the posterior spinal canal and right lateral recess (series 8, image 32). Correlate for  referable radiculopathy symptoms. 3. Moderate multilevel disc degenerative disease of the cervical spine  from C3 through C7, with ankylosis of C3-C4.  02-12-19: MRI: of cervical spine:  Apparent interval removal the right laminar screw at C2 which appeared to intrude upon the canal. Continued 5 mm anterolisthesis of C1 and the nonunited dens fragment relative to the body of C2. Continued canal stenosis with AP diameter of the canal as narrow as 3.4 mm, flattening of the cord and abnormal T2 cord signal. In this patient has had interval laminectomy at C1, it is difficult to explain how the AP diameter of the canal actually measures smaller than on the pre-surgical exam. This could relate to postoperative edema the posterior tissues. Lesser but potentially significant stenosis from C2-3 through C7-T1 as outlined above.  02-17-19: bilateral lower extremity doppler: Right: There is no evidence of deep vein thrombosis in the lower extremity. A cystic structure is found in the popliteal fossa. Left: There is no evidence of deep vein thrombosis in the lower extremity. No cystic structure found in the popliteal fossa.  NO NEW EXAMS.   LABS REVIEWED PREVIOUS:   02-07-19: wbc 10.9; hgb 11.6; hct 34.8; mcv 90.9; plt 172 glucose 149; bun 17; creat 0.68; k+ 4.3; na++ 135; ca 8.8  02-16-19: wbc 8.2; hgb 11.1; hct 33.4; mcv 91.0 plt 267 glucose 215; bun 14; creat 0.54; k+ 3.6; na++ 134; ca 8.6 liver normal albumin 2.7 urine culture: no growth  03-03-19: wbc 5.1; hgb 10.1; hct 30.9; mcv 92.8; plt 184; glucose 98; bun 15; create 0.59; k+ 3.7; na++ 137; ca 9.1  03-10-19: wbc 5.1; hgb 11.1; hct 35.7; mcv 93.7;plt 229; glucose 100; bun 16; creat 0.86; k+ 4.2; na++ 137; ca 9.2   NO NEW LABS.    Review of Systems  Constitutional: Negative for malaise/fatigue.  Respiratory: Negative for cough and shortness of breath.   Cardiovascular: Negative for chest pain, palpitations and leg swelling.  Gastrointestinal: Negative  for abdominal pain, constipation and heartburn.  Musculoskeletal: Negative for back pain, joint pain and myalgias.  Skin: Negative.   Neurological: Negative for dizziness.       Bilateral hand numbness   Psychiatric/Behavioral: The patient is not nervous/anxious.     Physical Exam Constitutional:      General: She is not in acute distress.    Appearance: She is well-developed. She is not diaphoretic.     Comments: thin  Neck:     Musculoskeletal: Neck supple.     Thyroid: No thyromegaly.  Cardiovascular:     Rate and Rhythm: Normal rate and regular rhythm.     Pulses: Normal pulses.     Heart sounds: Normal heart sounds.  Pulmonary:     Effort: Pulmonary effort is normal. No respiratory distress.     Breath sounds: Normal breath sounds.  Abdominal:     General: Bowel sounds are normal. There is no distension.     Palpations: Abdomen is soft.     Tenderness: There is no abdominal tenderness.  Musculoskeletal:     Right lower leg: No edema.     Left lower leg: No edema.     Comments: Cervical collar in place Is able to move all extremities  Has bilateral hand numbness Wearing  abdominal binder and support hose     Lymphadenopathy:     Cervical: No cervical adenopathy.  Skin:    General: Skin is warm and dry.  Neurological:     Mental Status: She is alert and oriented to person, place, and time.  Psychiatric:  Mood and Affect: Mood normal.      ASSESSMENT/ PLAN:   Patient is being discharged with the following home health services:  Pt/ot/rn/cna: to evaluate and treat as indicated for gait balance strength adl training medication management   Patient is being discharged with the following durable medical equipment:  Standard wheelchair with cushion; anti-tippers; brake extensions and standard leg rests; to allow her to maintain her current level of independence with her adls which cannot be achieved with a walker.   Patient has been advised to f/u with their  PCP in 1-2 weeks to bring them up to date on their rehab stay.  Social services at facility was responsible for arranging this appointment.  Pt was provided with a 30 day supply of prescriptions for medications and refills must be obtained from their PCP.  For controlled substances, a more limited supply may be provided adequate until PCP appointment only.   A 30 day supply of her prescription medications have been sent to [facility] with #90 neurontin 300 mg tabs.  Time spent with patient: 40 minutes: medications; home health needs and dme.    Synthia Innocent NP [facility]  Contact [phone] Monday through Friday 8am- 5pm  After hours call [phone]

## 2019-04-19 DIAGNOSIS — Z981 Arthrodesis status: Secondary | ICD-10-CM | POA: Diagnosis not present

## 2019-04-19 DIAGNOSIS — I10 Essential (primary) hypertension: Secondary | ICD-10-CM

## 2019-04-19 DIAGNOSIS — G8918 Other acute postprocedural pain: Secondary | ICD-10-CM | POA: Diagnosis not present

## 2019-04-19 DIAGNOSIS — G909 Disorder of the autonomic nervous system, unspecified: Secondary | ICD-10-CM

## 2019-04-19 DIAGNOSIS — I471 Supraventricular tachycardia: Secondary | ICD-10-CM

## 2019-04-19 DIAGNOSIS — M5412 Radiculopathy, cervical region: Secondary | ICD-10-CM

## 2019-04-19 DIAGNOSIS — Z4789 Encounter for other orthopedic aftercare: Secondary | ICD-10-CM | POA: Diagnosis not present

## 2019-04-19 DIAGNOSIS — I951 Orthostatic hypotension: Secondary | ICD-10-CM

## 2019-05-07 ENCOUNTER — Encounter: Payer: Self-pay | Admitting: Physical Medicine & Rehabilitation

## 2019-05-07 ENCOUNTER — Encounter: Payer: Medicare Other | Attending: Physical Medicine & Rehabilitation | Admitting: Physical Medicine & Rehabilitation

## 2019-05-07 ENCOUNTER — Other Ambulatory Visit: Payer: Self-pay

## 2019-05-07 VITALS — BP 135/75 | HR 66 | Temp 99.0°F

## 2019-05-07 DIAGNOSIS — Z9889 Other specified postprocedural states: Secondary | ICD-10-CM | POA: Diagnosis not present

## 2019-05-07 DIAGNOSIS — I951 Orthostatic hypotension: Secondary | ICD-10-CM

## 2019-05-07 DIAGNOSIS — S14129S Central cord syndrome at unspecified level of cervical spinal cord, sequela: Secondary | ICD-10-CM

## 2019-05-07 DIAGNOSIS — I471 Supraventricular tachycardia: Secondary | ICD-10-CM

## 2019-05-07 NOTE — Progress Notes (Addendum)
Subjective:    Patient ID: Denise Espinoza, female    DOB: 19-Mar-1930, 85 y.o.   MRN: [id number]  HPI   Denise Espinoza is here in follow up of her cervical spinal cord injury. She was discharged from [facility] center a frew days ago. She has increased her mobility and has been walking with her RW now. (Sometimes she goes without it!). She still reports some numbness in her hands.   At home she has noted episodes where she feels as if she's going to pass out. Last week apparently she was sitting at the kitchen table last week and suddenly felt as if she was going to "go out."  She denies frank LOC. She denies chest pain or palpitations but does have a history of SVT and has been followed by a cardiologist in [place], [place]    Pain Inventory Average Pain 6 Pain Right Now 5 My pain is dull  In the last 24 hours, has pain interfered with the following? General activity 0 Relation with others 0 Enjoyment of life 0 What TIME of day is your pain at its worst? evening Sleep (in general) Fair  Pain is worse with: sitting Pain improves with: rest and medication Relief from Meds: 4  Mobility walk with assistance use a walker ability to climb steps?  yes do you drive?  no use a wheelchair transfers alone  Function retired I need assistance with the following:  dressing and meal prep  Neuro/Psych weakness numbness trouble walking spasms dizziness confusion depression anxiety  Prior Studies x-rays  Physicians involved in your care Primary care Beola Cord, FNP   Family History  Problem Relation Age of Onset  . Cancer Mother   . High blood pressure Father   . Emphysema Brother    Social History   Socioeconomic History  . Marital status: Widowed    Spouse name: Not on file  . Number of children: Not on file  . Years of education: Not on file  . Highest education level: Not on file  Occupational History  . Not on file  Social Needs  . Financial resource strain: Not  on file  . Food insecurity    Worry: Not on file    Inability: Not on file  . Transportation needs    Medical: Not on file    Non-medical: Not on file  Tobacco Use  . Smoking status: Never Smoker  . Smokeless tobacco: Never Used  Substance and Sexual Activity  . Alcohol use: Yes    Comment: rare  . Drug use: No  . Sexual activity: Not on file  Lifestyle  . Physical activity    Days per week: Not on file    Minutes per session: Not on file  . Stress: Not on file  Relationships  . Social Herbalist on phone: Not on file    Gets together: Not on file    Attends religious service: Not on file    Active member of club or organization: Not on file    Attends meetings of clubs or organizations: Not on file    Relationship status: Not on file  Other Topics Concern  . Not on file  Social History Narrative  . Not on file   Past Surgical History:  Procedure Laterality Date  . COLON RESECTION  1989   diverticulitis  . COLONOSCOPY    . ESOPHAGEAL DILATION  2015   endoscopy  . POSTERIOR CERVICAL FUSION/FORAMINOTOMY N/A 02/11/2019  Procedure: POSTERIOR CERVICAL INSTRUMENTATION REMOVAL CERVICAL TWO;  Surgeon: Lisbeth Renshaw, MD;  Location: [facility] OR;  Service: Neurosurgery;  Laterality: N/A;  . POSTERIOR CERVICAL LAMINECTOMY N/A 02/06/2019   Procedure: Cervical one LAMINECTOMY, Occiput-Cervical four fusion;  Surgeon: Lisbeth Renshaw, MD;  Location: [facility] OR;  Service: Neurosurgery;  Laterality: N/A;   Past Medical History:  Diagnosis Date  . Allergic rhinitis   . Anosmia   . Arthritis   . Full dentures   . History of diverticulitis   . Insomnia   . Insomnia   . Senile osteoporosis   . SOB (shortness of breath)   . SVT (supraventricular tachycardia) (HCC)   . Varicose vein of leg    left  . Wears glasses    BP 135/75   Pulse 66   Temp 99 F (37.2 C)   SpO2 93%   Opioid Risk Score:   Fall Risk Score:  `1  Depression screen PHQ 2/9  No flowsheet data  found.   Review of Systems  Constitutional: Negative.   HENT: Negative.   Eyes: Negative.   Respiratory: Negative.   Gastrointestinal: Negative.   Endocrine: Negative.   Genitourinary: Negative.   Musculoskeletal: Positive for gait problem and neck pain.  Skin: Negative.   Allergic/Immunologic: Negative.   Neurological: Positive for weakness and numbness.  Hematological: Negative.   Psychiatric/Behavioral: Negative.   All other systems reviewed and are negative.      Objective:   Physical Exam  General: Alert and oriented x 3, No apparent distress HEENT: bruising and laceration below right ey.  PERRLA, EOMI, sclera anicteric, oral mucosa pink and moist, dentition intact, ext ear canals clear,  Neck: Supple without JVD or lymphadenopathy Heart: Reg rate and rhythm. No murmurs rubs or gallops Chest: CTA bilaterally without wheezes, rales, or rhonchi; no distress Abdomen: Soft, non-tender, non-distended, bowel sounds positive. Extremities: No clubbing, cyanosis. Trace LE edema, wearing ACE's bilaterally which are very loose at this point in the day. Pulses are 2+ Skin: abrasion left hand, mulitple bruises. Neuro: Pt is cognitively appropriate with normal insight, memory, and awareness. Cranial nerves 2-12 are intact. Sensory exam is decreased to LT in both hands and feet still, hands are more affected. . Reflexes are 1+ in all 4's. Fine motor coordination is intact. No tremors. Motor function is grossly 5/5.  Musculoskeletal: Full ROM, No pain with AROM or PROM in the neck, trunk, or extremities. Arthritic deformities bilateral hands.  Psych: Pt's affect is appropriate. 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 Pt is s/p C1 laminectomy, C2 ORIF and Occiput to C4 PLIF -continue HH therapy  -making nice progress from a functional standpoint  - I feel  she will need outpt therapy as well to improve balance and stamina. 2. Lower extremity edema: likely d/t dependent edema  -gabapentin and florinef also likely playing a role  -taper off gabapentin, dc florinef  -continue compression/ACE wraps 3. Pain Management: voltaren gel prn, tylenol  -wean gabapentin as above 4. H/o SVT with ongoing syncopal/presyncopal episodes:  -Continue metoprolol daily              -having sudden drop outs, most recently two days ago while just sitting in chair at kitchen table.  Don't believe these are sci/orthostatic  -given her cardiac history, will make a referral to cardiology to assess. She would prefer to see a specialist  in [place]. Will refer to [facility] cardiology 9. Neurogenic bowel:              Relatively controlled with current regimen 10. Neurogenic bladder:Has been incontinent for years.              -time toileting at home helping with continence 11. Orthostasis/hypotension             -I think in general this has improved  -dropouts most likely due to #4             -continue/Abd binder when up             -stop florinef as above             -needs metoprolol for rate control                 13.  Acute blood loss anemia             Hemoglobin 11.3 most recently.   Thirty minutes of face to face patient care time were spent during this visit. All questions were encouraged and answered. Follow up with me in 2 MONTHS.

## 2019-05-07 NOTE — Patient Instructions (Addendum)
PLEASE FEEL FREE TO CALL OUR OFFICE WITH ANY PROBLEMS OR QUESTIONS ([phone])   GABAPENTIN:  TAKE ONE AT NIGHT FOR ONE WEEK THEN STOP.

## 2019-05-15 ENCOUNTER — Telehealth: Payer: Self-pay | Admitting: *Deleted

## 2019-05-15 DIAGNOSIS — R55 Syncope and collapse: Secondary | ICD-10-CM

## 2019-05-15 NOTE — Telephone Encounter (Signed)
Notified HH

## 2019-05-15 NOTE — Telephone Encounter (Signed)
Denise Espinoza with [facility] called with concerns over ongoing dizzy/black out spells.  Denise Espinoza had 2 yesterday. They seem to think DR Naaman Plummer was going to refer to cardiology but no one has called them about an appointment. Please advise.

## 2019-05-15 NOTE — Telephone Encounter (Signed)
I thought I had made the referral last week. Placed referral today with [facility] upstairs

## 2019-05-17 ENCOUNTER — Emergency Department (HOSPITAL_COMMUNITY): Payer: Medicare Other

## 2019-05-17 ENCOUNTER — Other Ambulatory Visit (HOSPITAL_COMMUNITY): Payer: Self-pay

## 2019-05-17 ENCOUNTER — Inpatient Hospital Stay (HOSPITAL_COMMUNITY)
Admission: EM | Admit: 2019-05-17 | Discharge: 2019-05-24 | DRG: 242 | Disposition: A | Discharge status: Home Health | Payer: Medicare Other | Attending: Internal Medicine | Admitting: Internal Medicine

## 2019-05-17 ENCOUNTER — Other Ambulatory Visit: Payer: Self-pay

## 2019-05-17 ENCOUNTER — Encounter (HOSPITAL_COMMUNITY): Payer: Self-pay

## 2019-05-17 DIAGNOSIS — Z825 Family history of asthma and other chronic lower respiratory diseases: Secondary | ICD-10-CM | POA: Diagnosis not present

## 2019-05-17 DIAGNOSIS — G992 Myelopathy in diseases classified elsewhere: Secondary | ICD-10-CM | POA: Diagnosis present

## 2019-05-17 DIAGNOSIS — Z880 Allergy status to penicillin: Secondary | ICD-10-CM

## 2019-05-17 DIAGNOSIS — E785 Hyperlipidemia, unspecified: Secondary | ICD-10-CM | POA: Diagnosis present

## 2019-05-17 DIAGNOSIS — M1990 Unspecified osteoarthritis, unspecified site: Secondary | ICD-10-CM | POA: Diagnosis present

## 2019-05-17 DIAGNOSIS — Z981 Arthrodesis status: Secondary | ICD-10-CM

## 2019-05-17 DIAGNOSIS — I471 Supraventricular tachycardia, unspecified: Secondary | ICD-10-CM | POA: Diagnosis present

## 2019-05-17 DIAGNOSIS — R4702 Dysphasia: Secondary | ICD-10-CM | POA: Diagnosis not present

## 2019-05-17 DIAGNOSIS — I495 Sick sinus syndrome: Secondary | ICD-10-CM | POA: Diagnosis not present

## 2019-05-17 DIAGNOSIS — G4700 Insomnia, unspecified: Secondary | ICD-10-CM | POA: Diagnosis not present

## 2019-05-17 DIAGNOSIS — W19XXXA Unspecified fall, initial encounter: Secondary | ICD-10-CM | POA: Diagnosis present

## 2019-05-17 DIAGNOSIS — S2239XA Fracture of one rib, unspecified side, initial encounter for closed fracture: Secondary | ICD-10-CM | POA: Diagnosis present

## 2019-05-17 DIAGNOSIS — Z8679 Personal history of other diseases of the circulatory system: Secondary | ICD-10-CM

## 2019-05-17 DIAGNOSIS — K219 Gastro-esophageal reflux disease without esophagitis: Secondary | ICD-10-CM | POA: Diagnosis present

## 2019-05-17 DIAGNOSIS — Z95818 Presence of other cardiac implants and grafts: Secondary | ICD-10-CM

## 2019-05-17 DIAGNOSIS — I951 Orthostatic hypotension: Secondary | ICD-10-CM | POA: Diagnosis not present

## 2019-05-17 DIAGNOSIS — Z20828 Contact with and (suspected) exposure to other viral communicable diseases: Secondary | ICD-10-CM | POA: Diagnosis not present

## 2019-05-17 DIAGNOSIS — G92 Toxic encephalopathy: Secondary | ICD-10-CM | POA: Diagnosis not present

## 2019-05-17 DIAGNOSIS — Z9889 Other specified postprocedural states: Secondary | ICD-10-CM

## 2019-05-17 DIAGNOSIS — R4701 Aphasia: Secondary | ICD-10-CM | POA: Diagnosis not present

## 2019-05-17 DIAGNOSIS — I071 Rheumatic tricuspid insufficiency: Secondary | ICD-10-CM | POA: Diagnosis present

## 2019-05-17 DIAGNOSIS — R4781 Slurred speech: Secondary | ICD-10-CM | POA: Diagnosis present

## 2019-05-17 DIAGNOSIS — Z885 Allergy status to narcotic agent status: Secondary | ICD-10-CM

## 2019-05-17 DIAGNOSIS — R569 Unspecified convulsions: Secondary | ICD-10-CM | POA: Diagnosis not present

## 2019-05-17 DIAGNOSIS — Z809 Family history of malignant neoplasm, unspecified: Secondary | ICD-10-CM

## 2019-05-17 DIAGNOSIS — M810 Age-related osteoporosis without current pathological fracture: Secondary | ICD-10-CM | POA: Diagnosis present

## 2019-05-17 DIAGNOSIS — Y92009 Unspecified place in unspecified non-institutional (private) residence as the place of occurrence of the external cause: Secondary | ICD-10-CM | POA: Diagnosis not present

## 2019-05-17 DIAGNOSIS — K5909 Other constipation: Secondary | ICD-10-CM | POA: Diagnosis not present

## 2019-05-17 DIAGNOSIS — M4802 Spinal stenosis, cervical region: Secondary | ICD-10-CM | POA: Diagnosis present

## 2019-05-17 DIAGNOSIS — S14129A Central cord syndrome at unspecified level of cervical spinal cord, initial encounter: Secondary | ICD-10-CM | POA: Diagnosis present

## 2019-05-17 DIAGNOSIS — R29702 NIHSS score 2: Secondary | ICD-10-CM | POA: Diagnosis present

## 2019-05-17 DIAGNOSIS — R4789 Other speech disturbances: Secondary | ICD-10-CM

## 2019-05-17 DIAGNOSIS — R55 Syncope and collapse: Secondary | ICD-10-CM | POA: Diagnosis present

## 2019-05-17 LAB — SARS CORONAVIRUS 2 BY RT PCR (HOSPITAL ORDER, PERFORMED IN ~~LOC~~ HOSPITAL LAB): SARS Coronavirus 2: NEGATIVE

## 2019-05-17 LAB — COMPREHENSIVE METABOLIC PANEL
ALT: 10 U/L (ref 0–44)
AST: 20 U/L (ref 15–41)
Albumin: 4 g/dL (ref 3.5–5.0)
Alkaline Phosphatase: 91 U/L (ref 38–126)
Anion gap: 10 (ref 5–15)
BUN: 11 mg/dL (ref 8–23)
CO2: 23 mmol/L (ref 22–32)
Calcium: 9.2 mg/dL (ref 8.9–10.3)
Chloride: 108 mmol/L (ref 98–111)
Creatinine, Ser: 0.55 mg/dL (ref 0.44–1.00)
GFR calc Af Amer: >60 mL/min (ref >60)
GFR calc non Af Amer: >60 mL/min (ref >60)
Glucose, Bld: 91 mg/dL (ref 70–99)
Potassium: 3.9 mmol/L (ref 3.5–5.1)
Sodium: 141 mmol/L (ref 135–145)
Total Bilirubin: 0.9 mg/dL (ref 0.3–1.2)
Total Protein: 7.3 g/dL (ref 6.5–8.1)

## 2019-05-17 LAB — CBC
HCT: 38 % (ref 36.0–46.0)
Hemoglobin: 11.9 g/dL — ABNORMAL LOW (ref 12.0–15.0)
MCH: 28.8 pg (ref 26.0–34.0)
MCHC: 31.3 g/dL (ref 30.0–36.0)
MCV: 92 fL (ref 80.0–100.0)
Platelets: 190 10*3/uL (ref 150–400)
RBC: 4.13 MIL/uL (ref 3.87–5.11)
RDW: 14.1 % (ref 11.5–15.5)
WBC: 4.6 10*3/uL (ref 4.0–10.5)
nRBC: 0 % (ref 0.0–0.2)

## 2019-05-17 LAB — POCT I-STAT, CHEM 8
BUN: 11 mg/dL (ref 8–23)
Calcium, Ion: 1.13 mmol/L — ABNORMAL LOW (ref 1.15–1.40)
Chloride: 106 mmol/L (ref 98–111)
Creatinine, Ser: 0.5 mg/dL (ref 0.44–1.00)
Glucose, Bld: 85 mg/dL (ref 70–99)
HCT: 36 % (ref 36.0–46.0)
Hemoglobin: 12.2 g/dL (ref 12.0–15.0)
Potassium: 4.5 mmol/L (ref 3.5–5.1)
Sodium: 141 mmol/L (ref 135–145)
TCO2: 25 mmol/L (ref 22–32)

## 2019-05-17 LAB — DIFFERENTIAL
Abs Immature Granulocytes: 0.01 10*3/uL (ref 0.00–0.07)
Basophils Absolute: 0 10*3/uL (ref 0.0–0.1)
Basophils Relative: 0 %
Eosinophils Absolute: 0 10*3/uL (ref 0.0–0.5)
Eosinophils Relative: 0 %
Immature Granulocytes: 0 %
Lymphocytes Relative: 25 %
Lymphs Abs: 1.2 10*3/uL (ref 0.7–4.0)
Monocytes Absolute: 0.3 10*3/uL (ref 0.1–1.0)
Monocytes Relative: 5 %
Neutro Abs: 3.2 10*3/uL (ref 1.7–7.7)
Neutrophils Relative %: 70 %

## 2019-05-17 LAB — PROTIME-INR
INR: 1.1 (ref 0.8–1.2)
Prothrombin Time: 14.1 seconds (ref 11.4–15.2)

## 2019-05-17 LAB — GLUCOSE, CAPILLARY: Glucose-Capillary: 86 mg/dL (ref 70–99)

## 2019-05-17 LAB — APTT: aPTT: 31 seconds (ref 24–36)

## 2019-05-17 MED ORDER — ACETAMINOPHEN 325 MG PO TABS
650.0000 mg | ORAL_TABLET | ORAL | Status: DC | PRN
  Administered 2019-05-17 – 2019-05-24 (×3): 650 mg via ORAL
  Filled 2019-05-17 (×3): qty 2

## 2019-05-17 MED ORDER — PANTOPRAZOLE SODIUM 40 MG PO TBEC
40.0000 mg | DELAYED_RELEASE_TABLET | Freq: Two times a day (BID) | ORAL | Status: DC
  Administered 2019-05-17 – 2019-05-24 (×14): 40 mg via ORAL
  Filled 2019-05-17 (×14): qty 1

## 2019-05-17 MED ORDER — CIPROFLOXACIN HCL 250 MG PO TABS: 500.0000 mg | ORAL_TABLET | Freq: Once | ORAL | Status: DC

## 2019-05-17 MED ORDER — ACETAMINOPHEN 160 MG/5ML PO SOLN: 650.0000 mg | ORAL | Status: DC | PRN

## 2019-05-17 MED ORDER — SODIUM CHLORIDE 0.9% FLUSH
3.0000 mL | Freq: Once | INTRAVENOUS | Status: AC
  Administered 2019-05-17: 3 mL via INTRAVENOUS

## 2019-05-17 MED ORDER — ROSUVASTATIN CALCIUM 5 MG PO TABS
5.0000 mg | ORAL_TABLET | Freq: Every day | ORAL | Status: DC
  Administered 2019-05-18 – 2019-05-24 (×7): 5 mg via ORAL
  Filled 2019-05-17 (×7): qty 1

## 2019-05-17 MED ORDER — ASPIRIN 325 MG PO TABS
325.0000 mg | ORAL_TABLET | Freq: Once | ORAL | Status: AC
  Administered 2019-05-17: 325 mg via ORAL
  Filled 2019-05-17: qty 1

## 2019-05-17 MED ORDER — ACETAMINOPHEN 650 MG RE SUPP: 650.0000 mg | RECTAL | Status: DC | PRN

## 2019-05-17 MED ORDER — GABAPENTIN 300 MG PO CAPS
300.0000 mg | ORAL_CAPSULE | Freq: Three times a day (TID) | ORAL | Status: DC
  Administered 2019-05-17 – 2019-05-24 (×20): 300 mg via ORAL
  Filled 2019-05-17 (×20): qty 1

## 2019-05-17 MED ORDER — SENNOSIDES-DOCUSATE SODIUM 8.6-50 MG PO TABS: 1.0000 | ORAL_TABLET | Freq: Every evening | ORAL | Status: DC | PRN

## 2019-05-17 MED ORDER — TRAZODONE HCL 50 MG PO TABS
25.0000 mg | ORAL_TABLET | Freq: Every day | ORAL | Status: DC
  Administered 2019-05-17: 25 mg via ORAL
  Filled 2019-05-17: qty 1

## 2019-05-17 MED ORDER — METOPROLOL SUCCINATE ER 25 MG PO TB24
12.5000 mg | ORAL_TABLET | Freq: Every day | ORAL | Status: DC
  Administered 2019-05-18 – 2019-05-19 (×2): 12.5 mg via ORAL
  Filled 2019-05-17 (×2): qty 1

## 2019-05-17 MED ORDER — STROKE: EARLY STAGES OF RECOVERY BOOK
Freq: Once | Status: AC
  Administered 2019-05-17: 1
  Filled 2019-05-17: qty 1

## 2019-05-17 MED ORDER — ENOXAPARIN SODIUM 40 MG/0.4ML ~~LOC~~ SOLN
40.0000 mg | SUBCUTANEOUS | Status: DC
  Administered 2019-05-17 – 2019-05-22 (×6): 40 mg via SUBCUTANEOUS
  Filled 2019-05-17 (×6): qty 0.4

## 2019-05-17 MED ORDER — ASPIRIN EC 81 MG PO TBEC
81.0000 mg | DELAYED_RELEASE_TABLET | Freq: Every day | ORAL | Status: DC
  Administered 2019-05-18 – 2019-05-24 (×7): 81 mg via ORAL
  Filled 2019-05-17 (×7): qty 1

## 2019-05-17 MED ORDER — METRONIDAZOLE 500 MG PO TABS: 500.0000 mg | ORAL_TABLET | Freq: Once | ORAL | Status: DC

## 2019-05-17 NOTE — Consult Note (Signed)
   [facility] TeleNeurology Consult Services  Stat Consult  Date of Service:   05/17/2019 16:16:15  Impression:     .  Rule Out Acute Ischemic Stroke  Comments/Sign-Out: Patient complains of aphasia. Last seen normal was yesterday. HCT showed no acute process. no tPA since outside of treatment window. Would recommend ASA for secondary prevention.  CT HEAD: Showed No Acute Hemorrhage or Acute Core Infarct  Metrics: [facility] Notification Time: 05/17/2019 16:14:32 Stamp Time: 05/17/2019 16:16:15 Callback Response Time: 05/17/2019 16:20:42 Video Start Time: 05/17/2019 16:50:02 Video End Time: 05/17/2019 16:58:38  Our recommendations are outlined below.  Recommendations:     .  Initiate Aspirin 325 MG Daily   Imaging Studies:     .  MRI Head Without Contrast     .  MRA Head and Neck Without Contrast When Available - Stroke Protocol     .  Echocardiogram - Transthoracic Echocardiogram  Therapies:     .  Physical Therapy, Occupational Therapy, Speech Therapy Assessment When Applicable  Disposition: Neurology Follow Up Recommended  Sign Out:     .  Discussed with Emergency Department Provider  ----------------------------------------------------------------------------------------------------  Chief Complaint: Aphasia.  History of Present Illness: Patient is a 85 year old Female.  PMH of HLD, GERD, Cervical Injury presented with aphasia. Symptoms started last night. Per report grand daughter brought her to ER for further evaluation since it didn't get any better. Recent fall withing the past couple of weeks.   Examination: 1A: Level of Consciousness - Alert; keenly responsive + 0 1B: Ask Month and Age - Both Questions Right + 0 1C: Blink Eyes & Squeeze Hands - Performs Both Tasks + 0 2: Test Horizontal Extraocular Movements - Normal + 0 3: Test Visual Fields - No Visual Loss + 0 4: Test Facial Palsy (Use Grimace if Obtunded) - Normal symmetry + 0 5A:  Test Left Arm Motor Drift - No Drift for 10 Seconds + 0 5B: Test Right Arm Motor Drift - No Drift for 10 Seconds + 0 6A: Test Left Leg Motor Drift - No Drift for 5 Seconds + 0 6B: Test Right Leg Motor Drift - No Drift for 5 Seconds + 0 7: Test Limb Ataxia (FNF/Heel-Shin) - No Ataxia + 0 8: Test Sensation - Mild-Moderate Loss: Less Sharp/More Dull + 1 9: Test Language/Aphasia - Mild-Moderate Aphasia: Some Obvious Changes, Without Significant Limitation + 1 10: Test Dysarthria - Normal + 0 11: Test Extinction/Inattention - No abnormality + 0  NIHSS Score: 2  Patient/Family was informed the Neurology Consult would happen via TeleHealth consult by way of interactive audio and video telecommunications and consented to receiving care in this manner.    Dr Hinda Lenis Taelyn Broecker   [facility] [phone]  Case [id number]

## 2019-05-17 NOTE — H&P (Addendum)
History and Physical    Denise PilarOpal Mabey AVW:[id number] DOB: 13-Jun-1930 DOA: 05/17/2019  PCP: Tacy LearnGrabowski, Kristen, FNP   Patient coming from: Home  I have personally briefly reviewed patient's old medical records [facility]  Chief Complaint: Abnormal speech.  HPI: Denise Espinoza is a 85 y.o. female with medical history significant for cervical spine stenosis with myelopathy, central cord syndrome, SVT, was brought to the ED by family with reports of difficulty getting her words out and unable to finish sentences that started yesterday.  Patient denies change in her vision, weakness of her extremities, abnormal sensation, facial droop.  She is not on aspirin.  I talked to patient's granddaughter who resides with patient.  She reports this difficulty finding words is sudden very different from her baseline.  Patient was even having difficulty calling her granddaughter's name.  Patient at some point told her granddaughter that she knows what she wants to say she just has difficulty saying it.  At baseline she is very sharp and fluent for age.  Granddaughter denies slurred speech or other focal deficits. Patient also fell 2 weeks ago, she fell from a sitting position at the counter at her house. Fall was unwitnessed, patient was found on the floor.  Patient thinks she might have been out for about 1 to 2 minutes. She has been having dizzy spells.  It was just discharged [facility] 04[facility], for rehabilitation for functional mobility deficits secondary to C2 fracture and associated myelopathy and central cord pattern, after patient had C1 laminectomy, C2 ORIF and occipital to C4 PLIF.  Also reported where syncopal/presyncopal episodes with patient having sudden "dropouts".  Per notes Florinef was to be discontinued.  Consult to cardiology was placed, as she has a history of SVT.  ED Course: Blood pressure systolic 130s -170s.  Head and cervical CT-no acute intracranial abnormalities,  No acute cervical spine fracture.  Neurologist consulted, patient outside TPA window.  Aspirin recommended and stroke work-up.  Hospitalist to admit.  Review of Systems: As per HPI all other systems reviewed and negative.  Past Medical History:  Diagnosis Date   Allergic rhinitis    Anosmia    Arthritis    Full dentures    History of diverticulitis    Insomnia    Insomnia    Senile osteoporosis    SOB (shortness of breath)    SVT (supraventricular tachycardia) (HCC)    Varicose vein of leg    left   Wears glasses     Past Surgical History:  Procedure Laterality Date   COLON RESECTION  1989   diverticulitis   COLONOSCOPY     ESOPHAGEAL DILATION  2015   endoscopy   POSTERIOR CERVICAL FUSION/FORAMINOTOMY N/A 02/11/2019   Procedure: POSTERIOR CERVICAL INSTRUMENTATION REMOVAL CERVICAL TWO;  Surgeon: Lisbeth RenshawNundkumar, Neelesh, MD;  Location: [facility] OR;  Service: Neurosurgery;  Laterality: N/A;   POSTERIOR CERVICAL LAMINECTOMY N/A 02/06/2019   Procedure: Cervical one LAMINECTOMY, Occiput-Cervical four fusion;  Surgeon: Lisbeth RenshawNundkumar, Neelesh, MD;  Location: [facility] OR;  Service: Neurosurgery;  Laterality: N/A;     reports that she has never smoked. She has never used smokeless tobacco. She reports current alcohol use. She reports that she does not use drugs.  Allergies  Allergen Reactions   Penicillins Hives, Swelling, Rash and Other (See Comments)    Did it involve swelling of the face/tongue/throat, SOB, or low BP? #  #  #  YES  #  #  #  Did it involve  sudden or severe rash/hives, skin peeling, or any reaction on the inside of your mouth or nose? No Did you need to seek medical attention at a hospital or doctor's office? #  #  #  YES  #  #  #  When did it last happen?85 years old.    Codeine Phosphate [Codeine] Other (See Comments)    headache    Family History  Problem Relation Age of Onset   Cancer Mother    High blood pressure Father    Emphysema Brother      Prior to Admission medications   Medication Sig Start Date End Date Taking? Authorizing Provider  calcium-vitamin D (OSCAL WITH D) 500-200 MG-UNIT per tablet Take 1 tablet by mouth 2 (two) times daily.    Yes [provider]  Dextran 70-Hypromellose (ARTIFICIAL TEARS) 0.1-0.3 % SOLN Place 1 drop into both eyes 3 (three) times daily as needed. 03/24/19  Yes [provider]  diclofenac sodium (VOLTAREN) 1 % GEL Apply 2 g topically 4 (four) times daily. 04/17/19  Yes Gerlene Fee, NP  fludrocortisone (FLORINEF) 0.1 MG tablet Take 1 tablet (0.1 mg total) by mouth daily. 04/17/19  Yes Gerlene Fee, NP  gabapentin (NEURONTIN) 300 MG capsule Take 1 capsule (300 mg total) by mouth 3 (three) times daily. 04/17/19  Yes Gerlene Fee, NP  metoprolol succinate (TOPROL-XL) 25 MG 24 hr tablet Take 0.5 tablets (12.5 mg total) by mouth daily. 04/17/19  Yes Gerlene Fee, NP  pantoprazole (PROTONIX) 40 MG tablet Take 1 tablet (40 mg total) by mouth 2 (two) times daily. 04/17/19  Yes Gerlene Fee, NP  rosuvastatin (CRESTOR) 5 MG tablet Take 1 tablet (5 mg total) by mouth daily. 04/17/19  Yes Gerlene Fee, NP  sennosides-docusate sodium (SENOKOT-S) 8.6-50 MG tablet Take 2 tablets by mouth 2 (two) times daily as needed for constipation. 03/24/19  Yes [provider]  traZODone (DESYREL) 50 MG tablet Take 0.5 tablets (25 mg total) by mouth at bedtime. 04/17/19  Yes Gerlene Fee, NP  acetaminophen (TYLENOL) 325 MG tablet Take 1-2 tablets (325-650 mg total) by mouth every 4 (four) hours as needed for mild pain. 03/10/19   Love, Ivan Anchors, PA-C  NON FORMULARY Diet Type: Regular 03/12/19   [provider]  Nutritional Supplements (ENSURE ENLIVE PO) Take 1 Bottle by mouth daily. Prefers Vanilla 03/17/19   [provider]  polyethylene glycol (MIRALAX / GLYCOLAX) 17 g packet Take 17 g by mouth daily. 03/13/19   [provider]    Physical Exam: Vitals:   05/17/19  1845 05/17/19 1918 05/17/19 1930 05/17/19 2000  BP:  130/80 (!) 144/81 (!) 168/71  Pulse: 86 79 80 72  Resp: 18 20 15  (!) 22  Temp:      TempSrc:      SpO2: 96% 98% 98% 97%  Weight:      Height:        Constitutional: NAD, calm, comfortable Vitals:   05/17/19 1845 05/17/19 1918 05/17/19 1930 05/17/19 2000  BP:  130/80 (!) 144/81 (!) 168/71  Pulse: 86 79 80 72  Resp: 18 20 15  (!) 22  Temp:      TempSrc:      SpO2: 96% 98% 98% 97%  Weight:      Height:       Eyes: PERRL, lids and conjunctivae normal ENMT: Mucous membranes are moist. Posterior pharynx clear of any exudate or lesions. Neck: normal, supple, no masses,  no thyromegaly Respiratory: clear to auscultation bilaterally, no wheezing, no crackles. Normal respiratory effort. No accessory muscle use.  Cardiovascular: Regular rate and rhythm, no murmurs / rubs / gallops. No extremity edema. 2+ pedal pulses.  Abdomen: no tenderness, no masses palpated. No hepatosplenomegaly. Bowel sounds positive.  Musculoskeletal: no clubbing / cyanosis. No joint deformity upper and lower extremities. Good ROM, no contractures. Normal muscle tone.  Skin: no rashes, lesions, ulcers. No induration Neurologic: Would intermittently have difficulty expressing herself when answering questions during my exam otherwise speech is not slurred,, CN 2-12 grossly intact. Sensation intact, Strength 5/5 in all 4.  Psychiatric: Normal judgment and insight. Alert and oriented x 3. Normal mood.   Labs on Admission: I have personally reviewed following labs and imaging studies  CBC: Recent Labs  Lab 05/17/19 1302 05/17/19 1531  WBC 4.6  --   NEUTROABS 3.2  --   HGB 11.9* 12.2  HCT 38.0 36.0  MCV 92.0  --   PLT 190  --    Basic Metabolic Panel: Recent Labs  Lab 05/17/19 1531 05/17/19 1533  NA 141 141  K 4.5 3.9  CL 106 108  CO2  --  23  GLUCOSE 85 91  BUN 11 11  CREATININE 0.50 0.55  CALCIUM  --  9.2   Liver Function Tests: Recent Labs   Lab 05/17/19 1533  AST 20  ALT 10  ALKPHOS 91  BILITOT 0.9  PROT 7.3  ALBUMIN 4.0   Coagulation Profile: Recent Labs  Lab 05/17/19 1538  INR 1.1   CBG: Recent Labs  Lab 05/17/19 1501  GLUCAP 86    Radiological Exams on Admission: Dg Ribs Unilateral W/chest Left  Result Date: 05/17/2019 CLINICAL DATA:  Pain status post fall EXAM: LEFT RIBS AND CHEST - 3+ VIEW COMPARISON:  For Feb 16, 2019 FINDINGS: The heart size is mildly enlarged. Aortic calcifications are noted. There is some mild but stable height loss involving the midthoracic spine. There is no pneumothorax. There is a subacute appearing mildly displaced fracture involving the eighth rib laterally on the left. There is no additional acute displaced fracture or dislocation. IMPRESSION: 1. Subacute mildly displaced fracture involving the eighth rib laterally on the left. 2. No pneumothorax. 3. No definite acute cardiopulmonary process. Electronically Signed   By: Katherine Mantlehristopher  Green M.D.   On: 05/17/2019 19:29   Ct Head Wo Contrast  Result Date: 05/17/2019 CLINICAL DATA:  Difficulty speaking since yesterday patient fell 2 weeks ago. Right facial bruising. EXAM: CT HEAD WITHOUT CONTRAST CT CERVICAL SPINE WITHOUT CONTRAST TECHNIQUE: Multidetector CT imaging of the head and cervical spine was performed following the standard protocol without intravenous contrast. Multiplanar CT image reconstructions of the cervical spine were also generated. COMPARISON:  Cervical spine CT 02/10/2019. FINDINGS: CT HEAD FINDINGS Brain: There is no evidence of acute intracranial hemorrhage, mass lesion, brain edema or extra-axial fluid collection. There is mild atrophy with prominence of the ventricles and subarachnoid spaces. There is patchy low-density in the periventricular white matter which is most likely due to chronic small vessel ischemic changes. There is no CT evidence of acute cortical infarction. Vascular: Intracranial vascular calcifications. No  hyperdense vessel identified. Skull: Negative for fracture or focal lesion. There are postsurgical changes related to craniocervical fusion posteriorly. The hardware is intact. Sinuses/Orbits: The visualized paranasal sinuses and mastoid air cells are clear. No orbital abnormalities are seen. Previous scleral banding. Other: None. CT CERVICAL SPINE FINDINGS Alignment: 7 mm anterolisthesis at C1-2 secondary to a  fracture of the base of the dens. Minimal degenerative anterolisthesis at C7-T1. Skull base and vertebrae: No evidence of acute fracture. Possible partial healing of the dens fracture with unchanged anterior displacement as described. Patient is status post posterior fusion from the occiput through C4. A previously demonstrated screw extending into the spinal canal at C2-3 has been removed. The hardware otherwise appears unchanged. There is limited purchase of the C3 and C4 laminar screws, especially on the left. There is some lucency surrounding the left C4 screw. The posterior arch of C1 has been resected is adequate decompression the craniocervical junction. Soft tissues and spinal canal: No prevertebral fluid or swelling. No visible canal hematoma. Disc levels: There is multilevel cervical spondylosis with disc space narrowing and uncinate spurring. Prominent posterior osteophytes remain at C3-4, contributing to residual central stenosis. The left C4-5 facet joint is ankylosed. Upper chest: Mild biapical scarring. Other: None. IMPRESSION: 1. No acute intracranial findings. 2. No evidence of acute cervical spine fracture, traumatic subluxation or static signs of instability. 3. Stable anterolisthesis at C1-2 secondary to a chronic fracture involving the base of the dens. The craniocervical junction appears adequately decompressed, and there is possible early healing of the fracture. 4. Interval removal of the previously demonstrated C2 laminar screw extending into the spinal canal. The additional hardware  is unchanged with limited osseous purchase of the C3 and C4 laminar screws. Electronically Signed   By: Carey Bullocks M.D.   On: 05/17/2019 13:51   Ct Cervical Spine Wo Contrast  Result Date: 05/17/2019 CLINICAL DATA:  Difficulty speaking since yesterday patient fell 2 weeks ago. Right facial bruising. EXAM: CT HEAD WITHOUT CONTRAST CT CERVICAL SPINE WITHOUT CONTRAST TECHNIQUE: Multidetector CT imaging of the head and cervical spine was performed following the standard protocol without intravenous contrast. Multiplanar CT image reconstructions of the cervical spine were also generated. COMPARISON:  Cervical spine CT 02/10/2019. FINDINGS: CT HEAD FINDINGS Brain: There is no evidence of acute intracranial hemorrhage, mass lesion, brain edema or extra-axial fluid collection. There is mild atrophy with prominence of the ventricles and subarachnoid spaces. There is patchy low-density in the periventricular white matter which is most likely due to chronic small vessel ischemic changes. There is no CT evidence of acute cortical infarction. Vascular: Intracranial vascular calcifications. No hyperdense vessel identified. Skull: Negative for fracture or focal lesion. There are postsurgical changes related to craniocervical fusion posteriorly. The hardware is intact. Sinuses/Orbits: The visualized paranasal sinuses and mastoid air cells are clear. No orbital abnormalities are seen. Previous scleral banding. Other: None. CT CERVICAL SPINE FINDINGS Alignment: 7 mm anterolisthesis at C1-2 secondary to a fracture of the base of the dens. Minimal degenerative anterolisthesis at C7-T1. Skull base and vertebrae: No evidence of acute fracture. Possible partial healing of the dens fracture with unchanged anterior displacement as described. Patient is status post posterior fusion from the occiput through C4. A previously demonstrated screw extending into the spinal canal at C2-3 has been removed. The hardware otherwise appears  unchanged. There is limited purchase of the C3 and C4 laminar screws, especially on the left. There is some lucency surrounding the left C4 screw. The posterior arch of C1 has been resected is adequate decompression the craniocervical junction. Soft tissues and spinal canal: No prevertebral fluid or swelling. No visible canal hematoma. Disc levels: There is multilevel cervical spondylosis with disc space narrowing and uncinate spurring. Prominent posterior osteophytes remain at C3-4, contributing to residual central stenosis. The left C4-5 facet joint is ankylosed. Upper chest:  Mild biapical scarring. Other: None. IMPRESSION: 1. No acute intracranial findings. 2. No evidence of acute cervical spine fracture, traumatic subluxation or static signs of instability. 3. Stable anterolisthesis at C1-2 secondary to a chronic fracture involving the base of the dens. The craniocervical junction appears adequately decompressed, and there is possible early healing of the fracture. 4. Interval removal of the previously demonstrated C2 laminar screw extending into the spinal canal. The additional hardware is unchanged with limited osseous purchase of the C3 and C4 laminar screws. Electronically Signed   By: Carey Bullocks M.D.   On: 05/17/2019 13:51    EKG: Independently reviewed.  Sinus rhythm.  LAFB.  QTc 444.  Nonspecific T wave changes lead III and aVF compared to prior.  Assessment/Plan Principal Problem:   Word finding difficulty   Word finding difficulty- still present.  Possible CVA.  Head CT negative for acute abnormality.  Not on antiplatelet.  Telemetry neurology recommended stroke work-up, aspirin. -Aspirin 325 given, continue 81 mg daily - PT, OT, speech therapy evaluation - Echocardiogram - MRI brain - MRA head and neck - Lipid panel, HgbA1c a.m - Continue home Crestor 5mg  daily - Will consult neurology.  Dizziness and syncopal episodes- Hx of SVT.  Per rehab notes, episodes have occured while  patient is sitting.  EKG shows sinus rhythm, with LAFB, with nonspecific T wave changes in 3 and aVF.  History of stenosis of cervical spine with central cord syndrome., ?  If this is related to patient's syncopal episodes.  Head and cervical CT negative for acute abnormality. - Follow-up MRA neck - Echocardiogram -Please consult cardiology in a.m. -Orthostatic vitals  SVT history-currently in sinus rhythm. -Continue home metoprolol 25 mg daily.  Hx of cervical spine stenosis with myelopathy, central cord syndrome s/p surgery- requiring prolonged rehabilitation. Patient had displaced type II odontoid fracture requiring - C1 laminectomy, C2 ORIF and occipital to C4 PLIF 02/06/19,  Patient subsequently required removal of C2 translaminar screw 5/5 x 20 surgery by Dr. 02/08/19. -Continue home gabapentin.  DVT prophylaxis: Lovenox Code Status: DNI-confirmed with granddaughter-Alexandra on the phone.  Who is patient's primary decision maker.  She has no signed documents indicating HCPOA. Family Communication: Granddaughter-on the phone.  All questions answered. Disposition Plan: Per rounding team Consults called: Neurology.  Consider cardiology consult in a.m. Admission status: Observation, telemetry   Conchita Paris MD [facility]  05/17/2019, 9:50 PM

## 2019-05-17 NOTE — ED Triage Notes (Signed)
Granddaughter reports that yesterday pt had trouble getting words out and unable to finish sentences. Reports fall 2 weeks ago. Pt has bruised area to rt cheek and to lt shoulder.  Said that pt was in kitchen and when she stood up lost balance and fell

## 2019-05-17 NOTE — ED Notes (Signed)
Neuro consult in progress.

## 2019-05-17 NOTE — ED Provider Notes (Signed)
[facility] EMERGENCY DEPARTMENT Provider Note   CSN: [id number] Arrival date & time: 05/17/19  1244    History   Chief Complaint Chief Complaint  Patient presents with   Aphasia    HPI Denise Espinoza is a 85 y.o. female.     Level 5 caveat for potential stroke.  Patient has had trouble with word finding and speech the past 2 days.  Daughter reported an unusual fall 2 weeks ago.  No prodromal illnesses.  Full range of motion of extremities.  No facial drooping, stiff neck, ataxia.     Past Medical History:  Diagnosis Date   Allergic rhinitis    Anosmia    Arthritis    Full dentures    History of diverticulitis    Insomnia    Insomnia    Senile osteoporosis    SOB (shortness of breath)    SVT (supraventricular tachycardia) (HCC)    Varicose vein of leg    left   Wears glasses     Patient Active Problem List   Diagnosis Date Noted   Supraventricular tachycardia (HCC) 03/22/2019   Dyslipidemia 03/22/2019   GERD without esophagitis 03/22/2019   Chronic constipation 03/22/2019   Insomnia disorder 03/22/2019   H/O Cervical  laminectomy 03/13/2019   Acute lower UTI    Acute blood loss anemia    Orthostatic hypotension    History of supraventricular tachycardia    Central cord syndrome (HCC) 02/15/2019   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    Past Surgical History:  Procedure Laterality Date   COLON RESECTION  1989   diverticulitis   COLONOSCOPY     ESOPHAGEAL DILATION  2015   endoscopy   POSTERIOR CERVICAL FUSION/FORAMINOTOMY N/A 02/11/2019   Procedure: POSTERIOR CERVICAL INSTRUMENTATION REMOVAL CERVICAL TWO;  Surgeon: Lisbeth Renshaw, MD;  Location: [facility] OR;  Service: Neurosurgery;  Laterality: N/A;   POSTERIOR CERVICAL LAMINECTOMY N/A 02/06/2019   Procedure: Cervical one LAMINECTOMY, Occiput-Cervical four fusion;  Surgeon: Lisbeth Renshaw, MD;  Location: [facility] OR;  Service: Neurosurgery;  Laterality: N/A;       OB History   No obstetric history on file.      Home Medications    Prior to Admission medications   Medication Sig Start Date End Date Taking? Authorizing Provider  acetaminophen (TYLENOL) 325 MG tablet Take 1-2 tablets (325-650 mg total) by mouth every 4 (four) hours as needed for mild pain. 03/10/19   Love, Evlyn Kanner, PA-C  calcium-vitamin D (OSCAL WITH D) 500-200 MG-UNIT per tablet Take 1 tablet by mouth 2 (two) times daily.     [provider]  Dextran 70-Hypromellose (ARTIFICIAL TEARS) 0.1-0.3 % SOLN Place 1 drop into both eyes 3 (three) times daily as needed. 03/24/19   [provider]  diclofenac sodium (VOLTAREN) 1 % GEL Apply 2 g topically 4 (four) times daily. 04/17/19   Sharee Holster, NP  fludrocortisone (FLORINEF) 0.1 MG tablet Take 1 tablet (0.1 mg total) by mouth daily. 04/17/19   Sharee Holster, NP  gabapentin (NEURONTIN) 300 MG capsule Take 1 capsule (300 mg total) by mouth 3 (three) times daily. 04/17/19   Sharee Holster, NP  metoprolol succinate (TOPROL-XL) 25 MG 24 hr tablet Take 0.5 tablets (12.5 mg total) by mouth daily. 04/17/19   Sharee Holster, NP  NON FORMULARY Diet Type: Regular 03/12/19   [provider]  Nutritional Supplements (ENSURE ENLIVE PO) Take 1 Bottle by mouth daily. Prefers [facility] 03/17/19   [provider]  pantoprazole (PROTONIX) 40 MG tablet Take 1 tablet (40 mg total) by mouth 2 (two) times daily. 04/17/19   Sharee HolsterGreen, Deborah S, NP  polyethylene glycol (MIRALAX / GLYCOLAX) 17 g packet Take 17 g by mouth daily. 03/13/19   [provider]  rosuvastatin (CRESTOR) 5 MG tablet Take 1 tablet (5 mg total) by mouth daily. 04/17/19   Sharee HolsterGreen, Deborah S, NP  sennosides-docusate sodium (SENOKOT-S) 8.6-50 MG tablet Take 2 tablets by mouth 2 (two) times daily as needed for constipation. 03/24/19   [provider]  traZODone (DESYREL) 50 MG tablet Take 0.5 tablets (25 mg total) by mouth at bedtime. 04/17/19   Sharee HolsterGreen, Deborah  S, NP    Family History Family History  Problem Relation Age of Onset   Cancer Mother    High blood pressure Father    Emphysema Brother     Social History Social History   Tobacco Use   Smoking status: Never Smoker   Smokeless tobacco: Never Used  Substance Use Topics   Alcohol use: Yes    Comment: rare   Drug use: No     Allergies   Penicillins and Codeine phosphate [codeine]   Review of Systems Review of Systems  Unable to perform ROS: Acuity of condition     Physical Exam Updated Vital Signs BP (!) 148/86 (BP Location: Right Arm)    Pulse 73    Temp 98 F (36.7 C) (Oral)    Resp 19    Ht 5\' 3"  (1.6 m)    Wt 66.7 kg    SpO2 97%    BMI 26.04 kg/m   Physical Exam Vitals signs and nursing note reviewed.  Constitutional:      Appearance: She is well-developed.  HENT:     Head: Normocephalic and atraumatic.  Eyes:     Conjunctiva/sclera: Conjunctivae normal.  Neck:     Musculoskeletal: Neck supple.  Cardiovascular:     Rate and Rhythm: Normal rate and regular rhythm.  Pulmonary:     Effort: Pulmonary effort is normal.     Breath sounds: Normal breath sounds.  Abdominal:     General: Bowel sounds are normal.     Palpations: Abdomen is soft.  Musculoskeletal: Normal range of motion.  Skin:    General: Skin is warm and dry.  Neurological:     Mental Status: She is alert.     Comments: Able to move arms and legs to command.  Has trouble with word finding and sentence structure.  Psychiatric:        Behavior: Behavior normal.      ED Treatments / Results  Labs (all labs ordered are listed, but only abnormal results are displayed) Labs Reviewed  CBC - Abnormal; Notable for the following components:      Result Value   Hemoglobin 11.9 (*)    All other components within normal limits  POCT I-STAT, CHEM 8 - Abnormal; Notable for the following components:   Calcium, Ion 1.13 (*)    All other components within normal limits  DIFFERENTIAL    COMPREHENSIVE METABOLIC PANEL  GLUCOSE, CAPILLARY  PROTIME-INR  APTT  I-STAT CHEM 8, ED  CBG MONITORING, ED    EKG None  Radiology Ct Head Wo Contrast  Result Date: 05/17/2019 CLINICAL DATA:  Difficulty speaking since yesterday patient fell 2 weeks ago. Right facial bruising. EXAM: CT HEAD WITHOUT CONTRAST CT CERVICAL SPINE WITHOUT CONTRAST TECHNIQUE: Multidetector CT imaging of the head and cervical spine was  performed following the standard protocol without intravenous contrast. Multiplanar CT image reconstructions of the cervical spine were also generated. COMPARISON:  Cervical spine CT 02/10/2019. FINDINGS: CT HEAD FINDINGS Brain: There is no evidence of acute intracranial hemorrhage, mass lesion, brain edema or extra-axial fluid collection. There is mild atrophy with prominence of the ventricles and subarachnoid spaces. There is patchy low-density in the periventricular white matter which is most likely due to chronic small vessel ischemic changes. There is no CT evidence of acute cortical infarction. Vascular: Intracranial vascular calcifications. No hyperdense vessel identified. Skull: Negative for fracture or focal lesion. There are postsurgical changes related to craniocervical fusion posteriorly. The hardware is intact. Sinuses/Orbits: The visualized paranasal sinuses and mastoid air cells are clear. No orbital abnormalities are seen. Previous scleral banding. Other: None. CT CERVICAL SPINE FINDINGS Alignment: 7 mm anterolisthesis at C1-2 secondary to a fracture of the base of the dens. Minimal degenerative anterolisthesis at C7-T1. Skull base and vertebrae: No evidence of acute fracture. Possible partial healing of the dens fracture with unchanged anterior displacement as described. Patient is status post posterior fusion from the occiput through C4. A previously demonstrated screw extending into the spinal canal at C2-3 has been removed. The hardware otherwise appears unchanged. There is  limited purchase of the C3 and C4 laminar screws, especially on the left. There is some lucency surrounding the left C4 screw. The posterior arch of C1 has been resected is adequate decompression the craniocervical junction. Soft tissues and spinal canal: No prevertebral fluid or swelling. No visible canal hematoma. Disc levels: There is multilevel cervical spondylosis with disc space narrowing and uncinate spurring. Prominent posterior osteophytes remain at C3-4, contributing to residual central stenosis. The left C4-5 facet joint is ankylosed. Upper chest: Mild biapical scarring. Other: None. IMPRESSION: 1. No acute intracranial findings. 2. No evidence of acute cervical spine fracture, traumatic subluxation or static signs of instability. 3. Stable anterolisthesis at C1-2 secondary to a chronic fracture involving the base of the dens. The craniocervical junction appears adequately decompressed, and there is possible early healing of the fracture. 4. Interval removal of the previously demonstrated C2 laminar screw extending into the spinal canal. The additional hardware is unchanged with limited osseous purchase of the C3 and C4 laminar screws. Electronically Signed   By: Carey Bullocks M.D.   On: 05/17/2019 13:51   Ct Cervical Spine Wo Contrast  Result Date: 05/17/2019 CLINICAL DATA:  Difficulty speaking since yesterday patient fell 2 weeks ago. Right facial bruising. EXAM: CT HEAD WITHOUT CONTRAST CT CERVICAL SPINE WITHOUT CONTRAST TECHNIQUE: Multidetector CT imaging of the head and cervical spine was performed following the standard protocol without intravenous contrast. Multiplanar CT image reconstructions of the cervical spine were also generated. COMPARISON:  Cervical spine CT 02/10/2019. FINDINGS: CT HEAD FINDINGS Brain: There is no evidence of acute intracranial hemorrhage, mass lesion, brain edema or extra-axial fluid collection. There is mild atrophy with prominence of the ventricles and subarachnoid  spaces. There is patchy low-density in the periventricular white matter which is most likely due to chronic small vessel ischemic changes. There is no CT evidence of acute cortical infarction. Vascular: Intracranial vascular calcifications. No hyperdense vessel identified. Skull: Negative for fracture or focal lesion. There are postsurgical changes related to craniocervical fusion posteriorly. The hardware is intact. Sinuses/Orbits: The visualized paranasal sinuses and mastoid air cells are clear. No orbital abnormalities are seen. Previous scleral banding. Other: None. CT CERVICAL SPINE FINDINGS Alignment: 7 mm anterolisthesis at C1-2 secondary to a fracture  of the base of the dens. Minimal degenerative anterolisthesis at C7-T1. Skull base and vertebrae: No evidence of acute fracture. Possible partial healing of the dens fracture with unchanged anterior displacement as described. Patient is status post posterior fusion from the occiput through C4. A previously demonstrated screw extending into the spinal canal at C2-3 has been removed. The hardware otherwise appears unchanged. There is limited purchase of the C3 and C4 laminar screws, especially on the left. There is some lucency surrounding the left C4 screw. The posterior arch of C1 has been resected is adequate decompression the craniocervical junction. Soft tissues and spinal canal: No prevertebral fluid or swelling. No visible canal hematoma. Disc levels: There is multilevel cervical spondylosis with disc space narrowing and uncinate spurring. Prominent posterior osteophytes remain at C3-4, contributing to residual central stenosis. The left C4-5 facet joint is ankylosed. Upper chest: Mild biapical scarring. Other: None. IMPRESSION: 1. No acute intracranial findings. 2. No evidence of acute cervical spine fracture, traumatic subluxation or static signs of instability. 3. Stable anterolisthesis at C1-2 secondary to a chronic fracture involving the base of the  dens. The craniocervical junction appears adequately decompressed, and there is possible early healing of the fracture. 4. Interval removal of the previously demonstrated C2 laminar screw extending into the spinal canal. The additional hardware is unchanged with limited osseous purchase of the C3 and C4 laminar screws. Electronically Signed   By: Richardean Sale M.D.   On: 05/17/2019 13:51    Procedures Procedures (including critical care time)  Medications Ordered in ED Medications  sodium chloride flush (NS) 0.9 % injection 3 mL (has no administration in time range)     Initial Impression / Assessment and Plan / ED Course  I have reviewed the triage vital signs and the nursing notes.  Pertinent labs & imaging results that were available during my care of the patient were reviewed by me and considered in my medical decision making (see chart for details).        History and physical consistent with CVA.  CT head negative.  Will obtain tele-neurology consult.   CRITICAL CARE Performed by: Nat Christen Total critical care time: 30 minutes Critical care time was exclusive of separately billable procedures and treating other patients. Critical care was necessary to treat or prevent imminent or life-threatening deterioration. Critical care was time spent personally by me on the following activities: development of treatment plan with patient and/or surrogate as well as nursing, discussions with consultants, evaluation of patient's response to treatment, examination of patient, obtaining history from patient or surrogate, ordering and performing treatments and interventions, ordering and review of laboratory studies, ordering and review of radiographic studies, pulse oximetry and re-evaluation of patient's condition.  Final Clinical Impressions(s) / ED Diagnoses   Final diagnoses:  Dysphasia    ED Discharge Orders    None       Nat Christen, MD 05/17/19 1640

## 2019-05-18 ENCOUNTER — Observation Stay (HOSPITAL_BASED_OUTPATIENT_CLINIC_OR_DEPARTMENT_OTHER): Payer: Medicare Other

## 2019-05-18 ENCOUNTER — Observation Stay (HOSPITAL_COMMUNITY): Payer: Medicare Other

## 2019-05-18 DIAGNOSIS — K5909 Other constipation: Secondary | ICD-10-CM

## 2019-05-18 DIAGNOSIS — S14129D Central cord syndrome at unspecified level of cervical spinal cord, subsequent encounter: Secondary | ICD-10-CM

## 2019-05-18 DIAGNOSIS — I951 Orthostatic hypotension: Secondary | ICD-10-CM | POA: Diagnosis present

## 2019-05-18 DIAGNOSIS — R55 Syncope and collapse: Secondary | ICD-10-CM | POA: Diagnosis not present

## 2019-05-18 DIAGNOSIS — S2239XA Fracture of one rib, unspecified side, initial encounter for closed fracture: Secondary | ICD-10-CM | POA: Diagnosis present

## 2019-05-18 DIAGNOSIS — Z885 Allergy status to narcotic agent status: Secondary | ICD-10-CM | POA: Diagnosis not present

## 2019-05-18 DIAGNOSIS — R569 Unspecified convulsions: Secondary | ICD-10-CM

## 2019-05-18 DIAGNOSIS — Z825 Family history of asthma and other chronic lower respiratory diseases: Secondary | ICD-10-CM | POA: Diagnosis not present

## 2019-05-18 DIAGNOSIS — I361 Nonrheumatic tricuspid (valve) insufficiency: Secondary | ICD-10-CM

## 2019-05-18 DIAGNOSIS — K219 Gastro-esophageal reflux disease without esophagitis: Secondary | ICD-10-CM

## 2019-05-18 DIAGNOSIS — I071 Rheumatic tricuspid insufficiency: Secondary | ICD-10-CM | POA: Diagnosis present

## 2019-05-18 DIAGNOSIS — I495 Sick sinus syndrome: Secondary | ICD-10-CM | POA: Diagnosis present

## 2019-05-18 DIAGNOSIS — I351 Nonrheumatic aortic (valve) insufficiency: Secondary | ICD-10-CM

## 2019-05-18 DIAGNOSIS — Y92009 Unspecified place in unspecified non-institutional (private) residence as the place of occurrence of the external cause: Secondary | ICD-10-CM | POA: Diagnosis not present

## 2019-05-18 DIAGNOSIS — Z9889 Other specified postprocedural states: Secondary | ICD-10-CM | POA: Diagnosis not present

## 2019-05-18 DIAGNOSIS — R4702 Dysphasia: Secondary | ICD-10-CM | POA: Diagnosis present

## 2019-05-18 DIAGNOSIS — M1990 Unspecified osteoarthritis, unspecified site: Secondary | ICD-10-CM | POA: Diagnosis present

## 2019-05-18 DIAGNOSIS — Z880 Allergy status to penicillin: Secondary | ICD-10-CM | POA: Diagnosis not present

## 2019-05-18 DIAGNOSIS — I459 Conduction disorder, unspecified: Secondary | ICD-10-CM | POA: Diagnosis not present

## 2019-05-18 DIAGNOSIS — Z981 Arthrodesis status: Secondary | ICD-10-CM | POA: Diagnosis not present

## 2019-05-18 DIAGNOSIS — M810 Age-related osteoporosis without current pathological fracture: Secondary | ICD-10-CM | POA: Diagnosis present

## 2019-05-18 DIAGNOSIS — E785 Hyperlipidemia, unspecified: Secondary | ICD-10-CM | POA: Diagnosis present

## 2019-05-18 DIAGNOSIS — I471 Supraventricular tachycardia: Secondary | ICD-10-CM | POA: Diagnosis present

## 2019-05-18 DIAGNOSIS — R4781 Slurred speech: Secondary | ICD-10-CM | POA: Diagnosis present

## 2019-05-18 DIAGNOSIS — R4789 Other speech disturbances: Secondary | ICD-10-CM | POA: Diagnosis not present

## 2019-05-18 DIAGNOSIS — G92 Toxic encephalopathy: Secondary | ICD-10-CM | POA: Diagnosis present

## 2019-05-18 DIAGNOSIS — Z8679 Personal history of other diseases of the circulatory system: Secondary | ICD-10-CM

## 2019-05-18 DIAGNOSIS — R29702 NIHSS score 2: Secondary | ICD-10-CM | POA: Diagnosis present

## 2019-05-18 DIAGNOSIS — R4701 Aphasia: Secondary | ICD-10-CM | POA: Diagnosis present

## 2019-05-18 DIAGNOSIS — R001 Bradycardia, unspecified: Secondary | ICD-10-CM | POA: Diagnosis not present

## 2019-05-18 DIAGNOSIS — Z20828 Contact with and (suspected) exposure to other viral communicable diseases: Secondary | ICD-10-CM | POA: Diagnosis present

## 2019-05-18 DIAGNOSIS — M4802 Spinal stenosis, cervical region: Secondary | ICD-10-CM | POA: Diagnosis not present

## 2019-05-18 DIAGNOSIS — W19XXXA Unspecified fall, initial encounter: Secondary | ICD-10-CM | POA: Diagnosis present

## 2019-05-18 DIAGNOSIS — Z809 Family history of malignant neoplasm, unspecified: Secondary | ICD-10-CM | POA: Diagnosis not present

## 2019-05-18 DIAGNOSIS — G4700 Insomnia, unspecified: Secondary | ICD-10-CM | POA: Diagnosis present

## 2019-05-18 LAB — CBC WITH DIFFERENTIAL/PLATELET
Abs Immature Granulocytes: 0.01 10*3/uL (ref 0.00–0.07)
Basophils Absolute: 0 10*3/uL (ref 0.0–0.1)
Basophils Relative: 0 %
Eosinophils Absolute: 0.1 10*3/uL (ref 0.0–0.5)
Eosinophils Relative: 2 %
HCT: 37.2 % (ref 36.0–46.0)
Hemoglobin: 11.9 g/dL — ABNORMAL LOW (ref 12.0–15.0)
Immature Granulocytes: 0 %
Lymphocytes Relative: 32 %
Lymphs Abs: 1.2 10*3/uL (ref 0.7–4.0)
MCH: 29.3 pg (ref 26.0–34.0)
MCHC: 32 g/dL (ref 30.0–36.0)
MCV: 91.6 fL (ref 80.0–100.0)
Monocytes Absolute: 0.2 10*3/uL (ref 0.1–1.0)
Monocytes Relative: 6 %
Neutro Abs: 2.2 10*3/uL (ref 1.7–7.7)
Neutrophils Relative %: 60 %
Platelets: 189 10*3/uL (ref 150–400)
RBC: 4.06 MIL/uL (ref 3.87–5.11)
RDW: 13.9 % (ref 11.5–15.5)
WBC: 3.7 10*3/uL — ABNORMAL LOW (ref 4.0–10.5)
nRBC: 0 % (ref 0.0–0.2)

## 2019-05-18 LAB — LIPID PANEL
Cholesterol: 148 mg/dL (ref 0–200)
HDL: 58 mg/dL (ref >40)
LDL Cholesterol: 76 mg/dL (ref 0–99)
Total CHOL/HDL Ratio: 2.6 RATIO
Triglycerides: 70 mg/dL (ref <150)
VLDL: 14 mg/dL (ref 0–40)

## 2019-05-18 LAB — BASIC METABOLIC PANEL
Anion gap: 11 (ref 5–15)
BUN: 12 mg/dL (ref 8–23)
CO2: 24 mmol/L (ref 22–32)
Calcium: 9.2 mg/dL (ref 8.9–10.3)
Chloride: 105 mmol/L (ref 98–111)
Creatinine, Ser: 0.64 mg/dL (ref 0.44–1.00)
GFR calc Af Amer: >60 mL/min (ref >60)
GFR calc non Af Amer: >60 mL/min (ref >60)
Glucose, Bld: 81 mg/dL (ref 70–99)
Potassium: 3.6 mmol/L (ref 3.5–5.1)
Sodium: 140 mmol/L (ref 135–145)

## 2019-05-18 LAB — ECHOCARDIOGRAM COMPLETE
Height: 63 in
Weight: 2256 oz

## 2019-05-18 LAB — HEMOGLOBIN A1C
Hgb A1c MFr Bld: 5.2 % (ref 4.8–5.6)
Mean Plasma Glucose: 102.54 mg/dL

## 2019-05-18 MED ORDER — FLUDROCORTISONE ACETATE 0.1 MG PO TABS
0.1000 mg | ORAL_TABLET | Freq: Every day | ORAL | Status: DC
  Administered 2019-05-18 – 2019-05-19 (×2): 0.1 mg via ORAL
  Filled 2019-05-18 (×2): qty 1

## 2019-05-18 MED ORDER — SENNOSIDES-DOCUSATE SODIUM 8.6-50 MG PO TABS: 2.0000 | ORAL_TABLET | Freq: Two times a day (BID) | ORAL | Status: DC | PRN

## 2019-05-18 MED ORDER — LEVETIRACETAM 500 MG PO TABS
500.0000 mg | ORAL_TABLET | Freq: Two times a day (BID) | ORAL | Status: DC
  Administered 2019-05-18 – 2019-05-24 (×13): 500 mg via ORAL
  Filled 2019-05-18 (×13): qty 1

## 2019-05-18 MED ORDER — POLYETHYLENE GLYCOL 3350 17 G PO PACK
17.0000 g | PACK | Freq: Every day | ORAL | Status: DC
  Administered 2019-05-18 – 2019-05-22 (×5): 17 g via ORAL
  Filled 2019-05-18 (×7): qty 1

## 2019-05-18 MED ORDER — QUETIAPINE FUMARATE 25 MG PO TABS
25.0000 mg | ORAL_TABLET | Freq: Every day | ORAL | Status: DC
  Administered 2019-05-18 – 2019-05-23 (×6): 25 mg via ORAL
  Filled 2019-05-18 (×6): qty 1

## 2019-05-18 MED ORDER — GADOBUTROL 1 MMOL/ML IV SOLN
6.0000 mL | Freq: Once | INTRAVENOUS | Status: AC | PRN
  Administered 2019-05-18: 6 mL via INTRAVENOUS

## 2019-05-18 MED ORDER — SODIUM CHLORIDE 0.9 % IV SOLN
INTRAVENOUS | Status: AC
  Administered 2019-05-18: 19:00:00 via INTRAVENOUS

## 2019-05-18 NOTE — Progress Notes (Addendum)
Brief History 85 y.o. female who presented to [facility] emergency room after her granddaughter brought her for what was initially reported as "persistent" difficulty with speech since Friday last night. However, now that pt is alert and oriented, she actually describes transient spells of speech, confusion and lethargy. She was seen by tele-neurology and had NIHSS of 2. CTH neg. She was transferred to [facility] for MRI brain, which was also neg for stroke. Upon admit, pt was lethargic, confused and not cooperative.  Subjective Pt is now completely A&Ox4 and cooperative. Clear and fluent speech. She gives more insight to HPI. She says that she feels spells coming on and describes it as an aura. She knows that she needs to hold on to something or sit down, but still has had some falls (face injury noted). She can't remember what happens next exactly, but knows she can't communicate. The spells have become more frequent and the time it takes to get back to normal is taking longer per her report. She denies dizziness, CP or palpitations.     Past Medical History Past Medical History:  Diagnosis Date  . Allergic rhinitis   . Anosmia   . Arthritis   . Full dentures   . History of diverticulitis   . Insomnia   . Insomnia   . Senile osteoporosis   . SOB (shortness of breath)   . SVT (supraventricular tachycardia) (HCC)   . Varicose vein of leg    left  . Wears glasses     Past Surgical History Past Surgical History:  Procedure Laterality Date  . COLON RESECTION  1989   diverticulitis  . COLONOSCOPY    . ESOPHAGEAL DILATION  2015   endoscopy  . POSTERIOR CERVICAL FUSION/FORAMINOTOMY N/A 02/11/2019   Procedure: POSTERIOR CERVICAL INSTRUMENTATION REMOVAL CERVICAL TWO;  Surgeon: Lisbeth Renshaw, MD;  Location: [facility] OR;  Service: Neurosurgery;  Laterality: N/A;  . POSTERIOR CERVICAL LAMINECTOMY N/A 02/06/2019   Procedure: Cervical one LAMINECTOMY, Occiput-Cervical four fusion;  Surgeon: Lisbeth Renshaw, MD;  Location: [facility] OR;  Service: Neurosurgery;  Laterality: N/A;    Allergies Allergies  Allergen Reactions  . Penicillins Hives, Swelling, Rash and Other (See Comments)    Did it involve swelling of the face/tongue/throat, SOB, or low BP? #  #  #  YES  #  #  #  Did it involve sudden or severe rash/hives, skin peeling, or any reaction on the inside of your mouth or nose? No Did you need to seek medical attention at a hospital or doctor's office? #  #  #  YES  #  #  #  When did it last happen?85 years old.   . Codeine Phosphate [Codeine] Other (See Comments)    headache    Home Medications Medications Prior to Admission  Medication Sig Dispense Refill  . calcium-vitamin D (OSCAL WITH D) 500-200 MG-UNIT per tablet Take 1 tablet by mouth 2 (two) times daily.     Marland Kitchen Dextran 70-Hypromellose (ARTIFICIAL TEARS) 0.1-0.3 % SOLN Place 1 drop into both eyes 3 (three) times daily as needed.    . diclofenac sodium (VOLTAREN) 1 % GEL Apply 2 g topically 4 (four) times daily. 350 g 0  . fludrocortisone (FLORINEF) 0.1 MG tablet Take 1 tablet (0.1 mg total) by mouth daily. 30 tablet 0  . gabapentin (NEURONTIN) 300 MG capsule Take 1 capsule (300 mg total) by mouth 3 (three) times daily. 90 capsule 0  . metoprolol succinate (TOPROL-XL) 25 MG 24 hr  tablet Take 0.5 tablets (12.5 mg total) by mouth daily. 15 tablet 0  . pantoprazole (PROTONIX) 40 MG tablet Take 1 tablet (40 mg total) by mouth 2 (two) times daily. 60 tablet 0  . rosuvastatin (CRESTOR) 5 MG tablet Take 1 tablet (5 mg total) by mouth daily. 30 tablet 0  . sennosides-docusate sodium (SENOKOT-S) 8.6-50 MG tablet Take 2 tablets by mouth 2 (two) times daily as needed for constipation.    . traZODone (DESYREL) 50 MG tablet Take 0.5 tablets (25 mg total) by mouth at bedtime. 15 tablet 0  . acetaminophen (TYLENOL) 325 MG tablet Take 1-2 tablets (325-650 mg total) by mouth every 4 (four) hours as needed for mild pain.    . NON FORMULARY Diet  Type: Regular    . Nutritional Supplements (ENSURE ENLIVE PO) Take 1 Bottle by mouth daily. Prefers Vanilla    . polyethylene glycol (MIRALAX / GLYCOLAX) 17 g packet Take 17 g by mouth daily.      Hospital Medications . aspirin EC  81 mg Oral Daily  . enoxaparin (LOVENOX) injection  40 mg Subcutaneous Q24H  . gabapentin  300 mg Oral TID  . metoprolol succinate  12.5 mg Oral Daily  . pantoprazole  40 mg Oral BID  . QUEtiapine  25 mg Oral QHS  . rosuvastatin  5 mg Oral Daily     Objective  Intake/Output from previous day: 08/08 0701 - 08/09 0700 In: 3 [I.V.:3] Out: -  Intake/Output this shift: No intake/output data recorded. Nutritional status:  Diet Order            Diet regular Room service appropriate? Yes; Fluid consistency: Thin  Diet effective now               Physical Exam -  Vitals:   05/18/19 0200 05/18/19 0420 05/18/19 0600 05/18/19 0734  BP: (!) 146/70 (!) 162/78 (!) 151/73 (!) 143/74  Pulse:    75  Resp: Temp: 97.8 F (36.6 C) 97.9 F (36.6 C) 97.9 F (36.6 C)   TempSrc: Oral Oral Oral   SpO2: 97% 97% 98%   Weight:      Height:       General - no acute distress. Heart - Regular rate and rhythm - no murmer Lungs - Clear to auscultation Abdomen - Soft - non tender Extremities - Distal pulses intact - no edema Skin - Warm and dry  Neurologic Exam:   Mental Status:  Alert, oriented, thought content appropriate. Speech without evidence of dysarthria or aphasia. Able to follow 3 step commands without difficulty.  Cranial Nerves:  II-bilateral visual fields intact III/IV/VI-Pupils were equal and reacted. Extraocular movements were full.  V/VII-no facial numbness and no facial weakness.  VIII-hearing normal.  X-normal speech and symmetrical palatal movement.  XII-midline tongue extension  Motor: Right : Upper extremity   5/5    Left:     Upper extremity   5/5  Lower extremity   5/5     Lower extremity   5/5 Tone and bulk:normal tone  throughout; no atrophy noted Sensory: Intact to light touch in all extremities. Deep Tendon Reflexes: 2/4 throughout Plantars: Downgoing bilaterally  Cerebellar: Normal finger to nose and heel to shin bilaterally. Gait: not tested    LABORATORY RESULTS:  Basic Metabolic Panel: Recent Labs  Lab 05/17/19 1531 05/17/19 1533  NA 141 141  K 4.5 3.9  CL 106 108  CO2  --  23  GLUCOSE 85  91  BUN 11 11  CREATININE 0.50 0.55  CALCIUM  --  9.2    Liver Function Tests: Recent Labs  Lab 05/17/19 1533  AST 20  ALT 10  ALKPHOS 91  BILITOT 0.9  PROT 7.3  ALBUMIN 4.0   No results for input(s): LIPASE, AMYLASE in the last 168 hours. No results for input(s): AMMONIA in the last 168 hours.  CBC: Recent Labs  Lab 05/17/19 1302 05/17/19 1531  WBC 4.6  --   NEUTROABS 3.2  --   HGB 11.9* 12.2  HCT 38.0 36.0  MCV 92.0  --   PLT 190  --     Cardiac Enzymes: No results for input(s): CKTOTAL, CKMB, CKMBINDEX, TROPONINI in the last 168 hours.  Lipid Panel: Recent Labs  Lab 05/18/19 0530  CHOL 148  TRIG 70  HDL 58  CHOLHDL 2.6  VLDL 14  LDLCALC 76    CBG: Recent Labs  Lab 05/17/19 1501  GLUCAP 86    Microbiology:   Coagulation Studies: Recent Labs    05/17/19 1538  LABPROT 14.1  INR 1.1    Miscellaneous Labs:   IMAGING RESULTS Dg Ribs Unilateral W/chest Left  Result Date: 05/17/2019 CLINICAL DATA:  Pain status post fall EXAM: LEFT RIBS AND CHEST - 3+ VIEW COMPARISON:  For Feb 16, 2019 FINDINGS: The heart size is mildly enlarged. Aortic calcifications are noted. There is some mild but stable height loss involving the midthoracic spine. There is no pneumothorax. There is a subacute appearing mildly displaced fracture involving the eighth rib laterally on the left. There is no additional acute displaced fracture or dislocation. IMPRESSION: 1. Subacute mildly displaced fracture involving the eighth rib laterally on the left. 2. No pneumothorax. 3. No definite  acute cardiopulmonary process. Electronically Signed   By: Katherine Mantle M.D.   On: 05/17/2019 19:29   Ct Head Wo Contrast  Result Date: 05/17/2019 CLINICAL DATA:  Difficulty speaking since yesterday patient fell 2 weeks ago. Right facial bruising. EXAM: CT HEAD WITHOUT CONTRAST CT CERVICAL SPINE WITHOUT CONTRAST TECHNIQUE: Multidetector CT imaging of the head and cervical spine was performed following the standard protocol without intravenous contrast. Multiplanar CT image reconstructions of the cervical spine were also generated. COMPARISON:  Cervical spine CT 02/10/2019. FINDINGS: CT HEAD FINDINGS Brain: There is no evidence of acute intracranial hemorrhage, mass lesion, brain edema or extra-axial fluid collection. There is mild atrophy with prominence of the ventricles and subarachnoid spaces. There is patchy low-density in the periventricular white matter which is most likely due to chronic small vessel ischemic changes. There is no CT evidence of acute cortical infarction. Vascular: Intracranial vascular calcifications. No hyperdense vessel identified. Skull: Negative for fracture or focal lesion. There are postsurgical changes related to craniocervical fusion posteriorly. The hardware is intact. Sinuses/Orbits: The visualized paranasal sinuses and mastoid air cells are clear. No orbital abnormalities are seen. Previous scleral banding. Other: None. CT CERVICAL SPINE FINDINGS Alignment: 7 mm anterolisthesis at C1-2 secondary to a fracture of the base of the dens. Minimal degenerative anterolisthesis at C7-T1. Skull base and vertebrae: No evidence of acute fracture. Possible partial healing of the dens fracture with unchanged anterior displacement as described. Patient is status post posterior fusion from the occiput through C4. A previously demonstrated screw extending into the spinal canal at C2-3 has been removed. The hardware otherwise appears unchanged. There is limited purchase of the C3 and C4  laminar screws, especially on the left. There is some lucency surrounding the left  C4 screw. The posterior arch of C1 has been resected is adequate decompression the craniocervical junction. Soft tissues and spinal canal: No prevertebral fluid or swelling. No visible canal hematoma. Disc levels: There is multilevel cervical spondylosis with disc space narrowing and uncinate spurring. Prominent posterior osteophytes remain at C3-4, contributing to residual central stenosis. The left C4-5 facet joint is ankylosed. Upper chest: Mild biapical scarring. Other: None. IMPRESSION: 1. No acute intracranial findings. 2. No evidence of acute cervical spine fracture, traumatic subluxation or static signs of instability. 3. Stable anterolisthesis at C1-2 secondary to a chronic fracture involving the base of the dens. The craniocervical junction appears adequately decompressed, and there is possible early healing of the fracture. 4. Interval removal of the previously demonstrated C2 laminar screw extending into the spinal canal. The additional hardware is unchanged with limited osseous purchase of the C3 and C4 laminar screws. Electronically Signed   By: Richardean Sale M.D.   On: 05/17/2019 13:51   Ct Cervical Spine Wo Contrast  Result Date: 05/17/2019 CLINICAL DATA:  Difficulty speaking since yesterday patient fell 2 weeks ago. Right facial bruising. EXAM: CT HEAD WITHOUT CONTRAST CT CERVICAL SPINE WITHOUT CONTRAST TECHNIQUE: Multidetector CT imaging of the head and cervical spine was performed following the standard protocol without intravenous contrast. Multiplanar CT image reconstructions of the cervical spine were also generated. COMPARISON:  Cervical spine CT 02/10/2019. FINDINGS: CT HEAD FINDINGS Brain: There is no evidence of acute intracranial hemorrhage, mass lesion, brain edema or extra-axial fluid collection. There is mild atrophy with prominence of the ventricles and subarachnoid spaces. There is patchy  low-density in the periventricular white matter which is most likely due to chronic small vessel ischemic changes. There is no CT evidence of acute cortical infarction. Vascular: Intracranial vascular calcifications. No hyperdense vessel identified. Skull: Negative for fracture or focal lesion. There are postsurgical changes related to craniocervical fusion posteriorly. The hardware is intact. Sinuses/Orbits: The visualized paranasal sinuses and mastoid air cells are clear. No orbital abnormalities are seen. Previous scleral banding. Other: None. CT CERVICAL SPINE FINDINGS Alignment: 7 mm anterolisthesis at C1-2 secondary to a fracture of the base of the dens. Minimal degenerative anterolisthesis at C7-T1. Skull base and vertebrae: No evidence of acute fracture. Possible partial healing of the dens fracture with unchanged anterior displacement as described. Patient is status post posterior fusion from the occiput through C4. A previously demonstrated screw extending into the spinal canal at C2-3 has been removed. The hardware otherwise appears unchanged. There is limited purchase of the C3 and C4 laminar screws, especially on the left. There is some lucency surrounding the left C4 screw. The posterior arch of C1 has been resected is adequate decompression the craniocervical junction. Soft tissues and spinal canal: No prevertebral fluid or swelling. No visible canal hematoma. Disc levels: There is multilevel cervical spondylosis with disc space narrowing and uncinate spurring. Prominent posterior osteophytes remain at C3-4, contributing to residual central stenosis. The left C4-5 facet joint is ankylosed. Upper chest: Mild biapical scarring. Other: None. IMPRESSION: 1. No acute intracranial findings. 2. No evidence of acute cervical spine fracture, traumatic subluxation or static signs of instability. 3. Stable anterolisthesis at C1-2 secondary to a chronic fracture involving the base of the dens. The craniocervical  junction appears adequately decompressed, and there is possible early healing of the fracture. 4. Interval removal of the previously demonstrated C2 laminar screw extending into the spinal canal. The additional hardware is unchanged with limited osseous purchase of the C3 and C4  laminar screws. Electronically Signed   By: Carey BullocksWilliam  Veazey M.D.   On: 05/17/2019 13:51   Mr Angio Head Wo Contrast  Result Date: 05/18/2019 CLINICAL DATA:  Speech difficulties.  Fall 2 weeks ago. EXAM: MR HEAD WITHOUT CONTRAST MR CIRCLE OF WILLIS WITHOUT CONTRAST MRA OF THE NECK WITHOUT AND WITH CONTRAST TECHNIQUE: Multiplanar, multiecho pulse sequences of the brain, circle of willis and surrounding structures were obtained without intravenous contrast. Angiographic images of the neck were obtained using MRA technique without and with intravenous contrast. CONTRAST:  6 mL Gadavist COMPARISON:  Head CT 05/17/2019 FINDINGS: MRI HEAD FINDINGS BRAIN: The midline structures are normal. There is no acute infarct, acute hemorrhage or mass. Early confluent hyperintense T2-weighted signal of the periventricular and deep white matter, most commonly due to chronic ischemic microangiopathy. Generalized atrophy without lobar predilection. Blood-sensitive sequences show no chronic microhemorrhage or superficial siderosis. SKULL AND UPPER CERVICAL SPINE: Posterior craniocervical fusion. SINUSES/ORBITS: No fluid levels or advanced mucosal thickening. No mastoid or middle ear effusion. The orbits are normal. MRA HEAD FINDINGS POSTERIOR CIRCULATION: --Vertebral arteries: Normal V4 segments. --Posterior inferior cerebellar arteries (PICA): Patent origins from the vertebral arteries. --Anterior inferior cerebellar arteries (AICA): Patent origins from the basilar artery. --Basilar artery: Normal. --Superior cerebellar arteries: Normal. --Posterior cerebral arteries (PCA): Normal. The right PCA is predominantly supplied by the posterior communicating artery.  ANTERIOR CIRCULATION: --Intracranial internal carotid arteries: Normal. --Anterior cerebral arteries (ACA): Normal. Both A1 segments are present. Patent anterior communicating artery (a-comm). --Middle cerebral arteries (MCA): Normal. MRA NECK FINDINGS Aortic arch: Normal 3 vessel aortic branching pattern. The visualized subclavian arteries are normal. Right carotid system: Normal course and caliber without stenosis or evidence of dissection. Left carotid system: Normal course and caliber without stenosis or evidence of dissection. Vertebral arteries: Left dominant. Vertebral artery origins are normal. Vertebral arteries are normal in course and caliber to the vertebrobasilar confluence without stenosis or evidence of dissection. IMPRESSION: 1. Chronic ischemic microangiopathy and generalized volume loss without acute intracranial abnormality. 2. Normal cervical and intracranial MRA. Electronically Signed   By: Deatra RobinsonKevin  Herman M.D.   On: 05/18/2019 04:43   Mr Angio Neck W Wo Contrast  Result Date: 05/18/2019 CLINICAL DATA:  Speech difficulties.  Fall 2 weeks ago. EXAM: MR HEAD WITHOUT CONTRAST MR CIRCLE OF WILLIS WITHOUT CONTRAST MRA OF THE NECK WITHOUT AND WITH CONTRAST TECHNIQUE: Multiplanar, multiecho pulse sequences of the brain, circle of willis and surrounding structures were obtained without intravenous contrast. Angiographic images of the neck were obtained using MRA technique without and with intravenous contrast. CONTRAST:  6 mL Gadavist COMPARISON:  Head CT 05/17/2019 FINDINGS: MRI HEAD FINDINGS BRAIN: The midline structures are normal. There is no acute infarct, acute hemorrhage or mass. Early confluent hyperintense T2-weighted signal of the periventricular and deep white matter, most commonly due to chronic ischemic microangiopathy. Generalized atrophy without lobar predilection. Blood-sensitive sequences show no chronic microhemorrhage or superficial siderosis. SKULL AND UPPER CERVICAL SPINE:  Posterior craniocervical fusion. SINUSES/ORBITS: No fluid levels or advanced mucosal thickening. No mastoid or middle ear effusion. The orbits are normal. MRA HEAD FINDINGS POSTERIOR CIRCULATION: --Vertebral arteries: Normal V4 segments. --Posterior inferior cerebellar arteries (PICA): Patent origins from the vertebral arteries. --Anterior inferior cerebellar arteries (AICA): Patent origins from the basilar artery. --Basilar artery: Normal. --Superior cerebellar arteries: Normal. --Posterior cerebral arteries (PCA): Normal. The right PCA is predominantly supplied by the posterior communicating artery. ANTERIOR CIRCULATION: --Intracranial internal carotid arteries: Normal. --Anterior cerebral arteries (ACA): Normal. Both A1 segments are present.  Patent anterior communicating artery (a-comm). --Middle cerebral arteries (MCA): Normal. MRA NECK FINDINGS Aortic arch: Normal 3 vessel aortic branching pattern. The visualized subclavian arteries are normal. Right carotid system: Normal course and caliber without stenosis or evidence of dissection. Left carotid system: Normal course and caliber without stenosis or evidence of dissection. Vertebral arteries: Left dominant. Vertebral artery origins are normal. Vertebral arteries are normal in course and caliber to the vertebrobasilar confluence without stenosis or evidence of dissection. IMPRESSION: 1. Chronic ischemic microangiopathy and generalized volume loss without acute intracranial abnormality. 2. Normal cervical and intracranial MRA. Electronically Signed   By: Deatra RobinsonKevin  Herman M.D.   On: 05/18/2019 04:43   Mr Brain Wo Contrast  Result Date: 05/18/2019 CLINICAL DATA:  Speech difficulties.  Fall 2 weeks ago. EXAM: MR HEAD WITHOUT CONTRAST MR CIRCLE OF WILLIS WITHOUT CONTRAST MRA OF THE NECK WITHOUT AND WITH CONTRAST TECHNIQUE: Multiplanar, multiecho pulse sequences of the brain, circle of willis and surrounding structures were obtained without intravenous contrast.  Angiographic images of the neck were obtained using MRA technique without and with intravenous contrast. CONTRAST:  6 mL Gadavist COMPARISON:  Head CT 05/17/2019 FINDINGS: MRI HEAD FINDINGS BRAIN: The midline structures are normal. There is no acute infarct, acute hemorrhage or mass. Early confluent hyperintense T2-weighted signal of the periventricular and deep white matter, most commonly due to chronic ischemic microangiopathy. Generalized atrophy without lobar predilection. Blood-sensitive sequences show no chronic microhemorrhage or superficial siderosis. SKULL AND UPPER CERVICAL SPINE: Posterior craniocervical fusion. SINUSES/ORBITS: No fluid levels or advanced mucosal thickening. No mastoid or middle ear effusion. The orbits are normal. MRA HEAD FINDINGS POSTERIOR CIRCULATION: --Vertebral arteries: Normal V4 segments. --Posterior inferior cerebellar arteries (PICA): Patent origins from the vertebral arteries. --Anterior inferior cerebellar arteries (AICA): Patent origins from the basilar artery. --Basilar artery: Normal. --Superior cerebellar arteries: Normal. --Posterior cerebral arteries (PCA): Normal. The right PCA is predominantly supplied by the posterior communicating artery. ANTERIOR CIRCULATION: --Intracranial internal carotid arteries: Normal. --Anterior cerebral arteries (ACA): Normal. Both A1 segments are present. Patent anterior communicating artery (a-comm). --Middle cerebral arteries (MCA): Normal. MRA NECK FINDINGS Aortic arch: Normal 3 vessel aortic branching pattern. The visualized subclavian arteries are normal. Right carotid system: Normal course and caliber without stenosis or evidence of dissection. Left carotid system: Normal course and caliber without stenosis or evidence of dissection. Vertebral arteries: Left dominant. Vertebral artery origins are normal. Vertebral arteries are normal in course and caliber to the vertebrobasilar confluence without stenosis or evidence of dissection.  IMPRESSION: 1. Chronic ischemic microangiopathy and generalized volume loss without acute intracranial abnormality. 2. Normal cervical and intracranial MRA. Electronically Signed   By: Deatra RobinsonKevin  Herman M.D.   On: 05/18/2019 04:43     EEG: pending  Assessment/Plan: [age]yr old with past medical history of supraventricular tachycardia, hyperlipidemia, cervical injury presented to [facility] emergency room after her granddaughter brought her for what was initially reported as "persistent" difficulty with speech since Friday last night. However, now that pt is alert and oriented, she describes actually transient spells of speech, confusion and lethargy. She was seen by tele-neurology and had NIHSS of 2. CTH neg. [facility] brain, which was also neg for stroke. Upon admit, pt was lethargic, confused and not cooperative, which I suspect was a post-ictal state which has now cleared  # Transient speech with aura- most likely seizure etiology, will ck EEG and start Keppra as story is quite compelling for this. ddx TIA. MRI neg for stroke. #  Encephalopathy w/confusion and lethergy- c/w post ictal state after transient neurologic spells.  ddx toxic-metabolic, but would not expect to clear this quickly and w/o underlying infection or alteration of labs etc.   Recommendations: EEG keppra 500mg  PO BID Stop home trazodone which can lower seizure threshold Seroquel 25mg  QHS instead if needed for sleep We will f/u on EEG  Desiree Metzger-Cihelka, ARNP-C, ANVP-BC Pager: [phone]   I have seen the patient and reviewed the above note.  She has transient episodes of difficulty with her speech followed by loss of consciousness.  The description sounds more consistent with seizure than syncope, but I do not think we can be certain based on semiology alone.  She is already on an antiepileptic, gabapentin, but I am actually hesitant to increase this in the setting of these spells in case  medication is playing some role. I agree that starting keppra, in part as a diagnostic tool, would be prudent given the frequency of the events. We can start at 500mg  BID.   I also think that syncope continues to be in the differential.  I agree with obtaining some cardiac monitoring, potentially an outpatient prolonged Holter monitor or equivalent.  Ritta Slot, MD [facility] [phone]  If 7pm- 7am, please page neurology on call as listed in AMION.

## 2019-05-18 NOTE — Procedures (Signed)
Patient Name: Denise Espinoza  MRN: [id number]  EEG Attending: Roland Rack  Referring Physician/Provider: [place] Cerise Metzger-Cihelka Date: 05/18/2019 Duration: 25 minutes  Patient history: 85 yo F with transient spells of aphasai  Level of alertness: Awake and asleep  Technical aspects: This EEG study was done with scalp electrodes positioned according to the 10-20 International system of electrode placement. Electrical activity was acquired at a sampling rate of 500Hz  and reviewed with a high frequency filter of 70Hz  and a low frequency filter of 1Hz . EEG data were recorded continuously and digitally stored.   DESCRIPTION   State Description Frequency Location Comments  Awake Posterior dominant rhythm 10 Hz Posterior head regions, symmetric and reactive to eye opening and eye closing.   Sleep Vertex waves  Maximal frontocentral   Sleep Spindles  Maximal frontocentral   Sleep K complexes  Maximal frontocentral    ABNORMALITY: 1. None   IMPRESSION: This study is within normal limits.   No seizures or epileptiform discharges were seen throughout the recording.  Roland Rack, MD [facility] [phone]  If 7pm- 7am, please page neurology on call as listed in [place].

## 2019-05-18 NOTE — Progress Notes (Signed)
Patient arrived to unit [place] bed 4 and assisted to bed by nursing staff.Oriented patient to nurse call bell and educated patient not to get out of bed without assistance from nursing staff.Bed alarm set and yellow bracelet and socks placed on patient.Patient verbalized and demonstrated usage of call bell.No acute distress noted.Will continue to monitor patient.

## 2019-05-18 NOTE — Evaluation (Signed)
Physical Therapy Evaluation Patient Details Name: Denise Espinoza MRN: [id number] DOB: 1930/04/29 Today's Date: 05/18/2019   History of Present Illness  This 85 y.o. female admitted with transient spells of impaired speech, confusion, and lethargy.  MRI of brain was negative for acute infarct.  Current diagnosis; encephalopathy with confusion/and lethargy consistent with post ictal state after transient neurological spell.  Work up underway.  PMH: SVT, cervical cord injury s/p cervical fusion.     Clinical Impression  Pt admitted with above diagnosis. Pt currently with functional limitations due to the deficits listed below (see PT Problem List). PTA, pt at home with son and grandaughtter, mod I with ambulation. Today utilizing RW for balance, reports feeling tired from bedrest. Fall risk at this time will benefit from supervision at home and HHPT.  Pt will benefit from skilled PT to increase their independence and safety with mobility to allow discharge to the venue listed below.       Follow Up Recommendations Home health PT;Supervision/Assistance - 24 hour    Equipment Recommendations  None recommended by PT    Recommendations for Other Services       Precautions / Restrictions Precautions Precautions: Fall      Mobility  Bed Mobility Overal bed mobility: Needs Assistance Bed Mobility: Supine to Sit;Sit to Supine     Supine to sit: Min assist Sit to supine: Min assist   General bed mobility comments: assist to lift trunk and to lift LEs back onto bed   Transfers Overall transfer level: Needs assistance Equipment used: 1 person hand held assist Transfers: Sit to/from [employer] Sit to Stand: Min assist Stand pivot transfers: Min assist       General transfer comment: assist to power up into standing and assist for balance   Ambulation/Gait Ambulation/Gait assistance: Min guard Gait Distance (Feet): 40 Feet Assistive device: Rolling walker (2  wheeled) Gait Pattern/deviations: Step-to pattern Gait velocity: decreased   General Gait Details: slight LOB at times, RW and hands on guarding required, family home per patient can assist. she states she feels this way from laying down too long  Stairs            Wheelchair Mobility    Modified Rankin (Stroke Patients Only)       Balance Overall balance assessment: Needs assistance Sitting-balance support: Feet supported Sitting balance-Leahy Scale: Fair Sitting balance - Comments: able to maintain static sitting with supervision    Standing balance support: Single extremity supported Standing balance-Leahy Scale: Poor Standing balance comment: requires UE support                              Pertinent Vitals/Pain      Home Living Family/patient expects to be discharged to:: Private residence Living Arrangements: Children Available Help at Discharge: Family;Available PRN/intermittently Type of Home: House Home Access: Level entry     Home Layout: Two level;Multi-level;Laundry or work area in [employer] Equipment: Production assistant, radio - single point;Walker - 2 wheels Additional Comments: Pt indicates daughter able to provide 24 hour assist     Prior Function Level of Independence: Independent with assistive device(s)         Comments: Pt reports she ambulates with RW, and is mod I with ADLs.  She reports family assists with financial management, she no longer drives, and she manages her own medications      Hand Dominance   Dominant Hand: Right  Extremity/Trunk Assessment   Upper Extremity Assessment RUE Deficits / Details: pt with significant arthritic deformity of bil. hands.  strength bil. grossly 4/5  RUE Coordination: decreased fine motor LUE Deficits / Details: pt with significant arthritic deformity of bil. hands.  strength bil. grossly 4/5  LUE Coordination: decreased fine motor         Cervical / Trunk Assessment Cervical  / Trunk Assessment: Kyphotic  Communication   Communication: No difficulties(speech is slow and halting at times )  Cognition Arousal/Alertness: Awake/alert Behavior During Therapy: WFL for tasks assessed/performed Overall Cognitive Status: Within Functional Limits for tasks assessed Area of Impairment: Attention;Following commands;Problem solving                   Current Attention Level: Selective   Following Commands: Follows one step commands consistently;Follows multi-step commands inconsistently     Problem Solving: Slow processing;Difficulty sequencing;Requires verbal cues General Comments: Pt is very slow to respond at times.  She required a significant delay to generate "august" as the month.  SBT adminstered, and she scored 14/28 which indicates cognitive impairment.  She struggled with counting backwards, often times getting stuck and having trouble generating the next number - she made 1 error on this portion; she was unable to recite months backwards, and unable to recall info provided.  She was very frustrated by her inability to perform above tasks and indicates this is abnormal for her.       General Comments      Exercises     Assessment/Plan    PT Assessment Patient needs continued PT services  PT Problem List Decreased strength       PT Treatment Interventions      PT Goals (Current goals can be found in the Care Plan section)  Acute Rehab PT Goals Patient Stated Goal: to figure out why I am like this  PT Goal Formulation: With patient Potential to Achieve Goals: Good    Frequency Min 4X/week   Barriers to discharge        Co-evaluation               AM-PAC PT "6 Clicks" Mobility  Outcome Measure Help needed turning from your back to your side while in a flat bed without using bedrails?: None Help needed moving from lying on your back to sitting on the side of a flat bed without using bedrails?: A Little Help needed moving to and from  a bed to a chair (including a wheelchair)?: A Little Help needed standing up from a chair using your arms (e.g., wheelchair or bedside chair)?: A Little Help needed to walk in hospital room?: A Little Help needed climbing 3-5 steps with a railing? : A Lot 6 Click Score: 18    End of Session Equipment Utilized During Treatment: Gait belt Activity Tolerance: Patient tolerated treatment well Patient left: in bed Nurse Communication: Mobility status PT Visit Diagnosis: Unsteadiness on feet (R26.81)    Time: 1710-1735 PT Time Calculation (min) (ACUTE ONLY): 25 min   Charges:   PT Evaluation $PT Eval Moderate Complexity: 1 Mod PT Treatments $Gait Training: 8-22 mins        Reinaldo Berber, PT, DPT Acute Rehabilitation Services Pager: [phone] Office: [phone]   Reinaldo Berber 05/18/2019, 5:35 PM

## 2019-05-18 NOTE — Progress Notes (Signed)
EEG complete - results pending 

## 2019-05-18 NOTE — Progress Notes (Signed)
PROGRESS NOTE    Denise Espinoza  ZOX:[id number] DOB: January 30, 1930 DOA: 05/17/2019 PCP: Tacy Learn, FNP    Brief Narrative:  HPI per Dr. Dondra Spry is a 85 y.o. female with medical history significant for cervical spine stenosis with myelopathy, central cord syndrome, SVT, was brought to the ED by family with reports of difficulty getting her words out and unable to finish sentences that started yesterday.  Patient denies change in her vision, weakness of her extremities, abnormal sensation, facial droop.  She is not on aspirin.  I talked to patient's granddaughter who resides with patient.  She reports this difficulty finding words is sudden very different from her baseline.  Patient was even having difficulty calling her granddaughter's name.  Patient at some point told her granddaughter that she knows what she wants to say she just has difficulty saying it.  At baseline she is very sharp and fluent for age.  Granddaughter denies slurred speech or other focal deficits. Patient also fell 2 weeks ago, she fell from a sitting position at the counter at her house. Fall was unwitnessed, patient was found on the floor.  Patient thinks she might have been out for about 1 to 2 minutes. She has been having dizzy spells.  It was just discharged from [facility] nursing center 04/19/19, for rehabilitation for functional mobility deficits secondary to C2 fracture and associated myelopathy and central cord pattern, after patient had C1 laminectomy, C2 ORIF and occipital to C4 PLIF.  Also reported where syncopal/presyncopal episodes with patient having sudden "dropouts".  Per notes Florinef was to be discontinued.  Consult to cardiology was placed, as she has a history of SVT.  ED Course: Blood pressure systolic 130s -170s.  Head and cervical CT-no acute intracranial abnormalities, No acute cervical spine fracture.  Neurologist consulted, patient outside TPA window.  Aspirin recommended and stroke  work-up.  Hospitalist to admit  Assessment & Plan:   Principal Problem:   Word finding difficulty Active Problems:   Seizures (HCC)   Syncope   Stenosis of cervical spine with myelopathy (HCC)   Central cord syndrome (HCC)   Orthostatic hypotension   History of supraventricular tachycardia   H/O Cervical  laminectomy   Supraventricular tachycardia (HCC)   GERD without esophagitis   Chronic constipation  1 word finding difficulty/loss of consciousness/dizziness/syncope/probable seizure On admission it was noted that patient had presented with some word finding difficulties and syncopal/presyncopal episodes where patient had sudden dropouts.  Patient said has had multiple episodes of these about 15 of them over the past year.  Patient usually describes an aura prior to symptoms.  Head CT which was done was negative for acute CVA.  MRI head which was done was also negative for acute CVA.  MRA head and neck was negative for any acute abnormalities.  Patient seen in consultation by neurology and description per patient is concerning more for seizure activity per neurology.  Patient noted to be on trazodone at home which per neurology needs to be discontinued as it can lower seizure threshold.  EEG has been obtained and is pending.  Patient seen in consultation by neurology who are recommending starting patient on Keppra 500 mg twice daily and also recommending outpatient prolonged Holter monitor or equivalent for further management.  Neurology following and appreciate the input and recommendations.  2.  History of SVT Stable.  Continue home regimen metoprolol.  3.  History of cervical spine stenosis with myelopathy/central cord syndrome status post surgery Requiring prolonged  rehabilitation.  Patient noted to have displaced type II odontoid fracture requiring C1 laminectomy, C2 ORIF and occipital to C4 PLIF on 02/06/2019.  Patient subsequently required removal of C2 translaminar screw on 02/11/2019  per Dr. Conchita ParisNundkumar.  Continue current home regimen of gabapentin.  Outpatient follow-up with neurosurgery.  4.  Orthostatic hypotension Continue gentle hydration.  TED hose.  Resume home regimen of Florinef.  Follow.  5.  Gastroesophageal reflux disease PPI.  6.  Hyperlipidemia Continue Crestor.  7.  Constipation Resume home dose MiraLAX daily.   DVT prophylaxis: Lovenox Code Status: Partial Family Communication: Updated patient.  No family at bedside. Disposition Plan: To be determined.  Likely home with home health when okay with neurology.   Consultants:   Neurology: Dr. Amada JupiterKirkpatrick 05/18/2019    Procedures:   EEG pending 05/18/2019  CT head/CT C-spine 05/17/2019  MRI head MRA head and neck 05/18/2019  2D echo 05/18/2019  Rib films 05/17/2019  Antimicrobials:   None   Subjective: Patient laying in bed.  Patient denies any chest pain or shortness of breath.  Patient does state has had over 15 spells this past year where she has an aura feels very weak lightheaded and dizziness with some presyncopal/syncopal episodes with some associated confusion.  Patient states she is usually passed out for about 1 to 2 minutes and then subsequently recovers.  Patient states she already has urinary incontinence.  Patient denies any tongue biting.  Patient denied any bowel incontinence.  Feeling better today.  No further episodes.  Objective: Vitals:   05/18/19 0734 05/18/19 0930 05/18/19 1244 05/18/19 1646  BP: (!) 143/74 122/74 133/68 (!) 141/62  Pulse: 75 100 81 73  Resp: 18 18 16 16   Temp: 98.2 F (36.8 C) 98.2 F (36.8 C) 98.9 F (37.2 C) 98.9 F (37.2 C)  TempSrc: Oral Oral Oral Oral  SpO2: 98% 98% 98% 94%  Weight:      Height:        Intake/Output Summary (Last 24 hours) at 05/18/2019 1758 Last data filed at 05/18/2019 1700 Gross per 24 hour  Intake 3 ml  Output 400 ml  Net -397 ml   Filed Weights   05/17/19 1303 05/17/19 2250  Weight: 66.7 kg 64 kg     Examination:  General exam: Appears calm and comfortable  Respiratory system: Clear to auscultation. Respiratory effort normal. Cardiovascular system: S1 & S2 heard, RRR. No JVD, murmurs, rubs, gallops or clicks. No pedal edema. Gastrointestinal system: Abdomen is nondistended, soft and nontender. No organomegaly or masses felt. Normal bowel sounds heard. Central nervous system: Alert and oriented. No focal neurological deficits. Extremities: Symmetric 5 x 5 power. Skin: No rashes, lesions or ulcers Psychiatry: Judgement and insight appear normal. Mood & affect appropriate.     Data Reviewed: I have personally reviewed following labs and imaging studies  CBC: Recent Labs  Lab 05/17/19 1302 05/17/19 1531 05/18/19 0906  WBC 4.6  --  3.7*  NEUTROABS 3.2  --  2.2  HGB 11.9* 12.2 11.9*  HCT 38.0 36.0 37.2  MCV 92.0  --  91.6  PLT 190  --  189   Basic Metabolic Panel: Recent Labs  Lab 05/17/19 1531 05/17/19 1533 05/18/19 0906  NA 141 141 140  K 4.5 3.9 3.6  CL 106 108 105  CO2  --  23 24  GLUCOSE 85 91 81  BUN 11 11 12   CREATININE 0.50 0.55 0.64  CALCIUM  --  9.2 9.2   GFR: Estimated  Creatinine Clearance: 44.6 mL/min (by C-G formula based on SCr of 0.64 mg/dL). Liver Function Tests: Recent Labs  Lab 05/17/19 1533  AST 20  ALT 10  ALKPHOS 91  BILITOT 0.9  PROT 7.3  ALBUMIN 4.0   No results for input(s): LIPASE, AMYLASE in the last 168 hours. No results for input(s): AMMONIA in the last 168 hours. Coagulation Profile: Recent Labs  Lab 05/17/19 1538  INR 1.1   Cardiac Enzymes: No results for input(s): CKTOTAL, CKMB, CKMBINDEX, TROPONINI in the last 168 hours. BNP (last 3 results) No results for input(s): PROBNP in the last 8760 hours. HbA1C: Recent Labs    05/18/19 0530  HGBA1C 5.2   CBG: Recent Labs  Lab 05/17/19 1501  GLUCAP 86   Lipid Profile: Recent Labs    05/18/19 0530  CHOL 148  HDL 58  LDLCALC 76  TRIG 70  CHOLHDL 2.6    Thyroid Function Tests: No results for input(s): TSH, T4TOTAL, FREET4, T3FREE, THYROIDAB in the last 72 hours. Anemia Panel: No results for input(s): VITAMINB12, FOLATE, FERRITIN, TIBC, IRON, RETICCTPCT in the last 72 hours. Sepsis Labs: No results for input(s): PROCALCITON, LATICACIDVEN in the last 168 hours.  Recent Results (from the past 240 hour(s))  SARS Coronavirus 2 [facility](Hospital order, Performed in [facility] lab) Nasopharyngeal Nasopharyngeal Swab     Status: None   Collection Time: 05/17/19  6:18 PM   Specimen: Nasopharyngeal Swab  Result Value Ref Range Status   SARS Coronavirus 2 NEGATIVE NEGATIVE Final    Comment: (NOTE) If result is NEGATIVE SARS-CoV-2 target nucleic acids are NOT DETECTED. The SARS-CoV-2 RNA is generally detectable in upper and lower  respiratory specimens during the acute phase of infection. The lowest  concentration of SARS-CoV-2 viral copies this assay can detect is 250  copies / mL. A negative result does not preclude SARS-CoV-2 infection  and should not be used as the sole basis for treatment or other  patient management decisions.  A negative result may occur with  improper specimen collection / handling, submission of specimen other  than nasopharyngeal swab, presence of viral mutation(s) within the  areas targeted by this assay, and inadequate number of viral copies  (<250 copies / mL). A negative result must be combined with clinical  observations, patient history, and epidemiological information. If result is POSITIVE SARS-CoV-2 target nucleic acids are DETECTED. The SARS-CoV-2 RNA is generally detectable in upper and lower  respiratory specimens dur ing the acute phase of infection.  Positive  results are indicative of active infection with SARS-CoV-2.  Clinical  correlation with patient history and other diagnostic information is  necessary to determine patient infection status.  Positive results do  not rule out bacterial  infection or co-infection with other viruses. If result is PRESUMPTIVE POSTIVE SARS-CoV-2 nucleic acids MAY BE PRESENT.   A presumptive positive result was obtained on the submitted specimen  and confirmed on repeat testing.  While 2019 novel coronavirus  (SARS-CoV-2) nucleic acids may be present in the submitted sample  additional confirmatory testing may be necessary for epidemiological  and / or clinical management purposes  to differentiate between  SARS-CoV-2 and other Sarbecovirus currently known to infect humans.  If clinically indicated additional testing with an alternate test  methodology [phone] is advised. The SARS-CoV-2 RNA is generally  detectable in upper and lower respiratory sp ecimens during the acute  phase of infection. The expected result is Negative. Fact Sheet for Patients:  BoilerBrush.com.cyhttps://www.fda.gov/media/136312/download Fact Sheet for Healthcare  Providers: https://pope.com/ This test is not yet approved or cleared by the [place] and has been authorized for detection and/or diagnosis of SARS-CoV-2 by FDA under an Emergency Use Authorization (EUA).  This EUA will remain in effect (meaning this test can be used) for the duration of the COVID-19 declaration under Section 564(b)(1) of the Act, 21 U.S.C. section 360bbb-3(b)(1), unless the authorization is terminated or revoked sooner. Performed at [facility], [address], [place], [place] [address]          Radiology Studies: Dg Ribs Unilateral W/chest Left  Result Date: 05/17/2019 CLINICAL DATA:  Pain status post fall EXAM: LEFT RIBS AND CHEST - 3+ VIEW COMPARISON:  For Feb 16, 2019 FINDINGS: The heart size is mildly enlarged. Aortic calcifications are noted. There is some mild but stable height loss involving the midthoracic spine. There is no pneumothorax. There is a subacute appearing mildly displaced fracture involving the eighth rib laterally on the left. There is no  additional acute displaced fracture or dislocation. IMPRESSION: 1. Subacute mildly displaced fracture involving the eighth rib laterally on the left. 2. No pneumothorax. 3. No definite acute cardiopulmonary process. Electronically Signed   By: Katherine Mantle M.D.   On: 05/17/2019 19:29   Ct Head Wo Contrast  Result Date: 05/17/2019 CLINICAL DATA:  Difficulty speaking since yesterday patient fell 2 weeks ago. Right facial bruising. EXAM: CT HEAD WITHOUT CONTRAST CT CERVICAL SPINE WITHOUT CONTRAST TECHNIQUE: Multidetector CT imaging of the head and cervical spine was performed following the standard protocol without intravenous contrast. Multiplanar CT image reconstructions of the cervical spine were also generated. COMPARISON:  Cervical spine CT 02/10/2019. FINDINGS: CT HEAD FINDINGS Brain: There is no evidence of acute intracranial hemorrhage, mass lesion, brain edema or extra-axial fluid collection. There is mild atrophy with prominence of the ventricles and subarachnoid spaces. There is patchy low-density in the periventricular white matter which is most likely due to chronic small vessel ischemic changes. There is no CT evidence of acute cortical infarction. Vascular: Intracranial vascular calcifications. No hyperdense vessel identified. Skull: Negative for fracture or focal lesion. There are postsurgical changes related to craniocervical fusion posteriorly. The hardware is intact. Sinuses/Orbits: The visualized paranasal sinuses and mastoid air cells are clear. No orbital abnormalities are seen. Previous scleral banding. Other: None. CT CERVICAL SPINE FINDINGS Alignment: 7 mm anterolisthesis at C1-2 secondary to a fracture of the base of the dens. Minimal degenerative anterolisthesis at C7-T1. Skull base and vertebrae: No evidence of acute fracture. Possible partial healing of the dens fracture with unchanged anterior displacement as described. Patient is status post posterior fusion from the occiput  through C4. A previously demonstrated screw extending into the spinal canal at C2-3 has been removed. The hardware otherwise appears unchanged. There is limited purchase of the C3 and C4 laminar screws, especially on the left. There is some lucency surrounding the left C4 screw. The posterior arch of C1 has been resected is adequate decompression the craniocervical junction. Soft tissues and spinal canal: No prevertebral fluid or swelling. No visible canal hematoma. Disc levels: There is multilevel cervical spondylosis with disc space narrowing and uncinate spurring. Prominent posterior osteophytes remain at C3-4, contributing to residual central stenosis. The left C4-5 facet joint is ankylosed. Upper chest: Mild biapical scarring. Other: None. IMPRESSION: 1. No acute intracranial findings. 2. No evidence of acute cervical spine fracture, traumatic subluxation or static signs of instability. 3. Stable anterolisthesis at C1-2 secondary to a chronic fracture involving the base of the  dens. The craniocervical junction appears adequately decompressed, and there is possible early healing of the fracture. 4. Interval removal of the previously demonstrated C2 laminar screw extending into the spinal canal. The additional hardware is unchanged with limited osseous purchase of the C3 and C4 laminar screws. Electronically Signed   By: Carey Bullocks M.D.   On: 05/17/2019 13:51   Ct Cervical Spine Wo Contrast  Result Date: 05/17/2019 CLINICAL DATA:  Difficulty speaking since yesterday patient fell 2 weeks ago. Right facial bruising. EXAM: CT HEAD WITHOUT CONTRAST CT CERVICAL SPINE WITHOUT CONTRAST TECHNIQUE: Multidetector CT imaging of the head and cervical spine was performed following the standard protocol without intravenous contrast. Multiplanar CT image reconstructions of the cervical spine were also generated. COMPARISON:  Cervical spine CT 02/10/2019. FINDINGS: CT HEAD FINDINGS Brain: There is no evidence of acute  intracranial hemorrhage, mass lesion, brain edema or extra-axial fluid collection. There is mild atrophy with prominence of the ventricles and subarachnoid spaces. There is patchy low-density in the periventricular white matter which is most likely due to chronic small vessel ischemic changes. There is no CT evidence of acute cortical infarction. Vascular: Intracranial vascular calcifications. No hyperdense vessel identified. Skull: Negative for fracture or focal lesion. There are postsurgical changes related to craniocervical fusion posteriorly. The hardware is intact. Sinuses/Orbits: The visualized paranasal sinuses and mastoid air cells are clear. No orbital abnormalities are seen. Previous scleral banding. Other: None. CT CERVICAL SPINE FINDINGS Alignment: 7 mm anterolisthesis at C1-2 secondary to a fracture of the base of the dens. Minimal degenerative anterolisthesis at C7-T1. Skull base and vertebrae: No evidence of acute fracture. Possible partial healing of the dens fracture with unchanged anterior displacement as described. Patient is status post posterior fusion from the occiput through C4. A previously demonstrated screw extending into the spinal canal at C2-3 has been removed. The hardware otherwise appears unchanged. There is limited purchase of the C3 and C4 laminar screws, especially on the left. There is some lucency surrounding the left C4 screw. The posterior arch of C1 has been resected is adequate decompression the craniocervical junction. Soft tissues and spinal canal: No prevertebral fluid or swelling. No visible canal hematoma. Disc levels: There is multilevel cervical spondylosis with disc space narrowing and uncinate spurring. Prominent posterior osteophytes remain at C3-4, contributing to residual central stenosis. The left C4-5 facet joint is ankylosed. Upper chest: Mild biapical scarring. Other: None. IMPRESSION: 1. No acute intracranial findings. 2. No evidence of acute cervical spine  fracture, traumatic subluxation or static signs of instability. 3. Stable anterolisthesis at C1-2 secondary to a chronic fracture involving the base of the dens. The craniocervical junction appears adequately decompressed, and there is possible early healing of the fracture. 4. Interval removal of the previously demonstrated C2 laminar screw extending into the spinal canal. The additional hardware is unchanged with limited osseous purchase of the C3 and C4 laminar screws. Electronically Signed   By: Carey Bullocks M.D.   On: 05/17/2019 13:51   Mr Angio Head Wo Contrast  Result Date: 05/18/2019 CLINICAL DATA:  Speech difficulties.  Fall 2 weeks ago. EXAM: MR HEAD WITHOUT CONTRAST MR CIRCLE OF WILLIS WITHOUT CONTRAST MRA OF THE NECK WITHOUT AND WITH CONTRAST TECHNIQUE: Multiplanar, multiecho pulse sequences of the brain, circle of willis and surrounding structures were obtained without intravenous contrast. Angiographic images of the neck were obtained using MRA technique without and with intravenous contrast. CONTRAST:  6 mL Gadavist COMPARISON:  Head CT 05/17/2019 FINDINGS: MRI HEAD FINDINGS BRAIN:  The midline structures are normal. There is no acute infarct, acute hemorrhage or mass. Early confluent hyperintense T2-weighted signal of the periventricular and deep white matter, most commonly due to chronic ischemic microangiopathy. Generalized atrophy without lobar predilection. Blood-sensitive sequences show no chronic microhemorrhage or superficial siderosis. SKULL AND UPPER CERVICAL SPINE: Posterior craniocervical fusion. SINUSES/ORBITS: No fluid levels or advanced mucosal thickening. No mastoid or middle ear effusion. The orbits are normal. MRA HEAD FINDINGS POSTERIOR CIRCULATION: --Vertebral arteries: Normal V4 segments. --Posterior inferior cerebellar arteries (PICA): Patent origins from the vertebral arteries. --Anterior inferior cerebellar arteries (AICA): Patent origins from the basilar artery. --Basilar  artery: Normal. --Superior cerebellar arteries: Normal. --Posterior cerebral arteries (PCA): Normal. The right PCA is predominantly supplied by the posterior communicating artery. ANTERIOR CIRCULATION: --Intracranial internal carotid arteries: Normal. --Anterior cerebral arteries (ACA): Normal. Both A1 segments are present. Patent anterior communicating artery (a-comm). --Middle cerebral arteries (MCA): Normal. MRA NECK FINDINGS Aortic arch: Normal 3 vessel aortic branching pattern. The visualized subclavian arteries are normal. Right carotid system: Normal course and caliber without stenosis or evidence of dissection. Left carotid system: Normal course and caliber without stenosis or evidence of dissection. Vertebral arteries: Left dominant. Vertebral artery origins are normal. Vertebral arteries are normal in course and caliber to the vertebrobasilar confluence without stenosis or evidence of dissection. IMPRESSION: 1. Chronic ischemic microangiopathy and generalized volume loss without acute intracranial abnormality. 2. Normal cervical and intracranial MRA. Electronically Signed   By: Deatra Robinson M.D.   On: 05/18/2019 04:43   Mr Angio Neck W Wo Contrast  Result Date: 05/18/2019 CLINICAL DATA:  Speech difficulties.  Fall 2 weeks ago. EXAM: MR HEAD WITHOUT CONTRAST MR CIRCLE OF WILLIS WITHOUT CONTRAST MRA OF THE NECK WITHOUT AND WITH CONTRAST TECHNIQUE: Multiplanar, multiecho pulse sequences of the brain, circle of willis and surrounding structures were obtained without intravenous contrast. Angiographic images of the neck were obtained using MRA technique without and with intravenous contrast. CONTRAST:  6 mL Gadavist COMPARISON:  Head CT 05/17/2019 FINDINGS: MRI HEAD FINDINGS BRAIN: The midline structures are normal. There is no acute infarct, acute hemorrhage or mass. Early confluent hyperintense T2-weighted signal of the periventricular and deep white matter, most commonly due to chronic ischemic  microangiopathy. Generalized atrophy without lobar predilection. Blood-sensitive sequences show no chronic microhemorrhage or superficial siderosis. SKULL AND UPPER CERVICAL SPINE: Posterior craniocervical fusion. SINUSES/ORBITS: No fluid levels or advanced mucosal thickening. No mastoid or middle ear effusion. The orbits are normal. MRA HEAD FINDINGS POSTERIOR CIRCULATION: --Vertebral arteries: Normal V4 segments. --Posterior inferior cerebellar arteries (PICA): Patent origins from the vertebral arteries. --Anterior inferior cerebellar arteries (AICA): Patent origins from the basilar artery. --Basilar artery: Normal. --Superior cerebellar arteries: Normal. --Posterior cerebral arteries (PCA): Normal. The right PCA is predominantly supplied by the posterior communicating artery. ANTERIOR CIRCULATION: --Intracranial internal carotid arteries: Normal. --Anterior cerebral arteries (ACA): Normal. Both A1 segments are present. Patent anterior communicating artery (a-comm). --Middle cerebral arteries (MCA): Normal. MRA NECK FINDINGS Aortic arch: Normal 3 vessel aortic branching pattern. The visualized subclavian arteries are normal. Right carotid system: Normal course and caliber without stenosis or evidence of dissection. Left carotid system: Normal course and caliber without stenosis or evidence of dissection. Vertebral arteries: Left dominant. Vertebral artery origins are normal. Vertebral arteries are normal in course and caliber to the vertebrobasilar confluence without stenosis or evidence of dissection. IMPRESSION: 1. Chronic ischemic microangiopathy and generalized volume loss without acute intracranial abnormality. 2. Normal cervical and intracranial MRA. Electronically Signed   By: Caryn Bee  Collins Scotland M.D.   On: 05/18/2019 04:43   Mr Brain Wo Contrast  Result Date: 05/18/2019 CLINICAL DATA:  Speech difficulties.  Fall 2 weeks ago. EXAM: MR HEAD WITHOUT CONTRAST MR CIRCLE OF WILLIS WITHOUT CONTRAST MRA OF THE NECK  WITHOUT AND WITH CONTRAST TECHNIQUE: Multiplanar, multiecho pulse sequences of the brain, circle of willis and surrounding structures were obtained without intravenous contrast. Angiographic images of the neck were obtained using MRA technique without and with intravenous contrast. CONTRAST:  6 mL Gadavist COMPARISON:  Head CT 05/17/2019 FINDINGS: MRI HEAD FINDINGS BRAIN: The midline structures are normal. There is no acute infarct, acute hemorrhage or mass. Early confluent hyperintense T2-weighted signal of the periventricular and deep white matter, most commonly due to chronic ischemic microangiopathy. Generalized atrophy without lobar predilection. Blood-sensitive sequences show no chronic microhemorrhage or superficial siderosis. SKULL AND UPPER CERVICAL SPINE: Posterior craniocervical fusion. SINUSES/ORBITS: No fluid levels or advanced mucosal thickening. No mastoid or middle ear effusion. The orbits are normal. MRA HEAD FINDINGS POSTERIOR CIRCULATION: --Vertebral arteries: Normal V4 segments. --Posterior inferior cerebellar arteries (PICA): Patent origins from the vertebral arteries. --Anterior inferior cerebellar arteries (AICA): Patent origins from the basilar artery. --Basilar artery: Normal. --Superior cerebellar arteries: Normal. --Posterior cerebral arteries (PCA): Normal. The right PCA is predominantly supplied by the posterior communicating artery. ANTERIOR CIRCULATION: --Intracranial internal carotid arteries: Normal. --Anterior cerebral arteries (ACA): Normal. Both A1 segments are present. Patent anterior communicating artery (a-comm). --Middle cerebral arteries (MCA): Normal. MRA NECK FINDINGS Aortic arch: Normal 3 vessel aortic branching pattern. The visualized subclavian arteries are normal. Right carotid system: Normal course and caliber without stenosis or evidence of dissection. Left carotid system: Normal course and caliber without stenosis or evidence of dissection. Vertebral arteries: Left  dominant. Vertebral artery origins are normal. Vertebral arteries are normal in course and caliber to the vertebrobasilar confluence without stenosis or evidence of dissection. IMPRESSION: 1. Chronic ischemic microangiopathy and generalized volume loss without acute intracranial abnormality. 2. Normal cervical and intracranial MRA. Electronically Signed   By: Ulyses Jarred M.D.   On: 05/18/2019 04:43        Scheduled Meds:  aspirin EC  81 mg Oral Daily   enoxaparin (LOVENOX) injection  40 mg Subcutaneous Q24H   fludrocortisone  0.1 mg Oral Daily   gabapentin  300 mg Oral TID   levETIRAcetam  500 mg Oral BID   metoprolol succinate  12.5 mg Oral Daily   pantoprazole  40 mg Oral BID   polyethylene glycol  17 g Oral Daily   QUEtiapine  25 mg Oral QHS   rosuvastatin  5 mg Oral Daily   Continuous Infusions:   LOS: 0 days    Time spent: 35 minutes    Irine Seal, MD [facility]  If 7PM-7AM, please contact night-coverage [email or web address] 05/18/2019, 5:58 PM

## 2019-05-18 NOTE — Evaluation (Signed)
Occupational Therapy Evaluation Patient Details Name: Denise Espinoza MRN: [id number] DOB: November 22, 1929 Today's Date: 05/18/2019    History of Present Illness This 85 y.o. female admitted with transient spells of impaired speech, confusion, and lethargy.  MRI of brain was negative for acute infarct.  Current diagnosis; encephalopathy with confusion/and lethargy consistent with post ictal state after transient neurological spell.  Work up underway.  PMH: SVT, cervical cord injury s/p cervical fusion.    Clinical Impression   Pt admitted with above. She demonstrates the below listed deficits and will benefit from continued OT to maximize safety and independence with BADLs.  Pt presents to OT with generalized weakness, decreased balance, decreased activity tolerance, and impaired cognition - Short Blessed Test was administered with pt scoring 14/28 which is indicative of cognitive impairment - she demonstrated deficits with attention, recall, sequencing - pt also requires increased time to process info. Pt indicates current cognitive performance is not her baseline.  Overall, pt requires min - mod A for ADLs, and min A for functional transfers.  IF she has 24 hour assist at discharge, recommend HHOT. l      Follow Up Recommendations  Home health OT;Supervision/Assistance - 24 hour    Equipment Recommendations  None recommended by OT    Recommendations for Other Services       Precautions / Restrictions Precautions Precautions: Fall      Mobility Bed Mobility Overal bed mobility: Needs Assistance Bed Mobility: Supine to Sit;Sit to Supine     Supine to sit: Min assist Sit to supine: Min assist   General bed mobility comments: assist to lift trunk and to lift LEs back onto bed   Transfers Overall transfer level: Needs assistance Equipment used: 1 person hand held assist Transfers: Sit to/from [facility] Sit to Stand: Min assist Stand pivot transfers: Min assist        General transfer comment: assist to power up into standing and assist for balance     Balance Overall balance assessment: Needs assistance Sitting-balance support: Feet supported Sitting balance-Leahy Scale: Fair Sitting balance - Comments: able to maintain static sitting with supervision    Standing balance support: Single extremity supported Standing balance-Leahy Scale: Poor Standing balance comment: requires UE support                            ADL either performed or assessed with clinical judgement   ADL Overall ADL's : Needs assistance/impaired Eating/Feeding: Modified independent;Bed level   Grooming: Wash/dry hands;Wash/dry face;Oral care;Brushing hair;Set up;Sitting   Upper Body Bathing: Minimal assistance;Sitting   Lower Body Bathing: Moderate assistance;Sit to/from stand   Upper Body Dressing : Minimal assistance;Sitting   Lower Body Dressing: Moderate assistance;Sit to/from stand   Toilet Transfer: Minimal assistance;Stand-pivot;Comfort height toilet;RW           Functional mobility during ADLs: Minimal assistance;Rolling walker       Vision Patient Visual Report: No change from baseline       Perception     Praxis      Pertinent Vitals/Pain Pain Assessment: No/denies pain     Hand Dominance Right   Extremity/Trunk Assessment Upper Extremity Assessment Upper Extremity Assessment: Generalized weakness;RUE deficits/detail;LUE deficits/detail RUE Deficits / Details: pt with significant arthritic deformity of bil. hands.  strength bil. grossly 4/5  RUE Coordination: decreased fine motor LUE Deficits / Details: pt with significant arthritic deformity of bil. hands.  strength bil. grossly 4/5  LUE Coordination: decreased  fine motor   Lower Extremity Assessment Lower Extremity Assessment: Defer to PT evaluation   Cervical / Trunk Assessment Cervical / Trunk Assessment: Kyphotic   Communication Communication Communication: No  difficulties(speech is slow and halting at times )   Cognition Arousal/Alertness: Awake/alert Behavior During Therapy: WFL for tasks assessed/performed Overall Cognitive Status: Impaired/Different from baseline Area of Impairment: Attention;Following commands;Problem solving                   Current Attention Level: Selective   Following Commands: Follows one step commands consistently;Follows multi-step commands inconsistently     Problem Solving: Slow processing;Difficulty sequencing;Requires verbal cues General Comments: Pt is very slow to respond at times.  She required a significant delay to generate "august" as the month.  SBT adminstered, and she scored 14/28 which indicates cognitive impairment.  She struggled with counting backwards, often times getting stuck and having trouble generating the next number - she made 1 error on this portion; she was unable to recite months backwards, and unable to recall info provided.  She was very frustrated by her inability to perform above tasks and indicates this is abnormal for her.    General Comments  BP stable     Exercises     Shoulder Instructions      Home Living Family/patient expects to be discharged to:: Private residence Living Arrangements: Children Available Help at Discharge: Family;Available PRN/intermittently Type of Home: House Home Access: Level entry     Home Layout: Two level;Multi-level;Laundry or work area in basement Alternate [employer] of Steps: pt reports there are 2 steps down into dining area, and full flight of stairs to basement.  She does not go downstairs, but instead, goes outside and walks around to the back door    Bathroom Shower/Tub: Producer, television/film/video: Handicapped height     Home Equipment: Shower seat;Cane - single point;Walker - 2 wheels   Additional Comments: Pt indicates daughter able to provide 24 hour assist       Prior Functioning/Environment Level of  Independence: Independent with assistive device(s)        Comments: Pt reports she ambulates with RW, and is mod I with ADLs.  She reports family assists with financial management, she no longer drives, and she manages her own medications         OT Problem List: Decreased strength;Decreased activity tolerance;Impaired balance (sitting and/or standing);Decreased cognition;Decreased coordination;Impaired UE functional use      OT Treatment/Interventions: Self-care/ADL training;DME and/or AE instruction;Therapeutic activities;Cognitive remediation/compensation;Patient/family education;Balance training    OT Goals(Current goals can be found in the care plan section) Acute Rehab OT Goals Patient Stated Goal: to figure out why I am like this  OT Goal Formulation: With patient Time For Goal Achievement: 06/01/19 Potential to Achieve Goals: Good ADL Goals Pt Will Perform Grooming: with min guard assist;standing Pt Will Perform Upper Body Bathing: with set-up;sitting Pt Will Perform Lower Body Bathing: with min guard assist;with adaptive equipment;sit to/from stand Pt Will Perform Upper Body Dressing: with supervision;sitting Pt Will Perform Lower Body Dressing: with min guard assist;with adaptive equipment;sit to/from stand Pt Will Transfer to Toilet: with min guard assist;ambulating;grab bars;regular height toilet;bedside commode Pt Will Perform Toileting - Clothing Manipulation and hygiene: with min guard assist;sit to/from stand  OT Frequency: Min 2X/week   Barriers to D/C:            Co-evaluation              AM-PAC OT "  6 Clicks" Daily Activity     Outcome Measure Help from another person eating meals?: None Help from another person taking care of personal grooming?: A Little Help from another person toileting, which includes using toliet, bedpan, or urinal?: A Little Help from another person bathing (including washing, rinsing, drying)?: A Lot Help from another person to  put on and taking off regular upper body clothing?: A Little Help from another person to put on and taking off regular lower body clothing?: A Lot 6 Click Score: 17   End of Session Nurse Communication: Mobility status  Activity Tolerance: Patient tolerated treatment well Patient left: in bed;with call bell/phone within reach;with nursing/sitter in room  OT Visit Diagnosis: Unsteadiness on feet (R26.81);Cognitive communication deficit (R41.841)                Time: 7793-9030 OT Time Calculation (min): 27 min Charges:  OT General Charges $OT Visit: 1 Visit OT Evaluation $OT Eval Moderate Complexity: 1 Mod OT Treatments $Self Care/Home Management : 8-22 mins  Jeani Hawking, OTR/L Acute Rehabilitation Services Pager [phone] Office [phone]   Jeani Hawking M 05/18/2019, 12:54 PM

## 2019-05-18 NOTE — Progress Notes (Signed)
Paged Dr.Aroor to notify of patient arrival to unit.

## 2019-05-18 NOTE — Evaluation (Signed)
Speech Language Pathology Evaluation Patient Details Name: Denise Espinoza MRN: [id number] DOB: 02-06-1930 Today's Date: 05/18/2019 Time: 0354-6568 SLP Time Calculation (min) (ACUTE ONLY): 18 min  Problem List:  Patient Active Problem List   Diagnosis Date Noted  . Word finding difficulty 05/17/2019  . Supraventricular tachycardia ([place]) 03/22/2019  . Dyslipidemia 03/22/2019  . GERD without esophagitis 03/22/2019  . Chronic constipation 03/22/2019  . Insomnia disorder 03/22/2019  . H/O Cervical  laminectomy 03/13/2019  . Acute lower UTI   . Acute blood loss anemia   . Orthostatic hypotension   . History of supraventricular tachycardia   . Central cord syndrome ([place]) 02/15/2019  . Stenosis of cervical spine with myelopathy ([place]) 02/04/2019   Past Medical History:  Past Medical History:  Diagnosis Date  . Allergic rhinitis   . Anosmia   . Arthritis   . Full dentures   . History of diverticulitis   . Insomnia   . Insomnia   . Senile osteoporosis   . SOB (shortness of breath)   . SVT (supraventricular tachycardia) ([place])   . Varicose vein of leg    left  . Wears glasses    Past Surgical History:  Past Surgical History:  Procedure Laterality Date  . COLON RESECTION  1989   diverticulitis  . COLONOSCOPY    . ESOPHAGEAL DILATION  2015   endoscopy  . POSTERIOR CERVICAL FUSION/FORAMINOTOMY N/A 02/11/2019   Procedure: POSTERIOR CERVICAL INSTRUMENTATION REMOVAL CERVICAL TWO;  Surgeon: Consuella Lose, MD;  Location: [place];  Service: Neurosurgery;  Laterality: N/A;  . POSTERIOR CERVICAL LAMINECTOMY N/A 02/06/2019   Procedure: Cervical one LAMINECTOMY, [place]-Cervical four fusion;  Surgeon: Consuella Lose, MD;  Location: [place];  Service: Neurosurgery;  Laterality: N/A;   HPI:  85 year old female transferred from [facility] where she was originally brought by grandaughter for transient spells of confusion, slurred speech, and lethargy. MRI negative for stroke. Upon admission,  patient lethargic and non cooperative. Per MD notes from this am, patient now alert and oriented and cooperative and clear and fluent speech. Describes spells coming on that present as aura. Per Neuro, likely seizure etiology with post ictal state to account for confusion/lethargy.    Assessment / Plan / Recommendation Clinical Impression  Cognitive-linguistic evaluation complete. During this evaluation, patient presenting WFL for all areas assessed. Patient alert and oriented, able to follow multi-step directions, communicate all needs and wants, and demonstrates appropriate problem solving skills and awareness. Noted that patient with deficits during OT evaluation and patient does report word finding difficulty and confusion during "spells." If patient is having seizures, then these are likely to account for temporary changes in communication/mentation. As long as symptoms resolve, do not see need for SLP f/u. If deficits should remain on different origin of deficits is noted, please re-consult.     SLP Assessment  SLP Recommendation/Assessment: Patient does not need any further Speech Lanaguage Pathology Services SLP Visit Diagnosis: Cognitive communication deficit (R41.841)    Follow Up Recommendations  None          SLP Evaluation Cognition  Overall Cognitive Status: Within Functional Limits for tasks assessed       Comprehension  Auditory Comprehension Overall Auditory Comprehension: Appears within functional limits for tasks assessed Visual Recognition/Discrimination Discrimination: Within Function Limits Reading Comprehension Reading Status: Within funtional limits    Expression Expression Primary Mode of Expression: Verbal Verbal Expression Overall Verbal Expression: Appears within functional limits for tasks assessed Written Expression Dominant Hand: Right   Oral /  Motor  Oral Motor/Sensory Function Overall Oral Motor/Sensory Function: Within functional limits Motor  Speech Overall Motor Speech: Appears within functional limits for tasks assessed                      Ferdinand Lango MA, CCC-SLP   Cedric Denison Meryl 05/18/2019, 1:25 PM

## 2019-05-18 NOTE — Procedures (Signed)
Echo attempted. EEG in progress. Will attempt again later. 

## 2019-05-18 NOTE — Consult Note (Signed)
Requesting Physician: Dr. Valerie Salts    Chief Complaint: Difficulty with language  History obtained from: Patient and Chart    HPI:                                                                                                                                       Denise Espinoza is a 85 y.o. female with past medical history of supraventricular tachycardia, hyperlipidemia, cervical injury presented to [facility] emergency room after her granddaughter brought her for persistent difficulty with speech since Friday last night.  Patient had a fall few weeks ago.  Patient was evaluated by tele -neurology and she scored a 2 on the NIH stroke scale, mostly for language as well as reduced sensation.  CT head did not show an acute stroke.  Patient was admitted due to concern patient she had a stroke missed on initial CT.  As MRIs are not available patient was transferred to [facility] to obtain MRI brain as well as further recommendations.  Currently on assessment patient does not appear to have any clear language deficit.  She is very somnolent and not very cooperative on exam and history.    Past Medical History:  Diagnosis Date  . Allergic rhinitis   . Anosmia   . Arthritis   . Full dentures   . History of diverticulitis   . Insomnia   . Insomnia   . Senile osteoporosis   . SOB (shortness of breath)   . SVT (supraventricular tachycardia) (HCC)   . Varicose vein of leg    left  . Wears glasses     Past Surgical History:  Procedure Laterality Date  . COLON RESECTION  1989   diverticulitis  . COLONOSCOPY    . ESOPHAGEAL DILATION  2015   endoscopy  . POSTERIOR CERVICAL FUSION/FORAMINOTOMY N/A 02/11/2019   Procedure: POSTERIOR CERVICAL INSTRUMENTATION REMOVAL CERVICAL TWO;  Surgeon: Lisbeth Renshaw, MD;  Location: [facility] OR;  Service: Neurosurgery;  Laterality: N/A;  . POSTERIOR CERVICAL LAMINECTOMY N/A 02/06/2019   Procedure: Cervical one LAMINECTOMY, Occiput-Cervical four fusion;  Surgeon:  Lisbeth Renshaw, MD;  Location: [facility] OR;  Service: Neurosurgery;  Laterality: N/A;    Family History  Problem Relation Age of Onset  . Cancer Mother   . High blood pressure Father   . Emphysema Brother    Social History:  reports that she has never smoked. She has never used smokeless tobacco. She reports current alcohol use. She reports that she does not use drugs.  Allergies:  Allergies  Allergen Reactions  . Penicillins Hives, Swelling, Rash and Other (See Comments)    Did it involve swelling of the face/tongue/throat, SOB, or low BP? #  #  #  YES  #  #  #  Did it involve sudden or severe rash/hives, skin peeling, or any reaction on the inside of your mouth or nose? No Did you need to seek  medical attention at a hospital or doctor's office? #  #  #  YES  #  #  #  When did it last happen?85 years old.   . Codeine Phosphate [Codeine] Other (See Comments)    headache    Medications:                                                                                                                        I reviewed home medications   ROS:                                                                                                                                     14 systems reviewed and negative except above    Examination:                                                                                                      General: Appears well-developed and well-nourished.  Psych: Appropriate to the current situation Eyes: No scleral injection HENT: No OP obstrucion Head: Normocephalic.  Cardiovascular: Normal rate and regular rhythm.  Respiratory: Effort normal and breath sounds normal to anterior ascultation GI: Soft.  No distension. There is no tenderness.  Skin: WDI    Neurological Examination Mental Status: The patient is somnolent but arousable.  Follows commands intermittently.  Answers questions in sentences and then drifts back to sleep.  No clear  evidence of aphasia.  Patient is able to repeat sentences Cranial Nerves: II: Visual fields grossly normal: Limited as patient would keep her eyes closed for the most part of the exam. III,IV, VI: ptosis not present, extra-ocular motions intact bilaterally, pupils equal, round, reactive to light and accommodation V,VII: smile symmetric, facial light touch sensation normal bilaterally VIII: hearing normal bilaterally XII: midline tongue extension Motor: Right : Upper extremity   5/5    Left:     Upper extremity   5/5  Lower extremity   4+/5     Lower extremity  4+/5 Tone and bulk:normal tone throughout; no atrophy noted Sensory: Pinprick and light touch intact throughout, bilaterally Plantars: Right: downgoing   Left: downgoing Cerebellar: Not assessed, patient did not fully cooperate     Lab Results: Basic Metabolic Panel: Recent Labs  Lab 05/17/19 1531 05/17/19 1533  NA 141 141  K 4.5 3.9  CL 106 108  CO2  --  23  GLUCOSE 85 91  BUN 11 11  CREATININE 0.50 0.55  CALCIUM  --  9.2    CBC: Recent Labs  Lab 05/17/19 1302 05/17/19 1531  WBC 4.6  --   NEUTROABS 3.2  --   HGB 11.9* 12.2  HCT 38.0 36.0  MCV 92.0  --   PLT 190  --     Coagulation Studies: Recent Labs    05/17/19 1538  LABPROT 14.1  INR 1.1    Imaging: Dg Ribs Unilateral W/chest Left  Result Date: 05/17/2019 CLINICAL DATA:  Pain status post fall EXAM: LEFT RIBS AND CHEST - 3+ VIEW COMPARISON:  For Feb 16, 2019 FINDINGS: The heart size is mildly enlarged. Aortic calcifications are noted. There is some mild but stable height loss involving the midthoracic spine. There is no pneumothorax. There is a subacute appearing mildly displaced fracture involving the eighth rib laterally on the left. There is no additional acute displaced fracture or dislocation. IMPRESSION: 1. Subacute mildly displaced fracture involving the eighth rib laterally on the left. 2. No pneumothorax. 3. No definite acute cardiopulmonary  process. Electronically Signed   By: Katherine Mantle M.D.   On: 05/17/2019 19:29   Ct Head Wo Contrast  Result Date: 05/17/2019 CLINICAL DATA:  Difficulty speaking since yesterday patient fell 2 weeks ago. Right facial bruising. EXAM: CT HEAD WITHOUT CONTRAST CT CERVICAL SPINE WITHOUT CONTRAST TECHNIQUE: Multidetector CT imaging of the head and cervical spine was performed following the standard protocol without intravenous contrast. Multiplanar CT image reconstructions of the cervical spine were also generated. COMPARISON:  Cervical spine CT 02/10/2019. FINDINGS: CT HEAD FINDINGS Brain: There is no evidence of acute intracranial hemorrhage, mass lesion, brain edema or extra-axial fluid collection. There is mild atrophy with prominence of the ventricles and subarachnoid spaces. There is patchy low-density in the periventricular white matter which is most likely due to chronic small vessel ischemic changes. There is no CT evidence of acute cortical infarction. Vascular: Intracranial vascular calcifications. No hyperdense vessel identified. Skull: Negative for fracture or focal lesion. There are postsurgical changes related to craniocervical fusion posteriorly. The hardware is intact. Sinuses/Orbits: The visualized paranasal sinuses and mastoid air cells are clear. No orbital abnormalities are seen. Previous scleral banding. Other: None. CT CERVICAL SPINE FINDINGS Alignment: 7 mm anterolisthesis at C1-2 secondary to a fracture of the base of the dens. Minimal degenerative anterolisthesis at C7-T1. Skull base and vertebrae: No evidence of acute fracture. Possible partial healing of the dens fracture with unchanged anterior displacement as described. Patient is status post posterior fusion from the occiput through C4. A previously demonstrated screw extending into the spinal canal at C2-3 has been removed. The hardware otherwise appears unchanged. There is limited purchase of the C3 and C4 laminar screws,  especially on the left. There is some lucency surrounding the left C4 screw. The posterior arch of C1 has been resected is adequate decompression the craniocervical junction. Soft tissues and spinal canal: No prevertebral fluid or swelling. No visible canal hematoma. Disc levels: There is multilevel cervical spondylosis with disc space narrowing and uncinate spurring. Prominent posterior osteophytes  remain at C3-4, contributing to residual central stenosis. The left C4-5 facet joint is ankylosed. Upper chest: Mild biapical scarring. Other: None. IMPRESSION: 1. No acute intracranial findings. 2. No evidence of acute cervical spine fracture, traumatic subluxation or static signs of instability. 3. Stable anterolisthesis at C1-2 secondary to a chronic fracture involving the base of the dens. The craniocervical junction appears adequately decompressed, and there is possible early healing of the fracture. 4. Interval removal of the previously demonstrated C2 laminar screw extending into the spinal canal. The additional hardware is unchanged with limited osseous purchase of the C3 and C4 laminar screws. Electronically Signed   By: Richardean Sale M.D.   On: 05/17/2019 13:51   Ct Cervical Spine Wo Contrast  Result Date: 05/17/2019 CLINICAL DATA:  Difficulty speaking since yesterday patient fell 2 weeks ago. Right facial bruising. EXAM: CT HEAD WITHOUT CONTRAST CT CERVICAL SPINE WITHOUT CONTRAST TECHNIQUE: Multidetector CT imaging of the head and cervical spine was performed following the standard protocol without intravenous contrast. Multiplanar CT image reconstructions of the cervical spine were also generated. COMPARISON:  Cervical spine CT 02/10/2019. FINDINGS: CT HEAD FINDINGS Brain: There is no evidence of acute intracranial hemorrhage, mass lesion, brain edema or extra-axial fluid collection. There is mild atrophy with prominence of the ventricles and subarachnoid spaces. There is patchy low-density in the  periventricular white matter which is most likely due to chronic small vessel ischemic changes. There is no CT evidence of acute cortical infarction. Vascular: Intracranial vascular calcifications. No hyperdense vessel identified. Skull: Negative for fracture or focal lesion. There are postsurgical changes related to craniocervical fusion posteriorly. The hardware is intact. Sinuses/Orbits: The visualized paranasal sinuses and mastoid air cells are clear. No orbital abnormalities are seen. Previous scleral banding. Other: None. CT CERVICAL SPINE FINDINGS Alignment: 7 mm anterolisthesis at C1-2 secondary to a fracture of the base of the dens. Minimal degenerative anterolisthesis at C7-T1. Skull base and vertebrae: No evidence of acute fracture. Possible partial healing of the dens fracture with unchanged anterior displacement as described. Patient is status post posterior fusion from the occiput through C4. A previously demonstrated screw extending into the spinal canal at C2-3 has been removed. The hardware otherwise appears unchanged. There is limited purchase of the C3 and C4 laminar screws, especially on the left. There is some lucency surrounding the left C4 screw. The posterior arch of C1 has been resected is adequate decompression the craniocervical junction. Soft tissues and spinal canal: No prevertebral fluid or swelling. No visible canal hematoma. Disc levels: There is multilevel cervical spondylosis with disc space narrowing and uncinate spurring. Prominent posterior osteophytes remain at C3-4, contributing to residual central stenosis. The left C4-5 facet joint is ankylosed. Upper chest: Mild biapical scarring. Other: None. IMPRESSION: 1. No acute intracranial findings. 2. No evidence of acute cervical spine fracture, traumatic subluxation or static signs of instability. 3. Stable anterolisthesis at C1-2 secondary to a chronic fracture involving the base of the dens. The craniocervical junction appears  adequately decompressed, and there is possible early healing of the fracture. 4. Interval removal of the previously demonstrated C2 laminar screw extending into the spinal canal. The additional hardware is unchanged with limited osseous purchase of the C3 and C4 laminar screws. Electronically Signed   By: Richardean Sale M.D.   On: 05/17/2019 13:51   Mr Angio Head Wo Contrast  Result Date: 05/18/2019 CLINICAL DATA:  Speech difficulties.  Fall 2 weeks ago. EXAM: MR HEAD WITHOUT CONTRAST MR CIRCLE OF WILLIS WITHOUT  CONTRAST MRA OF THE NECK WITHOUT AND WITH CONTRAST TECHNIQUE: Multiplanar, multiecho pulse sequences of the brain, circle of willis and surrounding structures were obtained without intravenous contrast. Angiographic images of the neck were obtained using MRA technique without and with intravenous contrast. CONTRAST:  6 mL Gadavist COMPARISON:  Head CT 05/17/2019 FINDINGS: MRI HEAD FINDINGS BRAIN: The midline structures are normal. There is no acute infarct, acute hemorrhage or mass. Early confluent hyperintense T2-weighted signal of the periventricular and deep white matter, most commonly due to chronic ischemic microangiopathy. Generalized atrophy without lobar predilection. Blood-sensitive sequences show no chronic microhemorrhage or superficial siderosis. SKULL AND UPPER CERVICAL SPINE: Posterior craniocervical fusion. SINUSES/ORBITS: No fluid levels or advanced mucosal thickening. No mastoid or middle ear effusion. The orbits are normal. MRA HEAD FINDINGS POSTERIOR CIRCULATION: --Vertebral arteries: Normal V4 segments. --Posterior inferior cerebellar arteries (PICA): Patent origins from the vertebral arteries. --Anterior inferior cerebellar arteries (AICA): Patent origins from the basilar artery. --Basilar artery: Normal. --Superior cerebellar arteries: Normal. --Posterior cerebral arteries (PCA): Normal. The right PCA is predominantly supplied by the posterior communicating artery. ANTERIOR  CIRCULATION: --Intracranial internal carotid arteries: Normal. --Anterior cerebral arteries (ACA): Normal. Both A1 segments are present. Patent anterior communicating artery (a-comm). --Middle cerebral arteries (MCA): Normal. MRA NECK FINDINGS Aortic arch: Normal 3 vessel aortic branching pattern. The visualized subclavian arteries are normal. Right carotid system: Normal course and caliber without stenosis or evidence of dissection. Left carotid system: Normal course and caliber without stenosis or evidence of dissection. Vertebral arteries: Left dominant. Vertebral artery origins are normal. Vertebral arteries are normal in course and caliber to the vertebrobasilar confluence without stenosis or evidence of dissection. IMPRESSION: 1. Chronic ischemic microangiopathy and generalized volume loss without acute intracranial abnormality. 2. Normal cervical and intracranial MRA. Electronically Signed   By: Deatra Robinson M.D.   On: 05/18/2019 04:43   Mr Angio Neck W Wo Contrast  Result Date: 05/18/2019 CLINICAL DATA:  Speech difficulties.  Fall 2 weeks ago. EXAM: MR HEAD WITHOUT CONTRAST MR CIRCLE OF WILLIS WITHOUT CONTRAST MRA OF THE NECK WITHOUT AND WITH CONTRAST TECHNIQUE: Multiplanar, multiecho pulse sequences of the brain, circle of willis and surrounding structures were obtained without intravenous contrast. Angiographic images of the neck were obtained using MRA technique without and with intravenous contrast. CONTRAST:  6 mL Gadavist COMPARISON:  Head CT 05/17/2019 FINDINGS: MRI HEAD FINDINGS BRAIN: The midline structures are normal. There is no acute infarct, acute hemorrhage or mass. Early confluent hyperintense T2-weighted signal of the periventricular and deep white matter, most commonly due to chronic ischemic microangiopathy. Generalized atrophy without lobar predilection. Blood-sensitive sequences show no chronic microhemorrhage or superficial siderosis. SKULL AND UPPER CERVICAL SPINE: Posterior  craniocervical fusion. SINUSES/ORBITS: No fluid levels or advanced mucosal thickening. No mastoid or middle ear effusion. The orbits are normal. MRA HEAD FINDINGS POSTERIOR CIRCULATION: --Vertebral arteries: Normal V4 segments. --Posterior inferior cerebellar arteries (PICA): Patent origins from the vertebral arteries. --Anterior inferior cerebellar arteries (AICA): Patent origins from the basilar artery. --Basilar artery: Normal. --Superior cerebellar arteries: Normal. --Posterior cerebral arteries (PCA): Normal. The right PCA is predominantly supplied by the posterior communicating artery. ANTERIOR CIRCULATION: --Intracranial internal carotid arteries: Normal. --Anterior cerebral arteries (ACA): Normal. Both A1 segments are present. Patent anterior communicating artery (a-comm). --Middle cerebral arteries (MCA): Normal. MRA NECK FINDINGS Aortic arch: Normal 3 vessel aortic branching pattern. The visualized subclavian arteries are normal. Right carotid system: Normal course and caliber without stenosis or evidence of dissection. Left carotid system: Normal course and caliber  without stenosis or evidence of dissection. Vertebral arteries: Left dominant. Vertebral artery origins are normal. Vertebral arteries are normal in course and caliber to the vertebrobasilar confluence without stenosis or evidence of dissection. IMPRESSION: 1. Chronic ischemic microangiopathy and generalized volume loss without acute intracranial abnormality. 2. Normal cervical and intracranial MRA. Electronically Signed   By: Deatra RobinsonKevin  Herman M.D.   On: 05/18/2019 04:43   Mr Brain Wo Contrast  Result Date: 05/18/2019 CLINICAL DATA:  Speech difficulties.  Fall 2 weeks ago. EXAM: MR HEAD WITHOUT CONTRAST MR CIRCLE OF WILLIS WITHOUT CONTRAST MRA OF THE NECK WITHOUT AND WITH CONTRAST TECHNIQUE: Multiplanar, multiecho pulse sequences of the brain, circle of willis and surrounding structures were obtained without intravenous contrast. Angiographic  images of the neck were obtained using MRA technique without and with intravenous contrast. CONTRAST:  6 mL Gadavist COMPARISON:  Head CT 05/17/2019 FINDINGS: MRI HEAD FINDINGS BRAIN: The midline structures are normal. There is no acute infarct, acute hemorrhage or mass. Early confluent hyperintense T2-weighted signal of the periventricular and deep white matter, most commonly due to chronic ischemic microangiopathy. Generalized atrophy without lobar predilection. Blood-sensitive sequences show no chronic microhemorrhage or superficial siderosis. SKULL AND UPPER CERVICAL SPINE: Posterior craniocervical fusion. SINUSES/ORBITS: No fluid levels or advanced mucosal thickening. No mastoid or middle ear effusion. The orbits are normal. MRA HEAD FINDINGS POSTERIOR CIRCULATION: --Vertebral arteries: Normal V4 segments. --Posterior inferior cerebellar arteries (PICA): Patent origins from the vertebral arteries. --Anterior inferior cerebellar arteries (AICA): Patent origins from the basilar artery. --Basilar artery: Normal. --Superior cerebellar arteries: Normal. --Posterior cerebral arteries (PCA): Normal. The right PCA is predominantly supplied by the posterior communicating artery. ANTERIOR CIRCULATION: --Intracranial internal carotid arteries: Normal. --Anterior cerebral arteries (ACA): Normal. Both A1 segments are present. Patent anterior communicating artery (a-comm). --Middle cerebral arteries (MCA): Normal. MRA NECK FINDINGS Aortic arch: Normal 3 vessel aortic branching pattern. The visualized subclavian arteries are normal. Right carotid system: Normal course and caliber without stenosis or evidence of dissection. Left carotid system: Normal course and caliber without stenosis or evidence of dissection. Vertebral arteries: Left dominant. Vertebral artery origins are normal. Vertebral arteries are normal in course and caliber to the vertebrobasilar confluence without stenosis or evidence of dissection. IMPRESSION: 1.  Chronic ischemic microangiopathy and generalized volume loss without acute intracranial abnormality. 2. Normal cervical and intracranial MRA. Electronically Signed   By: Deatra RobinsonKevin  Herman M.D.   On: 05/18/2019 04:43     I have reviewed the above imaging : MRI brain, MRA head and neck.  Negative for acute infarct MRA shows no critical stenosis    ASSESSMENT AND PLAN   85 y.o. female with past medical history of supraventricular tachycardia, hyperlipidemia, cervical injury [facility] after her granddaughter brought her for persistent difficulty with speech since Friday last night.  Patient also had a fall few weeks ago. Patient was evaluated by teleneurology, who recommended admission for stroke work-up.  [facility], patient still had difficulty with speech.  On arrival, her symptoms had improved per floor nurse.  On my exam as well there was no significant aphasia.  This is unlikely that this was a TIA given how long her symptoms persisted, suspect other etiology for word finding difficulties such as migraine versus acute toxic metabolic encephalopathy.    Word finding difficulty D/D migraine acute toxic metabolic encephalopathy   Recommendations: MRI brain as well as MRA head and neck: Performed and negative for acute stroke,  Urine drug screen Hold  Neurontin for now, can restart with patient becomes more alert  Lakeria Starkman [facility] Pager Number [phone]

## 2019-05-18 NOTE — Progress Notes (Signed)
  Echocardiogram 2D Echocardiogram has been performed.  Denise Espinoza 05/18/2019, 12:19 PM

## 2019-05-19 ENCOUNTER — Encounter (HOSPITAL_COMMUNITY): Payer: Self-pay | Admitting: Physician Assistant

## 2019-05-19 DIAGNOSIS — I471 Supraventricular tachycardia: Secondary | ICD-10-CM

## 2019-05-19 DIAGNOSIS — I951 Orthostatic hypotension: Secondary | ICD-10-CM

## 2019-05-19 DIAGNOSIS — R55 Syncope and collapse: Secondary | ICD-10-CM

## 2019-05-19 LAB — BASIC METABOLIC PANEL
Anion gap: 9 (ref 5–15)
BUN: 16 mg/dL (ref 8–23)
CO2: 25 mmol/L (ref 22–32)
Calcium: 8.9 mg/dL (ref 8.9–10.3)
Chloride: 109 mmol/L (ref 98–111)
Creatinine, Ser: 0.65 mg/dL (ref 0.44–1.00)
GFR calc Af Amer: >60 mL/min (ref >60)
GFR calc non Af Amer: >60 mL/min (ref >60)
Glucose, Bld: 97 mg/dL (ref 70–99)
Potassium: 3.8 mmol/L (ref 3.5–5.1)
Sodium: 143 mmol/L (ref 135–145)

## 2019-05-19 MED ORDER — SODIUM CHLORIDE 0.9 % IV BOLUS
500.0000 mL | Freq: Once | INTRAVENOUS | Status: AC
  Administered 2019-05-19: 500 mL via INTRAVENOUS

## 2019-05-19 MED ORDER — FLUDROCORTISONE ACETATE 0.1 MG PO TABS: 0.1000 mg | ORAL_TABLET | Freq: Every day | ORAL | 0 refills | Status: DC

## 2019-05-19 MED ORDER — FLUDROCORTISONE ACETATE 0.1 MG PO TABS
0.2000 mg | ORAL_TABLET | Freq: Every day | ORAL | Status: DC
  Administered 2019-05-20 – 2019-05-24 (×5): 0.2 mg via ORAL
  Filled 2019-05-19 (×5): qty 2

## 2019-05-19 MED ORDER — FLUDROCORTISONE ACETATE 0.1 MG PO TABS
0.1000 mg | ORAL_TABLET | Freq: Once | ORAL | Status: AC
  Administered 2019-05-19: 0.1 mg via ORAL
  Filled 2019-05-19: qty 1

## 2019-05-19 MED ORDER — LEVETIRACETAM 500 MG PO TABS: 500.0000 mg | ORAL_TABLET | Freq: Two times a day (BID) | ORAL | 0 refills | Status: DC

## 2019-05-19 NOTE — Progress Notes (Signed)
Occupational Therapy Treatment Patient Details Name: Denise Espinoza MRN: [id number] DOB: 21-Nov-1929 Today's Date: 05/19/2019    History of present illness This 85 y.o. female admitted with transient spells of impaired speech, confusion, and lethargy.  MRI of brain was negative for acute infarct.  Current diagnosis; encephalopathy with confusion/and lethargy consistent with post ictal state after transient neurological spell.  Work up underway.  PMH: SVT, cervical cord injury s/p cervical fusion.    OT comments  Pt reports feeling much better today.  The short blessed test was re-administered with her scoring 15/28 - she was able to generate more responses today, but continues with significant difficulties with problem solving, sequencing, attention.  CNA present, and orthostatic BP taken - see below in bold.  Pt near syncopal with significant reduction in BP upon standing, and was returned to supine.   Follow Up Recommendations  Home health OT;Supervision/Assistance - 24 hour    Equipment Recommendations  None recommended by OT    Recommendations for Other Services      Precautions / Restrictions Precautions Precautions: Fall Restrictions Weight Bearing Restrictions: No       Mobility Bed Mobility Overal bed mobility: Needs Assistance Bed Mobility: Sit to Supine;Supine to Sit Rolling: Supervision Sidelying to sit: Max assist   Sit to supine: Min guard   General bed mobility comments: Pt assisted with return to supine due to near syncopal episode   Transfers Overall transfer level: Needs assistance Equipment used: Rolling walker (2 wheeled);2 person hand held assist Transfers: Sit to/from Stand Sit to Stand: Min guard         General transfer comment: Hands-on guarding as pt powered up to full stand with RW for support. Increased time to gain/maintain balance but was able to stand with 1UE support for standing BP measurement.     Balance Overall balance assessment: Needs  assistance Sitting-balance support: Feet supported Sitting balance-Leahy Scale: Fair Sitting balance - Comments: able to maintain static sitting with supervision    Standing balance support: Bilateral upper extremity supported Standing balance-Leahy Scale: Poor Standing balance comment: requires UE support                            ADL either performed or assessed with clinical judgement   ADL                                         General ADL Comments: limited activity due to orthostatic hypotension      Vision       Perception     Praxis      Cognition Arousal/Alertness: Awake/alert Behavior During Therapy: WFL for tasks assessed/performed Overall Cognitive Status: Impaired/Different from baseline Area of Impairment: Memory;Problem solving                   Current Attention Level: Selective   Following Commands: Follows one step commands consistently;Follows multi-step commands inconsistently     Problem Solving: Slow processing;Difficulty sequencing;Requires verbal cues General Comments: Pt states she is feeling much better today and is hopeful to go home.  Re-administered the SBT, and she scored 15/28.  She continues to have difficulty with sequencing, problem solving, and attention.  She was better able to generate responses today, but still with multiple errors in counting backwards, reciting months backwards, remembering info  Exercises     Shoulder Instructions       General Comments BP supine 124/79 (75); sit 98/68 (66); Standing 65/51 (64), Pt with near syncope.  CNA present and assisting who commicated results with nsg    Pertinent Vitals/ Pain       Pain Assessment: No/denies pain  Home Living                                          Prior Functioning/Environment              Frequency  Min 2X/week        Progress Toward Goals  OT Goals(current goals can now be found in  the care plan section)  Progress towards OT goals: Not progressing toward goals - comment(orthostatic hypotension )  Acute Rehab OT Goals Patient Stated Goal: Pt wants to be able to feel comfortable with her great grandson at home. "I don't feel like I should be like this around a child."  Plan Discharge plan remains appropriate    Co-evaluation                 AM-PAC OT "6 Clicks" Daily Activity     Outcome Measure   Help from another person eating meals?: None Help from another person taking care of personal grooming?: A Little Help from another person toileting, which includes using toliet, bedpan, or urinal?: A Little Help from another person bathing (including washing, rinsing, drying)?: A Lot Help from another person to put on and taking off regular upper body clothing?: A Little Help from another person to put on and taking off regular lower body clothing?: A Lot 6 Click Score: 17    End of Session    OT Visit Diagnosis: Unsteadiness on feet (R26.81);Cognitive communication deficit (R41.841)   Activity Tolerance Treatment limited secondary to medical complications (Comment)   Patient Left in bed;with call bell/phone within reach   Nurse Communication Mobility status        Time: 2202-5427 OT Time Calculation (min): 22 min  Charges: OT General Charges $OT Visit: 1 Visit OT Treatments $Therapeutic Activity: 8-22 mins  Jeani Hawking, OTR/L Acute Rehabilitation Services Pager [phone] Office [phone]    Jeani Hawking M 05/19/2019, 2:54 PM

## 2019-05-19 NOTE — Consult Note (Addendum)
Cardiology Consultation:   Patient ID: Denise Espinoza; [id number]; [birth year]   Admit date: 05/17/2019 Date of Consult: 05/19/2019  Primary Care Provider: Beola Cord, [place] Primary Cardiologist: Raechel Chute, MD in [place], [place], [place], Dr [facility] Primary Electrophysiologist:  None   Patient Profile:   Denise Espinoza is a 85 y.o. female with a hx of cervical spine stenosis w/ myelopathy, central cord syndrome, SVT, OA, who is being seen today for the evaluation of syncope, 2.64 sec pause at the request of Dr Wynelle Cleveland.  History of Present Illness:   [name] was admitted 4/28-02/15/2023 displaced type II dens fracture with ORIF on 4/30.  --- She was discharged to CIR on 5/9 for significant functional deficits.  Follow-up CT showed C2 laminar screw intruding into the posterior spinal canal and right lateral recess.  It was removed on 5/5 but she continued to have declining worsening of numbness. ---  She was admitted to rehab 5/9-03/12/2019 for central cord syndrome.  She also had a UTI and anemia.  She was noted to have orthostatic hypotension and Florinef was started during this admission. ---    After discharge from rehab on 7/11, she was seen by Dr. Tessa Lerner on 7/29.  At that time, there was concern about lower extremity edema.  This was felt to be dependent edema but it was noted that gabapentin and Florinef could contribute to this so the Florinef was stopped and gabapentin was tapered.  She was noted to have a history of SVT and was having problems with presyncope.  She was to continue on metoprolol daily.  She was having sudden "dropouts", cause unclear, as they would start while she was seated. Cardiology referral made.  8/8, patient was admitted for syncopal episode.  Cardiology asked to evaluate.  According to the patient, all of her episodes are the same.  There is not a clear orthostatic component, many of them start while she is sitting still.  There is a prodrome where she feels  lightheaded and has to grab onto something to keep from falling.  Many times, that is all she needs to do.  She will hold onto something to keep from falling and the symptoms will pass in just a few seconds.  She other wise feels well.  There is no associated chest pain, shortness of breath or palpitations.  On the day she fell, she states that she was sitting in a chair and a staff member was in the room.  She had the usual feeling, but it was worse than usual.  She woke up on the floor with the nurse standing over her.  When asked about getting her orthostatic vital signs checked, she stated she felt a little lightheaded but it did not completely reproduce the symptoms she was having.  Because of her musculoskeletal issues, she has not been very active.  However, she has not had any chest pain or shortness of breath with exertion.  Her lower extremity edema has improved.  She denies orthopnea or PND.  She sees Dr. Darral Dash in [place] periodically, but has not had any recent testing.  She has worn a monitor in the past, but not in years.   Past Medical History:  Diagnosis Date   Allergic rhinitis    Anosmia    Arthritis    Full dentures    History of diverticulitis    Insomnia    Senile osteoporosis    SOB (shortness of breath)    SVT (supraventricular tachycardia) (HCC)    Varicose  vein of leg    left   Wears glasses     Past Surgical History:  Procedure Laterality Date   COLON RESECTION  1989   diverticulitis   COLONOSCOPY     ESOPHAGEAL DILATION  2015   endoscopy   POSTERIOR CERVICAL FUSION/FORAMINOTOMY N/A 02/11/2019   Procedure: POSTERIOR CERVICAL INSTRUMENTATION REMOVAL CERVICAL TWO;  Surgeon: Lisbeth Renshaw, MD;  Location: [facility] OR;  Service: Neurosurgery;  Laterality: N/A;   POSTERIOR CERVICAL LAMINECTOMY N/A 02/06/2019   Procedure: Cervical one LAMINECTOMY, Occiput-Cervical four fusion;  Surgeon: Lisbeth Renshaw, MD;  Location: [facility] OR;  Service:  Neurosurgery;  Laterality: N/A;     Prior to Admission medications   Medication Sig Start Date End Date Taking? Authorizing Provider  calcium-vitamin D (OSCAL WITH D) 500-200 MG-UNIT per tablet Take 1 tablet by mouth 2 (two) times daily.    Yes [provider]  Dextran 70-Hypromellose (ARTIFICIAL TEARS) 0.1-0.3 % SOLN Place 1 drop into both eyes 3 (three) times daily as needed. 03/24/19  Yes [provider]  diclofenac sodium (VOLTAREN) 1 % GEL Apply 2 g topically 4 (four) times daily. 04/17/19  Yes Sharee Holster, NP  gabapentin (NEURONTIN) 300 MG capsule Take 1 capsule (300 mg total) by mouth 3 (three) times daily. 04/17/19  Yes Sharee Holster, NP  metoprolol succinate (TOPROL-XL) 25 MG 24 hr tablet Take 0.5 tablets (12.5 mg total) by mouth daily. 04/17/19  Yes Sharee Holster, NP  pantoprazole (PROTONIX) 40 MG tablet Take 1 tablet (40 mg total) by mouth 2 (two) times daily. 04/17/19  Yes Sharee Holster, NP  rosuvastatin (CRESTOR) 5 MG tablet Take 1 tablet (5 mg total) by mouth daily. 04/17/19  Yes Sharee Holster, NP  sennosides-docusate sodium (SENOKOT-S) 8.6-50 MG tablet Take 2 tablets by mouth 2 (two) times daily as needed for constipation. 03/24/19  Yes [provider]  traZODone (DESYREL) 50 MG tablet Take 0.5 tablets (25 mg total) by mouth at bedtime. 04/17/19  Yes Sharee Holster, NP  acetaminophen (TYLENOL) 325 MG tablet Take 1-2 tablets (325-650 mg total) by mouth every 4 (four) hours as needed for mild pain. 03/10/19   Love, Evlyn Kanner, PA-C  fludrocortisone (FLORINEF) 0.1 MG tablet Take 1 tablet (0.1 mg total) by mouth daily. 05/19/19   Calvert Cantor, MD  levETIRAcetam (KEPPRA) 500 MG tablet Take 1 tablet (500 mg total) by mouth 2 (two) times daily. 05/19/19   Calvert Cantor, MD  NON FORMULARY Diet Type: Regular 03/12/19   [provider]  Nutritional Supplements (ENSURE ENLIVE PO) Take 1 Bottle by mouth daily. Prefers Vanilla 03/17/19   [provider]    polyethylene glycol (MIRALAX / GLYCOLAX) 17 g packet Take 17 g by mouth daily. 03/13/19   [provider]    Inpatient Medications: Scheduled Meds:  aspirin EC  81 mg Oral Daily   enoxaparin (LOVENOX) injection  40 mg Subcutaneous Q24H   [START ON 05/20/2019] fludrocortisone  0.2 mg Oral Daily   gabapentin  300 mg Oral TID   levETIRAcetam  500 mg Oral BID   metoprolol succinate  12.5 mg Oral Daily   pantoprazole  40 mg Oral BID   polyethylene glycol  17 g Oral Daily   QUEtiapine  25 mg Oral QHS   rosuvastatin  5 mg Oral Daily   Continuous Infusions:  sodium chloride 75 mL/hr at 05/18/19 1903   PRN Meds: acetaminophen **OR** acetaminophen (TYLENOL) oral liquid 160 mg/5 mL **OR** acetaminophen, senna-docusate  Allergies:    Allergies  Allergen Reactions   Penicillins Hives, Swelling, Rash and Other (See Comments)    Did it involve swelling of the face/tongue/throat, SOB, or low BP? #  #  #  YES  #  #  #  Did it involve sudden or severe rash/hives, skin peeling, or any reaction on the inside of your mouth or nose? No Did you need to seek medical attention at a hospital or doctor's office? #  #  #  YES  #  #  #  When did it last happen?85 years old.    Codeine Phosphate [Codeine] Other (See Comments)    headache    Social History:   Social History   Socioeconomic History   Marital status: Widowed    Spouse name: Not on file   Number of children: Not on file   Years of education: Not on file   Highest education level: Not on file  Occupational History   Occupation: Retired  Ecologist strain: Not on file   Food insecurity    Worry: Not on file    Inability: Not on Occupational hygienist needs    Medical: Not on file    Non-medical: Not on file  Tobacco Use   Smoking status: Never Smoker   Smokeless tobacco: Never Used  Substance and Sexual Activity   Alcohol use: Yes    Comment: rare   Drug use: No    Sexual activity: Not on file  Lifestyle   Physical activity    Days per week: Not on file    Minutes per session: Not on file   Stress: Not on file  Relationships   Social connections    Talks on phone: Not on file    Gets together: Not on file    Attends religious service: Not on file    Active member of club or organization: Not on file    Attends meetings of clubs or organizations: Not on file    Relationship status: Not on file   Intimate partner violence    Fear of current or ex partner: Not on file    Emotionally abused: Not on file    Physically abused: Not on file    Forced sexual activity: Not on file  Other Topics Concern   Not on file  Social History Narrative   Not on file    Family History:   Family History  Problem Relation Age of Onset   Cancer Mother    High blood pressure Father    Emphysema Brother    Family Status:  Family Status  Relation Name Status   Mother  Deceased   Father  Deceased   Sister  Deceased   Brother  Deceased    ROS:  Please see the history of present illness.  All other ROS reviewed and negative.     Physical Exam/Data:   BP- Lying  124/79 144/66  BP- Lying   Pulse- Lying  75 62  Pulse- Lying   BP- Sitting  98/68 96/64  BP- Sitting   Pulse- Sitting  66 73  Pulse- Sitting   BP- Standing at 0 minutes  63/51 70/54  BP- Standing at 0 minutes   Pulse- Standing at 0 minutes  64         Vitals:   05/19/19 0053 05/19/19 0452 05/19/19 0811 05/19/19 1622  BP: 138/66 128/62 (!) 144/72 130/66  Pulse: 68 60 69 67  Resp: Temp: (!) 97.5 F (36.4 C) 97.9 F (36.6 C) 98.1 F (36.7 C) 98.1 F (36.7 C)  TempSrc: Oral Oral Oral Oral  SpO2: 94% 96% 98% 98%  Weight:      Height:       No intake or output data in the 24 hours ending 05/19/19 1744 Filed Weights   05/17/19 1303 05/17/19 2250  Weight: 66.7 kg 64 kg   Body mass index is 24.98 kg/m.  General:  Well nourished, frail elderly female, in no  acute distress HEENT: normal for age, has mild "Shiner" on her right Lymph: no adenopathy Neck: no JVD Endocrine:  No thryomegaly Vascular: No carotid bruits; 4/4 extremity pulses 2+, without bruits  Cardiac:  normal S1, S2; RRR; no murmur  Lungs:  clear to auscultation bilaterally, no wheezing, rhonchi or rales  Abd: soft, nontender, no hepatomegaly  Ext: no edema Musculoskeletal: Chronic deformities, BUE and BLE strength weak but equal Skin: warm and dry  Neuro:  CNs 2-12 intact, no focal abnormalities noted Psych:  Normal affect   EKG:  The EKG was personally reviewed and demonstrates: 05/17/2019 ECG is sinus rhythm, heart rate 77, no acute ischemic changes, normal intervals she qualified for LVH on 02/11/2019 ECG but otherwise no significant change Telemetry:  Telemetry was personally reviewed and demonstrates: Sinus rhythm with frequent pauses of approximately 2 seconds.  Some bradycardia, down into the high 30s low 40s that is not sustained.  One pause of 2.64 seconds, others are significantly shorter  Relevant CV Studies:  ECHO: 05/18/2019  1. The left ventricle has normal systolic function, with an ejection fraction of 55-60%. The cavity size was normal. There is moderately increased left ventricular wall thickness. Left ventricular diastolic parameters were normal.  2. The right ventricle has normal systolic function. The cavity was normal. There is no increase in right ventricular wall thickness.  3. Moderate thickening of the mitral valve leaflet. Mild calcification of the mitral valve leaflet.  4. Tricuspid valve regurgitation is mild-moderate.  5. The aortic valve is tricuspid. Moderate thickening of the aortic valve. Sclerosis without any evidence of stenosis of the aortic valve. Aortic valve regurgitation is mild by color flow Doppler.  6. The aorta is normal in size and structure.   Laboratory Data:  Chemistry Recent Labs  Lab 05/17/19 1533 05/18/19 0906 05/19/19 0619   NA 141 140 143  K 3.9 3.6 3.8  CL 108 105 109  CO2 GLUCOSE 91 81 97  BUN CREATININE 0.55 0.64 0.65  CALCIUM 9.2 9.2 8.9  GFRNONAA >60 >60 >60  GFRAA >60 >60 >60  ANIONGAP Lab Results  Component Value Date   ALT 10 05/17/2019   AST 20 05/17/2019   ALKPHOS 91 05/17/2019   BILITOT 0.9 05/17/2019   Hematology Recent Labs  Lab 05/17/19 1302 05/17/19 1531 05/18/19 0906  WBC 4.6  --  3.7*  RBC 4.13  --  4.06  HGB 11.9* 12.2 11.9*  HCT 38.0 36.0 37.2  MCV 92.0  --  91.6  MCH 28.8  --  29.3  MCHC 31.3  --  32.0  RDW 14.1  --  13.9  PLT 190  --  189   Cardiac Enzymes  High Sensitivity Troponin:  No results for input(s): TROPONINIHS in the last 720 hours.     TSH: No results found for: TSH Lipids: Lab Results  Component Value  Date   CHOL 148 05/18/2019   HDL 58 05/18/2019   LDLCALC 76 05/18/2019   TRIG 70 05/18/2019   CHOLHDL 2.6 05/18/2019   HgbA1c: Lab Results  Component Value Date   HGBA1C 5.2 05/18/2019   Magnesium: No results found for: MG   Radiology/Studies:  Dg Ribs Unilateral W/chest Left  Result Date: 05/17/2019 CLINICAL DATA:  Pain status post fall EXAM: LEFT RIBS AND CHEST - 3+ VIEW COMPARISON:  For Feb 16, 2019 FINDINGS: The heart size is mildly enlarged. Aortic calcifications are noted. There is some mild but stable height loss involving the midthoracic spine. There is no pneumothorax. There is a subacute appearing mildly displaced fracture involving the eighth rib laterally on the left. There is no additional acute displaced fracture or dislocation. IMPRESSION: 1. Subacute mildly displaced fracture involving the eighth rib laterally on the left. 2. No pneumothorax. 3. No definite acute cardiopulmonary process. Electronically Signed   By: Katherine Mantle M.D.   On: 05/17/2019 19:29   Ct Head Wo Contrast  Result Date: 05/17/2019 CLINICAL DATA:  Difficulty speaking since yesterday patient fell 2 weeks ago. Right facial  bruising. EXAM: CT HEAD WITHOUT CONTRAST CT CERVICAL SPINE WITHOUT CONTRAST TECHNIQUE: Multidetector CT imaging of the head and cervical spine was performed following the standard protocol without intravenous contrast. Multiplanar CT image reconstructions of the cervical spine were also generated. COMPARISON:  Cervical spine CT 02/10/2019. FINDINGS: CT HEAD FINDINGS Brain: There is no evidence of acute intracranial hemorrhage, mass lesion, brain edema or extra-axial fluid collection. There is mild atrophy with prominence of the ventricles and subarachnoid spaces. There is patchy low-density in the periventricular white matter which is most likely due to chronic small vessel ischemic changes. There is no CT evidence of acute cortical infarction. Vascular: Intracranial vascular calcifications. No hyperdense vessel identified. Skull: Negative for fracture or focal lesion. There are postsurgical changes related to craniocervical fusion posteriorly. The hardware is intact. Sinuses/Orbits: The visualized paranasal sinuses and mastoid air cells are clear. No orbital abnormalities are seen. Previous scleral banding. Other: None. CT CERVICAL SPINE FINDINGS Alignment: 7 mm anterolisthesis at C1-2 secondary to a fracture of the base of the dens. Minimal degenerative anterolisthesis at C7-T1. Skull base and vertebrae: No evidence of acute fracture. Possible partial healing of the dens fracture with unchanged anterior displacement as described. Patient is status post posterior fusion from the occiput through C4. A previously demonstrated screw extending into the spinal canal at C2-3 has been removed. The hardware otherwise appears unchanged. There is limited purchase of the C3 and C4 laminar screws, especially on the left. There is some lucency surrounding the left C4 screw. The posterior arch of C1 has been resected is adequate decompression the craniocervical junction. Soft tissues and spinal canal: No prevertebral fluid or  swelling. No visible canal hematoma. Disc levels: There is multilevel cervical spondylosis with disc space narrowing and uncinate spurring. Prominent posterior osteophytes remain at C3-4, contributing to residual central stenosis. The left C4-5 facet joint is ankylosed. Upper chest: Mild biapical scarring. Other: None. IMPRESSION: 1. No acute intracranial findings. 2. No evidence of acute cervical spine fracture, traumatic subluxation or static signs of instability. 3. Stable anterolisthesis at C1-2 secondary to a chronic fracture involving the base of the dens. The craniocervical junction appears adequately decompressed, and there is possible early healing of the fracture. 4. Interval removal of the previously demonstrated C2 laminar screw extending into the spinal canal. The additional hardware is unchanged with limited osseous purchase of  the C3 and C4 laminar screws. Electronically Signed   By: Carey Bullocks M.D.   On: 05/17/2019 13:51   Ct Cervical Spine Wo Contrast  Result Date: 05/17/2019 CLINICAL DATA:  Difficulty speaking since yesterday patient fell 2 weeks ago. Right facial bruising. EXAM: CT HEAD WITHOUT CONTRAST CT CERVICAL SPINE WITHOUT CONTRAST TECHNIQUE: Multidetector CT imaging of the head and cervical spine was performed following the standard protocol without intravenous contrast. Multiplanar CT image reconstructions of the cervical spine were also generated. COMPARISON:  Cervical spine CT 02/10/2019. FINDINGS: CT HEAD FINDINGS Brain: There is no evidence of acute intracranial hemorrhage, mass lesion, brain edema or extra-axial fluid collection. There is mild atrophy with prominence of the ventricles and subarachnoid spaces. There is patchy low-density in the periventricular white matter which is most likely due to chronic small vessel ischemic changes. There is no CT evidence of acute cortical infarction. Vascular: Intracranial vascular calcifications. No hyperdense vessel identified. Skull:  Negative for fracture or focal lesion. There are postsurgical changes related to craniocervical fusion posteriorly. The hardware is intact. Sinuses/Orbits: The visualized paranasal sinuses and mastoid air cells are clear. No orbital abnormalities are seen. Previous scleral banding. Other: None. CT CERVICAL SPINE FINDINGS Alignment: 7 mm anterolisthesis at C1-2 secondary to a fracture of the base of the dens. Minimal degenerative anterolisthesis at C7-T1. Skull base and vertebrae: No evidence of acute fracture. Possible partial healing of the dens fracture with unchanged anterior displacement as described. Patient is status post posterior fusion from the occiput through C4. A previously demonstrated screw extending into the spinal canal at C2-3 has been removed. The hardware otherwise appears unchanged. There is limited purchase of the C3 and C4 laminar screws, especially on the left. There is some lucency surrounding the left C4 screw. The posterior arch of C1 has been resected is adequate decompression the craniocervical junction. Soft tissues and spinal canal: No prevertebral fluid or swelling. No visible canal hematoma. Disc levels: There is multilevel cervical spondylosis with disc space narrowing and uncinate spurring. Prominent posterior osteophytes remain at C3-4, contributing to residual central stenosis. The left C4-5 facet joint is ankylosed. Upper chest: Mild biapical scarring. Other: None. IMPRESSION: 1. No acute intracranial findings. 2. No evidence of acute cervical spine fracture, traumatic subluxation or static signs of instability. 3. Stable anterolisthesis at C1-2 secondary to a chronic fracture involving the base of the dens. The craniocervical junction appears adequately decompressed, and there is possible early healing of the fracture. 4. Interval removal of the previously demonstrated C2 laminar screw extending into the spinal canal. The additional hardware is unchanged with limited osseous  purchase of the C3 and C4 laminar screws. Electronically Signed   By: Carey Bullocks M.D.   On: 05/17/2019 13:51   Mr Angio Head Wo Contrast  Result Date: 05/18/2019 CLINICAL DATA:  Speech difficulties.  Fall 2 weeks ago. EXAM: MR HEAD WITHOUT CONTRAST MR CIRCLE OF WILLIS WITHOUT CONTRAST MRA OF THE NECK WITHOUT AND WITH CONTRAST TECHNIQUE: Multiplanar, multiecho pulse sequences of the brain, circle of willis and surrounding structures were obtained without intravenous contrast. Angiographic images of the neck were obtained using MRA technique without and with intravenous contrast. CONTRAST:  6 mL Gadavist COMPARISON:  Head CT 05/17/2019 FINDINGS: MRI HEAD FINDINGS BRAIN: The midline structures are normal. There is no acute infarct, acute hemorrhage or mass. Early confluent hyperintense T2-weighted signal of the periventricular and deep white matter, most commonly due to chronic ischemic microangiopathy. Generalized atrophy without lobar predilection. Blood-sensitive sequences show  no chronic microhemorrhage or superficial siderosis. SKULL AND UPPER CERVICAL SPINE: Posterior craniocervical fusion. SINUSES/ORBITS: No fluid levels or advanced mucosal thickening. No mastoid or middle ear effusion. The orbits are normal. MRA HEAD FINDINGS POSTERIOR CIRCULATION: --Vertebral arteries: Normal V4 segments. --Posterior inferior cerebellar arteries (PICA): Patent origins from the vertebral arteries. --Anterior inferior cerebellar arteries (AICA): Patent origins from the basilar artery. --Basilar artery: Normal. --Superior cerebellar arteries: Normal. --Posterior cerebral arteries (PCA): Normal. The right PCA is predominantly supplied by the posterior communicating artery. ANTERIOR CIRCULATION: --Intracranial internal carotid arteries: Normal. --Anterior cerebral arteries (ACA): Normal. Both A1 segments are present. Patent anterior communicating artery (a-comm). --Middle cerebral arteries (MCA): Normal. MRA NECK FINDINGS  Aortic arch: Normal 3 vessel aortic branching pattern. The visualized subclavian arteries are normal. Right carotid system: Normal course and caliber without stenosis or evidence of dissection. Left carotid system: Normal course and caliber without stenosis or evidence of dissection. Vertebral arteries: Left dominant. Vertebral artery origins are normal. Vertebral arteries are normal in course and caliber to the vertebrobasilar confluence without stenosis or evidence of dissection. IMPRESSION: 1. Chronic ischemic microangiopathy and generalized volume loss without acute intracranial abnormality. 2. Normal cervical and intracranial MRA. Electronically Signed   By: Deatra RobinsonKevin  Herman M.D.   On: 05/18/2019 04:43   Mr Angio Neck W Wo Contrast  Result Date: 05/18/2019 CLINICAL DATA:  Speech difficulties.  Fall 2 weeks ago. EXAM: MR HEAD WITHOUT CONTRAST MR CIRCLE OF WILLIS WITHOUT CONTRAST MRA OF THE NECK WITHOUT AND WITH CONTRAST TECHNIQUE: Multiplanar, multiecho pulse sequences of the brain, circle of willis and surrounding structures were obtained without intravenous contrast. Angiographic images of the neck were obtained using MRA technique without and with intravenous contrast. CONTRAST:  6 mL Gadavist COMPARISON:  Head CT 05/17/2019 FINDINGS: MRI HEAD FINDINGS BRAIN: The midline structures are normal. There is no acute infarct, acute hemorrhage or mass. Early confluent hyperintense T2-weighted signal of the periventricular and deep white matter, most commonly due to chronic ischemic microangiopathy. Generalized atrophy without lobar predilection. Blood-sensitive sequences show no chronic microhemorrhage or superficial siderosis. SKULL AND UPPER CERVICAL SPINE: Posterior craniocervical fusion. SINUSES/ORBITS: No fluid levels or advanced mucosal thickening. No mastoid or middle ear effusion. The orbits are normal. MRA HEAD FINDINGS POSTERIOR CIRCULATION: --Vertebral arteries: Normal V4 segments. --Posterior inferior  cerebellar arteries (PICA): Patent origins from the vertebral arteries. --Anterior inferior cerebellar arteries (AICA): Patent origins from the basilar artery. --Basilar artery: Normal. --Superior cerebellar arteries: Normal. --Posterior cerebral arteries (PCA): Normal. The right PCA is predominantly supplied by the posterior communicating artery. ANTERIOR CIRCULATION: --Intracranial internal carotid arteries: Normal. --Anterior cerebral arteries (ACA): Normal. Both A1 segments are present. Patent anterior communicating artery (a-comm). --Middle cerebral arteries (MCA): Normal. MRA NECK FINDINGS Aortic arch: Normal 3 vessel aortic branching pattern. The visualized subclavian arteries are normal. Right carotid system: Normal course and caliber without stenosis or evidence of dissection. Left carotid system: Normal course and caliber without stenosis or evidence of dissection. Vertebral arteries: Left dominant. Vertebral artery origins are normal. Vertebral arteries are normal in course and caliber to the vertebrobasilar confluence without stenosis or evidence of dissection. IMPRESSION: 1. Chronic ischemic microangiopathy and generalized volume loss without acute intracranial abnormality. 2. Normal cervical and intracranial MRA. Electronically Signed   By: Deatra RobinsonKevin  Herman M.D.   On: 05/18/2019 04:43   Mr Brain Wo Contrast  Result Date: 05/18/2019 CLINICAL DATA:  Speech difficulties.  Fall 2 weeks ago. EXAM: MR HEAD WITHOUT CONTRAST MR CIRCLE OF WILLIS WITHOUT CONTRAST MRA OF  THE NECK WITHOUT AND WITH CONTRAST TECHNIQUE: Multiplanar, multiecho pulse sequences of the brain, circle of willis and surrounding structures were obtained without intravenous contrast. Angiographic images of the neck were obtained using MRA technique without and with intravenous contrast. CONTRAST:  6 mL Gadavist COMPARISON:  Head CT 05/17/2019 FINDINGS: MRI HEAD FINDINGS BRAIN: The midline structures are normal. There is no acute infarct,  acute hemorrhage or mass. Early confluent hyperintense T2-weighted signal of the periventricular and deep white matter, most commonly due to chronic ischemic microangiopathy. Generalized atrophy without lobar predilection. Blood-sensitive sequences show no chronic microhemorrhage or superficial siderosis. SKULL AND UPPER CERVICAL SPINE: Posterior craniocervical fusion. SINUSES/ORBITS: No fluid levels or advanced mucosal thickening. No mastoid or middle ear effusion. The orbits are normal. MRA HEAD FINDINGS POSTERIOR CIRCULATION: --Vertebral arteries: Normal V4 segments. --Posterior inferior cerebellar arteries (PICA): Patent origins from the vertebral arteries. --Anterior inferior cerebellar arteries (AICA): Patent origins from the basilar artery. --Basilar artery: Normal. --Superior cerebellar arteries: Normal. --Posterior cerebral arteries (PCA): Normal. The right PCA is predominantly supplied by the posterior communicating artery. ANTERIOR CIRCULATION: --Intracranial internal carotid arteries: Normal. --Anterior cerebral arteries (ACA): Normal. Both A1 segments are present. Patent anterior communicating artery (a-comm). --Middle cerebral arteries (MCA): Normal. MRA NECK FINDINGS Aortic arch: Normal 3 vessel aortic branching pattern. The visualized subclavian arteries are normal. Right carotid system: Normal course and caliber without stenosis or evidence of dissection. Left carotid system: Normal course and caliber without stenosis or evidence of dissection. Vertebral arteries: Left dominant. Vertebral artery origins are normal. Vertebral arteries are normal in course and caliber to the vertebrobasilar confluence without stenosis or evidence of dissection. IMPRESSION: 1. Chronic ischemic microangiopathy and generalized volume loss without acute intracranial abnormality. 2. Normal cervical and intracranial MRA. Electronically Signed   By: Deatra RobinsonKevin  Herman M.D.   On: 05/18/2019 04:43    Assessment and Plan:   1.  Bradycardia - This may be causing some of her symptoms.  She has been on Toprol-XL 12.5 mg for a long time without a dose change. - Symptoms have frequently started at rest, making orthostatic symptoms less likely. -However, her worst episode was about 2 weeks ago, and she was found by family on the floor.  It was thought that she had fallen off a chair. - She was continued on low-dose beta-blocker because of her history of SVT.  I will stop this. -If she continues to have significant bradycardia after beta-blocker washout.,  EP will need to see  2.  Orthostatic hypotension: - During her CIR stay, the Florinef was started for loss of sympathetic tone.  TED hose and an abdominal binder were recommended.  This was later discontinued, due to concerns for causing lower extremity edema. -However, it was still on her med list when she was admitted 8/8 -When she was admitted, orthostatic vital signs were only mildly positive. - The Florinef was held, but by 8/10, her orthostatic vital signs were markedly positive. -The Florinef was restarted today.  Discussion : may have been a combination of bradycardia and orthostatic hypotension that caused her syncope and falls. With pt off BB and back on Florinef, that may be enough. However, w/ hx SVT, will need to wear a monitor as outpt and f/u. May need to contact Dr Kathryne SharperKotlaba tomorrow to arrange.  Would keep on telemetry while in hospital.  Otherwise, per Dr. Butler [place]izwan Principal Problem:   Word finding difficulty Active Problems:   Stenosis of cervical spine with myelopathy (HCC)   Central cord syndrome (  HCC)   Orthostatic hypotension   History of supraventricular tachycardia   H/O Cervical  laminectomy   Supraventricular tachycardia (HCC)   GERD without esophagitis   Chronic constipation   Syncope   Seizures (HCC)   Slurred speech     For questions or updates, please contact [facility] Please consult [email or web address] for contact info under  Cardiology/STEMI.   Melida Quitter, PA-C  05/19/2019 5:44 PM

## 2019-05-19 NOTE — TOC Transition Note (Signed)
Transition of Care [facility]) - CM/SW Discharge Note   Patient Details  Name: Denise Espinoza MRN: [id number] Date of Birth: 1930/07/29  [facility] CM/SW Contact:  Pollie Friar, RN Phone Number: 05/19/2019, 12:45 PM   Clinical Narrative:    Pt discharging home with resumption of [place] services through [facility]. Malachy Mood with [facility] aware.  Pts granddaughter to provide transport home and states pt will have 24 hour supervision at home.   Final next level of care: Home w Home Health Services Barriers to Discharge: No Barriers Identified   Patient Goals and CMS Choice   CMS Medicare.gov Compare Post Acute Care list provided to:: Patient Choice offered to / list presented to : Patient  Discharge Placement                       Discharge Plan and Services   Discharge Planning Services: CM Consult Post Acute Care Choice: Home Health                    HH Arranged: PT, OT, Social Work, Nurse's Aide [place] Agency: [place] Date [place]: 05/19/19   Representative spoke with at [place]: [place] ([place]) Interventions     Readmission Risk Interventions No flowsheet data found.

## 2019-05-19 NOTE — Progress Notes (Signed)
Physical Therapy Treatment Patient Details Name: Denise Espinoza MRN: [id number] DOB: [birth year] Today's Date: 05/19/2019    History of Present Illness This 85 y.o. female admitted with transient spells of impaired speech, confusion, and lethargy.  MRI of brain was negative for acute infarct.  Current diagnosis; encephalopathy with confusion/and lethargy consistent with post ictal state after transient neurological spell.  Work up underway.  PMH: SVT, cervical cord injury s/p cervical fusion.     PT Comments    Session limited today by symptomatic orthostatic hypotension. See below for details. Despite challenges from BP, pt able to perform basic transfers with less assistance this date. Will continue to follow and progress as able per PT POC.   Orthostatic VS for the past 24 hrs:  BP- Lying BP- Sitting BP- Standing at 0 minutes  05/19/19 1359 130/71 (start of session) __________  144/66 (end of session) 96/64 (!) 70/54      Follow Up Recommendations  Home health PT;Supervision/Assistance - 24 hour     Equipment Recommendations  None recommended by PT    Recommendations for Other Services       Precautions / Restrictions Precautions Precautions: Fall Restrictions Weight Bearing Restrictions: No    Mobility  Bed Mobility Overal bed mobility: Needs Assistance Bed Mobility: Rolling;Sidelying to Sit;Sit to Supine Rolling: Supervision Sidelying to sit: Supervision   Sit to supine: Min guard   General bed mobility comments: Increased time but pt able to transition to/from EOB without assistance. Heavy use of rails required.   Transfers Overall transfer level: Needs assistance Equipment used: Rolling walker (2 wheeled) Transfers: Sit to/from Stand Sit to Stand: Min guard         General transfer comment: Hands-on guarding as pt powered up to full stand with RW for support. Increased time to gain/maintain balance but was able to stand with 1UE support for standing BP  measurement.   Ambulation/Gait             General Gait Details: Positive orthostatic hypotension and further mobility deferred.    Stairs             Wheelchair Mobility    Modified Rankin (Stroke Patients Only)       Balance Overall balance assessment: Needs assistance Sitting-balance support: Feet supported Sitting balance-Leahy Scale: Fair Sitting balance - Comments: able to maintain static sitting with supervision    Standing balance support: Single extremity supported Standing balance-Leahy Scale: Poor Standing balance comment: requires UE support                             Cognition Arousal/Alertness: Awake/alert Behavior During Therapy: WFL for tasks assessed/performed Overall Cognitive Status: Within Functional Limits for tasks assessed Area of Impairment: Attention;Following commands;Problem solving                   Current Attention Level: Selective   Following Commands: Follows one step commands consistently;Follows multi-step commands inconsistently     Problem Solving: Slow processing;Difficulty sequencing;Requires verbal cues General Comments: Slow to respond and initiate tasks but overall following one-step commands well.       Exercises      General Comments        Pertinent Vitals/Pain Pain Assessment: No/denies pain    Home Living                      Prior Function  PT Goals (current goals can now be found in the care plan section) Acute Rehab PT Goals Patient Stated Goal: Pt wants to be able to feel comfortable with her great grandson at home. "I don't feel like I should be like this around a child." PT Goal Formulation: With patient Potential to Achieve Goals: Good Progress towards PT goals: Progressing toward goals    Frequency    Min 4X/week      PT Plan Current plan remains appropriate    Co-evaluation              AM-PAC PT "6 Clicks" Mobility   Outcome  Measure  Help needed turning from your back to your side while in a flat bed without using bedrails?: None Help needed moving from lying on your back to sitting on the side of a flat bed without using bedrails?: A Little Help needed moving to and from a bed to a chair (including a wheelchair)?: A Little Help needed standing up from a chair using your arms (e.g., wheelchair or bedside chair)?: A Little Help needed to walk in hospital room?: A Little Help needed climbing 3-5 steps with a railing? : A Lot 6 Click Score: 18    End of Session Equipment Utilized During Treatment: Gait belt Activity Tolerance: Patient tolerated treatment well Patient left: in bed Nurse Communication: Mobility status PT Visit Diagnosis: Unsteadiness on feet (R26.81)     Time: 6433-2951 PT Time Calculation (min) (ACUTE ONLY): 24 min  Charges:  $Therapeutic Activity: 23-37 mins                     Conni Slipper, PT, DPT Acute Rehabilitation Services Pager: [phone] Office: [phone]    Marylynn Pearson 05/19/2019, 2:24 PM

## 2019-05-19 NOTE — Discharge Summary (Signed)
Physician Discharge Summary  Denise Espinoza QAS:[id number] DOB: 22-Dec-1929 DOA: 05/17/2019  PCP: Tacy Learn, FNP  Admit date: 05/17/2019 Discharge date: 05/19/2019  Admitted From: home  Disposition:  home   Recommendations for Outpatient Follow-up:  1. Recommend outpatient event monitor to be arranged at her cardiologist's office 2.  she should follow-up with neurology in 3 to 4 weeks as outpatient  Home Health: Physical therapy and Occupational Therapy   Discharge Condition: Stable CODE STATUS: Not intubate Consultations:  Neurology   Discharge Diagnoses:  Principal Problem:   Word finding difficulty Active Problems:   Syncope     Orthostatic hypotension   Seizures (HCC)   Stenosis of cervical spine with myelopathy (HCC)   History of supraventricular tachycardia   H/O Cervical  laminectomy   Supraventricular tachycardia (HCC)   GERD without esophagitis   Chronic constipation   HPI per Dr Denise Espinoza: Denise Mullinsis a 85 y.o.femalewith medical history significant forcervical spine stenosis with myelopathy, central cord syndrome, SVT,was brought to the ED by family with reports of difficulty getting her words out and unable to finish sentences that started yesterday. Patient denies change in her vision, weakness of her extremities,abnormal sensation, facial droop. She is not on aspirin.  I talked to patient's granddaughter who resides with patient.  She reports this difficulty finding words is sudden very different from her baseline.  Patient was even having difficulty calling her granddaughter's name.  Patient at some point told her granddaughter that she knows what she wants to say she just has difficulty saying it.  At baseline she is very sharp and fluent for 85.  Granddaughter denies slurred speech or other focal deficits. Patient also fell 2 weeks ago, she fell from a sitting position at the counter at her house. Fall was unwitnessed, patient was found on the floor.   Patient thinks she might have been out for about 1 to 2 minutes. She has been having dizzy spells.  It was just discharged from [facility] nursing center 04/19/19, for rehabilitation for functional mobility deficits secondary to C2 fracture and associated myelopathy and central cord pattern, after patient had C1 laminectomy, C2 ORIF and occipital to C4 PLIF.  Also reported where syncopal/presyncopal episodes with patient having sudden "dropouts".  Per notes Florinef was to be discontinued.  Consult to cardiology was placed, as she has a history of SVT.  In the ED the patient was noted to be lethargic, confused and uncooperative.  Hospital Course:  Difficulty with speech - Head and cervical CT in the ED were unremarkable - Neurology consult was requested in the ED and an MRI was ordered -MRI was negative for an acute CVA and showed chronic ischemic microangiopathy  - MRA also unremarkable-see full report below -2D echo noted below does not show any significant abnormalities that would result in syncope -EEG does not reveal any epileptiform discharges - Neurology started the patient on Keppra 500 mg twice daily with the thought that she may have had a seizure-her lethargic and confused state in the ED as suspected by neurology to be a postictal state -Neurology also recommended that bedtime trazodone be discontinued  - she should follow-up with neurology in 3 to 4 weeks as outpatient -- Due to her history of SVT, she is recommended to have an event monitor as well - Her cardiologist is in [place] and this will need to be arranged at their office -No arrhythmias noted in the hospital --the plan has been discussed with the patient and she is  in agreement with the plan  Dizziness/orthostatic hypotension -Apparently she was on Florinef in the past and this was discontinued -She has received IV fluids for her orthostatic symptoms and Florinef has been resumed -I have encouraged her to maintain  adequate hydration at home  Rib fracture - Chest x-ray on 8/8 revealed a Subacute mildly displaced fracture involving the eighth rib laterally on the left. -Patient had a fall about 2 weeks ago as well and it is possible this fracture occurred at that time -No complaints of pain  History of SVT - Continue metoprolol   History of cervical spine stenosis with myelopathy/central cord syndrome status post surgery - displaced type II odontoid fracture requiring C1 laminectomy, C2 ORIF and occipital to C4 PLIF on 02/06/2019.  Patient subsequently required removal of C2 translaminar screw on 02/11/2019 per Dr. Conchita Paris.  Hyperlipidemia -Continue Crestor  Gastroesophageal reflux disease - Continue PPI   Discharge Exam: Vitals:   05/19/19 0452 05/19/19 0811  BP: 128/62 (!) 144/72  Pulse: 60 69  Resp: 16 16  Temp: 97.9 F (36.6 C) 98.1 F (36.7 C)  SpO2: 96% 98%   Vitals:   05/18/19 2008 05/19/19 0053 05/19/19 0452 05/19/19 0811  BP: 131/75 138/66 128/62 (!) 144/72  Pulse: 69 68 60 69  Resp: 16 16 16 16   Temp: 98.1 F (36.7 C) (!) 97.5 F (36.4 C) 97.9 F (36.6 C) 98.1 F (36.7 C)  TempSrc: Oral Oral Oral Oral  SpO2: 94% 94% 96% 98%  Weight:      Height:        General: Pt is alert, awake, not in acute distress Cardiovascular: RRR, S1/S2 +, no rubs, no gallops Respiratory: CTA bilaterally, no wheezing, no rhonchi Abdominal: Soft, NT, ND, bowel sounds + Extremities: no edema, no cyanosis   Discharge Instructions  Discharge Instructions    Increase activity slowly   Complete by: As directed      Allergies as of 05/19/2019      Reactions   Penicillins Hives, Swelling, Rash, Other (See Comments)   Did it involve swelling of the face/tongue/throat, SOB, or low BP? #  #  #  YES  #  #  #  Did it involve sudden or severe rash/hives, skin peeling, or any reaction on the inside of your mouth or nose? No Did you need to seek medical attention at a hospital or doctor's  office? #  #  #  YES  #  #  #  When did it last happen?85 years old.   Codeine Phosphate [codeine] Other (See Comments)   headache      Medication List    STOP taking these medications   traZODone 50 MG tablet Commonly known as: DESYREL     TAKE these medications   acetaminophen 325 MG tablet Commonly known as: TYLENOL Take 1-2 tablets (325-650 mg total) by mouth every 4 (four) hours as needed for mild pain.   Artificial Tears 0.1-0.3 % Soln Generic drug: Dextran 70-Hypromellose Place 1 drop into both eyes 3 (three) times daily as needed.   calcium-vitamin D 500-200 MG-UNIT tablet Commonly known as: OSCAL WITH D Take 1 tablet by mouth 2 (two) times daily.   diclofenac sodium 1 % Gel Commonly known as: VOLTAREN Apply 2 g topically 4 (four) times daily.   ENSURE ENLIVE PO Take 1 Bottle by mouth daily. Prefers Vanilla   fludrocortisone 0.1 MG tablet Commonly known as: FLORINEF Take 1 tablet (0.1 mg total) by mouth daily.  gabapentin 300 MG capsule Commonly known as: NEURONTIN Take 1 capsule (300 mg total) by mouth 3 (three) times daily.   levETIRAcetam 500 MG tablet Commonly known as: KEPPRA Take 1 tablet (500 mg total) by mouth 2 (two) times daily.   metoprolol succinate 25 MG 24 hr tablet Commonly known as: TOPROL-XL Take 0.5 tablets (12.5 mg total) by mouth daily.   NON FORMULARY Diet Type: Regular   pantoprazole 40 MG tablet Commonly known as: PROTONIX Take 1 tablet (40 mg total) by mouth 2 (two) times daily.   polyethylene glycol 17 g packet Commonly known as: MIRALAX / GLYCOLAX Take 17 g by mouth daily.   rosuvastatin 5 MG tablet Commonly known as: CRESTOR Take 1 tablet (5 mg total) by mouth daily.   sennosides-docusate sodium 8.6-50 MG tablet Commonly known as: SENOKOT-S Take 2 tablets by mouth 2 (two) times daily as needed for constipation.      Follow-up Information    Tacy Learn, FNP Follow up in 1 week(s).   Specialty:  Family Medicine Why: You need a referral to Cardiology for an event monitor and a referral to neurology to follow up for possible seizures.  Contact information: [address] [place] [address] [phone]          Allergies  Allergen Reactions  . Penicillins Hives, Swelling, Rash and Other (See Comments)    Did it involve swelling of the face/tongue/throat, SOB, or low BP? #  #  #  YES  #  #  #  Did it involve sudden or severe rash/hives, skin peeling, or any reaction on the inside of your mouth or nose? No Did you need to seek medical attention at a hospital or doctor's office? #  #  #  YES  #  #  #  When did it last happen?85 years old.   . Codeine Phosphate [Codeine] Other (See Comments)    headache     Procedures/Studies: EEG This study is within normal limits.  No seizures or epileptiform discharges were seen throughout the recording.  2D echo 1. The left ventricle has normal systolic function, with an ejection fraction of 55-60%. The cavity size was normal. There is moderately increased left ventricular wall thickness. Left ventricular diastolic parameters were normal.  2. The right ventricle has normal systolic function. The cavity was normal. There is no increase in right ventricular wall thickness.  3. Moderate thickening of the mitral valve leaflet. Mild calcification of the mitral valve leaflet.  4. Tricuspid valve regurgitation is mild-moderate.  5. The aortic valve is tricuspid. Moderate thickening of the aortic valve. Sclerosis without any evidence of stenosis of the aortic valve. Aortic valve regurgitation is mild by color flow Doppler.  6. The aorta is normal in size and structure.  Dg Ribs Unilateral W/chest Left  Result Date: 05/17/2019 CLINICAL DATA:  Pain status post fall EXAM: LEFT RIBS AND CHEST - 3+ VIEW COMPARISON:  For Feb 16, 2019 FINDINGS: The heart size is mildly enlarged. Aortic calcifications are noted. There is some mild but stable  height loss involving the midthoracic spine. There is no pneumothorax. There is a subacute appearing mildly displaced fracture involving the eighth rib laterally on the left. There is no additional acute displaced fracture or dislocation. IMPRESSION: 1. Subacute mildly displaced fracture involving the eighth rib laterally on the left. 2. No pneumothorax. 3. No definite acute cardiopulmonary process. Electronically Signed   By: Katherine Mantle M.D.   On: 05/17/2019 19:29  Ct Head Wo Contrast  Result Date: 05/17/2019 CLINICAL DATA:  Difficulty speaking since yesterday patient fell 2 weeks ago. Right facial bruising. EXAM: CT HEAD WITHOUT CONTRAST CT CERVICAL SPINE WITHOUT CONTRAST TECHNIQUE: Multidetector CT imaging of the head and cervical spine was performed following the standard protocol without intravenous contrast. Multiplanar CT image reconstructions of the cervical spine were also generated. COMPARISON:  Cervical spine CT 02/10/2019. FINDINGS: CT HEAD FINDINGS Brain: There is no evidence of acute intracranial hemorrhage, mass lesion, brain edema or extra-axial fluid collection. There is mild atrophy with prominence of the ventricles and subarachnoid spaces. There is patchy low-density in the periventricular white matter which is most likely due to chronic small vessel ischemic changes. There is no CT evidence of acute cortical infarction. Vascular: Intracranial vascular calcifications. No hyperdense vessel identified. Skull: Negative for fracture or focal lesion. There are postsurgical changes related to craniocervical fusion posteriorly. The hardware is intact. Sinuses/Orbits: The visualized paranasal sinuses and mastoid air cells are clear. No orbital abnormalities are seen. Previous scleral banding. Other: None. CT CERVICAL SPINE FINDINGS Alignment: 7 mm anterolisthesis at C1-2 secondary to a fracture of the base of the dens. Minimal degenerative anterolisthesis at C7-T1. Skull base and vertebrae: No  evidence of acute fracture. Possible partial healing of the dens fracture with unchanged anterior displacement as described. Patient is status post posterior fusion from the occiput through C4. A previously demonstrated screw extending into the spinal canal at C2-3 has been removed. The hardware otherwise appears unchanged. There is limited purchase of the C3 and C4 laminar screws, especially on the left. There is some lucency surrounding the left C4 screw. The posterior arch of C1 has been resected is adequate decompression the craniocervical junction. Soft tissues and spinal canal: No prevertebral fluid or swelling. No visible canal hematoma. Disc levels: There is multilevel cervical spondylosis with disc space narrowing and uncinate spurring. Prominent posterior osteophytes remain at C3-4, contributing to residual central stenosis. The left C4-5 facet joint is ankylosed. Upper chest: Mild biapical scarring. Other: None. IMPRESSION: 1. No acute intracranial findings. 2. No evidence of acute cervical spine fracture, traumatic subluxation or static signs of instability. 3. Stable anterolisthesis at C1-2 secondary to a chronic fracture involving the base of the dens. The craniocervical junction appears adequately decompressed, and there is possible early healing of the fracture. 4. Interval removal of the previously demonstrated C2 laminar screw extending into the spinal canal. The additional hardware is unchanged with limited osseous purchase of the C3 and C4 laminar screws. Electronically Signed   By: Carey BullocksWilliam  Veazey M.D.   On: 05/17/2019 13:51   Ct Cervical Spine Wo Contrast  Result Date: 05/17/2019 CLINICAL DATA:  Difficulty speaking since yesterday patient fell 2 weeks ago. Right facial bruising. EXAM: CT HEAD WITHOUT CONTRAST CT CERVICAL SPINE WITHOUT CONTRAST TECHNIQUE: Multidetector CT imaging of the head and cervical spine was performed following the standard protocol without intravenous contrast.  Multiplanar CT image reconstructions of the cervical spine were also generated. COMPARISON:  Cervical spine CT 02/10/2019. FINDINGS: CT HEAD FINDINGS Brain: There is no evidence of acute intracranial hemorrhage, mass lesion, brain edema or extra-axial fluid collection. There is mild atrophy with prominence of the ventricles and subarachnoid spaces. There is patchy low-density in the periventricular white matter which is most likely due to chronic small vessel ischemic changes. There is no CT evidence of acute cortical infarction. Vascular: Intracranial vascular calcifications. No hyperdense vessel identified. Skull: Negative for fracture or focal lesion. There are postsurgical changes  related to craniocervical fusion posteriorly. The hardware is intact. Sinuses/Orbits: The visualized paranasal sinuses and mastoid air cells are clear. No orbital abnormalities are seen. Previous scleral banding. Other: None. CT CERVICAL SPINE FINDINGS Alignment: 7 mm anterolisthesis at C1-2 secondary to a fracture of the base of the dens. Minimal degenerative anterolisthesis at C7-T1. Skull base and vertebrae: No evidence of acute fracture. Possible partial healing of the dens fracture with unchanged anterior displacement as described. Patient is status post posterior fusion from the occiput through C4. A previously demonstrated screw extending into the spinal canal at C2-3 has been removed. The hardware otherwise appears unchanged. There is limited purchase of the C3 and C4 laminar screws, especially on the left. There is some lucency surrounding the left C4 screw. The posterior arch of C1 has been resected is adequate decompression the craniocervical junction. Soft tissues and spinal canal: No prevertebral fluid or swelling. No visible canal hematoma. Disc levels: There is multilevel cervical spondylosis with disc space narrowing and uncinate spurring. Prominent posterior osteophytes remain at C3-4, contributing to residual central  stenosis. The left C4-5 facet joint is ankylosed. Upper chest: Mild biapical scarring. Other: None. IMPRESSION: 1. No acute intracranial findings. 2. No evidence of acute cervical spine fracture, traumatic subluxation or static signs of instability. 3. Stable anterolisthesis at C1-2 secondary to a chronic fracture involving the base of the dens. The craniocervical junction appears adequately decompressed, and there is possible early healing of the fracture. 4. Interval removal of the previously demonstrated C2 laminar screw extending into the spinal canal. The additional hardware is unchanged with limited osseous purchase of the C3 and C4 laminar screws. Electronically Signed   By: Carey Bullocks M.D.   On: 05/17/2019 13:51   Mr Angio Head Wo Contrast  Result Date: 05/18/2019 CLINICAL DATA:  Speech difficulties.  Fall 2 weeks ago. EXAM: MR HEAD WITHOUT CONTRAST MR CIRCLE OF WILLIS WITHOUT CONTRAST MRA OF THE NECK WITHOUT AND WITH CONTRAST TECHNIQUE: Multiplanar, multiecho pulse sequences of the brain, circle of willis and surrounding structures were obtained without intravenous contrast. Angiographic images of the neck were obtained using MRA technique without and with intravenous contrast. CONTRAST:  6 mL Gadavist COMPARISON:  Head CT 05/17/2019 FINDINGS: MRI HEAD FINDINGS BRAIN: The midline structures are normal. There is no acute infarct, acute hemorrhage or mass. Early confluent hyperintense T2-weighted signal of the periventricular and deep white matter, most commonly due to chronic ischemic microangiopathy. Generalized atrophy without lobar predilection. Blood-sensitive sequences show no chronic microhemorrhage or superficial siderosis. SKULL AND UPPER CERVICAL SPINE: Posterior craniocervical fusion. SINUSES/ORBITS: No fluid levels or advanced mucosal thickening. No mastoid or middle ear effusion. The orbits are normal. MRA HEAD FINDINGS POSTERIOR CIRCULATION: --Vertebral arteries: Normal V4 segments.  --Posterior inferior cerebellar arteries (PICA): Patent origins from the vertebral arteries. --Anterior inferior cerebellar arteries (AICA): Patent origins from the basilar artery. --Basilar artery: Normal. --Superior cerebellar arteries: Normal. --Posterior cerebral arteries (PCA): Normal. The right PCA is predominantly supplied by the posterior communicating artery. ANTERIOR CIRCULATION: --Intracranial internal carotid arteries: Normal. --Anterior cerebral arteries (ACA): Normal. Both A1 segments are present. Patent anterior communicating artery (a-comm). --Middle cerebral arteries (MCA): Normal. MRA NECK FINDINGS Aortic arch: Normal 3 vessel aortic branching pattern. The visualized subclavian arteries are normal. Right carotid system: Normal course and caliber without stenosis or evidence of dissection. Left carotid system: Normal course and caliber without stenosis or evidence of dissection. Vertebral arteries: Left dominant. Vertebral artery origins are normal. Vertebral arteries are normal in course and  caliber to the vertebrobasilar confluence without stenosis or evidence of dissection. IMPRESSION: 1. Chronic ischemic microangiopathy and generalized volume loss without acute intracranial abnormality. 2. Normal cervical and intracranial MRA. Electronically Signed   By: Deatra RobinsonKevin  Herman M.D.   On: 05/18/2019 04:43   Mr Angio Neck W Wo Contrast  Result Date: 05/18/2019 CLINICAL DATA:  Speech difficulties.  Fall 2 weeks ago. EXAM: MR HEAD WITHOUT CONTRAST MR CIRCLE OF WILLIS WITHOUT CONTRAST MRA OF THE NECK WITHOUT AND WITH CONTRAST TECHNIQUE: Multiplanar, multiecho pulse sequences of the brain, circle of willis and surrounding structures were obtained without intravenous contrast. Angiographic images of the neck were obtained using MRA technique without and with intravenous contrast. CONTRAST:  6 mL Gadavist COMPARISON:  Head CT 05/17/2019 FINDINGS: MRI HEAD FINDINGS BRAIN: The midline structures are normal.  There is no acute infarct, acute hemorrhage or mass. Early confluent hyperintense T2-weighted signal of the periventricular and deep white matter, most commonly due to chronic ischemic microangiopathy. Generalized atrophy without lobar predilection. Blood-sensitive sequences show no chronic microhemorrhage or superficial siderosis. SKULL AND UPPER CERVICAL SPINE: Posterior craniocervical fusion. SINUSES/ORBITS: No fluid levels or advanced mucosal thickening. No mastoid or middle ear effusion. The orbits are normal. MRA HEAD FINDINGS POSTERIOR CIRCULATION: --Vertebral arteries: Normal V4 segments. --Posterior inferior cerebellar arteries (PICA): Patent origins from the vertebral arteries. --Anterior inferior cerebellar arteries (AICA): Patent origins from the basilar artery. --Basilar artery: Normal. --Superior cerebellar arteries: Normal. --Posterior cerebral arteries (PCA): Normal. The right PCA is predominantly supplied by the posterior communicating artery. ANTERIOR CIRCULATION: --Intracranial internal carotid arteries: Normal. --Anterior cerebral arteries (ACA): Normal. Both A1 segments are present. Patent anterior communicating artery (a-comm). --Middle cerebral arteries (MCA): Normal. MRA NECK FINDINGS Aortic arch: Normal 3 vessel aortic branching pattern. The visualized subclavian arteries are normal. Right carotid system: Normal course and caliber without stenosis or evidence of dissection. Left carotid system: Normal course and caliber without stenosis or evidence of dissection. Vertebral arteries: Left dominant. Vertebral artery origins are normal. Vertebral arteries are normal in course and caliber to the vertebrobasilar confluence without stenosis or evidence of dissection. IMPRESSION: 1. Chronic ischemic microangiopathy and generalized volume loss without acute intracranial abnormality. 2. Normal cervical and intracranial MRA. Electronically Signed   By: Deatra RobinsonKevin  Herman M.D.   On: 05/18/2019 04:43   Mr  Brain Wo Contrast  Result Date: 05/18/2019 CLINICAL DATA:  Speech difficulties.  Fall 2 weeks ago. EXAM: MR HEAD WITHOUT CONTRAST MR CIRCLE OF WILLIS WITHOUT CONTRAST MRA OF THE NECK WITHOUT AND WITH CONTRAST TECHNIQUE: Multiplanar, multiecho pulse sequences of the brain, circle of willis and surrounding structures were obtained without intravenous contrast. Angiographic images of the neck were obtained using MRA technique without and with intravenous contrast. CONTRAST:  6 mL Gadavist COMPARISON:  Head CT 05/17/2019 FINDINGS: MRI HEAD FINDINGS BRAIN: The midline structures are normal. There is no acute infarct, acute hemorrhage or mass. Early confluent hyperintense T2-weighted signal of the periventricular and deep white matter, most commonly due to chronic ischemic microangiopathy. Generalized atrophy without lobar predilection. Blood-sensitive sequences show no chronic microhemorrhage or superficial siderosis. SKULL AND UPPER CERVICAL SPINE: Posterior craniocervical fusion. SINUSES/ORBITS: No fluid levels or advanced mucosal thickening. No mastoid or middle ear effusion. The orbits are normal. MRA HEAD FINDINGS POSTERIOR CIRCULATION: --Vertebral arteries: Normal V4 segments. --Posterior inferior cerebellar arteries (PICA): Patent origins from the vertebral arteries. --Anterior inferior cerebellar arteries (AICA): Patent origins from the basilar artery. --Basilar artery: Normal. --Superior cerebellar arteries: Normal. --Posterior cerebral arteries (PCA): Normal. The  right PCA is predominantly supplied by the posterior communicating artery. ANTERIOR CIRCULATION: --Intracranial internal carotid arteries: Normal. --Anterior cerebral arteries (ACA): Normal. Both A1 segments are present. Patent anterior communicating artery (a-comm). --Middle cerebral arteries (MCA): Normal. MRA NECK FINDINGS Aortic arch: Normal 3 vessel aortic branching pattern. The visualized subclavian arteries are normal. Right carotid system:  Normal course and caliber without stenosis or evidence of dissection. Left carotid system: Normal course and caliber without stenosis or evidence of dissection. Vertebral arteries: Left dominant. Vertebral artery origins are normal. Vertebral arteries are normal in course and caliber to the vertebrobasilar confluence without stenosis or evidence of dissection. IMPRESSION: 1. Chronic ischemic microangiopathy and generalized volume loss without acute intracranial abnormality. 2. Normal cervical and intracranial MRA. Electronically Signed   By: Deatra Robinson M.D.   On: 05/18/2019 04:43     The results of significant diagnostics from this hospitalization (including imaging, microbiology, ancillary and laboratory) are listed below for reference.     Microbiology: Recent Results (from the past 240 hour(s))  SARS Coronavirus 2 [facility] order, Performed in [facility] hospital lab) Nasopharyngeal Nasopharyngeal Swab     Status: None   Collection Time: 05/17/19  6:18 PM   Specimen: Nasopharyngeal Swab  Result Value Ref Range Status   SARS Coronavirus 2 NEGATIVE NEGATIVE Final    Comment: (NOTE) If result is NEGATIVE SARS-CoV-2 target nucleic acids are NOT DETECTED. The SARS-CoV-2 RNA is generally detectable in upper and lower  respiratory specimens during the acute phase of infection. The lowest  concentration of SARS-CoV-2 viral copies this assay can detect is 250  copies / mL. A negative result does not preclude SARS-CoV-2 infection  and should not be used as the sole basis for treatment or other  patient management decisions.  A negative result may occur with  improper specimen collection / handling, submission of specimen other  than nasopharyngeal swab, presence of viral mutation(s) within the  areas targeted by this assay, and inadequate number of viral copies  (<250 copies / mL). A negative result must be combined with clinical  observations, patient history, and epidemiological  information. If result is POSITIVE SARS-CoV-2 target nucleic acids are DETECTED. The SARS-CoV-2 RNA is generally detectable in upper and lower  respiratory specimens dur ing the acute phase of infection.  Positive  results are indicative of active infection with SARS-CoV-2.  Clinical  correlation with patient history and other diagnostic information is  necessary to determine patient infection status.  Positive results do  not rule out bacterial infection or co-infection with other viruses. If result is PRESUMPTIVE POSTIVE SARS-CoV-2 nucleic acids MAY BE PRESENT.   A presumptive positive result was obtained on the submitted specimen  and confirmed on repeat testing.  While 2019 novel coronavirus  (SARS-CoV-2) nucleic acids may be present in the submitted sample  additional confirmatory testing may be necessary for epidemiological  and / or clinical management purposes  to differentiate between  SARS-CoV-2 and other Sarbecovirus currently known to infect humans.  If clinically indicated additional testing with an alternate test  methodology [phone]) is advised. The SARS-CoV-2 RNA is generally  detectable in upper and lower respiratory sp ecimens during the acute  phase of infection. The expected result is Negative. Fact Sheet for Patients:  BoilerBrush.com.cy Fact Sheet for Healthcare Providers: https://pope.com/ This test is not yet approved or cleared by the [place] FDA and has been authorized for detection and/or diagnosis of SARS-CoV-2 by FDA under an Emergency Use Authorization (EUA).  This EUA  will remain in effect (meaning this test can be used) for the duration of the COVID-19 declaration under Section 564(b)(1) of the Act, 21 U.S.C. section 360bbb-3(b)(1), unless the authorization is terminated or revoked sooner. Performed at [facility], [address] [place], [place] [address]      Labs: BNP (last 3 results) No  results for input(s): BNP in the last 8760 hours. Basic Metabolic Panel: Recent Labs  Lab 05/17/19 1531 05/17/19 1533 05/18/19 0906 05/19/19 0619  NA 141 141 140 143  K 4.5 3.9 3.6 3.8  CL 106 108 105 109  CO2  --  GLUCOSE 85 91 81 97  BUN CREATININE 0.50 0.55 0.64 0.65  CALCIUM  --  9.2 9.2 8.9   Liver Function Tests: Recent Labs  Lab 05/17/19 1533  AST 20  ALT 10  ALKPHOS 91  BILITOT 0.9  PROT 7.3  ALBUMIN 4.0   No results for input(s): LIPASE, AMYLASE in the last 168 hours. No results for input(s): AMMONIA in the last 168 hours. CBC: Recent Labs  Lab 05/17/19 1302 05/17/19 1531 05/18/19 0906  WBC 4.6  --  3.7*  NEUTROABS 3.2  --  2.2  HGB 11.9* 12.2 11.9*  HCT 38.0 36.0 37.2  MCV 92.0  --  91.6  PLT 190  --  189   Cardiac Enzymes: No results for input(s): CKTOTAL, CKMB, CKMBINDEX, TROPONINI in the last 168 hours. BNP: Invalid input(s): POCBNP CBG: Recent Labs  Lab 05/17/19 1501  GLUCAP 86   D-Dimer No results for input(s): DDIMER in the last 72 hours. Hgb A1c Recent Labs    05/18/19 0530  HGBA1C 5.2   Lipid Profile Recent Labs    05/18/19 0530  CHOL 148  HDL 58  LDLCALC 76  TRIG 70  CHOLHDL 2.6   Thyroid function studies No results for input(s): TSH, T4TOTAL, T3FREE, THYROIDAB in the last 72 hours.  Invalid input(s): FREET3 Anemia work up No results for input(s): VITAMINB12, FOLATE, FERRITIN, TIBC, IRON, RETICCTPCT in the last 72 hours. Urinalysis    Component Value Date/Time   COLORURINE YELLOW 02/23/2019 1900   APPEARANCEUR CLOUDY (A) 02/23/2019 1900   LABSPEC 1.013 02/23/2019 1900   PHURINE 6.0 02/23/2019 1900   GLUCOSEU NEGATIVE 02/23/2019 1900   HGBUR SMALL (A) 02/23/2019 1900   BILIRUBINUR NEGATIVE 02/23/2019 1900   KETONESUR NEGATIVE 02/23/2019 1900   PROTEINUR NEGATIVE 02/23/2019 1900   NITRITE POSITIVE (A) 02/23/2019 1900   LEUKOCYTESUR LARGE (A) 02/23/2019 1900   Sepsis Labs Invalid  input(s): PROCALCITONIN,  WBC,  LACTICIDVEN Microbiology Recent Results (from the past 240 hour(s))  SARS Coronavirus 2 [facility] order, Performed in [facility] hospital lab) Nasopharyngeal Nasopharyngeal Swab     Status: None   Collection Time: 05/17/19  6:18 PM   Specimen: Nasopharyngeal Swab  Result Value Ref Range Status   SARS Coronavirus 2 NEGATIVE NEGATIVE Final    Comment: (NOTE) If result is NEGATIVE SARS-CoV-2 target nucleic acids are NOT DETECTED. The SARS-CoV-2 RNA is generally detectable in upper and lower  respiratory specimens during the acute phase of infection. The lowest  concentration of SARS-CoV-2 viral copies this assay can detect is 250  copies / mL. A negative result does not preclude SARS-CoV-2 infection  and should not be used as the sole basis for treatment or other  patient management decisions.  A negative result may occur with  improper specimen collection / handling, submission of specimen other  than  nasopharyngeal swab, presence of viral mutation(s) within the  areas targeted by this assay, and inadequate number of viral copies  (<250 copies / mL). A negative result must be combined with clinical  observations, patient history, and epidemiological information. If result is POSITIVE SARS-CoV-2 target nucleic acids are DETECTED. The SARS-CoV-2 RNA is generally detectable in upper and lower  respiratory specimens dur ing the acute phase of infection.  Positive  results are indicative of active infection with SARS-CoV-2.  Clinical  correlation with patient history and other diagnostic information is  necessary to determine patient infection status.  Positive results do  not rule out bacterial infection or co-infection with other viruses. If result is PRESUMPTIVE POSTIVE SARS-CoV-2 nucleic acids MAY BE PRESENT.   A presumptive positive result was obtained on the submitted specimen  and confirmed on repeat testing.  While 2019 novel coronavirus   (SARS-CoV-2) nucleic acids may be present in the submitted sample  additional confirmatory testing may be necessary for epidemiological  and / or clinical management purposes  to differentiate between  SARS-CoV-2 and other Sarbecovirus currently known to infect humans.  If clinically indicated additional testing with an alternate test  methodology [phone]) is advised. The SARS-CoV-2 RNA is generally  detectable in upper and lower respiratory sp ecimens during the acute  phase of infection. The expected result is Negative. Fact Sheet for Patients:  StrictlyIdeas.no Fact Sheet for Healthcare Providers: BankingDealers.co.za This test is not yet approved or cleared by the [place] FDA and has been authorized for detection and/or diagnosis of SARS-CoV-2 by FDA under an Emergency Use Authorization (EUA).  This EUA will remain in effect (meaning this test can be used) for the duration of the COVID-19 declaration under Section 564(b)(1) of the Act, 21 U.S.C. section 360bbb-3(b)(1), unless the authorization is terminated or revoked sooner. Performed at [facility], [address], [place], [place] [address]      Time coordinating discharge in minutes: 65  SIGNED:   Debbe Odea, MD  [facility] 05/19/2019, 12:34 PM Pager   If 7PM-7AM, please contact night-coverage [email or web address] Password [email or web address]

## 2019-05-19 NOTE — TOC Initial Note (Signed)
Transition of Care [facility]) - Initial/Assessment Note    Patient Details  Name: Denise Espinoza MRN: [id number] Date of Birth: 02-May-1930  [facility] CM/SW Contact:    Pollie Friar, RN Phone Number: 05/19/2019, 12:00 PM  Clinical Narrative:                 Pt was active with [facility] prior to admission. CM called Malachy Mood with [facility] and notified her of resumption orders.  Pt denies issues with home meds and with transportation. ToC following for d/c needs.  Expected Discharge Plan: [place] Barriers to Discharge: Continued Medical Work up   Patient Goals and CMS Choice   CMS Medicare.gov Compare Post Acute Care list provided to:: Patient Choice offered to / list presented to : Patient  Expected Discharge Plan and Services Expected Discharge Plan: [place]   Discharge Planning Services: CM Consult Post Acute Care Choice: Home Health                             HH Arranged: PT, OT, Social Work, Nurse's Aide [place] Agency: [place] Date [place]: 05/19/19   Representative spoke with at [place]: Malachy Mood  Prior Living Arrangements/Services   Lives with:: Other (Comment)(Granddaughter) Patient language and need for interpreter reviewed:: Yes(no needs) Do you feel safe going back to the place where you live?: Yes      Need for Family Participation in Patient Care: Yes (Comment)(24 hour supervision) Care giver support system in place?: Yes (comment)(Granddaughter able to provide needed supervision currently)   Criminal Activity/Legal Involvement Pertinent to Current Situation/Hospitalization: No - Comment as needed  Activities of Daily Living Home Assistive Devices/Equipment: Wheelchair, Environmental consultant (specify type), Cane (specify quad or straight), Brace (specify type), Scales, Built-in shower seat, Dentures (specify type), Grab bars around toilet, Grab bars in shower, Hand-held shower hose ADL  Screening (condition at time of admission) Patient's cognitive ability adequate to safely complete daily activities?: Yes Is the patient deaf or have difficulty hearing?: No Does the patient have difficulty seeing, even when wearing glasses/contacts?: No Does the patient have difficulty concentrating, remembering, or making decisions?: No Patient able to express need for assistance with ADLs?: Yes Does the patient have difficulty dressing or bathing?: No Independently performs ADLs?: Yes (appropriate for developmental age) Does the patient have difficulty walking or climbing stairs?: No Weakness of Legs: None Weakness of Arms/Hands: None  Permission Sought/Granted                  Emotional Assessment Appearance:: Appears stated age Attitude/Demeanor/Rapport: Engaged Affect (typically observed): Accepting, Pleasant Orientation: : Oriented to Self, Oriented to Place, Oriented to  Time, Oriented to Situation   Psych Involvement: No (comment)  Admission diagnosis:  Dysphasia [R47.02] Patient Active Problem List   Diagnosis Date Noted  . Syncope 05/18/2019  . Slurred speech 05/18/2019  . Seizures ([place])   . Word finding difficulty 05/17/2019  . Supraventricular tachycardia ([place]) 03/22/2019  . Dyslipidemia 03/22/2019  . GERD without esophagitis 03/22/2019  . Chronic constipation 03/22/2019  . Insomnia disorder 03/22/2019  . H/O Cervical  laminectomy 03/13/2019  . Acute lower UTI   . Acute blood loss anemia   . Orthostatic hypotension   . History of supraventricular tachycardia   . Central cord syndrome ([place]) 02/15/2019  . 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

## 2019-05-19 NOTE — Progress Notes (Signed)
Orthostatics completed, patient had a syncope episode during standing and a 2.64 second pause. MD notified

## 2019-05-19 NOTE — Progress Notes (Signed)
PROGRESS NOTE    Denise Espinoza   HQI:[id number]  DOB: 1930/09/08  DOA: 05/17/2019 PCP: Tacy Learn, FNP   Brief Narrative:  Denise Espinoza is a 85 y.o.femalewith medical history significant forcervical spine stenosis with myelopathy, central cord syndrome, SVT,was brought to the ED by family with reports of difficulty getting her words out and unable to finish sentences that started yesterday. Patient denies change in her vision, weakness of her extremities,abnormal sensation, facial droop.    Subjective: No complaints.     Assessment & Plan:   Difficulty with speech - Head and cervical CT in the ED were unremarkable - Neurology consult was requested in the ED and an MRI was ordered -MRI was negative for an acute CVA and showed chronic ischemic microangiopathy  - MRA also unremarkable-see full report below -2D echo noted below does not show any significant abnormalities that would result in syncope -EEG does not reveal any epileptiform discharges - Neurology started the patient on Keppra 500 mg twice daily with the thought that she may have had a seizure-her lethargic and confused state in the ED as suspected by neurology to be a postictal state -Neurology also recommended that bedtime trazodone be discontinued  - she should follow-up with neurology in 3 to 4 weeks as outpatient    Dizziness/orthostatic hypotension -Apparently she was on Florinef in the past and this was discontinued -She has received IV fluids for her orthostatic symptoms and Florinef has been resumed - yesterday she was able to walk 40 feet but today, while checking orthostatic vitals, she passed out and had a 2.64 sec pause.- may need to hold Metoprolol- will ask for cardiology eval - will increase Florinef to 0.2  Rib fracture - Chest x-ray on 8/8 revealed a Subacute mildly displaced fracture involving the eighth rib laterally on the left. -Patient had a fall about 2 weeks ago as well and it is  possible this fracture occurred at that time -No complaints of pain  History of SVT - Continue metoprolol  History of cervical spine stenosis with myelopathy/central cord syndrome status post surgery - displaced type II odontoid fracture requiring C1 laminectomy, C2 ORIF and occipital to C4 PLIF on 02/06/2019. Patient subsequently required removal of C2 translaminar screw on 02/11/2019 per Dr. Conchita Paris.  Hyperlipidemia -Continue Crestor  Gastroesophageal reflux disease - Continue PPI    Time spent in minutes: 35 DVT prophylaxis: Lovenox Code Status: DNI Family Communication:  Disposition Plan: home Consultants:   neurology Procedures:  EEG This study is within normal limits.  No seizures or epileptiform discharges were seen throughout the recording.  2D echo 1. The left ventricle has normal systolic function, with an ejection fraction of 55-60%. The cavity size was normal. There is moderately increased left ventricular wall thickness. Left ventricular diastolic parameters were normal. 2. The right ventricle has normal systolic function. The cavity was normal. There is no increase in right ventricular wall thickness. 3. Moderate thickening of the mitral valve leaflet. Mild calcification of the mitral valve leaflet. 4. Tricuspid valve regurgitation is mild-moderate. 5. The aortic valve is tricuspid. Moderate thickening of the aortic valve. Sclerosis without any evidence of stenosis of the aortic valve. Aortic valve regurgitation is mild by color flow Doppler. 6. The aorta is normal in size and structure. Antimicrobials:  Anti-infectives (From admission, onward)   Start     Dose/Rate Route Frequency Ordered Stop   05/17/19 1845  ciprofloxacin (CIPRO) tablet 500 mg  Status:  Discontinued     500 mg  Oral  Once 05/17/19 1844 05/17/19 1845   05/17/19 1845  metroNIDAZOLE (FLAGYL) tablet 500 mg  Status:  Discontinued     500 mg Oral  Once 05/17/19 1844 05/17/19 1845        Objective: Vitals:   05/18/19 2008 05/19/19 0053 05/19/19 0452 05/19/19 0811  BP: 131/75 138/66 128/62 (!) 144/72  Pulse: 69 68 60 69  Resp: 16 16 16 16   Temp: 98.1 F (36.7 C) (!) 97.5 F (36.4 C) 97.9 F (36.6 C) 98.1 F (36.7 C)  TempSrc: Oral Oral Oral Oral  SpO2: 94% 94% 96% 98%  Weight:      Height:        Intake/Output Summary (Last 24 hours) at 05/19/2019 1337 Last data filed at 05/18/2019 1700 Gross per 24 hour  Intake --  Output 400 ml  Net -400 ml   Filed Weights   05/17/19 1303 05/17/19 2250  Weight: 66.7 kg 64 kg    Examination: General exam: Appears comfortable  HEENT: PERRLA, oral mucosa moist, no sclera icterus or thrush Respiratory system: Clear to auscultation. Respiratory effort normal. Cardiovascular system: S1 & S2 heard, RRR.   Gastrointestinal system: Abdomen soft, non-tender, nondistended. Normal bowel sounds. Central nervous system: Alert and oriented. No focal neurological deficits. Extremities: No cyanosis, clubbing or edema Skin: No rashes or ulcers Psychiatry:  Mood & affect appropriate.     Data Reviewed: I have personally reviewed following labs and imaging studies  CBC: Recent Labs  Lab 05/17/19 1302 05/17/19 1531 05/18/19 0906  WBC 4.6  --  3.7*  NEUTROABS 3.2  --  2.2  HGB 11.9* 12.2 11.9*  HCT 38.0 36.0 37.2  MCV 92.0  --  91.6  PLT 190  --  027   Basic Metabolic Panel: Recent Labs  Lab 05/17/19 1531 05/17/19 1533 05/18/19 0906 05/19/19 0619  NA 141 141 140 143  K 4.5 3.9 3.6 3.8  CL 106 108 105 109  CO2  --  23 24 25   GLUCOSE 85 91 81 97  BUN 11 11 12 16   CREATININE 0.50 0.55 0.64 0.65  CALCIUM  --  9.2 9.2 8.9   GFR: Estimated Creatinine Clearance: 44.6 mL/min (by C-G formula based on SCr of 0.65 mg/dL). Liver Function Tests: Recent Labs  Lab 05/17/19 1533  AST 20  ALT 10  ALKPHOS 91  BILITOT 0.9  PROT 7.3  ALBUMIN 4.0   No results for input(s): LIPASE, AMYLASE in the last 168 hours. No  results for input(s): AMMONIA in the last 168 hours. Coagulation Profile: Recent Labs  Lab 05/17/19 1538  INR 1.1   Cardiac Enzymes: No results for input(s): CKTOTAL, CKMB, CKMBINDEX, TROPONINI in the last 168 hours. BNP (last 3 results) No results for input(s): PROBNP in the last 8760 hours. HbA1C: Recent Labs    05/18/19 0530  HGBA1C 5.2   CBG: Recent Labs  Lab 05/17/19 1501  GLUCAP 86   Lipid Profile: Recent Labs    05/18/19 0530  CHOL 148  HDL 58  LDLCALC 76  TRIG 70  CHOLHDL 2.6   Thyroid Function Tests: No results for input(s): TSH, T4TOTAL, FREET4, T3FREE, THYROIDAB in the last 72 hours. Anemia Panel: No results for input(s): VITAMINB12, FOLATE, FERRITIN, TIBC, IRON, RETICCTPCT in the last 72 hours. Urine analysis:    Component Value Date/Time   COLORURINE YELLOW 02/23/2019 1900   APPEARANCEUR CLOUDY (A) 02/23/2019 1900   LABSPEC 1.013 02/23/2019 1900   PHURINE 6.0 02/23/2019 1900   GLUCOSEU  NEGATIVE 02/23/2019 1900   HGBUR SMALL (A) 02/23/2019 1900   BILIRUBINUR NEGATIVE 02/23/2019 1900   KETONESUR NEGATIVE 02/23/2019 1900   PROTEINUR NEGATIVE 02/23/2019 1900   NITRITE POSITIVE (A) 02/23/2019 1900   LEUKOCYTESUR LARGE (A) 02/23/2019 1900   Sepsis Labs: @LABRCNTIP (procalcitonin:4,lacticidven:4) ) Recent Results (from the past 240 hour(s))  SARS Coronavirus 2 [facility] order, Performed in [facility] hospital lab) Nasopharyngeal Nasopharyngeal Swab     Status: None   Collection Time: 05/17/19  6:18 PM   Specimen: Nasopharyngeal Swab  Result Value Ref Range Status   SARS Coronavirus 2 NEGATIVE NEGATIVE Final    Comment: (NOTE) If result is NEGATIVE SARS-CoV-2 target nucleic acids are NOT DETECTED. The SARS-CoV-2 RNA is generally detectable in upper and lower  respiratory specimens during the acute phase of infection. The lowest  concentration of SARS-CoV-2 viral copies this assay can detect is 250  copies / mL. A negative result does not  preclude SARS-CoV-2 infection  and should not be used as the sole basis for treatment or other  patient management decisions.  A negative result may occur with  improper specimen collection / handling, submission of specimen other  than nasopharyngeal swab, presence of viral mutation(s) within the  areas targeted by this assay, and inadequate number of viral copies  (<250 copies / mL). A negative result must be combined with clinical  observations, patient history, and epidemiological information. If result is POSITIVE SARS-CoV-2 target nucleic acids are DETECTED. The SARS-CoV-2 RNA is generally detectable in upper and lower  respiratory specimens dur ing the acute phase of infection.  Positive  results are indicative of active infection with SARS-CoV-2.  Clinical  correlation with patient history and other diagnostic information is  necessary to determine patient infection status.  Positive results do  not rule out bacterial infection or co-infection with other viruses. If result is PRESUMPTIVE POSTIVE SARS-CoV-2 nucleic acids MAY BE PRESENT.   A presumptive positive result was obtained on the submitted specimen  and confirmed on repeat testing.  While 2019 novel coronavirus  (SARS-CoV-2) nucleic acids may be present in the submitted sample  additional confirmatory testing may be necessary for epidemiological  and / or clinical management purposes  to differentiate between  SARS-CoV-2 and other Sarbecovirus currently known to infect humans.  If clinically indicated additional testing with an alternate test  methodology [phone]) is advised. The SARS-CoV-2 RNA is generally  detectable in upper and lower respiratory sp ecimens during the acute  phase of infection. The expected result is Negative. Fact Sheet for Patients:  (YHC6237 Fact Sheet for Healthcare Providers: BoilerBrush.com.cy This test is not yet approved or cleared by  the https://pope.com/ FDA and has been authorized for detection and/or diagnosis of SARS-CoV-2 by FDA under an Emergency Use Authorization (EUA).  This EUA will remain in effect (meaning this test can be used) for the duration of the COVID-19 declaration under Section 564(b)(1) of the Act, 21 U.S.C. section 360bbb-3(b)(1), unless the authorization is terminated or revoked sooner. Performed at [facility], [address] [place], [place]          Radiology Studies: Dg Ribs Unilateral W/chest Left  Result Date: 05/17/2019 CLINICAL DATA:  Pain status post fall EXAM: LEFT RIBS AND CHEST - 3+ VIEW COMPARISON:  For Feb 16, 2019 FINDINGS: The heart size is mildly enlarged. Aortic calcifications are noted. There is some mild but stable height loss involving the midthoracic spine. There is no pneumothorax. There is a subacute appearing mildly  displaced fracture involving the eighth rib laterally on the left. There is no additional acute displaced fracture or dislocation. IMPRESSION: 1. Subacute mildly displaced fracture involving the eighth rib laterally on the left. 2. No pneumothorax. 3. No definite acute cardiopulmonary process. Electronically Signed   By: Katherine Mantle M.D.   On: 05/17/2019 19:29   Mr Angio Head Wo Contrast  Result Date: 05/18/2019 CLINICAL DATA:  Speech difficulties.  Fall 2 weeks ago. EXAM: MR HEAD WITHOUT CONTRAST MR CIRCLE OF WILLIS WITHOUT CONTRAST MRA OF THE NECK WITHOUT AND WITH CONTRAST TECHNIQUE: Multiplanar, multiecho pulse sequences of the brain, circle of willis and surrounding structures were obtained without intravenous contrast. Angiographic images of the neck were obtained using MRA technique without and with intravenous contrast. CONTRAST:  6 mL Gadavist COMPARISON:  Head CT 05/17/2019 FINDINGS: MRI HEAD FINDINGS BRAIN: The midline structures are normal. There is no acute infarct, acute hemorrhage or mass. Early confluent hyperintense T2-weighted signal of  the periventricular and deep white matter, most commonly due to chronic ischemic microangiopathy. Generalized atrophy without lobar predilection. Blood-sensitive sequences show no chronic microhemorrhage or superficial siderosis. SKULL AND UPPER CERVICAL SPINE: Posterior craniocervical fusion. SINUSES/ORBITS: No fluid levels or advanced mucosal thickening. No mastoid or middle ear effusion. The orbits are normal. MRA HEAD FINDINGS POSTERIOR CIRCULATION: --Vertebral arteries: Normal V4 segments. --Posterior inferior cerebellar arteries (PICA): Patent origins from the vertebral arteries. --Anterior inferior cerebellar arteries (AICA): Patent origins from the basilar artery. --Basilar artery: Normal. --Superior cerebellar arteries: Normal. --Posterior cerebral arteries (PCA): Normal. The right PCA is predominantly supplied by the posterior communicating artery. ANTERIOR CIRCULATION: --Intracranial internal carotid arteries: Normal. --Anterior cerebral arteries (ACA): Normal. Both A1 segments are present. Patent anterior communicating artery (a-comm). --Middle cerebral arteries (MCA): Normal. MRA NECK FINDINGS Aortic arch: Normal 3 vessel aortic branching pattern. The visualized subclavian arteries are normal. Right carotid system: Normal course and caliber without stenosis or evidence of dissection. Left carotid system: Normal course and caliber without stenosis or evidence of dissection. Vertebral arteries: Left dominant. Vertebral artery origins are normal. Vertebral arteries are normal in course and caliber to the vertebrobasilar confluence without stenosis or evidence of dissection. IMPRESSION: 1. Chronic ischemic microangiopathy and generalized volume loss without acute intracranial abnormality. 2. Normal cervical and intracranial MRA. Electronically Signed   By: Deatra Robinson M.D.   On: 05/18/2019 04:43   Mr Angio Neck W Wo Contrast  Result Date: 05/18/2019 CLINICAL DATA:  Speech difficulties.  Fall 2 weeks  ago. EXAM: MR HEAD WITHOUT CONTRAST MR CIRCLE OF WILLIS WITHOUT CONTRAST MRA OF THE NECK WITHOUT AND WITH CONTRAST TECHNIQUE: Multiplanar, multiecho pulse sequences of the brain, circle of willis and surrounding structures were obtained without intravenous contrast. Angiographic images of the neck were obtained using MRA technique without and with intravenous contrast. CONTRAST:  6 mL Gadavist COMPARISON:  Head CT 05/17/2019 FINDINGS: MRI HEAD FINDINGS BRAIN: The midline structures are normal. There is no acute infarct, acute hemorrhage or mass. Early confluent hyperintense T2-weighted signal of the periventricular and deep white matter, most commonly due to chronic ischemic microangiopathy. Generalized atrophy without lobar predilection. Blood-sensitive sequences show no chronic microhemorrhage or superficial siderosis. SKULL AND UPPER CERVICAL SPINE: Posterior craniocervical fusion. SINUSES/ORBITS: No fluid levels or advanced mucosal thickening. No mastoid or middle ear effusion. The orbits are normal. MRA HEAD FINDINGS POSTERIOR CIRCULATION: --Vertebral arteries: Normal V4 segments. --Posterior inferior cerebellar arteries (PICA): Patent origins from the vertebral arteries. --Anterior inferior cerebellar arteries (AICA): Patent origins from the basilar artery. --  Basilar artery: Normal. --Superior cerebellar arteries: Normal. --Posterior cerebral arteries (PCA): Normal. The right PCA is predominantly supplied by the posterior communicating artery. ANTERIOR CIRCULATION: --Intracranial internal carotid arteries: Normal. --Anterior cerebral arteries (ACA): Normal. Both A1 segments are present. Patent anterior communicating artery (a-comm). --Middle cerebral arteries (MCA): Normal. MRA NECK FINDINGS Aortic arch: Normal 3 vessel aortic branching pattern. The visualized subclavian arteries are normal. Right carotid system: Normal course and caliber without stenosis or evidence of dissection. Left carotid system: Normal  course and caliber without stenosis or evidence of dissection. Vertebral arteries: Left dominant. Vertebral artery origins are normal. Vertebral arteries are normal in course and caliber to the vertebrobasilar confluence without stenosis or evidence of dissection. IMPRESSION: 1. Chronic ischemic microangiopathy and generalized volume loss without acute intracranial abnormality. 2. Normal cervical and intracranial MRA. Electronically Signed   By: Deatra RobinsonKevin  Herman M.D.   On: 05/18/2019 04:43   Mr Brain Wo Contrast  Result Date: 05/18/2019 CLINICAL DATA:  Speech difficulties.  Fall 2 weeks ago. EXAM: MR HEAD WITHOUT CONTRAST MR CIRCLE OF WILLIS WITHOUT CONTRAST MRA OF THE NECK WITHOUT AND WITH CONTRAST TECHNIQUE: Multiplanar, multiecho pulse sequences of the brain, circle of willis and surrounding structures were obtained without intravenous contrast. Angiographic images of the neck were obtained using MRA technique without and with intravenous contrast. CONTRAST:  6 mL Gadavist COMPARISON:  Head CT 05/17/2019 FINDINGS: MRI HEAD FINDINGS BRAIN: The midline structures are normal. There is no acute infarct, acute hemorrhage or mass. Early confluent hyperintense T2-weighted signal of the periventricular and deep white matter, most commonly due to chronic ischemic microangiopathy. Generalized atrophy without lobar predilection. Blood-sensitive sequences show no chronic microhemorrhage or superficial siderosis. SKULL AND UPPER CERVICAL SPINE: Posterior craniocervical fusion. SINUSES/ORBITS: No fluid levels or advanced mucosal thickening. No mastoid or middle ear effusion. The orbits are normal. MRA HEAD FINDINGS POSTERIOR CIRCULATION: --Vertebral arteries: Normal V4 segments. --Posterior inferior cerebellar arteries (PICA): Patent origins from the vertebral arteries. --Anterior inferior cerebellar arteries (AICA): Patent origins from the basilar artery. --Basilar artery: Normal. --Superior cerebellar arteries: Normal.  --Posterior cerebral arteries (PCA): Normal. The right PCA is predominantly supplied by the posterior communicating artery. ANTERIOR CIRCULATION: --Intracranial internal carotid arteries: Normal. --Anterior cerebral arteries (ACA): Normal. Both A1 segments are present. Patent anterior communicating artery (a-comm). --Middle cerebral arteries (MCA): Normal. MRA NECK FINDINGS Aortic arch: Normal 3 vessel aortic branching pattern. The visualized subclavian arteries are normal. Right carotid system: Normal course and caliber without stenosis or evidence of dissection. Left carotid system: Normal course and caliber without stenosis or evidence of dissection. Vertebral arteries: Left dominant. Vertebral artery origins are normal. Vertebral arteries are normal in course and caliber to the vertebrobasilar confluence without stenosis or evidence of dissection. IMPRESSION: 1. Chronic ischemic microangiopathy and generalized volume loss without acute intracranial abnormality. 2. Normal cervical and intracranial MRA. Electronically Signed   By: Deatra RobinsonKevin  Herman M.D.   On: 05/18/2019 04:43      Scheduled Meds:  aspirin EC  81 mg Oral Daily   enoxaparin (LOVENOX) injection  40 mg Subcutaneous Q24H   fludrocortisone  0.2 mg Oral Daily   gabapentin  300 mg Oral TID   levETIRAcetam  500 mg Oral BID   metoprolol succinate  12.5 mg Oral Daily   pantoprazole  40 mg Oral BID   polyethylene glycol  17 g Oral Daily   QUEtiapine  25 mg Oral QHS   rosuvastatin  5 mg Oral Daily   Continuous Infusions:  sodium chloride 75  mL/hr at 05/18/19 1903     LOS: 1 day      Calvert CantorSaima Terald Jump, MD [facility] Pager: [email or web address] Password [email or web address] 05/19/2019, 1:37 PM

## 2019-05-20 DIAGNOSIS — M4802 Spinal stenosis, cervical region: Secondary | ICD-10-CM

## 2019-05-20 DIAGNOSIS — I459 Conduction disorder, unspecified: Secondary | ICD-10-CM

## 2019-05-20 DIAGNOSIS — I495 Sick sinus syndrome: Principal | ICD-10-CM

## 2019-05-20 DIAGNOSIS — R001 Bradycardia, unspecified: Secondary | ICD-10-CM

## 2019-05-20 DIAGNOSIS — G992 Myelopathy in diseases classified elsewhere: Secondary | ICD-10-CM

## 2019-05-20 DIAGNOSIS — Z9889 Other specified postprocedural states: Secondary | ICD-10-CM

## 2019-05-20 NOTE — Progress Notes (Addendum)
Transfer note: While [facility] RN "Marci" rounding on pt,  noted h/o multiple episodes of pauses some 4-6 seconds through-out the day. Some episodes have been associated w/ syncope and/or dizziness.Rapid response note from just prior to shift change reviewed regarding pt requiring pacing pads. Spoke w/ Verdene Lennert, RN regarding pt. Though stable at this time given pt's persistent bradycardia and intermittent pauses of 4-6 seconds most of the day pt will be changed to progressive level of care until pace maker placement. Continue pacer pads. Cardiology aware and consulting.  Jeryl Columbia, NP-C [facility] Pager [phone]

## 2019-05-20 NOTE — Progress Notes (Signed)
DAILY PROGRESS NOTE   Patient Name: Denise Espinoza Date of Encounter: 05/20/2019 Cardiologist: Raechel Chute, MD  Chief Complaint   Dizziness  Patient Profile   Denise Espinoza is a 85 y.o. female with a hx of cervical spine stenosis w/ myelopathy, central cord syndrome, SVT, OA, who is being seen today for the evaluation of syncope, 2.64 sec pause at the request of Dr Wynelle Cleveland.  Subjective   She continues to feel dizzy. While talking with her at the bedside today, she had a staring event where she was unresponsive for a few seconds - shortly after the nurse came in noting that she had had a 4+ second pause.  Objective   Vitals:   05/19/19 1941 05/20/19 0006 05/20/19 0400 05/20/19 0725  BP: 132/80 (!) 144/65 (!) 141/69 (!) 143/69  Pulse: 82 72 64 68  Resp: 16 16 16 17   Temp: 98.7 F (37.1 C) 97.9 F (36.6 C) 99.1 F (37.3 C) 98.8 F (37.1 C)  TempSrc: Oral Oral  Axillary  SpO2: 97% 94% 93% 93%  Weight:      Height:       No intake or output data in the 24 hours ending 05/20/19 1053 Filed Weights   05/17/19 1303 05/17/19 2250  Weight: 66.7 kg 64 kg    Physical Exam   General appearance: alert and no distress Lungs: clear to auscultation bilaterally Heart: regular rate and rhythm Extremities: extremities normal, atraumatic, no cyanosis or edema Neurologic: Mental status: Alert, oriented, thought content appropriate  Inpatient Medications    Scheduled Meds: . aspirin EC  81 mg Oral Daily  . enoxaparin (LOVENOX) injection  40 mg Subcutaneous Q24H  . fludrocortisone  0.2 mg Oral Daily  . gabapentin  300 mg Oral TID  . levETIRAcetam  500 mg Oral BID  . pantoprazole  40 mg Oral BID  . polyethylene glycol  17 g Oral Daily  . QUEtiapine  25 mg Oral QHS  . rosuvastatin  5 mg Oral Daily    Continuous Infusions:   PRN Meds: acetaminophen **OR** acetaminophen (TYLENOL) oral liquid 160 mg/5 mL **OR** acetaminophen, senna-docusate   Labs   Results for orders placed  or performed during the hospital encounter of 05/17/19 (from the past 48 hour(s))  Basic metabolic panel     Status: None   Collection Time: 05/19/19  6:19 AM  Result Value Ref Range   Sodium 143 135 - 145 mmol/L   Potassium 3.8 3.5 - 5.1 mmol/L   Chloride 109 98 - 111 mmol/L   CO2 25 22 - 32 mmol/L   Glucose, Bld 97 70 - 99 mg/dL   BUN 16 8 - 23 mg/dL   Creatinine, Ser 0.65 0.44 - 1.00 mg/dL   Calcium 8.9 8.9 - 10.3 mg/dL   GFR calc non Af Amer >60 >60 mL/min   GFR calc Af Amer >60 >60 mL/min   Anion gap 9 5 - 15    Comment: Performed at [facility], [place] [address], [place], [place] [address]    ECG   N/A  Telemetry   Sinus rhythm/brady with 2-4 second pauses - Personally Reviewed  Radiology    No results found.  Cardiac Studies   Procedure: 2D Echo  Indications:    Stroke 434.91/I163.9   History:        Patient has no prior history of Echocardiogram examinations.                 SVT.   Sonographer:  Ross Ludwig RDCS (AE) Referring Phys: [id number] Heloise Beecham [facility]  IMPRESSIONS    1. The left ventricle has normal systolic function, with an ejection fraction of 55-60%. The cavity size was normal. There is moderately increased left ventricular wall thickness. Left ventricular diastolic parameters were normal.  2. The right ventricle has normal systolic function. The cavity was normal. There is no increase in right ventricular wall thickness.  3. Moderate thickening of the mitral valve leaflet. Mild calcification of the mitral valve leaflet.  4. Tricuspid valve regurgitation is mild-moderate.  5. The aortic valve is tricuspid. Moderate thickening of the aortic valve. Sclerosis without any evidence of stenosis of the aortic valve. Aortic valve regurgitation is mild by color flow Doppler.  6. The aorta is normal in size and structure.  Assessment   1. Principal Problem: 2.   Word finding difficulty 3. Active Problems: 4.   Stenosis of cervical spine with  myelopathy (HCC) 5.   Central cord syndrome (HCC) 6.   Orthostatic hypotension 7.   History of supraventricular tachycardia 8.   H/O Cervical  laminectomy 9.   Supraventricular tachycardia (HCC) 10.   GERD without esophagitis 11.   Chronic constipation 12.   Syncope 13.   Seizures (HCC) 14.   Slurred speech 15.   Plan   1. Noted to have stokes-adams syncopal event today while I was talking with her where she became unresponsive, associated with a 4+ second pause/asystole. Last Toprol dose was yesterday am at 10. D/w cardiac EP - they will see today for probable pacemaker placement as this is a likely etiology of her symptoms.  Time Spent Directly with Patient:  I have spent a total of 35 minutes with the patient reviewing hospital notes, telemetry, EKGs, labs and examining the patient as well as establishing an assessment and plan that was discussed personally with the patient.  > 50% of time was spent in direct patient care.  Length of Stay:  LOS: 2 days   Chrystie Nose, MD, [facility], FACP  [place]  [facility]  Medical Director of the [facility] Lipid [facility] &  Cardiovascular Risk Reduction Clinic Diplomate of the American Board of Clinical Lipidology Attending Cardiologist  Direct Dial: [phone]  Fax: [phone]  Website:  [email or web address]  05/20/2019, 10:53 AM

## 2019-05-20 NOTE — Significant Event (Signed)
Rapid Response Event Note  Overview: Cardiac - Periods of Bradycardia/Pauses/Syncope  Initial Focused Assessment: While I was rounding on 3W, nurses were concerned about patient needing pacing pads. Per nurse, MD asked that patient have pacing/defib pads. I went to Ms. Venia Minks, she was alert and oriented, very pleasant, endorse having some "sinus-like" pressure in her head but denied CP, denied being dizzy/lightheaded - but she currently laying in bed, + pulses - SR in the 70s-80s now. Not in acute distress, 99 % on RA  Interventions: -- Zoll Pads placed on patient.   Plan of Care: -- Discussed with patient and nurse that this is precautionary. Patient endorsed she understood -- Currently stable - should the need arise, RR RN can transcutaneously pace the patient.  -- Call RRT as needed   Event Summary:  Start Time 1125 End Time 1145   Ruther Ephraim R

## 2019-05-20 NOTE — Progress Notes (Addendum)
PROGRESS NOTE    Denise Espinoza   AOZ:[id number]  DOB: 05/24/1930  DOA: 05/17/2019 PCP: Tacy LearnGrabowski, Kristen, FNP   Brief Narrative:  Denise PilarOpal Desrochers is a 85 y.o.femalewith medical history significant forcervical spine stenosis with myelopathy, central cord syndrome, SVT,was brought to the ED by family with reports of difficulty getting her words out and unable to finish sentences that started yesterday. Patient denies change in her vision, weakness of her extremities,abnormal sensation, facial droop.    Subjective: No complaints.     Assessment & Plan:   Difficulty with speech - Head and cervical CT in the ED were unremarkable - Neurology consult was requested in the ED and an MRI was ordered -MRI was negative for an acute CVA and showed chronic ischemic microangiopathy  - MRA also unremarkable-see full report below -2D echo noted below does not show any significant abnormalities that would result in syncope -EEG does not reveal any epileptiform discharges - Neurology started the patient on Keppra 500 mg twice daily with the thought that she may have had a seizure-her lethargic and confused state in the ED as suspected by neurology to be a postictal state -Neurology also recommended that bed time trazodone be discontinued  - she should follow-up with neurology in 3 to 4 weeks as outpatient   Dizziness/orthostatic hypotension -Apparently she was on Florinef in the past and this was discontinued -She has received IV fluids for her orthostatic symptoms and Florinef has been resumed - 8/9 was able to walk 40 feet but 8/10, while checking orthostatic vitals, she passed out and had a 2.64 sec pause-  asked for cardiology eval- Metoprolol was discontinued -   increased Florinef to 0.2- BP still drops significantly when standing  Bradycardia/ syncope/History of SVT - see above about 2.64 sec pause yesterday - today she had a 6.28 second pause with syncope while Dr Rennis GoldenHilty was in the  room- EP is being called  Rib fracture - Chest x-ray on 8/8 revealed a Subacute mildly displaced fracture involving the eighth rib laterally on the left. -Patient had a fall about 2 weeks ago as well and it is possible this fracture occurred at that time -No complaints of pain  History of cervical spine stenosis with myelopathy/central cord syndrome status post surgery - displaced type II odontoid fracture requiring C1 laminectomy, C2 ORIF and occipital to C4 PLIF on 02/06/2019. Patient subsequently required removal of C2 translaminar screw on 02/11/2019 per Dr. Conchita ParisNundkumar.  Hyperlipidemia -Continue Crestor  Gastroesophageal reflux disease - Continue PPI    Time spent in minutes: 35 DVT prophylaxis: Lovenox Code Status: DNI Family Communication:  Disposition Plan: home with Home health after cardiology issues addressed Consultants:   neurology Procedures:  EEG This study is within normal limits.  No seizures or epileptiform discharges were seen throughout the recording.  2D echo 1. The left ventricle has normal systolic function, with an ejection fraction of 55-60%. The cavity size was normal. There is moderately increased left ventricular wall thickness. Left ventricular diastolic parameters were normal. 2. The right ventricle has normal systolic function. The cavity was normal. There is no increase in right ventricular wall thickness. 3. Moderate thickening of the mitral valve leaflet. Mild calcification of the mitral valve leaflet. 4. Tricuspid valve regurgitation is mild-moderate. 5. The aortic valve is tricuspid. Moderate thickening of the aortic valve. Sclerosis without any evidence of stenosis of the aortic valve. Aortic valve regurgitation is mild by color flow Doppler. 6. The aorta is normal in size and structure.  Antimicrobials:  Anti-infectives (From admission, onward)   Start     Dose/Rate Route Frequency Ordered Stop   05/17/19 1845  ciprofloxacin  (CIPRO) tablet 500 mg  Status:  Discontinued     500 mg Oral  Once 05/17/19 1844 05/17/19 1845   05/17/19 1845  metroNIDAZOLE (FLAGYL) tablet 500 mg  Status:  Discontinued     500 mg Oral  Once 05/17/19 1844 05/17/19 1845       Objective: Vitals:   05/19/19 1941 05/20/19 0006 05/20/19 0400 05/20/19 0725  BP: 132/80 (!) 144/65 (!) 141/69 (!) 143/69  Pulse: 82 72 64 68  Resp: 16 16 16 17   Temp: 98.7 F (37.1 C) 97.9 F (36.6 C) 99.1 F (37.3 C) 98.8 F (37.1 C)  TempSrc: Oral Oral  Axillary  SpO2: 97% 94% 93% 93%  Weight:      Height:       No intake or output data in the 24 hours ending 05/20/19 1118 Filed Weights   05/17/19 1303 05/17/19 2250  Weight: 66.7 kg 64 kg    Examination: General exam: Appears comfortable  HEENT: PERRLA, oral mucosa moist, no sclera icterus or thrush Respiratory system: Clear to auscultation. Respiratory effort normal. Cardiovascular system: S1 & S2 heard,  No murmurs  Gastrointestinal system: Abdomen soft, non-tender, nondistended. Normal bowel sounds   Central nervous system: Alert and oriented. No focal neurological deficits. Extremities: No cyanosis, clubbing or edema Skin: No rashes or ulcers Psychiatry:  Mood & affect appropriate.   Data Reviewed: I have personally reviewed following labs and imaging studies  CBC: Recent Labs  Lab 05/17/19 1302 05/17/19 1531 05/18/19 0906  WBC 4.6  --  3.7*  NEUTROABS 3.2  --  2.2  HGB 11.9* 12.2 11.9*  HCT 38.0 36.0 37.2  MCV 92.0  --  91.6  PLT 190  --  570   Basic Metabolic Panel: Recent Labs  Lab 05/17/19 1531 05/17/19 1533 05/18/19 0906 05/19/19 0619  NA 141 141 140 143  K 4.5 3.9 3.6 3.8  CL 106 108 105 109  CO2  --  23 24 25   GLUCOSE 85 91 81 97  BUN 11 11 12 16   CREATININE 0.50 0.55 0.64 0.65  CALCIUM  --  9.2 9.2 8.9   GFR: Estimated Creatinine Clearance: 44.6 mL/min (by C-G formula based on SCr of 0.65 mg/dL). Liver Function Tests: Recent Labs  Lab 05/17/19 1533   AST 20  ALT 10  ALKPHOS 91  BILITOT 0.9  PROT 7.3  ALBUMIN 4.0   No results for input(s): LIPASE, AMYLASE in the last 168 hours. No results for input(s): AMMONIA in the last 168 hours. Coagulation Profile: Recent Labs  Lab 05/17/19 1538  INR 1.1   Cardiac Enzymes: No results for input(s): CKTOTAL, CKMB, CKMBINDEX, TROPONINI in the last 168 hours. BNP (last 3 results) No results for input(s): PROBNP in the last 8760 hours. HbA1C: Recent Labs    05/18/19 0530  HGBA1C 5.2   CBG: Recent Labs  Lab 05/17/19 1501  GLUCAP 86   Lipid Profile: Recent Labs    05/18/19 0530  CHOL 148  HDL 58  LDLCALC 76  TRIG 70  CHOLHDL 2.6   Thyroid Function Tests: No results for input(s): TSH, T4TOTAL, FREET4, T3FREE, THYROIDAB in the last 72 hours. Anemia Panel: No results for input(s): VITAMINB12, FOLATE, FERRITIN, TIBC, IRON, RETICCTPCT in the last 72 hours. Urine analysis:    Component Value Date/Time   COLORURINE YELLOW 02/23/2019 1900  APPEARANCEUR CLOUDY (A) 02/23/2019 1900   LABSPEC 1.013 02/23/2019 1900   PHURINE 6.0 02/23/2019 1900   GLUCOSEU NEGATIVE 02/23/2019 1900   HGBUR SMALL (A) 02/23/2019 1900   BILIRUBINUR NEGATIVE 02/23/2019 1900   KETONESUR NEGATIVE 02/23/2019 1900   PROTEINUR NEGATIVE 02/23/2019 1900   NITRITE POSITIVE (A) 02/23/2019 1900   LEUKOCYTESUR LARGE (A) 02/23/2019 1900   Sepsis Labs: @LABRCNTIP (procalcitonin:4,lacticidven:4) ) Recent Results (from the past 240 hour(s))  SARS Coronavirus 2 [facility](Hospital order, Performed in [facility] lab) Nasopharyngeal Nasopharyngeal Swab     Status: None   Collection Time: 05/17/19  6:18 PM   Specimen: Nasopharyngeal Swab  Result Value Ref Range Status   SARS Coronavirus 2 NEGATIVE NEGATIVE Final    Comment: (NOTE) If result is NEGATIVE SARS-CoV-2 target nucleic acids are NOT DETECTED. The SARS-CoV-2 RNA is generally detectable in upper and lower  respiratory specimens during the acute phase of  infection. The lowest  concentration of SARS-CoV-2 viral copies this assay can detect is 250  copies / mL. A negative result does not preclude SARS-CoV-2 infection  and should not be used as the sole basis for treatment or other  patient management decisions.  A negative result may occur with  improper specimen collection / handling, submission of specimen other  than nasopharyngeal swab, presence of viral mutation(s) within the  areas targeted by this assay, and inadequate number of viral copies  (<250 copies / mL). A negative result must be combined with clinical  observations, patient history, and epidemiological information. If result is POSITIVE SARS-CoV-2 target nucleic acids are DETECTED. The SARS-CoV-2 RNA is generally detectable in upper and lower  respiratory specimens dur ing the acute phase of infection.  Positive  results are indicative of active infection with SARS-CoV-2.  Clinical  correlation with patient history and other diagnostic information is  necessary to determine patient infection status.  Positive results do  not rule out bacterial infection or co-infection with other viruses. If result is PRESUMPTIVE POSTIVE SARS-CoV-2 nucleic acids MAY BE PRESENT.   A presumptive positive result was obtained on the submitted specimen  and confirmed on repeat testing.  While 2019 novel coronavirus  (SARS-CoV-2) nucleic acids may be present in the submitted sample  additional confirmatory testing may be necessary for epidemiological  and / or clinical management purposes  to differentiate between  SARS-CoV-2 and other Sarbecovirus currently known to infect humans.  If clinically indicated additional testing with an alternate test  methodology [phone] is advised. The SARS-CoV-2 RNA is generally  detectable in upper and lower respiratory sp ecimens during the acute  phase of infection. The expected result is Negative. Fact Sheet for Patients:   BoilerBrush.com.cyhttps://www.fda.gov/media/136312/download Fact Sheet for Healthcare Providers: https://pope.com/https://www.fda.gov/media/136313/download This test is not yet approved or cleared by the [place] FDA and has been authorized for detection and/or diagnosis of SARS-CoV-2 by FDA under an Emergency Use Authorization (EUA).  This EUA will remain in effect (meaning this test can be used) for the duration of the COVID-19 declaration under Section 564(b)(1) of the Act, 21 U.S.C. section 360bbb-3(b)(1), unless the authorization is terminated or revoked sooner. Performed at [facility], [address] [place], [place] [address]          Radiology Studies: No results found.    Scheduled Meds: . aspirin EC  81 mg Oral Daily  . enoxaparin (LOVENOX) injection  40 mg Subcutaneous Q24H  . fludrocortisone  0.2 mg Oral Daily  . gabapentin  300 mg Oral TID  .  levETIRAcetam  500 mg Oral BID  . pantoprazole  40 mg Oral BID  . polyethylene glycol  17 g Oral Daily  . QUEtiapine  25 mg Oral QHS  . rosuvastatin  5 mg Oral Daily   Continuous Infusions:    LOS: 2 days      Calvert Cantor, MD [facility] Pager: [email or web address] Password [email or web address] 05/20/2019, 11:18 AM

## 2019-05-20 NOTE — Progress Notes (Signed)
PT Cancellation Note  Patient Details Name: Denise Espinoza MRN: [id number] DOB: August 27, 1930   Cancelled Treatment:    Reason Eval/Treat Not Completed: Medical issues which prohibited therapy. Per chart review, pt with another cardiac pause today and is currently being evaluated for pacemaker placement. Will continue to follow and progress as able when medically appropriate.   Thelma Comp 05/20/2019, 12:03 PM   Rolinda Roan, PT, DPT Acute Rehabilitation Services Pager: [phone] Office: [phone]

## 2019-05-20 NOTE — Plan of Care (Signed)
  Problem: Education: Goal: Knowledge of disease or condition will improve Outcome: Progressing Goal: Knowledge of secondary prevention will improve Outcome: Progressing Goal: Knowledge of patient specific risk factors addressed and post discharge goals established will improve Outcome: Progressing Goal: Individualized Educational Video(s) Outcome: Progressing   Problem: Coping: Goal: Will verbalize positive feelings about self Outcome: Progressing   Problem: Health Behavior/Discharge Planning: Goal: Ability to manage health-related needs will improve Outcome: Progressing   Problem: Self-Care: Goal: Ability to participate in self-care as condition permits will improve Outcome: Progressing Goal: Verbalization of feelings and concerns over difficulty with self-care will improve Outcome: Progressing Goal: Ability to communicate needs accurately will improve Outcome: Progressing   Problem: Nutrition: Goal: Risk of aspiration will decrease Outcome: Progressing   Problem: Intracerebral Hemorrhage Tissue Perfusion: Goal: Complications of Intracerebral Hemorrhage will be minimized Outcome: Progressing   Problem: Education: Goal: Knowledge of General Education information will improve Description: Including pain rating scale, medication(s)/side effects and non-pharmacologic comfort measures Outcome: Progressing   Problem: Health Behavior/Discharge Planning: Goal: Ability to manage health-related needs will improve Outcome: Progressing   Problem: Clinical Measurements: Goal: Ability to maintain clinical measurements within normal limits will improve Outcome: Progressing Goal: Will remain free from infection Outcome: Progressing Goal: Diagnostic test results will improve Outcome: Progressing   Problem: Activity: Goal: Risk for activity intolerance will decrease Outcome: Progressing   Problem: Nutrition: Goal: Adequate nutrition will be maintained Outcome: Progressing    Problem: Coping: Goal: Level of anxiety will decrease Outcome: Progressing   Problem: Elimination: Goal: Will not experience complications related to bowel motility Outcome: Progressing Goal: Will not experience complications related to urinary retention Outcome: Progressing   Problem: Pain Managment: Goal: General experience of comfort will improve Outcome: Progressing   Problem: Safety: Goal: Ability to remain free from injury will improve Outcome: Progressing   Problem: Skin Integrity: Goal: Risk for impaired skin integrity will decrease Outcome: Progressing   Ival Bible, BSN, RN

## 2019-05-20 NOTE — Consult Note (Addendum)
Cardiology Consultation:   Patient ID: Denise Espinoza MRN: [id number]; DOB: 1930-05-19  Admit date: 05/17/2019 Date of Consult: 05/20/2019  Primary Care Provider: Tacy Learn, FNP Primary Cardiologist: Laural Golden, MD [facility]) Primary Electrophysiologist:  None   Patient Profile:   Denise Espinoza is a 85 y.o. female with a hx of C-spine stenosis w/myelopathy, centra clord syndrome, OA, ?SVT who is being seen today for the evaluation of bradycardia and pauses at the request of [facility].  History of Present Illness:   Denise Espinoza most recent history is well layed out by cardiology consult and noted admitted 4/28-02/15/2023 displaced type II dens fracture with ORIF on 4/30.  --- She was discharged to CIR on 5/9 for significant functional deficits.  Follow-up CT showed C2 laminar screw intruding into the posterior spinal canal and right lateral recess.  It was removed on 5/5 but she continued to have declining worsening of numbness. ---  She was admitted to rehab 5/9-03/12/2019 for central cord syndrome.  She also had a UTI and anemia.  She was noted to have orthostatic hypotension and Florinef was started during this admission. ---    After discharge from rehab on 7/11, she was seen by Dr. Hermelinda Medicus on 7/29.  At that time, there was concern about lower extremity edema.  This was felt to be dependent edema but it was noted that gabapentin and Florinef could contribute to this so the Florinef was stopped and gabapentin was tapered.  She was noted to have a history of SVT and was having problems with presyncope.  She was to continue on metoprolol daily.  She was having sudden "dropouts", cause unclear, as they would start while she was seated. Cardiology referral made.  8/8, patient was admitted for syncopal episode.  Cardiology asked to evaluate.  Her Toprol was stopped yesterday, her last dose yesterday morning 1007  When she was admitted, orthostatic vital signs were only mildly positive. - The  Florinef was held, but by 8/10, her orthostatic vital signs were markedly positive. -The Florinef was restarted yesterday.  Today while Dr. Rennis Golden was with the patient be became unresponsive, the RN entered the room reporting associated pause on  Telemetry of over 4 seconds seconds.  EP is asked to the case.  No  LABS today 05/19/2019 K+ 3.8 BUN/Creat 16/0.65  05/18/2019 WBC 3.7 H/H 11.9/37.2 Plts 189  The patient reports seeing her cardiologist every 6 months, he always tells he he doesn't know whay she is there, doing well.  She does not recall any fast HR issues/history, was put on Toprol years ago for BP according to her had to cut the pill in 1/2 because of slow HR but has been better for years.  While I am with her her HR by bedside monitor (HR reading only) 50's-70's, she is AAO though says today she has felt more tired in general. She denies pain anywhere, no BM today though does not feel constipated, no clear vagal triggers noted Zoll pads are on the patient   Heart Pathway Score:     Past Medical History:  Diagnosis Date   Allergic rhinitis    Anosmia    Arthritis    Full dentures    History of diverticulitis    Insomnia    Senile osteoporosis    SOB (shortness of breath)    SVT (supraventricular tachycardia) (HCC)    Varicose vein of leg    left   Wears glasses     Past Surgical History:  Procedure Laterality Date  COLON RESECTION  1989   diverticulitis   COLONOSCOPY     ESOPHAGEAL DILATION  2015   endoscopy   POSTERIOR CERVICAL FUSION/FORAMINOTOMY N/A 02/11/2019   Procedure: POSTERIOR CERVICAL INSTRUMENTATION REMOVAL CERVICAL TWO;  Surgeon: Lisbeth Renshaw, MD;  Location: [facility] OR;  Service: Neurosurgery;  Laterality: N/A;   POSTERIOR CERVICAL LAMINECTOMY N/A 02/06/2019   Procedure: Cervical one LAMINECTOMY, Occiput-Cervical four fusion;  Surgeon: Lisbeth Renshaw, MD;  Location: [facility] OR;  Service: Neurosurgery;  Laterality: N/A;     Home  Medications:  Prior to Admission medications   Medication Sig Start Date End Date Taking? Authorizing Provider  calcium-vitamin D (OSCAL WITH D) 500-200 MG-UNIT per tablet Take 1 tablet by mouth 2 (two) times daily.    Yes [provider]  Dextran 70-Hypromellose (ARTIFICIAL TEARS) 0.1-0.3 % SOLN Place 1 drop into both eyes 3 (three) times daily as needed. 03/24/19  Yes [provider]  diclofenac sodium (VOLTAREN) 1 % GEL Apply 2 g topically 4 (four) times daily. 04/17/19  Yes Sharee Holster, NP  gabapentin (NEURONTIN) 300 MG capsule Take 1 capsule (300 mg total) by mouth 3 (three) times daily. 04/17/19  Yes Sharee Holster, NP  metoprolol succinate (TOPROL-XL) 25 MG 24 hr tablet Take 0.5 tablets (12.5 mg total) by mouth daily. 04/17/19  Yes Sharee Holster, NP  pantoprazole (PROTONIX) 40 MG tablet Take 1 tablet (40 mg total) by mouth 2 (two) times daily. 04/17/19  Yes Sharee Holster, NP  rosuvastatin (CRESTOR) 5 MG tablet Take 1 tablet (5 mg total) by mouth daily. 04/17/19  Yes Sharee Holster, NP  sennosides-docusate sodium (SENOKOT-S) 8.6-50 MG tablet Take 2 tablets by mouth 2 (two) times daily as needed for constipation. 03/24/19  Yes [provider]  traZODone (DESYREL) 50 MG tablet Take 0.5 tablets (25 mg total) by mouth at bedtime. 04/17/19  Yes Sharee Holster, NP  acetaminophen (TYLENOL) 325 MG tablet Take 1-2 tablets (325-650 mg total) by mouth every 4 (four) hours as needed for mild pain. 03/10/19   Love, Evlyn Kanner, PA-C  fludrocortisone (FLORINEF) 0.1 MG tablet Take 1 tablet (0.1 mg total) by mouth daily. 05/19/19   Calvert Cantor, MD  levETIRAcetam (KEPPRA) 500 MG tablet Take 1 tablet (500 mg total) by mouth 2 (two) times daily. 05/19/19   Calvert Cantor, MD  NON FORMULARY Diet Type: Regular 03/12/19   [provider]  Nutritional Supplements (ENSURE ENLIVE PO) Take 1 Bottle by mouth daily. Prefers Vanilla 03/17/19   [provider]  polyethylene glycol  (MIRALAX / GLYCOLAX) 17 g packet Take 17 g by mouth daily. 03/13/19   [provider]    Inpatient Medications: Scheduled Meds:  aspirin EC  81 mg Oral Daily   enoxaparin (LOVENOX) injection  40 mg Subcutaneous Q24H   fludrocortisone  0.2 mg Oral Daily   gabapentin  300 mg Oral TID   levETIRAcetam  500 mg Oral BID   pantoprazole  40 mg Oral BID   polyethylene glycol  17 g Oral Daily   QUEtiapine  25 mg Oral QHS   rosuvastatin  5 mg Oral Daily   Continuous Infusions:  PRN Meds: acetaminophen **OR** acetaminophen (TYLENOL) oral liquid 160 mg/5 mL **OR** acetaminophen, senna-docusate  Allergies:    Allergies  Allergen Reactions   Penicillins Hives, Swelling, Rash and Other (See Comments)    Did it involve swelling of the face/tongue/throat, SOB, or low BP? #  #  #  YES  #  #  #  Did it involve sudden or severe rash/hives, skin peeling, or any reaction on the inside of your mouth or nose? No Did you need to seek medical attention at a hospital or doctor's office? #  #  #  YES  #  #  #  When did it last happen?85 years old.    Codeine Phosphate [Codeine] Other (See Comments)    headache    Social History:   Social History   Socioeconomic History   Marital status: Widowed    Spouse name: Not on file   Number of children: Not on file   Years of education: Not on file   Highest education level: Not on file  Occupational History   Occupation: Retired  Ecologistocial Needs   Financial resource strain: Not on file   Food insecurity    Worry: Not on file    Inability: Not on Occupational hygienistfile   Transportation needs    Medical: Not on file    Non-medical: Not on file  Tobacco Use   Smoking status: Never Smoker   Smokeless tobacco: Never Used  Substance and Sexual Activity   Alcohol use: Yes    Comment: rare   Drug use: No   Sexual activity: Not on file  Lifestyle   Physical activity    Days per week: Not on file    Minutes per session: Not on file    Stress: Not on file  Relationships   Social connections    Talks on phone: Not on file    Gets together: Not on file    Attends religious service: Not on file    Active member of club or organization: Not on file    Attends meetings of clubs or organizations: Not on file    Relationship status: Not on file   Intimate partner violence    Fear of current or ex partner: Not on file    Emotionally abused: Not on file    Physically abused: Not on file    Forced sexual activity: Not on file  Other Topics Concern   Not on file  Social History Narrative   Not on file    Family History:   Family History  Problem Relation Age of Onset   Cancer Mother    High blood pressure Father    Emphysema Brother      ROS:  Please see the history of present illness.  All other ROS reviewed and negative.     Physical Exam/Data:   Vitals:   05/20/19 0006 05/20/19 0400 05/20/19 0725 05/20/19 1118  BP: (!) 144/65 (!) 141/69 (!) 143/69 (!) 110/56  Pulse: 72 64 68   Resp: 16 16 17    Temp: 97.9 F (36.6 C) 99.1 F (37.3 C) 98.8 F (37.1 C) 98.2 F (36.8 C)  TempSrc: Oral  Axillary Oral  SpO2: 94% 93% 93%   Weight:      Height:       No intake or output data in the 24 hours ending 05/20/19 1134 Last 3 Weights 05/17/2019 05/17/2019 05/07/2019  Weight (lbs) 141 lb 147 lb (No Data)  Weight (kg) 63.957 kg 66.679 kg (No Data)     Body mass index is 24.98 kg/m.  General:  Well nourished, well developed, in no acute distress HEENT: ecchymosis R cheek/face Lymph: no adenopathy Neck: no JVD Endocrine:  No thryomegaly Vascular: No carotid bruit Cardiac:   RRR; no murmurs, gallops or rubs Lungs:  CTA b/l, no wheezing, rhonchi or rales  Abd: soft, nontender  Ext: no edema Musculoskeletal:  No deformities, age appropriate atrophy, very thin body habitus Skin: warm and dry  Neuro:  no focal abnormalities noted Psych:  Normal affect   EKG:  The EKG was personally reviewed and demonstrates:     05/17/2019 SR, 84bpm, LAD, normal intervals 05/17/2019 SR, 77bpm, no significant changes  02/11/2019 SR 70bpm, , normal intervals  Telemetry:  Telemetry was personally reviewed and demonstrates:    SR 70's, more so today she has been having transient SB 40's, and pauses and 2 - 6.2 seconds this AM.  Some are with sinus slowing prior, not all are  Relevant CV Studies:  05/18/2019: TTE IMPRESSIONS 1. The left ventricle has normal systolic function, with an ejection fraction of 55-60%. The cavity size was normal. There is moderately increased left ventricular wall thickness. Left ventricular diastolic parameters were normal.  2. The right ventricle has normal systolic function. The cavity was normal. There is no increase in right ventricular wall thickness.  3. Moderate thickening of the mitral valve leaflet. Mild calcification of the mitral valve leaflet.  4. Tricuspid valve regurgitation is mild-moderate.  5. The aortic valve is tricuspid. Moderate thickening of the aortic valve. Sclerosis without any evidence of stenosis of the aortic valve. Aortic valve regurgitation is mild by color flow Doppler.  6. The aorta is normal in size and structure.  FINDINGS  Left Ventricle: The left ventricle has normal systolic function, with an ejection fraction of 55-60%. The cavity size was normal. There is moderately increased left ventricular wall thickness. Left ventricular diastolic parameters were normal.  Right Ventricle: The right ventricle has normal systolic function. The cavity was normal. There is no increase in right ventricular wall thickness.  Left Atrium: Left atrial size was normal in size.  Right Atrium: Right atrial size was normal in size. Right atrial pressure is estimated at 10 mmHg.  Interatrial Septum: No atrial level shunt detected by color flow Doppler.  Pericardium: There is no evidence of pericardial effusion.  Mitral Valve: The mitral valve is normal in structure.  Moderate thickening of the mitral valve leaflet. Mild calcification of the mitral valve leaflet. Mitral valve regurgitation is not visualized by color flow Doppler.  Tricuspid Valve: The tricuspid valve is normal in structure. Tricuspid valve regurgitation is mild-moderate by color flow Doppler.  Aortic Valve: The aortic valve is tricuspid Moderate thickening of the aortic valve. Sclerosis without any evidence of stenosis of the aortic valve. Aortic valve regurgitation is mild by color flow Doppler. There is no evidence of aortic valve stenosis.  Pulmonic Valve: The pulmonic valve was grossly normal. Pulmonic valve regurgitation is mild by color flow Doppler.  Aorta: The aorta is normal in size and structure.      Laboratory Data:  High Sensitivity Troponin:  No results for input(s): TROPONINIHS in the last 720 hours.   Cardiac EnzymesNo results for input(s): TROPONINI in the last 168 hours. No results for input(s): TROPIPOC in the last 168 hours.  Chemistry Recent Labs  Lab 05/17/19 1533 05/18/19 0906 05/19/19 0619  NA 141 140 143  K 3.9 3.6 3.8  CL 108 105 109  CO2 23 24 25   GLUCOSE 91 81 97  BUN 11 12 16   CREATININE 0.55 0.64 0.65  CALCIUM 9.2 9.2 8.9  GFRNONAA >60 >60 >60  GFRAA >60 >60 >60  ANIONGAP 10 11 9     Recent Labs  Lab 05/17/19 1533  PROT 7.3  ALBUMIN 4.0  AST 20  ALT 10  ALKPHOS 91  BILITOT 0.9   Hematology Recent Labs  Lab 05/17/19 1302 05/17/19 1531 05/18/19 0906  WBC 4.6  --  3.7*  RBC 4.13  --  4.06  HGB 11.9* 12.2 11.9*  HCT 38.0 36.0 37.2  MCV 92.0  --  91.6  MCH 28.8  --  29.3  MCHC 31.3  --  32.0  RDW 14.1  --  13.9  PLT 190  --  189   BNPNo results for input(s): BNP, PROBNP in the last 168 hours.  DDimer No results for input(s): DDIMER in the last 168 hours.   Radiology/Studies:   Dg Ribs Unilateral W/chest Left Result Date: 05/17/2019 CLINICAL DATA:  Pain status post fall EXAM: LEFT RIBS AND CHEST - 3+ VIEW COMPARISON:   For Feb 16, 2019 FINDINGS: The heart size is mildly enlarged. Aortic calcifications are noted. There is some mild but stable height loss involving the midthoracic spine. There is no pneumothorax. There is a subacute appearing mildly displaced fracture involving the eighth rib laterally on the left. There is no additional acute displaced fracture or dislocation. IMPRESSION: 1. Subacute mildly displaced fracture involving the eighth rib laterally on the left. 2. No pneumothorax. 3. No definite acute cardiopulmonary process. Electronically Signed   By: Katherine Mantle M.D.   On: 05/17/2019 19:29     Ct Head Wo Contrast Result Date: 05/17/2019 CLINICAL DATA:  Difficulty speaking since yesterday patient fell 2 weeks ago. Right facial bruising. EXAM: CT HEAD WITHOUT CONTRAST CT CERVICAL SPINE WITHOUT CONTRAST TECHNIQUE: Multidetector CT imaging of the head and cervical spine was performed following the standard protocol without intravenous contrast. Multiplanar CT image reconstructions of the cervical spine were also generated. COMPARISON:  Cervical spine CT 02/10/2019. FINDINGS: CT HEAD FINDINGS Brain: There is no evidence of acute intracranial hemorrhage, mass lesion, brain edema or extra-axial fluid collection. There is mild atrophy with prominence of the ventricles and subarachnoid spaces. There is patchy low-density in the periventricular white matter which is most likely due to chronic small vessel ischemic changes. There is no CT evidence of acute cortical infarction. Vascular: Intracranial vascular calcifications. No hyperdense vessel identified. Skull: Negative for fracture or focal lesion. There are postsurgical changes related to craniocervical fusion posteriorly. The hardware is intact. Sinuses/Orbits: The visualized paranasal sinuses and mastoid air cells are clear. No orbital abnormalities are seen. Previous scleral banding. Other: None. CT CERVICAL SPINE FINDINGS Alignment: 7 mm anterolisthesis at  C1-2 secondary to a fracture of the base of the dens. Minimal degenerative anterolisthesis at C7-T1. Skull base and vertebrae: No evidence of acute fracture. Possible partial healing of the dens fracture with unchanged anterior displacement as described. Patient is status post posterior fusion from the occiput through C4. A previously demonstrated screw extending into the spinal canal at C2-3 has been removed. The hardware otherwise appears unchanged. There is limited purchase of the C3 and C4 laminar screws, especially on the left. There is some lucency surrounding the left C4 screw. The posterior arch of C1 has been resected is adequate decompression the craniocervical junction. Soft tissues and spinal canal: No prevertebral fluid or swelling. No visible canal hematoma. Disc levels: There is multilevel cervical spondylosis with disc space narrowing and uncinate spurring. Prominent posterior osteophytes remain at C3-4, contributing to residual central stenosis. The left C4-5 facet joint is ankylosed. Upper chest: Mild biapical scarring. Other: None. IMPRESSION: 1. No acute intracranial findings. 2. No evidence of acute cervical spine fracture, traumatic subluxation or static signs  of instability. 3. Stable anterolisthesis at C1-2 secondary to a chronic fracture involving the base of the dens. The craniocervical junction appears adequately decompressed, and there is possible early healing of the fracture. 4. Interval removal of the previously demonstrated C2 laminar screw extending into the spinal canal. The additional hardware is unchanged with limited osseous purchase of the C3 and C4 laminar screws. Electronically Signed   By: Carey BullocksWilliam  Veazey M.D.   On: 05/17/2019 13:51     Mr Angio Head Wo Contrast Result Date: 05/18/2019 CLINICAL DATA:  Speech difficulties.  Fall 2 weeks ago. EXAM: MR HEAD WITHOUT CONTRAST MR CIRCLE OF WILLIS WITHOUT CONTRAST MRA OF THE NECK WITHOUT AND WITH CONTRAST TECHNIQUE: Multiplanar,  multiecho pulse sequences of the brain, circle of willis and surrounding structures were obtained without intravenous contrast. Angiographic images of the neck were obtained using MRA technique without and with intravenous contrast. CONTRAST:  6 mL Gadavist COMPARISON:  Head CT 05/17/2019 FINDINGS: MRI HEAD FINDINGS BRAIN: The midline structures are normal. There is no acute infarct, acute hemorrhage or mass. Early confluent hyperintense T2-weighted signal of the periventricular and deep white matter, most commonly due to chronic ischemic microangiopathy. Generalized atrophy without lobar predilection. Blood-sensitive sequences show no chronic microhemorrhage or superficial siderosis. SKULL AND UPPER CERVICAL SPINE: Posterior craniocervical fusion. SINUSES/ORBITS: No fluid levels or advanced mucosal thickening. No mastoid or middle ear effusion. The orbits are normal. MRA HEAD FINDINGS POSTERIOR CIRCULATION: --Vertebral arteries: Normal V4 segments. --Posterior inferior cerebellar arteries (PICA): Patent origins from the vertebral arteries. --Anterior inferior cerebellar arteries (AICA): Patent origins from the basilar artery. --Basilar artery: Normal. --Superior cerebellar arteries: Normal. --Posterior cerebral arteries (PCA): Normal. The right PCA is predominantly supplied by the posterior communicating artery. ANTERIOR CIRCULATION: --Intracranial internal carotid arteries: Normal. --Anterior cerebral arteries (ACA): Normal. Both A1 segments are present. Patent anterior communicating artery (a-comm). --Middle cerebral arteries (MCA): Normal. MRA NECK FINDINGS Aortic arch: Normal 3 vessel aortic branching pattern. The visualized subclavian arteries are normal. Right carotid system: Normal course and caliber without stenosis or evidence of dissection. Left carotid system: Normal course and caliber without stenosis or evidence of dissection. Vertebral arteries: Left dominant. Vertebral artery origins are normal.  Vertebral arteries are normal in course and caliber to the vertebrobasilar confluence without stenosis or evidence of dissection. IMPRESSION: 1. Chronic ischemic microangiopathy and generalized volume loss without acute intracranial abnormality. 2. Normal cervical and intracranial MRA. Electronically Signed   By: Deatra RobinsonKevin  Herman M.D.   On: 05/18/2019 04:43     Mr Angio Neck W Wo Contrast Result Date: 05/18/2019 CLINICAL DATA:  Speech difficulties.  Fall 2 weeks ago. EXAM: MR HEAD WITHOUT CONTRAST MR CIRCLE OF WILLIS WITHOUT CONTRAST MRA OF THE NECK WITHOUT AND WITH CONTRAST TECHNIQUE: Multiplanar, multiecho pulse sequences of the brain, circle of willis and surrounding structures were obtained without intravenous contrast. Angiographic images of the neck were obtained using MRA technique without and with intravenous contrast. CONTRAST:  6 mL Gadavist COMPARISON:  Head CT 05/17/2019 FINDINGS: MRI HEAD FINDINGS BRAIN: The midline structures are normal. There is no acute infarct, acute hemorrhage or mass. Early confluent hyperintense T2-weighted signal of the periventricular and deep white matter, most commonly due to chronic ischemic microangiopathy. Generalized atrophy without lobar predilection. Blood-sensitive sequences show no chronic microhemorrhage or superficial siderosis. SKULL AND UPPER CERVICAL SPINE: Posterior craniocervical fusion. SINUSES/ORBITS: No fluid levels or advanced mucosal thickening. No mastoid or middle ear effusion. The orbits are normal. MRA HEAD FINDINGS POSTERIOR CIRCULATION: --Vertebral arteries: Normal  V4 segments. --Posterior inferior cerebellar arteries (PICA): Patent origins from the vertebral arteries. --Anterior inferior cerebellar arteries (AICA): Patent origins from the basilar artery. --Basilar artery: Normal. --Superior cerebellar arteries: Normal. --Posterior cerebral arteries (PCA): Normal. The right PCA is predominantly supplied by the posterior communicating artery.  ANTERIOR CIRCULATION: --Intracranial internal carotid arteries: Normal. --Anterior cerebral arteries (ACA): Normal. Both A1 segments are present. Patent anterior communicating artery (a-comm). --Middle cerebral arteries (MCA): Normal. MRA NECK FINDINGS Aortic arch: Normal 3 vessel aortic branching pattern. The visualized subclavian arteries are normal. Right carotid system: Normal course and caliber without stenosis or evidence of dissection. Left carotid system: Normal course and caliber without stenosis or evidence of dissection. Vertebral arteries: Left dominant. Vertebral artery origins are normal. Vertebral arteries are normal in course and caliber to the vertebrobasilar confluence without stenosis or evidence of dissection. IMPRESSION: 1. Chronic ischemic microangiopathy and generalized volume loss without acute intracranial abnormality. 2. Normal cervical and intracranial MRA. Electronically Signed   By: Ulyses Jarred M.D.   On: 05/18/2019 04:43     Assessment and Plan:   1. recurrent syncope  She is reported to have a history of orthostatic intolerance, initially did well with Florinef though stopped 2/2 edema Resumed yesterday with worsened orthostatic vitals (became syncopal yesterday with hypotension though only noted short pause of <3 seconds yesterday on tele She continues to be orthostatic by notes, apart from pausing  Also noted now to have sinus pauses.   Today symptomatic with brief LOC while in bed, supine   Last dose of Toprol XL 12.5mg  was yesterday at 1000, 1/2 life in d/w pharmacist 7hours (washout would be 35 hours) She is not completely washed out yet  I have discussed the case with Dr. Rayann Heman Need to allow full washout of her BB.    ? If she has developed significant autonomic dysfunction after her neck injury/surgery  I will put her on tomorrow's schedule tentatively  If she continues to have more symptomatic pauses may need transfer to ICU, +/- temp wire Keep on  bedrest No PT/OT today, hold off further orthostatic checks for now She has zoll pads on if needed        For questions or updates, please contact [facility] Please consult [email or web address] for contact info under     Signed, Baldwin [place], PA-C  05/20/2019 11:34 AM   I have seen, examined the patient, and reviewed the above assessment and plan.  Changes to above are made where necessary.  On exam, RRR.  Elderly and frail.  Pt with sinus node dysfunction. Stop metoprolol and follow She wishes to avoid pacing. EP to reassess in AM.  Co Sign: Thompson Grayer, MD

## 2019-05-21 ENCOUNTER — Encounter (HOSPITAL_COMMUNITY)
Admission: EM | Disposition: A | Discharge status: Home Health | Payer: Self-pay | Source: Home / Self Care | Attending: Internal Medicine

## 2019-05-21 SURGERY — PACEMAKER IMPLANT

## 2019-05-21 NOTE — Progress Notes (Signed)
Occupational Therapy Treatment Patient Details Name: Denise Espinoza MRN: [id number] DOB: Mar 30, 1930 Today's Date: 05/21/2019    History of present illness This 85 y.o. female admitted with transient spells of impaired speech, confusion, and lethargy.  MRI of brain was negative for acute infarct.  Current diagnosis; encephalopathy with confusion/and lethargy consistent with post ictal state after transient neurological spell.  Work up underway.  PMH: SVT, cervical cord injury s/p cervical fusion.    OT comments  Pt with better vitals today: BP in sitting: 129/73; standing 101/61 and after 3 mins of exercise: 126/75. Pt performing grooming at sink in standing and sitting with minimal dizziness noted. Pt performing ADL functional transfers with minguardA to minA and minguardA for mobility with RW. Pt set-upA for grooming. Pt reporting fatigue with exertion. Pt tolerating standing x3 mins with one UE supported throughout tasks. Pt would greatly benefit from continued OT skilled services for ADL, mobility and safety in [facility] setting. OT following acutely.  HR 70-90 BPM with exertion.     Follow Up Recommendations  Home health OT;Supervision/Assistance - 24 hour    Equipment Recommendations  None recommended by OT    Recommendations for Other Services      Precautions / Restrictions Precautions Precautions: Fall Restrictions Weight Bearing Restrictions: No       Mobility Bed Mobility Overal bed mobility: Needs Assistance Bed Mobility: Supine to Sit     Supine to sit: Min guard        Transfers Overall transfer level: Needs assistance Equipment used: Rolling walker (2 wheeled);2 person hand held assist Transfers: Sit to/from Stand Sit to Stand: Min guard;Min assist Stand pivot transfers: Min guard       General transfer comment: minguardA  to minA for power up    Balance Overall balance assessment: Needs assistance   Sitting balance-Leahy Scale: Fair Sitting balance -  Comments: able to maintain static sitting with supervision    Standing balance support: Bilateral upper extremity supported Standing balance-Leahy Scale: Poor Standing balance comment: requires UE support                            ADL either performed or assessed with clinical judgement   ADL Overall ADL's : Needs assistance/impaired Eating/Feeding: Modified independent;Sitting   Grooming: Wash/dry hands;Wash/dry face;Oral care;Brushing hair;Min guard;Standing   Upper Body Bathing: Minimal assistance;Standing   Lower Body Bathing: Moderate assistance;Sitting/lateral leans;Sit to/from stand   Upper Body Dressing : Minimal assistance;Standing   Lower Body Dressing: Moderate assistance;Sitting/lateral leans;Sit to/from stand;Cueing for safety;Cueing for sequencing   Toilet Transfer: Min guard;Cueing for safety;Stand-pivot;BSC   Toileting- Clothing Manipulation and Hygiene: Moderate assistance;Cueing for safety;Cueing for sequencing;Sitting/lateral lean;Sit to/from stand       Functional mobility during ADLs: Min guard;Rolling walker;Cueing for safety General ADL Comments: Pt with dizziness in standing, but pt able perform functional tasks as she described it as "very little dizziness."      Vision   Vision Assessment?: No apparent visual deficits   Perception     Praxis      Cognition Arousal/Alertness: Awake/alert Behavior During Therapy: WFL for tasks assessed/performed Overall Cognitive Status: Impaired/Different from baseline Area of Impairment: Problem solving;Memory                   Current Attention Level: Selective   Following Commands: Follows one step commands consistently;Follows multi-step commands inconsistently     Problem Solving: Slow processing;Requires verbal cues General Comments: Pt reporting that  she is having trouble with dizziness when she first gets up and then feels better. BP in general comments and given to RN.         Exercises     Shoulder Instructions       General Comments BP in sitting: 129/73; standing 101/61 and after 3 mins of exercise: 126/75.    Pertinent Vitals/ Pain       Pain Assessment: Faces Faces Pain Scale: Hurts a little bit Pain Descriptors / Indicators: Discomfort Pain Intervention(s): Limited activity within patient's tolerance  Home Living                                          Prior Functioning/Environment              Frequency  Min 2X/week        Progress Toward Goals  OT Goals(current goals can now be found in the care plan section)  Progress towards OT goals: Progressing toward goals  Acute Rehab OT Goals Patient Stated Goal: to return home OT Goal Formulation: With patient Time For Goal Achievement: 06/01/19 Potential to Achieve Goals: Good ADL Goals Pt Will Perform Grooming: with min guard assist;standing Pt Will Perform Upper Body Bathing: with set-up;sitting Pt Will Perform Lower Body Bathing: with min guard assist;with adaptive equipment;sit to/from stand Pt Will Perform Upper Body Dressing: with supervision;sitting Pt Will Perform Lower Body Dressing: with min guard assist;with adaptive equipment;sit to/from stand Pt Will Transfer to Toilet: with min guard assist;ambulating;grab bars;regular height toilet;bedside commode Pt Will Perform Toileting - Clothing Manipulation and hygiene: with min guard assist;sit to/from stand  Plan Discharge plan remains appropriate    Co-evaluation                 AM-PAC OT "6 Clicks" Daily Activity     Outcome Measure   Help from another person eating meals?: None Help from another person taking care of personal grooming?: A Little Help from another person toileting, which includes using toliet, bedpan, or urinal?: A Little Help from another person bathing (including washing, rinsing, drying)?: A Lot Help from another person to put on and taking off regular upper body clothing?:  A Little Help from another person to put on and taking off regular lower body clothing?: A Lot 6 Click Score: 17    End of Session Equipment Utilized During Treatment: Gait belt  OT Visit Diagnosis: Unsteadiness on feet (R26.81);Cognitive communication deficit (R41.841) Symptoms and signs involving cognitive functions: Cerebral infarction   Activity Tolerance Treatment limited secondary to medical complications (Comment)   Patient Left in chair;with call bell/phone within reach;with chair alarm set   Nurse Communication Mobility status        Time: 4403-4742 OT Time Calculation (min): 42 min  Charges: OT General Charges $OT Visit: 1 Visit OT Treatments $Self Care/Home Management : 23-37 mins $Neuromuscular Re-education: 8-22 mins  Darryl Nestle) Marsa Aris OTR/L Acute Rehabilitation Services Pager: [phone] Office: [phone]    Jenene Slicker Anaysha Andre 05/21/2019, 1:47 PM

## 2019-05-21 NOTE — Care Management Important Message (Signed)
Important Message  Patient Details  Name: Denise Espinoza MRN: [id number] Date of Birth: 1930-02-26   Medicare Important Message Given:  Yes     Sabrena Gavitt 05/21/2019, 2:04 PM

## 2019-05-21 NOTE — Progress Notes (Signed)
OT Cancellation Note  Patient Details Name: Denise Espinoza MRN: [id number] DOB: 1930-07-29   Cancelled Treatment:    Reason Eval/Treat Not Completed: Other (comment)(Pt awaiting pacemaker placement. OT to continue to follow)   Ebony Hail Harold Hedge) Marsa Aris OTR/L Acute Rehabilitation Services Pager: [phone] Office: [place] 05/21/2019, 7:12 AM

## 2019-05-21 NOTE — Progress Notes (Signed)
PROGRESS NOTE    Denise Espinoza  WCB:[id number] DOB: Sep 29, 1930 DOA: 05/17/2019 PCP: Tacy Learn, FNP   Brief Narrative:  Patient is 85 year old female with history of cervical spine stenosis with myelopathy, central cord syndrome, SVT was brought to the emergency department with complaints of word finding difficulty, dysarthria.  Patient was evaluated by neurology after admission.  MRI did not show any stroke.  Suspected to have seizures and started on Keppra.  Hospital course remarkable for significant bradycardia/pauses causing syncope.  EP was consulted.  Plan is to discontinue beta-blocker. No plan for intervention. Discharge planning tomorrow.  Assessment & Plan:   Principal Problem:   Word finding difficulty Active Problems:   Stenosis of cervical spine with myelopathy (HCC)   Central cord syndrome (HCC)   Orthostatic hypotension   History of supraventricular tachycardia   H/O Cervical  laminectomy   Supraventricular tachycardia (HCC)   GERD without esophagitis   Chronic constipation   Syncope   Seizures (HCC)   Slurred speech   Dysarthria: Presented with word finding difficulty, inability to finish sentences.  Neurology was following.  Head and cervical CT were unremarkable.  MRI was negative for acute ischemic stroke and shows chronic ischemic microangiopathy.  MRA is unremarkable.  2D echo did not show any significant abnormalities.  EEG did not reveal any epileptiform discharges.  Neurology recommended to start on Keppra 500 mg twice a day.  Trazodone discontinued.  Recommended to follow-up with neurology in 3 to 4 weeks as an outpatient.  Bradycardia/syncope/pause: Had 6.28-second pause on 05/20/19 with syncope.  EP consulted.  Plan is to discontinue beta-blocker and any other nodal blocking agents.  No plan for pacemaker placement.  Currently her heart rate is controlled.Monitored on tele.  Fall: Reported falling at home.  PT/OT consulted and recommended home health  physical therapy/Occupational Therapy.  Dizziness/orthostatic hypotension: Has history of orthostatic hypotension.  Currently on Florinef.  Metoprolol discontinued.  Increase the dose of Florinef to 0.2 mg daily.  Plan is to keep her blood pressure slightly elevated to prevent orthostatic hypotension.  Rib fracture: Denies any pain.  Secondary to fall.  Chest x-ray revealed subacute mildly displaced fracture involving the eighth rib on the left.  Continue supportive care  History of cervical spine stenosis with myelopathy/central cord syndrome status post surgery Displaced type II odontoid fracture requiring C1 laminectomy, C2 ORIF and occipital to C4 PLIF on 02/06/2019. Patient subsequently required removal of C2 translaminar screw on 02/11/2019 per Dr. Conchita Paris  Hyperlipidemia: Continue Crestor  GERD: Continue PPI          DVT prophylaxis: Lovenox Code Status: partial Family Communication: None Disposition Plan: Home tomorrow   Consultants: Cardiology, neurology  Procedures: EEG, echocardiogram, MRI  Antimicrobials:  Anti-infectives (From admission, onward)   Start     Dose/Rate Route Frequency Ordered Stop   05/17/19 1845  ciprofloxacin (CIPRO) tablet 500 mg  Status:  Discontinued     500 mg Oral  Once 05/17/19 1844 05/17/19 1845   05/17/19 1845  metroNIDAZOLE (FLAGYL) tablet 500 mg  Status:  Discontinued     500 mg Oral  Once 05/17/19 1844 05/17/19 1845      Subjective:  Patient seen and examined the bedside this morning.  Currently hemodynamically stable.  Heart rate well controlled.  Denies any lightheadedness or dizziness.  Denies any complaints.  Comfortable  Objective: Vitals:   05/20/19 1621 05/20/19 1925 05/20/19 2318 05/21/19 0323  BP: 112/62 133/66 119/64 139/62  Pulse: 71 64 71 70  Resp: 16 16 16 16   Temp: 98.4 F (36.9 C) 98.3 F (36.8 C) 98 F (36.7 C) 98.3 F (36.8 C)  TempSrc: Oral Oral Oral Oral  SpO2: 94% 96% 93% 93%  Weight:      Height:         Intake/Output Summary (Last 24 hours) at 05/21/2019 [id number] Last data filed at 05/21/2019 [id number] Gross per 24 hour  Intake -  Output 1030 ml  Net -1030 ml   Filed Weights   05/17/19 1303 05/17/19 2250  Weight: 66.7 kg 64 kg    Examination:  General exam: Appears calm and comfortable ,Not in distress,average built, pleasant elderly female HEENT:PERRL,Oral mucosa moist, Ear/Nose normal on gross exam, bruise on the right face Respiratory system: Bilateral equal air entry, normal vesicular breath sounds, no wheezes or crackles  Cardiovascular system: S1 & S2 heard, RRR. No JVD, murmurs, rubs, gallops or clicks. No pedal edema. Gastrointestinal system: Abdomen is nondistended, soft and nontender. No organomegaly or masses felt. Normal bowel sounds heard. Central nervous system: Alert and oriented. No focal neurological deficits. Extremities: No edema, no clubbing ,no cyanosis, distal peripheral pulses palpable. Skin: No rashes, lesions or ulcers,no icterus ,no pallor MSK: Normal muscle bulk,tone ,power Psychiatry: Judgement and insight appear normal. Mood & affect appropriate.     Data Reviewed: I have personally reviewed following labs and imaging studies  CBC: Recent Labs  Lab 05/17/19 1302 05/17/19 1531 05/18/19 0906  WBC 4.6  --  3.7*  NEUTROABS 3.2  --  2.2  HGB 11.9* 12.2 11.9*  HCT 38.0 36.0 37.2  MCV 92.0  --  91.6  PLT 190  --  189   Basic Metabolic Panel: Recent Labs  Lab 05/17/19 1531 05/17/19 1533 05/18/19 0906 05/19/19 0619  NA 141 141 140 143  K 4.5 3.9 3.6 3.8  CL 106 108 105 109  CO2  --  23 24 25   GLUCOSE 85 91 81 97  BUN 11 11 12 16   CREATININE 0.50 0.55 0.64 0.65  CALCIUM  --  9.2 9.2 8.9   GFR: Estimated Creatinine Clearance: 44.6 mL/min (by C-G formula based on SCr of 0.65 mg/dL). Liver Function Tests: Recent Labs  Lab 05/17/19 1533  AST 20  ALT 10  ALKPHOS 91  BILITOT 0.9  PROT 7.3  ALBUMIN 4.0   No results for input(s): LIPASE,  AMYLASE in the last 168 hours. No results for input(s): AMMONIA in the last 168 hours. Coagulation Profile: Recent Labs  Lab 05/17/19 1538  INR 1.1   Cardiac Enzymes: No results for input(s): CKTOTAL, CKMB, CKMBINDEX, TROPONINI in the last 168 hours. BNP (last 3 results) No results for input(s): PROBNP in the last 8760 hours. HbA1C: No results for input(s): HGBA1C in the last 72 hours. CBG: Recent Labs  Lab 05/17/19 1501  GLUCAP 86   Lipid Profile: No results for input(s): CHOL, HDL, LDLCALC, TRIG, CHOLHDL, LDLDIRECT in the last 72 hours. Thyroid Function Tests: No results for input(s): TSH, T4TOTAL, FREET4, T3FREE, THYROIDAB in the last 72 hours. Anemia Panel: No results for input(s): VITAMINB12, FOLATE, FERRITIN, TIBC, IRON, RETICCTPCT in the last 72 hours. Sepsis Labs: No results for input(s): PROCALCITON, LATICACIDVEN in the last 168 hours.  Recent Results (from the past 240 hour(s))  SARS Coronavirus 2 [facility](Hospital order, [facility]) Nasopharyngeal Nasopharyngeal Swab     Status: None   Collection Time: 05/17/19  6:18 PM   Specimen: Nasopharyngeal Swab  Result Value Ref Range Status  SARS Coronavirus 2 NEGATIVE NEGATIVE Final    Comment: (NOTE) If result is NEGATIVE SARS-CoV-2 target nucleic acids are NOT DETECTED. The SARS-CoV-2 RNA is generally detectable in upper and lower  respiratory specimens during the acute phase of infection. The lowest  concentration of SARS-CoV-2 viral copies this assay can detect is 250  copies / mL. A negative result does not preclude SARS-CoV-2 infection  and should not be used as the sole basis for treatment or other  patient management decisions.  A negative result may occur with  improper specimen collection / handling, submission of specimen other  than nasopharyngeal swab, presence of viral mutation(s) within the  areas targeted by this assay, and inadequate number of viral copies  (<250 copies / mL). A  negative result must be combined with clinical  observations, patient history, and epidemiological information. If result is POSITIVE SARS-CoV-2 target nucleic acids are DETECTED. The SARS-CoV-2 RNA is generally detectable in upper and lower  respiratory specimens dur ing the acute phase of infection.  Positive  results are indicative of active infection with SARS-CoV-2.  Clinical  correlation with patient history and other diagnostic information is  necessary to determine patient infection status.  Positive results do  not rule out bacterial infection or co-infection with other viruses. If result is PRESUMPTIVE POSTIVE SARS-CoV-2 nucleic acids MAY BE PRESENT.   A presumptive positive result was obtained on the submitted specimen  and confirmed on repeat testing.  While 2019 novel coronavirus  (SARS-CoV-2) nucleic acids may be present in the submitted sample  additional confirmatory testing may be necessary for epidemiological  and / or clinical management purposes  to differentiate between  SARS-CoV-2 and other Sarbecovirus currently known to infect humans.  If clinically indicated additional testing with an alternate test  methodology [phone]) is advised. The SARS-CoV-2 RNA is generally  detectable in upper and lower respiratory sp ecimens during the acute  phase of infection. The expected result is Negative. Fact Sheet for Patients:  StrictlyIdeas.no Fact Sheet for Healthcare Providers: BankingDealers.co.za This test is not yet approved or cleared by the [place] FDA and has been authorized for detection and/or diagnosis of SARS-CoV-2 by FDA under an Emergency Use Authorization (EUA).  This EUA will remain in effect (meaning this test can be used) for the duration of the COVID-19 declaration under Section 564(b)(1) of the Act, 21 U.S.C. section 360bbb-3(b)(1), unless the authorization is terminated or revoked sooner. Performed  at [facility], [address] [place], [place] [address]          Radiology Studies: No results found.      Scheduled Meds: . aspirin EC  81 mg Oral Daily  . enoxaparin (LOVENOX) injection  40 mg Subcutaneous Q24H  . fludrocortisone  0.2 mg Oral Daily  . gabapentin  300 mg Oral TID  . levETIRAcetam  500 mg Oral BID  . pantoprazole  40 mg Oral BID  . polyethylene glycol  17 g Oral Daily  . QUEtiapine  25 mg Oral QHS  . rosuvastatin  5 mg Oral Daily   Continuous Infusions:   LOS: 3 days    Time spent: 25 mins.More than 50% of that time was spent in counseling and/or coordination of care.      Shelly Coss, MD [facility] Pager [phone]  If 7PM-7AM, please contact night-coverage [email or web address] Password [facility] 05/21/2019, 9:36 AM

## 2019-05-21 NOTE — Progress Notes (Addendum)
Progress Note  Patient Name: Denise Espinoza Date of Encounter: 05/21/2019  Primary Cardiologist: Laural Golden, MD   Subjective   Feels well this AM, just woken  Bu tfeel like in general more energy.  No recurrent fainting yesterday after our visit, no CP, no SOB  Inpatient Medications    Scheduled Meds: . aspirin EC  81 mg Oral Daily  . enoxaparin (LOVENOX) injection  40 mg Subcutaneous Q24H  . fludrocortisone  0.2 mg Oral Daily  . gabapentin  300 mg Oral TID  . levETIRAcetam  500 mg Oral BID  . pantoprazole  40 mg Oral BID  . polyethylene glycol  17 g Oral Daily  . QUEtiapine  25 mg Oral QHS  . rosuvastatin  5 mg Oral Daily   Continuous Infusions:  PRN Meds: acetaminophen **OR** acetaminophen (TYLENOL) oral liquid 160 mg/5 mL **OR** acetaminophen, senna-docusate   Vital Signs    Vitals:   05/20/19 1621 05/20/19 1925 05/20/19 2318 05/21/19 0323  BP: 112/62 133/66 119/64 139/62  Pulse: 71 64 71 70  Resp: 16 16 16 16   Temp: 98.4 F (36.9 C) 98.3 F (36.8 C) 98 F (36.7 C) 98.3 F (36.8 C)  TempSrc: Oral Oral Oral Oral  SpO2: 94% 96% 93% 93%  Weight:      Height:        Intake/Output Summary (Last 24 hours) at 05/21/2019 0820 Last data filed at 05/21/2019 0618 Gross per 24 hour  Intake -  Output 1030 ml  Net -1030 ml   Last 3 Weights 05/17/2019 05/17/2019 05/07/2019  Weight (lbs) 141 lb 147 lb (No Data)  Weight (kg) 63.957 kg 66.679 kg (No Data)      Telemetry    SR, 70's, occasional blocks PACs, some infrequent Mobiz I, and very short pauses <3 seconds - Personally Reviewed with Dr. 05/09/2019  ECG    No new EKGs - Personally Reviewed  Physical Exam   GEN: No acute distress.  (facial ecchymosis R) Neck: No JVD Cardiac: RRR, no murmurs, rubs, or gallops.  Respiratory: CTA b/l. GI: Soft, nontender, non-distended  MS: No edema; No deformity. Neuro:  Nonfocal  Psych: Normal affect   Labs    High Sensitivity Troponin:  No results for input(s):  TROPONINIHS in the last 720 hours.    Cardiac EnzymesNo results for input(s): TROPONINI in the last 168 hours. No results for input(s): TROPIPOC in the last 168 hours.   Chemistry Recent Labs  Lab 05/17/19 1533 05/18/19 0906 05/19/19 0619  NA 141 140 143  K 3.9 3.6 3.8  CL 108 105 109  CO2 23 24 25   GLUCOSE 91 81 97  BUN 11 12 16   CREATININE 0.55 0.64 0.65  CALCIUM 9.2 9.2 8.9  PROT 7.3  --   --   ALBUMIN 4.0  --   --   AST 20  --   --   ALT 10  --   --   ALKPHOS 91  --   --   BILITOT 0.9  --   --   GFRNONAA >60 >60 >60  GFRAA >60 >60 >60  ANIONGAP 10 11 9      Hematology Recent Labs  Lab 05/17/19 1302 05/17/19 1531 05/18/19 0906  WBC 4.6  --  3.7*  RBC 4.13  --  4.06  HGB 11.9* 12.2 11.9*  HCT 38.0 36.0 37.2  MCV 92.0  --  91.6  MCH 28.8  --  29.3  MCHC 31.3  --  32.0  RDW  14.1  --  13.9  PLT 190  --  189    BNPNo results for input(s): BNP, PROBNP in the last 168 hours.   DDimer No results for input(s): DDIMER in the last 168 hours.   Radiology    No results found.  Cardiac Studies   05/18/2019: TTE IMPRESSIONS 1. The left ventricle has normal systolic function, with an ejection fraction of 55-60%. The cavity size was normal. There is moderately increased left ventricular wall thickness. Left ventricular diastolic parameters were normal. 2. The right ventricle has normal systolic function. The cavity was normal. There is no increase in right ventricular wall thickness. 3. Moderate thickening of the mitral valve leaflet. Mild calcification of the mitral valve leaflet. 4. Tricuspid valve regurgitation is mild-moderate. 5. The aortic valve is tricuspid. Moderate thickening of the aortic valve. Sclerosis without any evidence of stenosis of the aortic valve. Aortic valve regurgitation is mild by color flow Doppler. 6. The aorta is normal in size and structure.  Patient Profile     85 y.o. female C-spine stenosis w/myelopathy, centra clord syndrome, OA,  ?SVT  story follows as below admitted 4/28-02/15/2023 displaced type II dens fracture with ORIF on 4/30. ---She was discharged to [facility] on 5/9 for significant functional deficits. Follow-up CT showed C2 laminar screw intruding into the posterior spinal canal and right lateral recess. It was removed on 5/5 but she continued to have declining worsening of numbness. ---She was admitted to rehab 5/9-03/12/2019 for central cord syndrome. She also had a UTI and anemia. She was noted to have orthostatic hypotension and Florinef was started during this admission. ---After discharge from rehab on 7/11, she was seen by Dr. Tessa Lerner on 7/29. At that time, there was concern about lower extremity edema. This was felt to be dependent edema but it was noted that gabapentin and Florinef could contribute to this so the Florinef was stopped and gabapentin was tapered. She was noted to have a history of SVT and was having problems with presyncope. She was to continue on metoprolol daily. She was having sudden "dropouts", cause unclear,as they would start while she was seated. Cardiology referral made.  8/8, patient was admitted for syncopal episode. Cardiology asked to evaluate.  Her Toprol was stopped yesterday, her last dose yesterday morning 1007  When she was admitted, orthostatic vital signs were only mildly positive. -The Florinef was held, but by 8/10, her orthostatic vital signs were markedly positive. -The Florinef was restarted.  EP called for sinus pauses some as long as 6.2 seconds and symptomatic with LOC, transient bradycardia  Assessment & Plan    1. Recurrent syncope     Clear some (most) is her orthostatic issue, that responded to florinef      She developed sinus bradycardia and sinus pauses, most short and asymptomatic though did have as long as 6.2 seconds with LOC. She is near 48 hours off her Toprol this AM  In review of her telemetry this morning with Dr. Curt Bears, her  telemetry, HR and rhythm has improved. She had short pauses overnight, none > 3 seconds, some evidence of Mobitz I, and some blocked PACS.  No prolonged pauses, no high AVBlock She has been mostly in SR 70's  Dr. Curt Bears has seen and examined the patient, for now, no PPM is planned DO NOT resume her toprol or use any potential nodal blocking drugs for this patient  Given her orthostatic issue, would consider to allow a bit higher baseline BP for her ?  If secondary to her C-spine, spinal cord trauma  Deferred to IM team    EP service  sign off though remain available Please recall if needed       For questions or updates, please contact [facility] Please consult [email or web address] for contact info under        Signed, Sheilah Pigeon, PA-C  05/21/2019, 8:20 AM    I have seen and examined this patient with [name] Dowse.  Agree with above, note added to reflect my findings.  On exam, RRR, no murmurs, lungs clear.  After stopping metoprolol, patient had no further episodes of sinus arrest.  At this point, pacemaker implantation is not indicated.  Agree with further mobilization and PT.  EP to sign off today.   M.  MD 05/21/2019 2:33 PM

## 2019-05-22 LAB — TSH: TSH: 0.659 u[IU]/mL (ref 0.350–4.500)

## 2019-05-22 LAB — SURGICAL PCR SCREEN
MRSA, PCR: NEGATIVE
Staphylococcus aureus: NEGATIVE

## 2019-05-22 MED ORDER — CHLORHEXIDINE GLUCONATE 4 % EX LIQD
60.0000 mL | Freq: Once | CUTANEOUS | Status: AC
  Administered 2019-05-23: 4 via TOPICAL
  Filled 2019-05-22: qty 60

## 2019-05-22 MED ORDER — VANCOMYCIN HCL IN DEXTROSE 1-5 GM/200ML-% IV SOLN
1000.0000 mg | INTRAVENOUS | Status: AC
  Administered 2019-05-23: 1000 mg via INTRAVENOUS
  Filled 2019-05-22: qty 200

## 2019-05-22 MED ORDER — SODIUM CHLORIDE 0.9 % IV SOLN
INTRAVENOUS | Status: DC
  Administered 2019-05-23: via INTRAVENOUS

## 2019-05-22 MED ORDER — FLUDROCORTISONE ACETATE 0.1 MG PO TABS: 0.2000 mg | ORAL_TABLET | Freq: Every day | ORAL | 0 refills | Status: DC

## 2019-05-22 MED ORDER — SODIUM CHLORIDE 0.9 % IV SOLN
250.0000 mL | INTRAVENOUS | Status: DC
  Administered 2019-05-23: 250 mL via INTRAVENOUS

## 2019-05-22 MED ORDER — SODIUM CHLORIDE 0.9% FLUSH
3.0000 mL | Freq: Two times a day (BID) | INTRAVENOUS | Status: DC
  Administered 2019-05-22 – 2019-05-24 (×4): 3 mL via INTRAVENOUS

## 2019-05-22 MED ORDER — SODIUM CHLORIDE 0.9% FLUSH: 3.0000 mL | INTRAVENOUS | Status: DC | PRN

## 2019-05-22 MED ORDER — SODIUM CHLORIDE 0.9 % IV SOLN
80.0000 mg | INTRAVENOUS | Status: AC
  Administered 2019-05-23: 80 mg
  Filled 2019-05-22: qty 2

## 2019-05-22 MED ORDER — CHLORHEXIDINE GLUCONATE 4 % EX LIQD
60.0000 mL | Freq: Once | CUTANEOUS | Status: AC
  Administered 2019-05-23: 4 via TOPICAL
  Filled 2019-05-22 (×2): qty 60

## 2019-05-22 NOTE — Discharge Summary (Addendum)
Physician Discharge Summary  Denise Espinoza ZOX:[id number] DOB: July 12, 1930 DOA: 05/17/2019  PCP: Tacy Learn, FNP  Admit date: 05/17/2019 Discharge date: 05/24/19 Admitted From: Home Disposition:  Home  Discharge Condition:Stable CODE STATUS:Partial Diet recommendation: Heart Healthy   Brief/Interim Summary:  Patient is 85 year old female with history of cervical spine stenosis with myelopathy, central cord syndrome, SVT was brought to the emergency department with complaints of word finding difficulty, dysarthria.  Patient was evaluated by neurology after admission.  MRI did not show any stroke.  Suspected to have seizures and started on Keppra.  Hospital course remarkable for multiple episodes of significant bradycardia/pauses causing syncope.  EP was consulted.  Discontinued beta-blocker. Underwent  pacemaker placement on 05/25/19.  Following problems were addressed during her hospitalization:  Bradycardia/syncope/pause:Had multiple pauses as captured by tele. Beta-blocker discontinued .EP following and underwent pacemaker placement .  Dysarthria:Presented with word finding difficulty, inability to finish sentences. Neurology was following. Head and cervical CT were unremarkable. MRI was negative for acute ischemic stroke and shows chronic ischemic microangiopathy. MRAis unremarkable. 2D echo did not show any significant abnormalities. EEG did not reveal any epileptiform discharges. Neurology recommended to start on Keppra 500 mg twice a day. Trazodone discontinued. Recommended to follow-up with neurology in 3 to 4 weeks as an outpatient.  Fall: Reported falling at home. PT/OT consulted and recommended home health physical therapy/Occupational Therapy.  Dizziness/orthostatic hypotension: Has history of orthostatic hypotension. Currently on Florinef. Metoprolol discontinued. Increase the dose of Florinef to 0.2 mg daily. Plan is to keep her blood pressure slightly  elevated to prevent orthostatic hypotension.  Ribfracture: Denies any pain. Secondary to fall. Chest x-ray revealed subacute mildly displaced fracture involving the eighth rib on the left. Continue supportive care  History of cervical spine stenosis with myelopathy/central cord syndrome status post surgery Displaced type II odontoid fracture requiring C1 laminectomy, C2 ORIF and occipital to C4 PLIF on 02/06/2019. Patient subsequently required removal of C2 translaminar screw on 02/11/2019 per Dr. Conchita Paris  Hyperlipidemia: Continue Crestor  GERD: Continue PPI  Discharge Diagnoses:  Principal Problem:   Word finding difficulty Active Problems:   Stenosis of cervical spine with myelopathy (HCC)   Central cord syndrome (HCC)   Orthostatic hypotension   History of supraventricular tachycardia   H/O Cervical  laminectomy   Supraventricular tachycardia (HCC)   GERD without esophagitis   Chronic constipation   Syncope   Seizures (HCC)   Slurred speech    Discharge Instructions  Discharge Instructions    Ambulatory referral to Neurology   Complete by: As directed    An appointment is requested in approximately: 4 weeks   Increase activity slowly   Complete by: As directed      Allergies as of 05/22/2019      Reactions   Penicillins Hives, Swelling, Rash, Other (See Comments)   Did it involve swelling of the face/tongue/throat, SOB, or low BP? #  #  #  YES  #  #  #  Did it involve sudden or severe rash/hives, skin peeling, or any reaction on the inside of your mouth or nose? No Did you need to seek medical attention at a hospital or doctor's office? #  #  #  YES  #  #  #  When did it last happen?85 years old.   Codeine Phosphate [codeine] Other (See Comments)   headache      Medication List    STOP taking these medications   metoprolol succinate 25 MG 24 hr tablet Commonly  known as: TOPROL-XL   traZODone 50 MG tablet Commonly known as: DESYREL     TAKE  these medications   acetaminophen 325 MG tablet Commonly known as: TYLENOL Take 1-2 tablets (325-650 mg total) by mouth every 4 (four) hours as needed for mild pain.   Artificial Tears 0.1-0.3 % Soln Generic drug: Dextran 70-Hypromellose Place 1 drop into both eyes 3 (three) times daily as needed.   calcium-vitamin D 500-200 MG-UNIT tablet Commonly known as: OSCAL WITH D Take 1 tablet by mouth 2 (two) times daily.   diclofenac sodium 1 % Gel Commonly known as: VOLTAREN Apply 2 g topically 4 (four) times daily.   ENSURE ENLIVE PO Take 1 Bottle by mouth daily. Prefers Vanilla   fludrocortisone 0.1 MG tablet Commonly known as: FLORINEF Take 2 tablets (0.2 mg total) by mouth daily. Start taking on: May 23, 2019 What changed: how much to take   gabapentin 300 MG capsule Commonly known as: NEURONTIN Take 1 capsule (300 mg total) by mouth 3 (three) times daily.   levETIRAcetam 500 MG tablet Commonly known as: KEPPRA Take 1 tablet (500 mg total) by mouth 2 (two) times daily.   NON FORMULARY Diet Type: Regular   pantoprazole 40 MG tablet Commonly known as: PROTONIX Take 1 tablet (40 mg total) by mouth 2 (two) times daily.   polyethylene glycol 17 g packet Commonly known as: MIRALAX / GLYCOLAX Take 17 g by mouth daily.   rosuvastatin 5 MG tablet Commonly known as: CRESTOR Take 1 tablet (5 mg total) by mouth daily.   sennosides-docusate sodium 8.6-50 MG tablet Commonly known as: SENOKOT-S Take 2 tablets by mouth 2 (two) times daily as needed for constipation.      Follow-up Information    Tacy Learn, FNP Follow up in 1 week(s).   Specialty: Family Medicine Why: You need a referral to Cardiology for an event monitor and a referral to neurology to follow up for possible seizures.  Contact information: [address] [place] [address] [phone]          Allergies  Allergen Reactions  . Penicillins Hives, Swelling, Rash and Other (See  Comments)    Did it involve swelling of the face/tongue/throat, SOB, or low BP? #  #  #  YES  #  #  #  Did it involve sudden or severe rash/hives, skin peeling, or any reaction on the inside of your mouth or nose? No Did you need to seek medical attention at a hospital or doctor's office? #  #  #  YES  #  #  #  When did it last happen?86 years old.   . Codeine Phosphate [Codeine] Other (See Comments)    headache    Consultations:  EP,neurology   Procedures/Studies: Dg Ribs Unilateral W/chest Left  Result Date: 05/17/2019 CLINICAL DATA:  Pain status post fall EXAM: LEFT RIBS AND CHEST - 3+ VIEW COMPARISON:  For Feb 16, 2019 FINDINGS: The heart size is mildly enlarged. Aortic calcifications are noted. There is some mild but stable height loss involving the midthoracic spine. There is no pneumothorax. There is a subacute appearing mildly displaced fracture involving the eighth rib laterally on the left. There is no additional acute displaced fracture or dislocation. IMPRESSION: 1. Subacute mildly displaced fracture involving the eighth rib laterally on the left. 2. No pneumothorax. 3. No definite acute cardiopulmonary process. Electronically Signed   By: Katherine Mantle M.D.   On: 05/17/2019 19:29   Ct Head  Wo Contrast  Result Date: 05/17/2019 CLINICAL DATA:  Difficulty speaking since yesterday patient fell 2 weeks ago. Right facial bruising. EXAM: CT HEAD WITHOUT CONTRAST CT CERVICAL SPINE WITHOUT CONTRAST TECHNIQUE: Multidetector CT imaging of the head and cervical spine was performed following the standard protocol without intravenous contrast. Multiplanar CT image reconstructions of the cervical spine were also generated. COMPARISON:  Cervical spine CT 02/10/2019. FINDINGS: CT HEAD FINDINGS Brain: There is no evidence of acute intracranial hemorrhage, mass lesion, brain edema or extra-axial fluid collection. There is mild atrophy with prominence of the ventricles and subarachnoid spaces.  There is patchy low-density in the periventricular white matter which is most likely due to chronic small vessel ischemic changes. There is no CT evidence of acute cortical infarction. Vascular: Intracranial vascular calcifications. No hyperdense vessel identified. Skull: Negative for fracture or focal lesion. There are postsurgical changes related to craniocervical fusion posteriorly. The hardware is intact. Sinuses/Orbits: The visualized paranasal sinuses and mastoid air cells are clear. No orbital abnormalities are seen. Previous scleral banding. Other: None. CT CERVICAL SPINE FINDINGS Alignment: 7 mm anterolisthesis at C1-2 secondary to a fracture of the base of the dens. Minimal degenerative anterolisthesis at C7-T1. Skull base and vertebrae: No evidence of acute fracture. Possible partial healing of the dens fracture with unchanged anterior displacement as described. Patient is status post posterior fusion from the occiput through C4. A previously demonstrated screw extending into the spinal canal at C2-3 has been removed. The hardware otherwise appears unchanged. There is limited purchase of the C3 and C4 laminar screws, especially on the left. There is some lucency surrounding the left C4 screw. The posterior arch of C1 has been resected is adequate decompression the craniocervical junction. Soft tissues and spinal canal: No prevertebral fluid or swelling. No visible canal hematoma. Disc levels: There is multilevel cervical spondylosis with disc space narrowing and uncinate spurring. Prominent posterior osteophytes remain at C3-4, contributing to residual central stenosis. The left C4-5 facet joint is ankylosed. Upper chest: Mild biapical scarring. Other: None. IMPRESSION: 1. No acute intracranial findings. 2. No evidence of acute cervical spine fracture, traumatic subluxation or static signs of instability. 3. Stable anterolisthesis at C1-2 secondary to a chronic fracture involving the base of the dens. The  craniocervical junction appears adequately decompressed, and there is possible early healing of the fracture. 4. Interval removal of the previously demonstrated C2 laminar screw extending into the spinal canal. The additional hardware is unchanged with limited osseous purchase of the C3 and C4 laminar screws. Electronically Signed   By: Carey BullocksWilliam  Veazey M.D.   On: 05/17/2019 13:51   Ct Cervical Spine Wo Contrast  Result Date: 05/17/2019 CLINICAL DATA:  Difficulty speaking since yesterday patient fell 2 weeks ago. Right facial bruising. EXAM: CT HEAD WITHOUT CONTRAST CT CERVICAL SPINE WITHOUT CONTRAST TECHNIQUE: Multidetector CT imaging of the head and cervical spine was performed following the standard protocol without intravenous contrast. Multiplanar CT image reconstructions of the cervical spine were also generated. COMPARISON:  Cervical spine CT 02/10/2019. FINDINGS: CT HEAD FINDINGS Brain: There is no evidence of acute intracranial hemorrhage, mass lesion, brain edema or extra-axial fluid collection. There is mild atrophy with prominence of the ventricles and subarachnoid spaces. There is patchy low-density in the periventricular white matter which is most likely due to chronic small vessel ischemic changes. There is no CT evidence of acute cortical infarction. Vascular: Intracranial vascular calcifications. No hyperdense vessel identified. Skull: Negative for fracture or focal lesion. There are postsurgical changes related to  craniocervical fusion posteriorly. The hardware is intact. Sinuses/Orbits: The visualized paranasal sinuses and mastoid air cells are clear. No orbital abnormalities are seen. Previous scleral banding. Other: None. CT CERVICAL SPINE FINDINGS Alignment: 7 mm anterolisthesis at C1-2 secondary to a fracture of the base of the dens. Minimal degenerative anterolisthesis at C7-T1. Skull base and vertebrae: No evidence of acute fracture. Possible partial healing of the dens fracture with  unchanged anterior displacement as described. Patient is status post posterior fusion from the occiput through C4. A previously demonstrated screw extending into the spinal canal at C2-3 has been removed. The hardware otherwise appears unchanged. There is limited purchase of the C3 and C4 laminar screws, especially on the left. There is some lucency surrounding the left C4 screw. The posterior arch of C1 has been resected is adequate decompression the craniocervical junction. Soft tissues and spinal canal: No prevertebral fluid or swelling. No visible canal hematoma. Disc levels: There is multilevel cervical spondylosis with disc space narrowing and uncinate spurring. Prominent posterior osteophytes remain at C3-4, contributing to residual central stenosis. The left C4-5 facet joint is ankylosed. Upper chest: Mild biapical scarring. Other: None. IMPRESSION: 1. No acute intracranial findings. 2. No evidence of acute cervical spine fracture, traumatic subluxation or static signs of instability. 3. Stable anterolisthesis at C1-2 secondary to a chronic fracture involving the base of the dens. The craniocervical junction appears adequately decompressed, and there is possible early healing of the fracture. 4. Interval removal of the previously demonstrated C2 laminar screw extending into the spinal canal. The additional hardware is unchanged with limited osseous purchase of the C3 and C4 laminar screws. Electronically Signed   By: Carey Bullocks M.D.   On: 05/17/2019 13:51   Mr Angio Head Wo Contrast  Result Date: 05/18/2019 CLINICAL DATA:  Speech difficulties.  Fall 2 weeks ago. EXAM: MR HEAD WITHOUT CONTRAST MR CIRCLE OF WILLIS WITHOUT CONTRAST MRA OF THE NECK WITHOUT AND WITH CONTRAST TECHNIQUE: Multiplanar, multiecho pulse sequences of the brain, circle of willis and surrounding structures were obtained without intravenous contrast. Angiographic images of the neck were obtained using MRA technique without and with  intravenous contrast. CONTRAST:  6 mL Gadavist COMPARISON:  Head CT 05/17/2019 FINDINGS: MRI HEAD FINDINGS BRAIN: The midline structures are normal. There is no acute infarct, acute hemorrhage or mass. Early confluent hyperintense T2-weighted signal of the periventricular and deep white matter, most commonly due to chronic ischemic microangiopathy. Generalized atrophy without lobar predilection. Blood-sensitive sequences show no chronic microhemorrhage or superficial siderosis. SKULL AND UPPER CERVICAL SPINE: Posterior craniocervical fusion. SINUSES/ORBITS: No fluid levels or advanced mucosal thickening. No mastoid or middle ear effusion. The orbits are normal. MRA HEAD FINDINGS POSTERIOR CIRCULATION: --Vertebral arteries: Normal V4 segments. --Posterior inferior cerebellar arteries (PICA): Patent origins from the vertebral arteries. --Anterior inferior cerebellar arteries (AICA): Patent origins from the basilar artery. --Basilar artery: Normal. --Superior cerebellar arteries: Normal. --Posterior cerebral arteries (PCA): Normal. The right PCA is predominantly supplied by the posterior communicating artery. ANTERIOR CIRCULATION: --Intracranial internal carotid arteries: Normal. --Anterior cerebral arteries (ACA): Normal. Both A1 segments are present. Patent anterior communicating artery (a-comm). --Middle cerebral arteries (MCA): Normal. MRA NECK FINDINGS Aortic arch: Normal 3 vessel aortic branching pattern. The visualized subclavian arteries are normal. Right carotid system: Normal course and caliber without stenosis or evidence of dissection. Left carotid system: Normal course and caliber without stenosis or evidence of dissection. Vertebral arteries: Left dominant. Vertebral artery origins are normal. Vertebral arteries are normal in course and caliber to  the vertebrobasilar confluence without stenosis or evidence of dissection. IMPRESSION: 1. Chronic ischemic microangiopathy and generalized volume loss without  acute intracranial abnormality. 2. Normal cervical and intracranial MRA. Electronically Signed   By: Ulyses Jarred M.D.   On: 05/18/2019 04:43   Mr Angio Neck W Wo Contrast  Result Date: 05/18/2019 CLINICAL DATA:  Speech difficulties.  Fall 2 weeks ago. EXAM: MR HEAD WITHOUT CONTRAST MR CIRCLE OF WILLIS WITHOUT CONTRAST MRA OF THE NECK WITHOUT AND WITH CONTRAST TECHNIQUE: Multiplanar, multiecho pulse sequences of the brain, circle of willis and surrounding structures were obtained without intravenous contrast. Angiographic images of the neck were obtained using MRA technique without and with intravenous contrast. CONTRAST:  6 mL Gadavist COMPARISON:  Head CT 05/17/2019 FINDINGS: MRI HEAD FINDINGS BRAIN: The midline structures are normal. There is no acute infarct, acute hemorrhage or mass. Early confluent hyperintense T2-weighted signal of the periventricular and deep white matter, most commonly due to chronic ischemic microangiopathy. Generalized atrophy without lobar predilection. Blood-sensitive sequences show no chronic microhemorrhage or superficial siderosis. SKULL AND UPPER CERVICAL SPINE: Posterior craniocervical fusion. SINUSES/ORBITS: No fluid levels or advanced mucosal thickening. No mastoid or middle ear effusion. The orbits are normal. MRA HEAD FINDINGS POSTERIOR CIRCULATION: --Vertebral arteries: Normal V4 segments. --Posterior inferior cerebellar arteries (PICA): Patent origins from the vertebral arteries. --Anterior inferior cerebellar arteries (AICA): Patent origins from the basilar artery. --Basilar artery: Normal. --Superior cerebellar arteries: Normal. --Posterior cerebral arteries (PCA): Normal. The right PCA is predominantly supplied by the posterior communicating artery. ANTERIOR CIRCULATION: --Intracranial internal carotid arteries: Normal. --Anterior cerebral arteries (ACA): Normal. Both A1 segments are present. Patent anterior communicating artery (a-comm). --Middle cerebral arteries  (MCA): Normal. MRA NECK FINDINGS Aortic arch: Normal 3 vessel aortic branching pattern. The visualized subclavian arteries are normal. Right carotid system: Normal course and caliber without stenosis or evidence of dissection. Left carotid system: Normal course and caliber without stenosis or evidence of dissection. Vertebral arteries: Left dominant. Vertebral artery origins are normal. Vertebral arteries are normal in course and caliber to the vertebrobasilar confluence without stenosis or evidence of dissection. IMPRESSION: 1. Chronic ischemic microangiopathy and generalized volume loss without acute intracranial abnormality. 2. Normal cervical and intracranial MRA. Electronically Signed   By: Ulyses Jarred M.D.   On: 05/18/2019 04:43   Mr Brain Wo Contrast  Result Date: 05/18/2019 CLINICAL DATA:  Speech difficulties.  Fall 2 weeks ago. EXAM: MR HEAD WITHOUT CONTRAST MR CIRCLE OF WILLIS WITHOUT CONTRAST MRA OF THE NECK WITHOUT AND WITH CONTRAST TECHNIQUE: Multiplanar, multiecho pulse sequences of the brain, circle of willis and surrounding structures were obtained without intravenous contrast. Angiographic images of the neck were obtained using MRA technique without and with intravenous contrast. CONTRAST:  6 mL Gadavist COMPARISON:  Head CT 05/17/2019 FINDINGS: MRI HEAD FINDINGS BRAIN: The midline structures are normal. There is no acute infarct, acute hemorrhage or mass. Early confluent hyperintense T2-weighted signal of the periventricular and deep white matter, most commonly due to chronic ischemic microangiopathy. Generalized atrophy without lobar predilection. Blood-sensitive sequences show no chronic microhemorrhage or superficial siderosis. SKULL AND UPPER CERVICAL SPINE: Posterior craniocervical fusion. SINUSES/ORBITS: No fluid levels or advanced mucosal thickening. No mastoid or middle ear effusion. The orbits are normal. MRA HEAD FINDINGS POSTERIOR CIRCULATION: --Vertebral arteries: Normal V4  segments. --Posterior inferior cerebellar arteries (PICA): Patent origins from the vertebral arteries. --Anterior inferior cerebellar arteries (AICA): Patent origins from the basilar artery. --Basilar artery: Normal. --Superior cerebellar arteries: Normal. --Posterior cerebral arteries (PCA): Normal. The right  PCA is predominantly supplied by the posterior communicating artery. ANTERIOR CIRCULATION: --Intracranial internal carotid arteries: Normal. --Anterior cerebral arteries (ACA): Normal. Both A1 segments are present. Patent anterior communicating artery (a-comm). --Middle cerebral arteries (MCA): Normal. MRA NECK FINDINGS Aortic arch: Normal 3 vessel aortic branching pattern. The visualized subclavian arteries are normal. Right carotid system: Normal course and caliber without stenosis or evidence of dissection. Left carotid system: Normal course and caliber without stenosis or evidence of dissection. Vertebral arteries: Left dominant. Vertebral artery origins are normal. Vertebral arteries are normal in course and caliber to the vertebrobasilar confluence without stenosis or evidence of dissection. IMPRESSION: 1. Chronic ischemic microangiopathy and generalized volume loss without acute intracranial abnormality. 2. Normal cervical and intracranial MRA. Electronically Signed   By: Deatra Robinson M.D.   On: 05/18/2019 04:43       Subjective:  Patient seen and examined the bedside this morning.  Hemodynamically stable for discharge.I was reported that she had a pause of 2.6-second this morning.  I talked to EP.  As per EP, the plan will remain the same and there is no plan for intervention.EP cleared for discharge.  Discharge Exam: Vitals:   05/22/19 0346 05/22/19 0800  BP: 131/67 (!) 152/74  Pulse: 75 75  Resp: 14 17  Temp: 98.5 F (36.9 C) (!) 97.4 F (36.3 C)  SpO2: 93% 94%   Vitals:   05/21/19 1945 05/21/19 2321 05/22/19 0346 05/22/19 0800  BP: 119/60 112/61 131/67 (!) 152/74  Pulse: 77  82 75 75  Resp: Temp: 98.1 F (36.7 C) 98.7 F (37.1 C) 98.5 F (36.9 C) (!) 97.4 F (36.3 C)  TempSrc: Oral Oral Oral Axillary  SpO2: 94%  93% 94%  Weight:      Height:        General: Pt is alert, awake, not in acute distress Cardiovascular: RRR, S1/S2 +, no rubs, no gallops Respiratory: CTA bilaterally, no wheezing, no rhonchi Abdominal: Soft, NT, ND, bowel sounds + Extremities: no edema, no cyanosis    The results of significant diagnostics from this hospitalization (including imaging, microbiology, ancillary and laboratory) are listed below for reference.     Microbiology: Recent Results (from the past 240 hour(s))  SARS Coronavirus 2 [facility] order, Performed in [facility] hospital lab) Nasopharyngeal Nasopharyngeal Swab     Status: None   Collection Time: 05/17/19  6:18 PM   Specimen: Nasopharyngeal Swab  Result Value Ref Range Status   SARS Coronavirus 2 NEGATIVE NEGATIVE Final    Comment: (NOTE) If result is NEGATIVE SARS-CoV-2 target nucleic acids are NOT DETECTED. The SARS-CoV-2 RNA is generally detectable in upper and lower  respiratory specimens during the acute phase of infection. The lowest  concentration of SARS-CoV-2 viral copies this assay can detect is 250  copies / mL. A negative result does not preclude SARS-CoV-2 infection  and should not be used as the sole basis for treatment or other  patient management decisions.  A negative result may occur with  improper specimen collection / handling, submission of specimen other  than nasopharyngeal swab, presence of viral mutation(s) within the  areas targeted by this assay, and inadequate number of viral copies  (<250 copies / mL). A negative result must be combined with clinical  observations, patient history, and epidemiological information. If result is POSITIVE SARS-CoV-2 target nucleic acids are DETECTED. The SARS-CoV-2 RNA is generally detectable in upper and lower  respiratory  specimens dur ing the acute phase of infection.  Positive  results are indicative of active infection with SARS-CoV-2.  Clinical  correlation with patient history and other diagnostic information is  necessary to determine patient infection status.  Positive results do  not rule out bacterial infection or co-infection with other viruses. If result is PRESUMPTIVE POSTIVE SARS-CoV-2 nucleic acids MAY BE PRESENT.   A presumptive positive result was obtained on the submitted specimen  and confirmed on repeat testing.  While 2019 novel coronavirus  (SARS-CoV-2) nucleic acids may be present in the submitted sample  additional confirmatory testing may be necessary for epidemiological  and / or clinical management purposes  to differentiate between  SARS-CoV-2 and other Sarbecovirus currently known to infect humans.  If clinically indicated additional testing with an alternate test  methodology [phone]) is advised. The SARS-CoV-2 RNA is generally  detectable in upper and lower respiratory sp ecimens during the acute  phase of infection. The expected result is Negative. Fact Sheet for Patients:  BoilerBrush.com.cy Fact Sheet for Healthcare Providers: https://pope.com/ This test is not yet approved or cleared by the [place] FDA and has been authorized for detection and/or diagnosis of SARS-CoV-2 by FDA under an Emergency Use Authorization (EUA).  This EUA will remain in effect (meaning this test can be used) for the duration of the COVID-19 declaration under Section 564(b)(1) of the Act, 21 U.S.C. section 360bbb-3(b)(1), unless the authorization is terminated or revoked sooner. Performed at [facility], [address], [place], [place] [address]      Labs: BNP (last 3 results) No results for input(s): BNP in the last 8760 hours. Basic Metabolic Panel: Recent Labs  Lab 05/17/19 1531 05/17/19 1533 05/18/19 0906 05/19/19 0619  NA  141 141 140 143  K 4.5 3.9 3.6 3.8  CL 106 108 105 109  CO2  --  23 24 25   GLUCOSE 85 91 81 97  BUN 11 11 12 16   CREATININE 0.50 0.55 0.64 0.65  CALCIUM  --  9.2 9.2 8.9   Liver Function Tests: Recent Labs  Lab 05/17/19 1533  AST 20  ALT 10  ALKPHOS 91  BILITOT 0.9  PROT 7.3  ALBUMIN 4.0   No results for input(s): LIPASE, AMYLASE in the last 168 hours. No results for input(s): AMMONIA in the last 168 hours. CBC: Recent Labs  Lab 05/17/19 1302 05/17/19 1531 05/18/19 0906  WBC 4.6  --  3.7*  NEUTROABS 3.2  --  2.2  HGB 11.9* 12.2 11.9*  HCT 38.0 36.0 37.2  MCV 92.0  --  91.6  PLT 190  --  189   Cardiac Enzymes: No results for input(s): CKTOTAL, CKMB, CKMBINDEX, TROPONINI in the last 168 hours. BNP: Invalid input(s): POCBNP CBG: Recent Labs  Lab 05/17/19 1501  GLUCAP 86   D-Dimer No results for input(s): DDIMER in the last 72 hours. Hgb A1c No results for input(s): HGBA1C in the last 72 hours. Lipid Profile No results for input(s): CHOL, HDL, LDLCALC, TRIG, CHOLHDL, LDLDIRECT in the last 72 hours. Thyroid function studies No results for input(s): TSH, T4TOTAL, T3FREE, THYROIDAB in the last 72 hours.  Invalid input(s): FREET3 Anemia work up No results for input(s): VITAMINB12, FOLATE, FERRITIN, TIBC, IRON, RETICCTPCT in the last 72 hours. Urinalysis    Component Value Date/Time   COLORURINE YELLOW 02/23/2019 1900   APPEARANCEUR CLOUDY (A) 02/23/2019 1900   LABSPEC 1.013 02/23/2019 1900   PHURINE 6.0 02/23/2019 1900   GLUCOSEU NEGATIVE 02/23/2019 1900   HGBUR SMALL (A) 02/23/2019 1900   BILIRUBINUR  NEGATIVE 02/23/2019 1900   KETONESUR NEGATIVE 02/23/2019 1900   PROTEINUR NEGATIVE 02/23/2019 1900   NITRITE POSITIVE (A) 02/23/2019 1900   LEUKOCYTESUR LARGE (A) 02/23/2019 1900   Sepsis Labs Invalid input(s): PROCALCITONIN,  WBC,  LACTICIDVEN Microbiology Recent Results (from the past 240 hour(s))  SARS Coronavirus 2 [facility](Hospital order, Performed in  [facility] hospital lab) Nasopharyngeal Nasopharyngeal Swab     Status: None   Collection Time: 05/17/19  6:18 PM   Specimen: Nasopharyngeal Swab  Result Value Ref Range Status   SARS Coronavirus 2 NEGATIVE NEGATIVE Final    Comment: (NOTE) If result is NEGATIVE SARS-CoV-2 target nucleic acids are NOT DETECTED. The SARS-CoV-2 RNA is generally detectable in upper and lower  respiratory specimens during the acute phase of infection. The lowest  concentration of SARS-CoV-2 viral copies this assay can detect is 250  copies / mL. A negative result does not preclude SARS-CoV-2 infection  and should not be used as the sole basis for treatment or other  patient management decisions.  A negative result may occur with  improper specimen collection / handling, submission of specimen other  than nasopharyngeal swab, presence of viral mutation(s) within the  areas targeted by this assay, and inadequate number of viral copies  (<250 copies / mL). A negative result must be combined with clinical  observations, patient history, and epidemiological information. If result is POSITIVE SARS-CoV-2 target nucleic acids are DETECTED. The SARS-CoV-2 RNA is generally detectable in upper and lower  respiratory specimens dur ing the acute phase of infection.  Positive  results are indicative of active infection with SARS-CoV-2.  Clinical  correlation with patient history and other diagnostic information is  necessary to determine patient infection status.  Positive results do  not rule out bacterial infection or co-infection with other viruses. If result is PRESUMPTIVE POSTIVE SARS-CoV-2 nucleic acids MAY BE PRESENT.   A presumptive positive result was obtained on the submitted specimen  and confirmed on repeat testing.  While 2019 novel coronavirus  (SARS-CoV-2) nucleic acids may be present in the submitted sample  additional confirmatory testing may be necessary for epidemiological  and / or clinical  management purposes  to differentiate between  SARS-CoV-2 and other Sarbecovirus currently known to infect humans.  If clinically indicated additional testing with an alternate test  methodology [phone]([id number]) is advised. The SARS-CoV-2 RNA is generally  detectable in upper and lower respiratory sp ecimens during the acute  phase of infection. The expected result is Negative. Fact Sheet for Patients:  BoilerBrush.com.cyhttps://www.fda.gov/media/136312/download Fact Sheet for Healthcare Providers: https://pope.com/https://www.fda.gov/media/136313/download This test is not yet approved or cleared by the [place] FDA and has been authorized for detection and/or diagnosis of SARS-CoV-2 by FDA under an Emergency Use Authorization (EUA).  This EUA will remain in effect (meaning this test can be used) for the duration of the COVID-19 declaration under Section 564(b)(1) of the Act, 21 U.S.C. section 360bbb-3(b)(1), unless the authorization is terminated or revoked sooner. Performed at [facility], [address] [place], [place] [address]     Please note: You were cared for by a hospitalist during your hospital stay. Once you are discharged, your primary care physician will handle any further medical issues. Please note that NO REFILLS for any discharge medications will be authorized once you are discharged, as it is imperative that you return to your primary care physician (or establish a relationship with a primary care physician if you do not have one) for your post hospital discharge needs so  that they can reassess your need for medications and monitor your lab values.    Time coordinating discharge: 40 minutes  SIGNED:   Burnadette Pop, MD  [facility] 05/22/2019, 10:43 AM Pager [phone]  If 7PM-7AM, please contact night-coverage [email or web address] Password [email or web address]

## 2019-05-22 NOTE — Progress Notes (Signed)
Tele reported a pause of 4.29 then 5.71 seconds. Dr. Tawanna Solo notified. EP with cardiology notified. Rapid Response called to lay eyes on patient. Orders to keep NPO after midnight and possible pacemaker placement tomorrow. Nurse will continue to monitor. [place]

## 2019-05-22 NOTE — Progress Notes (Addendum)
Progress Note  Patient Name: Denise Espinoza Date of Encounter: 05/22/2019  Primary Cardiologist: Laural Golden, MD   Subjective   Just finishing up lunch, no complaints  Inpatient Medications    Scheduled Meds: . aspirin EC  81 mg Oral Daily  . enoxaparin (LOVENOX) injection  40 mg Subcutaneous Q24H  . fludrocortisone  0.2 mg Oral Daily  . gabapentin  300 mg Oral TID  . levETIRAcetam  500 mg Oral BID  . pantoprazole  40 mg Oral BID  . polyethylene glycol  17 g Oral Daily  . QUEtiapine  25 mg Oral QHS  . rosuvastatin  5 mg Oral Daily   Continuous Infusions:  PRN Meds: acetaminophen **OR** acetaminophen (TYLENOL) oral liquid 160 mg/5 mL **OR** acetaminophen, senna-docusate   Vital Signs    Vitals:   05/21/19 2321 05/22/19 0346 05/22/19 0800 05/22/19 1300  BP: 112/61 131/67 (!) 152/74 (!) 113/58  Pulse: 82 75 75 72  Resp: 15 14 17 17   Temp: 98.7 F (37.1 C) 98.5 F (36.9 C) (!) 97.4 F (36.3 C) 98.1 F (36.7 C)  TempSrc: Oral Oral Axillary Axillary  SpO2:  93% 94% 100%  Weight:      Height:        Intake/Output Summary (Last 24 hours) at 05/22/2019 1417 Last data filed at 05/22/2019 0800 Gross per 24 hour  Intake 380 ml  Output 600 ml  Net -220 ml   Last 3 Weights 05/17/2019 05/17/2019 05/07/2019  Weight (lbs) 141 lb 147 lb (No Data)  Weight (kg) 63.957 kg 66.679 kg (No Data)      Telemetry    SR, 70's, occasional blocks PACs, some infrequent Mobiz I, and very short pauses <3 seconds - Personally Reviewed with Dr. 05/09/2019  ECG    No new EKGs - Personally Reviewed  Physical Exam   GEN: No acute distress.  (facial ecchymosis R) Neck: No JVD Cardiac: RRR, no murmurs, rubs, or gallops.  Respiratory: CTA b/l. GI: Soft, nontender, non-distended  MS: No edema; No deformity. Neuro:  Nonfocal  Psych: Normal affect   Labs    High Sensitivity Troponin:  No results for input(s): TROPONINIHS in the last 720 hours.    Cardiac EnzymesNo results for input(s):  TROPONINI in the last 168 hours. No results for input(s): TROPIPOC in the last 168 hours.   Chemistry Recent Labs  Lab 05/17/19 1533 05/18/19 0906 05/19/19 0619  NA 141 140 143  K 3.9 3.6 3.8  CL 108 105 109  CO2 23 24 25   GLUCOSE 91 81 97  BUN 11 12 16   CREATININE 0.55 0.64 0.65  CALCIUM 9.2 9.2 8.9  PROT 7.3  --   --   ALBUMIN 4.0  --   --   AST 20  --   --   ALT 10  --   --   ALKPHOS 91  --   --   BILITOT 0.9  --   --   GFRNONAA >60 >60 >60  GFRAA >60 >60 >60  ANIONGAP 10 11 9      Hematology Recent Labs  Lab 05/17/19 1302 05/17/19 1531 05/18/19 0906  WBC 4.6  --  3.7*  RBC 4.13  --  4.06  HGB 11.9* 12.2 11.9*  HCT 38.0 36.0 37.2  MCV 92.0  --  91.6  MCH 28.8  --  29.3  MCHC 31.3  --  32.0  RDW 14.1  --  13.9  PLT 190  --  189    BNPNo  results for input(s): BNP, PROBNP in the last 168 hours.   DDimer No results for input(s): DDIMER in the last 168 hours.   Radiology    No results found.  Cardiac Studies   05/18/2019: TTE IMPRESSIONS 1. The left ventricle has normal systolic function, with an ejection fraction of 55-60%. The cavity size was normal. There is moderately increased left ventricular wall thickness. Left ventricular diastolic parameters were normal. 2. The right ventricle has normal systolic function. The cavity was normal. There is no increase in right ventricular wall thickness. 3. Moderate thickening of the mitral valve leaflet. Mild calcification of the mitral valve leaflet. 4. Tricuspid valve regurgitation is mild-moderate. 5. The aortic valve is tricuspid. Moderate thickening of the aortic valve. Sclerosis without any evidence of stenosis of the aortic valve. Aortic valve regurgitation is mild by color flow Doppler. 6. The aorta is normal in size and structure.  Patient Profile     85 y.o. female C-spine stenosis w/myelopathy, centra clord syndrome, OA, ?SVT  story follows as below admitted 4/28-02/15/2023 displaced type II dens  fracture with ORIF on 4/30. ---She was discharged to CIR on 5/9 for significant functional deficits. Follow-up CT showed C2 laminar screw intruding into the posterior spinal canal and right lateral recess. It was removed on 5/5 but she continued to have declining worsening of numbness. ---She was admitted to rehab 5/9-03/12/2019 for central cord syndrome. She also had a UTI and anemia. She was noted to have orthostatic hypotension and Florinef was started during this admission. ---After discharge from rehab on 7/11, she was seen by Dr. Hermelinda Medicus on 7/29. At that time, there was concern about lower extremity edema. This was felt to be dependent edema but it was noted that gabapentin and Florinef could contribute to this so the Florinef was stopped and gabapentin was tapered. She was noted to have a history of SVT and was having problems with presyncope. She was to continue on metoprolol daily. She was having sudden "dropouts", cause unclear,as they would start while she was seated. Cardiology referral made.  8/8, patient was admitted for syncopal episode. Cardiology asked to evaluate 2/2 bradycardia/pauses.  Her Toprol was stopped 05/19/2019 last dose 1007  When she was admitted, orthostatic vital signs were only mildly positive. -The Florinef was held, but by 8/10, her orthostatic vital signs were markedly positive. -The Florinef was restarted.  EP called 05/20/2019 for sinus pauses some as long as 6.2 seconds and symptomatic with LOC, transient bradycardia  05/21/2019, no further pauses or significant bradycardia were appreciated and PPM was not pursued  Assessment & Plan    1. Recurrent syncope     Clear some (most) is her orthostatic issue, that responded to florinef  2. Sinus node dysfunction           The patient has maintained SR generally in the 70's this afternoon she had 2 pauses, 4.3 and 5.7 seconds, both at rest while in bed and without symptoms. No nodal blocking  agents on board, no reversible causes at this time I have made Dr. Elberta Fortis aware, will need pacing I will check a TSH today to r/o though low suspicion   I have discussed this with the patient and rational.  She had hoped to avoid but understands.  We discussed implant procedure, potential risks and benefits, she is agreeable to proceed.  Is on the schedule for tomorrow.     For questions or updates, please contact [facility] Please consult [email or web address] for contact info under  Signed, Baldwin Jamaica, PA-C  05/22/2019, 2:17 PM     I have reviewed the above assessment and plan and discussed with EP PA.  Changes to above are made where necessary.  Pt with ongoing symptomatic bradycardia.  Will anticipate pacing tomorrow if schedule allows and patient amenable.  NPO after breakfast in am.  Co Sign: Thompson Grayer, MD

## 2019-05-22 NOTE — Significant Event (Signed)
Rapid Response Event Note  Overview: Cardiac - Pauses  Initial Focused Assessment: Called by nurse about patient having two long pauses on telemetry. I am familiar with Denise Espinoza, I saw her on Tuesday for bradycardia and pauses. When I arrived, Denise Espinoza was laying in bed, eating her lunch, alert and oriented, quite conversant, she stated that she feels much better today and does not feel like she did on Tuesday. She denies any symptoms, currently HR 70s SR, stable CP, skin warm and dry.   Interventions: -- None--  Plan of Care: -- EP PA came to see patient, plan for PPM placement tomorrow.   Event Summary:  Start Time [place] End Time 1410  Marcos Peloso R

## 2019-05-22 NOTE — Progress Notes (Signed)
2.46 second pauses at 8:44. Dr. Tawanna Solo notified in hallway. Continue to monitor. Karee Forge A Callum Wolf

## 2019-05-22 NOTE — Progress Notes (Signed)
Physical Therapy Treatment Patient Details Name: Denise Espinoza MRN: [id number] DOB: 1930-04-09 Today's Date: 05/22/2019    History of Present Illness This 85 y.o. female admitted with transient spells of impaired speech, confusion, and lethargy.  MRI of brain was negative for acute infarct.  Current diagnosis; encephalopathy with confusion/and lethargy consistent with post ictal state after transient neurological spell.  Work up underway.  PMH: SVT, cervical cord injury s/p cervical fusion.     PT Comments    Patient seen for mobility progression. Pt tolerated increased activity this session however does continue to c/o "wooziness" with postural changes. See BP below in general comments. Missed beats noted on telemetry. Pt requires grossly min guard for safety with OOB mobility using RW. Pt will benefit from further skilled PT services to maximize independence and safety with mobility.     Follow Up Recommendations  Home health PT;Supervision/Assistance - 24 hour     Equipment Recommendations  None recommended by PT    Recommendations for Other Services       Precautions / Restrictions Precautions Precautions: Fall Restrictions Weight Bearing Restrictions: No    Mobility  Bed Mobility Overal bed mobility: Needs Assistance Bed Mobility: Supine to Sit     Supine to sit: Min guard     General bed mobility comments: increased time and effort; use of rail  Transfers Overall transfer level: Needs assistance Equipment used: Rolling walker (2 wheeled) Transfers: Sit to/from Stand Sit to Stand: Min guard         General transfer comment: min guard for safety; assist to stabilize RW   Ambulation/Gait Ambulation/Gait assistance: Min guard Gait Distance (Feet): 100 Feet Assistive device: Rolling walker (2 wheeled) Gait Pattern/deviations: Step-through pattern;Decreased stride length;Trunk flexed Gait velocity: decreased   General Gait Details: decreased cadence and stride  length; pt without LOB and grossly steady gait with bilat UE support; min guard for safety especially when turning    Stairs             Wheelchair Mobility    Modified Rankin (Stroke Patients Only)       Balance Overall balance assessment: Needs assistance   Sitting balance-Leahy Scale: Fair Sitting balance - Comments: able to maintain static sitting with supervision    Standing balance support: Bilateral upper extremity supported;During functional activity Standing balance-Leahy Scale: Poor Standing balance comment: requires UE support                             Cognition Arousal/Alertness: Awake/alert Behavior During Therapy: WFL for tasks assessed/performed Overall Cognitive Status: No family/caregiver present to determine baseline cognitive functioning Area of Impairment: Problem solving                             Problem Solving: Slow processing;Requires verbal cues        Exercises      General Comments General comments (skin integrity, edema, etc.): supine BP 115/63 (78), in sitting 131/74 (89), in standing 100/62 (73), and in sitting after ambulation 136/73 (91); missed beats noted on telemetry      Pertinent Vitals/Pain Pain Assessment: Faces Faces Pain Scale: Hurts a little bit Pain Location: generalized Pain Descriptors / Indicators: Sore Pain Intervention(s): Monitored during session    Home Living                      Prior Function  PT Goals (current goals can now be found in the care plan section) Acute Rehab PT Goals Patient Stated Goal: to return home Progress towards PT goals: Progressing toward goals    Frequency    Min 4X/week      PT Plan Current plan remains appropriate    Co-evaluation              AM-PAC PT "6 Clicks" Mobility   Outcome Measure  Help needed turning from your back to your side while in a flat bed without using bedrails?: None Help needed moving  from lying on your back to sitting on the side of a flat bed without using bedrails?: A Little Help needed moving to and from a bed to a chair (including a wheelchair)?: A Little Help needed standing up from a chair using your arms (e.g., wheelchair or bedside chair)?: A Little Help needed to walk in hospital room?: A Little Help needed climbing 3-5 steps with a railing? : A Lot 6 Click Score: 18    End of Session Equipment Utilized During Treatment: Gait belt Activity Tolerance: Patient tolerated treatment well Patient left: in chair;with call bell/phone within reach;with chair alarm set Nurse Communication: Mobility status PT Visit Diagnosis: Unsteadiness on feet (R26.81)     Time: 5462-7035 PT Time Calculation (min) (ACUTE ONLY): 37 min  Charges:  $Gait Training: 8-22 mins $Therapeutic Activity: 8-22 mins                     Earney Navy, PTA Acute Rehabilitation Services Pager: [phone] Office: [phone]     Darliss Cheney 05/22/2019, 10:33 AM

## 2019-05-23 ENCOUNTER — Inpatient Hospital Stay (HOSPITAL_COMMUNITY)
Admission: EM | Disposition: A | Discharge status: Home Health | Payer: Self-pay | Source: Home / Self Care | Attending: Internal Medicine

## 2019-05-23 DIAGNOSIS — R4789 Other speech disturbances: Secondary | ICD-10-CM

## 2019-05-23 DIAGNOSIS — I495 Sick sinus syndrome: Secondary | ICD-10-CM

## 2019-05-23 HISTORY — PX: PACEMAKER IMPLANT: EP1218

## 2019-05-23 SURGERY — PACEMAKER IMPLANT

## 2019-05-23 MED ORDER — SODIUM CHLORIDE 0.9 % IV SOLN
INTRAVENOUS | Status: AC
  Filled 2019-05-23: qty 2

## 2019-05-23 MED ORDER — ONDANSETRON HCL 4 MG/2ML IJ SOLN: 4.0000 mg | Freq: Four times a day (QID) | INTRAMUSCULAR | Status: DC | PRN

## 2019-05-23 MED ORDER — VANCOMYCIN HCL IN DEXTROSE 1-5 GM/200ML-% IV SOLN
INTRAVENOUS | Status: AC
  Filled 2019-05-23: qty 200

## 2019-05-23 MED ORDER — HEPARIN (PORCINE) IN NACL 1000-0.9 UT/500ML-% IV SOLN
INTRAVENOUS | Status: DC | PRN
  Administered 2019-05-23: 500 mL

## 2019-05-23 MED ORDER — LIDOCAINE HCL 1 % IJ SOLN
INTRAMUSCULAR | Status: AC
  Filled 2019-05-23: qty 60

## 2019-05-23 MED ORDER — MIDAZOLAM HCL 5 MG/5ML IJ SOLN
INTRAMUSCULAR | Status: AC
  Filled 2019-05-23: qty 5

## 2019-05-23 MED ORDER — FENTANYL CITRATE (PF) 100 MCG/2ML IJ SOLN
INTRAMUSCULAR | Status: AC
  Filled 2019-05-23: qty 2

## 2019-05-23 MED ORDER — IOHEXOL 350 MG/ML SOLN
INTRAVENOUS | Status: DC | PRN
  Administered 2019-05-23 (×2): 15 mL via INTRAVENOUS

## 2019-05-23 MED ORDER — LIDOCAINE HCL (PF) 1 % IJ SOLN
INTRAMUSCULAR | Status: DC | PRN
  Administered 2019-05-23: 45 mL

## 2019-05-23 MED ORDER — VANCOMYCIN HCL IN DEXTROSE 1-5 GM/200ML-% IV SOLN
1000.0000 mg | Freq: Two times a day (BID) | INTRAVENOUS | Status: AC
  Administered 2019-05-24: 1000 mg via INTRAVENOUS
  Filled 2019-05-23: qty 200

## 2019-05-23 MED ORDER — HEPARIN (PORCINE) IN NACL 1000-0.9 UT/500ML-% IV SOLN
INTRAVENOUS | Status: AC
  Filled 2019-05-23: qty 500

## 2019-05-23 SURGICAL SUPPLY — 8 items
CABLE SURGICAL S-101-97-12 (CABLE) ×3 IMPLANT
IPG PACE AZUR XT DR MRI W1DR01 (Pacemaker) IMPLANT
LEAD CAPSURE NOVUS 45CM (Lead) ×2 IMPLANT
LEAD CAPSURE NOVUS 5076-52CM (Lead) ×2 IMPLANT
PACE AZURE XT DR MRI W1DR01 (Pacemaker) ×3 IMPLANT
PAD PRO RADIOLUCENT 2001M-C (PAD) ×3 IMPLANT
SHEATH 7FR PRELUDE SNAP 13 (SHEATH) ×4 IMPLANT
TRAY PACEMAKER INSERTION (PACKS) ×3 IMPLANT

## 2019-05-23 NOTE — Progress Notes (Signed)
Physical Therapy Cancellation Note   05/23/19 1515  PT Visit Information  Last PT Received On 05/23/19  Reason Eval/Treat Not Completed Patient at procedure or test/unavailable. Pt off unit for pacemaker placement. PT will continue to follow acutely.   Earney Navy, PTA Acute Rehabilitation Services Pager: [phone] Office: [phone]

## 2019-05-23 NOTE — H&P (Signed)
Denise Espinoza has presented today for surgery, with the diagnosis of sinus pauses.  The various methods of treatment have been discussed with the patient and family. After consideration of risks, benefits and other options for treatment, the patient has consented to  Procedure(s): Pacemaker implant as a surgical intervention .  Risks include but not limited to bleeding, tamponade, infection, pneumothorax, among others. The patient's history has been reviewed, patient examined, no change in status, stable for surgery.  I have reviewed the patient's chart and labs.  Questions were answered to the patient's satisfaction.    Sonakshi Rolland Curt Bears, MD 05/23/2019 10:22 AM

## 2019-05-23 NOTE — Progress Notes (Addendum)
Progress Note  Patient Name: Denise Espinoza Date of Encounter: 05/23/2019  Primary Cardiologist: Laural Golden, MD   Subjective   Just waking up this AM, she reports she has been working with PT, has been out of bed yesterday/last couple days.  No near syncope or syncope  Inpatient Medications    Scheduled Meds: . aspirin EC  81 mg Oral Daily  . chlorhexidine  60 mL Topical Once  . fludrocortisone  0.2 mg Oral Daily  . gabapentin  300 mg Oral TID  . gentamicin irrigation  80 mg Irrigation To Cath  . levETIRAcetam  500 mg Oral BID  . pantoprazole  40 mg Oral BID  . polyethylene glycol  17 g Oral Daily  . QUEtiapine  25 mg Oral QHS  . rosuvastatin  5 mg Oral Daily  . sodium chloride flush  3 mL Intravenous Q12H   Continuous Infusions: . sodium chloride 50 mL/hr at 05/23/19 0404  . sodium chloride 250 mL (05/23/19 0008)  . vancomycin     PRN Meds: acetaminophen **OR** acetaminophen (TYLENOL) oral liquid 160 mg/5 mL **OR** acetaminophen, senna-docusate, sodium chloride flush   Vital Signs    Vitals:   05/22/19 2017 05/23/19 0041 05/23/19 0403 05/23/19 0811  BP: (!) 147/74 (!) 124/107 (!) 121/54 135/76  Pulse: 73 81 69 75  Resp: 18 16 17 16   Temp: 98.8 F (37.1 C) 98.5 F (36.9 C) 98.1 F (36.7 C)   TempSrc: Oral Oral Axillary   SpO2: 94% 94% 93% 93%  Weight:      Height:        Intake/Output Summary (Last 24 hours) at 05/23/2019 0905 Last data filed at 05/23/2019 0404 Gross per 24 hour  Intake 763.25 ml  Output 750 ml  Net 13.25 ml   Last 3 Weights 05/17/2019 05/17/2019 05/07/2019  Weight (lbs) 141 lb 147 lb (No Data)  Weight (kg) 63.957 kg 66.679 kg (No Data)      Telemetry    SR, 70's, no further prolonged pauses from yesterday - Personally Reviewed with Dr. 05/09/2019  ECG    No new EKGs - Personally Reviewed  Physical Exam   No change in exam today GEN: No acute distress.  (facial ecchymosis R) Neck: No JVD Cardiac: RRR, no murmurs, rubs, or gallops.   Respiratory: CTA b/l. GI: Soft, nontender, non-distended  MS: No edema; No deformity. Neuro:  Nonfocal  Psych: Normal affect   Labs    High Sensitivity Troponin:  No results for input(s): TROPONINIHS in the last 720 hours.    Cardiac EnzymesNo results for input(s): TROPONINI in the last 168 hours. No results for input(s): TROPIPOC in the last 168 hours.   Chemistry Recent Labs  Lab 05/17/19 1533 05/18/19 0906 05/19/19 0619  NA 141 140 143  K 3.9 3.6 3.8  CL 108 105 109  CO2 23 24 25   GLUCOSE 91 81 97  BUN 11 12 16   CREATININE 0.55 0.64 0.65  CALCIUM 9.2 9.2 8.9  PROT 7.3  --   --   ALBUMIN 4.0  --   --   AST 20  --   --   ALT 10  --   --   ALKPHOS 91  --   --   BILITOT 0.9  --   --   GFRNONAA >60 >60 >60  GFRAA >60 >60 >60  ANIONGAP 10 11 9      Hematology Recent Labs  Lab 05/17/19 1302 05/17/19 1531 05/18/19 0906  WBC 4.6  --  3.7*  RBC 4.13  --  4.06  HGB 11.9* 12.2 11.9*  HCT 38.0 36.0 37.2  MCV 92.0  --  91.6  MCH 28.8  --  29.3  MCHC 31.3  --  32.0  RDW 14.1  --  13.9  PLT 190  --  189    BNPNo results for input(s): BNP, PROBNP in the last 168 hours.   DDimer No results for input(s): DDIMER in the last 168 hours.   Radiology    No results found.  Cardiac Studies   05/18/2019: TTE IMPRESSIONS 1. The left ventricle has normal systolic function, with an ejection fraction of 55-60%. The cavity size was normal. There is moderately increased left ventricular wall thickness. Left ventricular diastolic parameters were normal. 2. The right ventricle has normal systolic function. The cavity was normal. There is no increase in right ventricular wall thickness. 3. Moderate thickening of the mitral valve leaflet. Mild calcification of the mitral valve leaflet. 4. Tricuspid valve regurgitation is mild-moderate. 5. The aortic valve is tricuspid. Moderate thickening of the aortic valve. Sclerosis without any evidence of stenosis of the aortic valve.  Aortic valve regurgitation is mild by color flow Doppler. 6. The aorta is normal in size and structure.  Patient Profile     85 y.o. female C-spine stenosis w/myelopathy, centra clord syndrome, OA, ?SVT  story follows as below admitted 4/28-02/15/2023 displaced type II dens fracture with ORIF on 4/30. ---She was discharged to CIR on 5/9 for significant functional deficits. Follow-up CT showed C2 laminar screw intruding into the posterior spinal canal and right lateral recess. It was removed on 5/5 but she continued to have declining worsening of numbness. ---She was admitted to rehab 5/9-03/12/2019 for central cord syndrome. She also had a UTI and anemia. She was noted to have orthostatic hypotension and Florinef was started during this admission. ---After discharge from rehab on 7/11, she was seen by Dr. Tessa Lerner on 7/29. At that time, there was concern about lower extremity edema. This was felt to be dependent edema but it was noted that gabapentin and Florinef could contribute to this so the Florinef was stopped and gabapentin was tapered. She was noted to have a history of SVT and was having problems with presyncope. She was to continue on metoprolol daily. She was having sudden "dropouts", cause unclear,as they would start while she was seated. Cardiology referral made.  8/8, patient was admitted for syncopal episode. Cardiology asked to evaluate 2/2 bradycardia/pauses.  Her Toprol was stopped 05/19/2019 last dose 1007  When she was admitted, orthostatic vital signs were only mildly positive. -The Florinef was held, but by 8/10, her orthostatic vital signs were markedly positive. -The Florinef was restarted.  EP called 05/20/2019 for sinus pauses some as long as 6.2 seconds and symptomatic with LOC, transient bradycardia  05/21/2019, no further pauses or significant bradycardia were appreciated and PPM was not pursued  Assessment & Plan    1. Recurrent syncope     Clear  some (most) is her orthostatic issue, that responded to florinef  2. Sinus node dysfunction           The patient has maintained SR generally in the 70's yesterday afternoon she had 2 longer pauses, 4.3 and 5.7 seconds, both were at rest while in bed and without symptoms. No nodal blocking agents on board, no reversible causes at this time, TSH was wnl   Dr. Curt Bears has seen and examined the patient this AM, re-reviewed rational for PPM, the  procedure, potential risks and benefits, she remains agreeable to proceed   For questions or updates, please contact [facility] Please consult [email or web address] for contact info under        Signed, Sheilah Pigeon, PA-C  05/23/2019, 9:05 AM     I have seen and examined this patient with [name] Dowse.  Agree with above, note added to reflect my findings.  On exam, RRR, no murmurs, lungs clear.  With continued episodes of sinus pauses with history of syncope.  Medications have all been stopped.  We will plan for pacemaker implant today.  Will M. Camnitz MD 05/23/2019 10:21 AM

## 2019-05-23 NOTE — Progress Notes (Signed)
Patient arrived back to unit. Pacemaker on left chest has occlusive dressing, clean dry and intact. Patient A&O x4. States no pain just uncomfortable. Will continue to monitor. Call bell within reach, bed in lowest position. [place]

## 2019-05-23 NOTE — Progress Notes (Signed)
PROGRESS NOTE    Denise Espinoza  XBM:[id number] DOB: October 18, 1929 DOA: 05/17/2019 PCP: Beola Cord, FNP   Brief Narrative:  Patient is 85 year old female with history of cervical spine stenosis with myelopathy, central cord syndrome, SVT was brought to the emergency department with complaints of word finding difficulty, dysarthria.  Patient was evaluated by neurology after admission.  MRI did not show any stroke.  Suspected to have seizures and started on Keppra.  Hospital course remarkable for multiple episodes of significant bradycardia/pauses causing syncope.  EP was consulted.  Discontinued beta-blocker. Undergoing pacemaker placement today.  Assessment & Plan:   Principal Problem:   Word finding difficulty Active Problems:   Stenosis of cervical spine with myelopathy (HCC)   Central cord syndrome (HCC)   Orthostatic hypotension   History of supraventricular tachycardia   H/O Cervical  laminectomy   Supraventricular tachycardia (HCC)   GERD without esophagitis   Chronic constipation   Syncope   Seizures (HCC)   Slurred speech   Bradycardia/syncope/pause:Had multiple pauses as captured by tele. Beta-blocker discontinued .EP following and undergoing pacemaker placement today.  Dysarthria:Presented with word finding difficulty, inability to finish sentences. Neurology was following. Head and cervical CT were unremarkable. MRI was negative for acute ischemic stroke and shows chronic ischemic microangiopathy. MRAis unremarkable. 2D echo did not show any significant abnormalities. EEG did not reveal any epileptiform discharges. Neurology recommended to start on Keppra 500 mg twice a day. Trazodone discontinued. Recommended to follow-up with neurology in 3 to 4 weeks as an outpatient.  Fall: Reported falling at home. PT/OT consulted and recommended home health physical therapy/Occupational Therapy.  Dizziness/orthostatic hypotension: Has history of orthostatic  hypotension. Currently on Florinef. Metoprolol discontinued. Increase the dose of Florinef to 0.2 mg daily. Plan is to keep her blood pressure slightly elevated to prevent orthostatic hypotension.  Ribfracture: Denies any pain. Secondary to fall. Chest x-ray revealed subacute mildly displaced fracture involving the eighth rib on the left. Continue supportive care  History of cervical spine stenosis with myelopathy/central cord syndrome status post surgery Displaced type II odontoid fracture requiring C1 laminectomy, C2 ORIF and occipital to C4 PLIF on 02/06/2019. Patient subsequently required removal of C2 translaminar screw on 02/11/2019 per Dr. Kathyrn Sheriff  Hyperlipidemia: Continue Crestor  GERD: Continue PPI        DVT prophylaxis: Lovenox Code Status: partial Family Communication: None Disposition Plan: Home tomorrow   Consultants: Cardiology, neurology  Procedures: EEG, echocardiogram, MRI  Antimicrobials:  Anti-infectives (From admission, onward)   Start     Dose/Rate Route Frequency Ordered Stop   05/23/19 0600  gentamicin (GARAMYCIN) 80 mg in sodium chloride 0.9 % 500 mL irrigation     80 mg Irrigation To Cath Lab 05/22/19 1832 05/24/19 0600   05/23/19 0600  vancomycin (VANCOCIN) IVPB 1000 mg/200 mL premix     1,000 mg 200 mL/hr over 60 Minutes Intravenous To Cath Lab 05/22/19 1832 05/24/19 0600   05/17/19 1845  ciprofloxacin (CIPRO) tablet 500 mg  Status:  Discontinued     500 mg Oral  Once 05/17/19 1844 05/17/19 1845   05/17/19 1845  metroNIDAZOLE (FLAGYL) tablet 500 mg  Status:  Discontinued     500 mg Oral  Once 05/17/19 1844 05/17/19 1845      Subjective:  Patient seen and examined the bedside this morning.  Hemodynamically stable.  Comfortable.  Underwent pacemaker placement today.  Objective: Vitals:   05/22/19 2017 05/23/19 0041 05/23/19 0403 05/23/19 0811  BP: (!) 147/74 (!) 124/107 (!) 121/54 135/76  Pulse: 73 81 69 75  Resp: 18 16 17 16    Temp: 98.8 F (37.1 C) 98.5 F (36.9 C) 98.1 F (36.7 C)   TempSrc: Oral Oral Axillary   SpO2: 94% 94% 93% 93%  Weight:      Height:        Intake/Output Summary (Last 24 hours) at 05/23/2019 1128 Last data filed at 05/23/2019 0404 Gross per 24 hour  Intake 763.25 ml  Output 750 ml  Net 13.25 ml   Filed Weights   05/17/19 1303 05/17/19 2250  Weight: 66.7 kg 64 kg    Examination:  General exam: Appears calm and comfortable ,Not in distress,average built, pleasant elderly female HEENT:PERRL,Oral mucosa moist, Ear/Nose normal on gross exam, bruise on the right face Respiratory system: Bilateral equal air entry, normal vesicular breath sounds, no wheezes or crackles  Cardiovascular system: S1 & S2 heard, RRR. No JVD, murmurs, rubs, gallops or clicks. No pedal edema. Gastrointestinal system: Abdomen is nondistended, soft and nontender. No organomegaly or masses felt. Normal bowel sounds heard. Central nervous system: Alert and oriented. No focal neurological deficits. Extremities: No edema, no clubbing ,no cyanosis, distal peripheral pulses palpable. Skin: No rashes, lesions or ulcers,no icterus ,no pallor    Data Reviewed: I have personally reviewed following labs and imaging studies  CBC: Recent Labs  Lab 05/17/19 1302 05/17/19 1531 05/18/19 0906  WBC 4.6  --  3.7*  NEUTROABS 3.2  --  2.2  HGB 11.9* 12.2 11.9*  HCT 38.0 36.0 37.2  MCV 92.0  --  91.6  PLT 190  --  189   Basic Metabolic Panel: Recent Labs  Lab 05/17/19 1531 05/17/19 1533 05/18/19 0906 05/19/19 0619  NA 141 141 140 143  K 4.5 3.9 3.6 3.8  CL 106 108 105 109  CO2  --  23 24 25   GLUCOSE 85 91 81 97  BUN 11 11 12 16   CREATININE 0.50 0.55 0.64 0.65  CALCIUM  --  9.2 9.2 8.9   GFR: Estimated Creatinine Clearance: 44.6 mL/min (by C-G formula based on SCr of 0.65 mg/dL). Liver Function Tests: Recent Labs  Lab 05/17/19 1533  AST 20  ALT 10  ALKPHOS 91  BILITOT 0.9  PROT 7.3  ALBUMIN 4.0    No results for input(s): LIPASE, AMYLASE in the last 168 hours. No results for input(s): AMMONIA in the last 168 hours. Coagulation Profile: Recent Labs  Lab 05/17/19 1538  INR 1.1   Cardiac Enzymes: No results for input(s): CKTOTAL, CKMB, CKMBINDEX, TROPONINI in the last 168 hours. BNP (last 3 results) No results for input(s): PROBNP in the last 8760 hours. HbA1C: No results for input(s): HGBA1C in the last 72 hours. CBG: Recent Labs  Lab 05/17/19 1501  GLUCAP 86   Lipid Profile: No results for input(s): CHOL, HDL, LDLCALC, TRIG, CHOLHDL, LDLDIRECT in the last 72 hours. Thyroid Function Tests: Recent Labs    05/22/19 1452  TSH 0.659   Anemia Panel: No results for input(s): VITAMINB12, FOLATE, FERRITIN, TIBC, IRON, RETICCTPCT in the last 72 hours. Sepsis Labs: No results for input(s): PROCALCITON, LATICACIDVEN in the last 168 hours.  Recent Results (from the past 240 hour(s))  SARS Coronavirus 2 [facility](Hospital order, [facility]) Nasopharyngeal Nasopharyngeal Swab     Status: None   Collection Time: 05/17/19  6:18 PM   Specimen: Nasopharyngeal Swab  Result Value Ref Range Status   SARS Coronavirus 2 NEGATIVE NEGATIVE Final    Comment: (NOTE) If  result is NEGATIVE SARS-CoV-2 target nucleic acids are NOT DETECTED. The SARS-CoV-2 RNA is generally detectable in upper and lower  respiratory specimens during the acute phase of infection. The lowest  concentration of SARS-CoV-2 viral copies this assay can detect is 250  copies / mL. A negative result does not preclude SARS-CoV-2 infection  and should not be used as the sole basis for treatment or other  patient management decisions.  A negative result may occur with  improper specimen collection / handling, submission of specimen other  than nasopharyngeal swab, presence of viral mutation(s) within the  areas targeted by this assay, and inadequate number of viral copies  (<250 copies / mL). A  negative result must be combined with clinical  observations, patient history, and epidemiological information. If result is POSITIVE SARS-CoV-2 target nucleic acids are DETECTED. The SARS-CoV-2 RNA is generally detectable in upper and lower  respiratory specimens dur ing the acute phase of infection.  Positive  results are indicative of active infection with SARS-CoV-2.  Clinical  correlation with patient history and other diagnostic information is  necessary to determine patient infection status.  Positive results do  not rule out bacterial infection or co-infection with other viruses. If result is PRESUMPTIVE POSTIVE SARS-CoV-2 nucleic acids MAY BE PRESENT.   A presumptive positive result was obtained on the submitted specimen  and confirmed on repeat testing.  While 2019 novel coronavirus  (SARS-CoV-2) nucleic acids may be present in the submitted sample  additional confirmatory testing may be necessary for epidemiological  and / or clinical management purposes  to differentiate between  SARS-CoV-2 and other Sarbecovirus currently known to infect humans.  If clinically indicated additional testing with an alternate test  methodology [phone]) is advised. The SARS-CoV-2 RNA is generally  detectable in upper and lower respiratory sp ecimens during the acute  phase of infection. The expected result is Negative. Fact Sheet for Patients:  BoilerBrush.com.cy Fact Sheet for Healthcare Providers: https://pope.com/ This test is not yet approved or cleared by the [place] FDA and has been authorized for detection and/or diagnosis of SARS-CoV-2 by FDA under an Emergency Use Authorization (EUA).  This EUA will remain in effect (meaning this test can be used) for the duration of the COVID-19 declaration under Section 564(b)(1) of the Act, 21 U.S.C. section 360bbb-3(b)(1), unless the authorization is terminated or revoked sooner. Performed  at [facility], [address], [place], [place] [address]   Surgical PCR screen     Status: None   Collection Time: 05/22/19  6:59 PM   Specimen: Nasal Mucosa; Nasal Swab  Result Value Ref Range Status   MRSA, PCR NEGATIVE NEGATIVE Final   Staphylococcus aureus NEGATIVE NEGATIVE Final    Comment: (NOTE) The Xpert SA Assay (FDA approved for NASAL specimens in patients 23 years of age and older), is one component of a comprehensive surveillance program. It is not intended to diagnose infection nor to guide or monitor treatment. Performed at [facility], [address], [place], [place] [address]          Radiology Studies: No results found.      Scheduled Meds: . aspirin EC  81 mg Oral Daily  . fludrocortisone  0.2 mg Oral Daily  . gabapentin  300 mg Oral TID  . gentamicin irrigation  80 mg Irrigation To Cath  . levETIRAcetam  500 mg Oral BID  . pantoprazole  40 mg Oral BID  . polyethylene glycol  17 g Oral Daily  .  QUEtiapine  25 mg Oral QHS  . rosuvastatin  5 mg Oral Daily  . sodium chloride flush  3 mL Intravenous Q12H   Continuous Infusions: . sodium chloride 50 mL/hr at 05/23/19 0404  . sodium chloride 250 mL (05/23/19 0008)  . vancomycin       LOS: 5 days    Time spent: 25 mins.More than 50% of that time was spent in counseling and/or coordination of care.      Burnadette PopAmrit Denman Pichardo, MD [facility] Pager [phone]  If 7PM-7AM, please contact night-coverage [email or web address] Password [email or web address] 05/23/2019, 11:28 AM

## 2019-05-23 NOTE — Discharge Instructions (Signed)
Florinef has been resumed.  Neurology recommends that you stop trazodone and start taking Keppra twice a day. Please follow-up with neurology as outpatient in 3 to 4 weeks.   You were cared for by a hospitalist during your hospital stay. If you have any questions about your discharge medications or the care you received while you were in the hospital after you are discharged, you can call the unit and asked to speak with the hospitalist on call if the hospitalist that took care of you is not available. Once you are discharged, your primary care physician will handle any further medical issues.   Please note that NO REFILLS for any discharge medications will be authorized once you are discharged, as it is imperative that you return to your primary care physician (or establish a relationship with a primary care physician if you do not have one) for your aftercare needs so that they can reassess your need for medications and monitor your lab values.  Please take all your medications with you for your next visit with your Primary MD. Please ask your Primary MD to get all Hospital records sent to his/her office. Please request your Primary MD to go over all hospital test results at the follow up.   If you experience worsening of your admission symptoms, develop shortness of breath, chest pain, suicidal or homicidal thoughts or a life threatening emergency, you must seek medical attention immediately by calling 911 or calling your MD.   Bonita Quin must read the complete instructions/literature along with all the possible adverse reactions/side effects for all the medicines you take including new medications that have been prescribed to you. Take new medicines after you have completely understood and accpet all the possible adverse reactions/side effects.    Do not drive when taking pain medications or sedatives.     Do not take more than prescribed Pain, Sleep and Anxiety Medications   If you have smoked or  chewed Tobacco in the last 2 yrs please stop. Stop any regular alcohol  and or recreational drug use.   Wear Seat belts while driving.      Supplemental Discharge Instructions for  Pacemaker/Defibrillator Patients  Activity No heavy lifting or vigorous activity with your left/right arm for 6 to 8 weeks.  Do not raise your left/right arm above your head for one week.  Gradually raise your affected arm as drawn below.              05/26/2019                05/27/2019                05/28/2019              05/29/2019 __  NO DRIVING until cleared to by your PMD,  neurologist   WOUND CARE - Keep the wound area clean and dry.  Do not get this area wet, no showers until cleared to at your wound check visit . - The tape/steri-strips on your wound will fall off; do not pull them off.  No bandage is needed on the site.  DO  NOT apply any creams, oils, or ointments to the wound area. - If you notice any drainage or discharge from the wound, any swelling or bruising at the site, or you develop a fever > 101? F after you are discharged home, call the office at once.  Special Instructions - You are still able to use cellular telephones; use the ear  opposite the side where you have your pacemaker/defibrillator.  Avoid carrying your cellular phone near your device. - When traveling through airports, show security personnel your identification card to avoid being screened in the metal detectors.  Ask the security personnel to use the hand wand. - Avoid arc welding equipment, MRI testing (magnetic resonance imaging), TENS units (transcutaneous nerve stimulators).  Call the office for questions about other devices. - Avoid electrical appliances that are in poor condition or are not properly grounded. - Microwave ovens are safe to be near or to operate.

## 2019-05-24 ENCOUNTER — Inpatient Hospital Stay (HOSPITAL_COMMUNITY): Payer: Medicare Other

## 2019-05-24 MED ORDER — OXYCODONE-ACETAMINOPHEN 5-325 MG PO TABS: 1.0000 | ORAL_TABLET | ORAL | 0 refills | Status: DC | PRN

## 2019-05-24 NOTE — Progress Notes (Signed)
Progress Note  Patient Name: Denise Espinoza Date of Encounter: 05/24/2019  Primary Cardiologist: Raechel Chute, MD   Subjective   No chest pain or sob. Minimal incisional pain  Inpatient Medications    Scheduled Meds: . aspirin EC  81 mg Oral Daily  . fludrocortisone  0.2 mg Oral Daily  . gabapentin  300 mg Oral TID  . levETIRAcetam  500 mg Oral BID  . pantoprazole  40 mg Oral BID  . polyethylene glycol  17 g Oral Daily  . QUEtiapine  25 mg Oral QHS  . rosuvastatin  5 mg Oral Daily  . sodium chloride flush  3 mL Intravenous Q12H   Continuous Infusions:  PRN Meds: acetaminophen **OR** acetaminophen (TYLENOL) oral liquid 160 mg/5 mL **OR** acetaminophen, ondansetron (ZOFRAN) IV, senna-docusate   Vital Signs    Vitals:   05/23/19 2334 05/23/19 2350 05/24/19 0357 05/24/19 0741  BP: (!) 116/56  (!) 110/56 138/66  Pulse: 70 69 66 67  Resp: 14 16 15 16   Temp: 98 F (36.7 C)  98.1 F (36.7 C) 97.7 F (36.5 C)  TempSrc: Axillary  Axillary Oral  SpO2:   96% 94%  Weight:      Height:        Intake/Output Summary (Last 24 hours) at 05/24/2019 1101 Last data filed at 05/24/2019 0500 Gross per 24 hour  Intake 817.1 ml  Output 700 ml  Net 117.1 ml   Filed Weights   05/17/19 1303 05/17/19 2250  Weight: 66.7 kg 64 kg    Telemetry    nsr with ventricular pacing - Personally Reviewed  ECG    NSR with ventricular pacing - Personally Reviewed  Physical Exam   GEN: No acute distress.  Residual right face ecchymosis Neck: No JVD Cardiac: RRR, no murmurs, rubs, or gallops.  Respiratory: Clear to auscultation bilaterally. Well healed PPM incision GI: Soft, nontender, non-distended  MS: No edema; No deformity. Neuro:  Nonfocal  Psych: Normal affect   Labs    Chemistry Recent Labs  Lab 05/17/19 1533 05/18/19 0906 05/19/19 0619  NA 141 140 143  K 3.9 3.6 3.8  CL 108 105 109  CO2 23 24 25   GLUCOSE 91 81 97  BUN 11 12 16   CREATININE 0.55 0.64 0.65  CALCIUM  9.2 9.2 8.9  PROT 7.3  --   --   ALBUMIN 4.0  --   --   AST 20  --   --   ALT 10  --   --   ALKPHOS 91  --   --   BILITOT 0.9  --   --   GFRNONAA >60 >60 >60  GFRAA >60 >60 >60  ANIONGAP 10 11 9      Hematology Recent Labs  Lab 05/17/19 1302 05/17/19 1531 05/18/19 0906  WBC 4.6  --  3.7*  RBC 4.13  --  4.06  HGB 11.9* 12.2 11.9*  HCT 38.0 36.0 37.2  MCV 92.0  --  91.6  MCH 28.8  --  29.3  MCHC 31.3  --  32.0  RDW 14.1  --  13.9  PLT 190  --  189    Cardiac EnzymesNo results for input(s): TROPONINI in the last 168 hours. No results for input(s): TROPIPOC in the last 168 hours.   BNPNo results for input(s): BNP, PROBNP in the last 168 hours.   DDimer No results for input(s): DDIMER in the last 168 hours.   Radiology    Dg Chest 2 View  Result Date: 05/24/2019 CLINICAL DATA:  Postop from pacemaker placement. EXAM: CHEST - 2 VIEW COMPARISON:  05/17/2019 FINDINGS: New dual lead transvenous pacemaker seen with leads overlying the right atrium and right ventricle. No evidence of pneumothorax or pleural effusion. Both lungs are clear. Heart size is normal. IMPRESSION: New transvenous pacemaker in appropriate position. No evidence of pneumothorax or other acute findings. Electronically Signed   By: Danae Orleans M.D.   On: 05/24/2019 09:55    Cardiac Studies   none  Patient Profile     85 y.o. female admitted with falls and found to have marked bradycardia due to sinus node dysfunction, s/p PPM insertion  Assessment & Plan    1. Sinus node dysfunction - she is s/p PPM insertion. 2. PPM - her device has been interrogated and CXR obtained demonstrating normal PPM function and device position. 3. Disp. - she can be discharged home from EP perspective. Usual followup. Of note she lives in [place] and would like to be seen in our [place] office in the future.   Skarlet Lyons,M.D. [facility] will sign off.   Medication Recommendations:  Continue current Other  recommendations (labs, testing, etc):  none Follow up as an outpatient:  2 weeks in [place] for a PPM incision check and 3 months with me in [place] for PPM followup.   For questions or updates, please contact [facility] Please consult [email or web address] for contact info under Cardiology/STEMI.   Signed, Lewayne Bunting, MD  05/24/2019, 11:01 AM  Patient ID: Denise Espinoza, female   DOB: 12/15/1929, 85 y.o.   MRN: [id number]

## 2019-05-24 NOTE — Progress Notes (Signed)
Physical Therapy Treatment Patient Details Name: Denise Espinoza MRN: [id number] DOB: 07/07/1930 Today's Date: 05/24/2019    History of Present Illness This 85 y.o. female admitted with transient spells of impaired speech, confusion, and lethargy.  MRI of brain was negative for acute infarct.  Current diagnosis; encephalopathy with confusion/and lethargy consistent with post ictal state after transient neurological spell.  Work up underway.  PMH: SVT, cervical cord injury s/p cervical fusion. Pt underwent pacemaker placement 05/23/19.    PT Comments    Pt seen for PT following pacemaker placement yesterday. She required min assist bed mobility, min guard assist transfers and min guard assist ambulation 50 feet with RW. Pt with c/o mild dizziness initially after sitting up. Dizziness improved to allow for ambulation. Pt positioned in recliner with feet elevated at end of session. Pt educated on pacemaker precautions. Sling removed for ambulation due to need for RW. Sling replaced upon sitting in recliner.    Follow Up Recommendations  Home health PT;Supervision/Assistance - 24 hour     Equipment Recommendations  None recommended by PT    Recommendations for Other Services       Precautions / Restrictions Precautions Precautions: Fall;ICD/Pacemaker Required Braces or Orthoses: Sling    Mobility  Bed Mobility Overal bed mobility: Needs Assistance Bed Mobility: Supine to Sit     Supine to sit: Min assist     General bed mobility comments: increased time. Assist to prevent pushing/pulling with LUE.  Transfers Overall transfer level: Needs assistance Equipment used: Rolling walker (2 wheeled) Transfers: Sit to/from Stand Sit to Stand: Min guard Stand pivot transfers: Min guard       General transfer comment: min guard for safety  Ambulation/Gait Ambulation/Gait assistance: Min guard Gait Distance (Feet): 50 Feet Assistive device: Rolling walker (2 wheeled) Gait  Pattern/deviations: Step-through pattern;Decreased stride length;Trunk flexed Gait velocity: decreased Gait velocity interpretation: <1.31 ft/sec, indicative of household ambulator General Gait Details: cues to limted push/pull LUE on RW due to pacemaker. Distance limited due to pt awaiting arrival of breakfast.   Stairs             Wheelchair Mobility    Modified Rankin (Stroke Patients Only)       Balance Overall balance assessment: Needs assistance Sitting-balance support: Feet supported;No upper extremity supported Sitting balance-Leahy Scale: Fair     Standing balance support: Bilateral upper extremity supported;During functional activity Standing balance-Leahy Scale: Poor Standing balance comment: requires UE support                             Cognition Arousal/Alertness: Awake/alert Behavior During Therapy: WFL for tasks assessed/performed Overall Cognitive Status: No family/caregiver present to determine baseline cognitive functioning                                 General Comments: Appropriate and cooperative      Exercises      General Comments General comments (skin integrity, edema, etc.): sling in place up in recliner      Pertinent Vitals/Pain Pain Assessment: Faces Faces Pain Scale: Hurts even more Pain Location: L shoulder Pain Descriptors / Indicators: Discomfort;Grimacing;Guarding Pain Intervention(s): Monitored during session;Repositioned;Limited activity within patient's tolerance    Home Living                      Prior Function  PT Goals (current goals can now be found in the care plan section) Acute Rehab PT Goals Patient Stated Goal: to return home PT Goal Formulation: With patient Potential to Achieve Goals: Good Progress towards PT goals: Progressing toward goals    Frequency    Min 4X/week      PT Plan Current plan remains appropriate    Co-evaluation               AM-PAC PT "6 Clicks" Mobility   Outcome Measure  Help needed turning from your back to your side while in a flat bed without using bedrails?: A Little Help needed moving from lying on your back to sitting on the side of a flat bed without using bedrails?: A Little Help needed moving to and from a bed to a chair (including a wheelchair)?: A Little Help needed standing up from a chair using your arms (e.g., wheelchair or bedside chair)?: A Little Help needed to walk in hospital room?: A Little Help needed climbing 3-5 steps with a railing? : A Lot 6 Click Score: 17    End of Session Equipment Utilized During Treatment: Gait belt Activity Tolerance: Patient tolerated treatment well Patient left: in chair;with call bell/phone within reach;with chair alarm set Nurse Communication: Mobility status PT Visit Diagnosis: Unsteadiness on feet (R26.81)     Time: 8466-5993 PT Time Calculation (min) (ACUTE ONLY): 18 min  Charges:  $Gait Training: 8-22 mins                     Denise Espinoza, PT  Office # [phone] Pager [phone]    Denise Espinoza 05/24/2019, 9:48 AM

## 2019-05-24 NOTE — Care Management (Signed)
Notified [facility] that patient will DC today. No other CM needs identified.

## 2019-05-24 NOTE — Progress Notes (Signed)
PROGRESS NOTE    Denise Espinoza  FTD:[id number] DOB: 08-19-1930 DOA: 05/17/2019 PCP: Tacy Learn, FNP   Brief Narrative:  Patient is 85 year old female with history of cervical spine stenosis with myelopathy, central cord syndrome, SVT was brought to the emergency department with complaints of word finding difficulty, dysarthria.  Patient was evaluated by neurology after admission.  MRI did not show any stroke.  Suspected to have seizures and started on Keppra.  Hospital course remarkable for multiple episodes of significant bradycardia/pauses causing syncope.  EP was consulted.  Discontinued beta-blocker. Underwent  pacemaker placement on 05/25/19.  Assessment & Plan:   Principal Problem:   Word finding difficulty Active Problems:   Stenosis of cervical spine with myelopathy (HCC)   Central cord syndrome (HCC)   Orthostatic hypotension   History of supraventricular tachycardia   H/O Cervical  laminectomy   Supraventricular tachycardia (HCC)   GERD without esophagitis   Chronic constipation   Syncope   Seizures (HCC)   Slurred speech   Bradycardia/syncope/pause:Had multiple pauses as captured by tele. Beta-blocker discontinued .EP following and underwent pacemaker placement .  Dysarthria:Presented with word finding difficulty, inability to finish sentences. Neurology was following. Head and cervical CT were unremarkable. MRI was negative for acute ischemic stroke and shows chronic ischemic microangiopathy. MRAis unremarkable. 2D echo did not show any significant abnormalities. EEG did not reveal any epileptiform discharges. Neurology recommended to start on Keppra 500 mg twice a day. Trazodone discontinued. Recommended to follow-up with neurology in 3 to 4 weeks as an outpatient.  Fall: Reported falling at home. PT/OT consulted and recommended home health physical therapy/Occupational Therapy.  Dizziness/orthostatic hypotension: Has history of orthostatic  hypotension. Currently on Florinef. Metoprolol discontinued. Increase the dose of Florinef to 0.2 mg daily. Plan is to keep her blood pressure slightly elevated to prevent orthostatic hypotension.  Ribfracture: Denies any pain. Secondary to fall. Chest x-ray revealed subacute mildly displaced fracture involving the eighth rib on the left. Continue supportive care  History of cervical spine stenosis with myelopathy/central cord syndrome status post surgery Displaced type II odontoid fracture requiring C1 laminectomy, C2 ORIF and occipital to C4 PLIF on 02/06/2019. Patient subsequently required removal of C2 translaminar screw on 02/11/2019 per Dr. Conchita Paris  Hyperlipidemia: Continue Crestor  GERD: Continue PPI        DVT prophylaxis: Lovenox Code Status: partial Family Communication: called granddaughter for update Disposition Plan: Home today after EP evaluation   Consultants: Cardiology, neurology  Procedures: EEG, echocardiogram, MRI  Antimicrobials:  Anti-infectives (From admission, onward)   Start     Dose/Rate Route Frequency Ordered Stop   05/24/19 0200  vancomycin (VANCOCIN) IVPB 1000 mg/200 mL premix     1,000 mg 200 mL/hr over 60 Minutes Intravenous Every 12 hours 05/23/19 1555 05/24/19 0812   05/23/19 0600  gentamicin (GARAMYCIN) 80 mg in sodium chloride 0.9 % 500 mL irrigation     80 mg Irrigation To Cath Lab 05/22/19 1832 05/23/19 1511   05/23/19 0600  vancomycin (VANCOCIN) IVPB 1000 mg/200 mL premix     1,000 mg 200 mL/hr over 60 Minutes Intravenous To Cath Lab 05/22/19 1832 05/23/19 1624   05/17/19 1845  ciprofloxacin (CIPRO) tablet 500 mg  Status:  Discontinued     500 mg Oral  Once 05/17/19 1844 05/17/19 1845   05/17/19 1845  metroNIDAZOLE (FLAGYL) tablet 500 mg  Status:  Discontinued     500 mg Oral  Once 05/17/19 1844 05/17/19 1845      Subjective:  Patient  seen and examined the bedside this morning.  Hemodynamically stable.  Complains of pain  on the pacemaker site and difficulty moving her left upper extremity.  She will be discharged today after being seen and cleared by EP.  Objective: Vitals:   05/23/19 2334 05/23/19 2350 05/24/19 0357 05/24/19 0741  BP: (!) 116/56  (!) 110/56 138/66  Pulse: 70 69 66 67  Resp: 14 16 15 16   Temp: 98 F (36.7 C)  98.1 F (36.7 C) 97.7 F (36.5 C)  TempSrc: Axillary  Axillary Oral  SpO2:   96% 94%  Weight:      Height:        Intake/Output Summary (Last 24 hours) at 05/24/2019 0945 Last data filed at 05/24/2019 0500 Gross per 24 hour  Intake 817.1 ml  Output 700 ml  Net 117.1 ml   Filed Weights   05/17/19 1303 05/17/19 2250  Weight: 66.7 kg 64 kg    Examination:  General exam: Appears calm and comfortable ,Not in distress,average built, pleasant elderly female HEENT:PERRL,Oral mucosa moist, Ear/Nose normal on gross exam, bruise on the right face Respiratory system: Bilateral equal air entry, normal vesicular breath sounds, no wheezes or crackles  Cardiovascular system: S1 & S2 heard, RRR. No JVD, murmurs, rubs, gallops or clicks. No pedal edema.Pacemaker covered with dressing Gastrointestinal system: Abdomen is nondistended, soft and nontender. No organomegaly or masses felt. Normal bowel sounds heard. Central nervous system: Alert and oriented. No focal neurological deficits. Extremities: No edema, no clubbing ,no cyanosis, distal peripheral pulses palpable. Skin: No rashes, lesions or ulcers,no icterus ,no pallor    Data Reviewed: I have personally reviewed following labs and imaging studies  CBC: Recent Labs  Lab 05/17/19 1302 05/17/19 1531 05/18/19 0906  WBC 4.6  --  3.7*  NEUTROABS 3.2  --  2.2  HGB 11.9* 12.2 11.9*  HCT 38.0 36.0 37.2  MCV 92.0  --  91.6  PLT 190  --  189   Basic Metabolic Panel: Recent Labs  Lab 05/17/19 1531 05/17/19 1533 05/18/19 0906 05/19/19 0619  NA 141 141 140 143  K 4.5 3.9 3.6 3.8  CL 106 108 105 109  CO2  --  23 24 25    GLUCOSE 85 91 81 97  BUN 11 11 12 16   CREATININE 0.50 0.55 0.64 0.65  CALCIUM  --  9.2 9.2 8.9   GFR: Estimated Creatinine Clearance: 44.6 mL/min (by C-G formula based on SCr of 0.65 mg/dL). Liver Function Tests: Recent Labs  Lab 05/17/19 1533  AST 20  ALT 10  ALKPHOS 91  BILITOT 0.9  PROT 7.3  ALBUMIN 4.0   No results for input(s): LIPASE, AMYLASE in the last 168 hours. No results for input(s): AMMONIA in the last 168 hours. Coagulation Profile: Recent Labs  Lab 05/17/19 1538  INR 1.1   Cardiac Enzymes: No results for input(s): CKTOTAL, CKMB, CKMBINDEX, TROPONINI in the last 168 hours. BNP (last 3 results) No results for input(s): PROBNP in the last 8760 hours. HbA1C: No results for input(s): HGBA1C in the last 72 hours. CBG: Recent Labs  Lab 05/17/19 1501  GLUCAP 86   Lipid Profile: No results for input(s): CHOL, HDL, LDLCALC, TRIG, CHOLHDL, LDLDIRECT in the last 72 hours. Thyroid Function Tests: Recent Labs    05/22/19 1452  TSH 0.659   Anemia Panel: No results for input(s): VITAMINB12, FOLATE, FERRITIN, TIBC, IRON, RETICCTPCT in the last 72 hours. Sepsis Labs: No results for input(s): PROCALCITON, LATICACIDVEN in the last 168  hours.  Recent Results (from the past 240 hour(s))  SARS Coronavirus 2 [facility] order, Performed in [facility] hospital lab) Nasopharyngeal Nasopharyngeal Swab     Status: None   Collection Time: 05/17/19  6:18 PM   Specimen: Nasopharyngeal Swab  Result Value Ref Range Status   SARS Coronavirus 2 NEGATIVE NEGATIVE Final    Comment: (NOTE) If result is NEGATIVE SARS-CoV-2 target nucleic acids are NOT DETECTED. The SARS-CoV-2 RNA is generally detectable in upper and lower  respiratory specimens during the acute phase of infection. The lowest  concentration of SARS-CoV-2 viral copies this assay can detect is 250  copies / mL. A negative result does not preclude SARS-CoV-2 infection  and should not be used as the sole basis for  treatment or other  patient management decisions.  A negative result may occur with  improper specimen collection / handling, submission of specimen other  than nasopharyngeal swab, presence of viral mutation(s) within the  areas targeted by this assay, and inadequate number of viral copies  (<250 copies / mL). A negative result must be combined with clinical  observations, patient history, and epidemiological information. If result is POSITIVE SARS-CoV-2 target nucleic acids are DETECTED. The SARS-CoV-2 RNA is generally detectable in upper and lower  respiratory specimens dur ing the acute phase of infection.  Positive  results are indicative of active infection with SARS-CoV-2.  Clinical  correlation with patient history and other diagnostic information is  necessary to determine patient infection status.  Positive results do  not rule out bacterial infection or co-infection with other viruses. If result is PRESUMPTIVE POSTIVE SARS-CoV-2 nucleic acids MAY BE PRESENT.   A presumptive positive result was obtained on the submitted specimen  and confirmed on repeat testing.  While 2019 novel coronavirus  (SARS-CoV-2) nucleic acids may be present in the submitted sample  additional confirmatory testing may be necessary for epidemiological  and / or clinical management purposes  to differentiate between  SARS-CoV-2 and other Sarbecovirus currently known to infect humans.  If clinically indicated additional testing with an alternate test  methodology [phone]) is advised. The SARS-CoV-2 RNA is generally  detectable in upper and lower respiratory sp ecimens during the acute  phase of infection. The expected result is Negative. Fact Sheet for Patients:  StrictlyIdeas.no Fact Sheet for Healthcare Providers: BankingDealers.co.za This test is not yet approved or cleared by the [place] FDA and has been authorized for detection and/or  diagnosis of SARS-CoV-2 by FDA under an Emergency Use Authorization (EUA).  This EUA will remain in effect (meaning this test can be used) for the duration of the COVID-19 declaration under Section 564(b)(1) of the Act, 21 U.S.C. section 360bbb-3(b)(1), unless the authorization is terminated or revoked sooner. Performed at [facility], [address] [place] [address], [place], [place] [address]   Surgical PCR screen     Status: None   Collection Time: 05/22/19  6:59 PM   Specimen: Nasal Mucosa; Nasal Swab  Result Value Ref Range Status   MRSA, PCR NEGATIVE NEGATIVE Final   Staphylococcus aureus NEGATIVE NEGATIVE Final    Comment: (NOTE) The Xpert SA Assay (FDA approved for NASAL specimens in patients 28 years of age and older), is one component of a comprehensive surveillance program. It is not intended to diagnose infection nor to guide or monitor treatment. Performed at [facility], [place] [address], [place], [place] [address]          Radiology Studies: No results found.  Scheduled Meds: . aspirin EC  81 mg Oral Daily  . fludrocortisone  0.2 mg Oral Daily  . gabapentin  300 mg Oral TID  . levETIRAcetam  500 mg Oral BID  . pantoprazole  40 mg Oral BID  . polyethylene glycol  17 g Oral Daily  . QUEtiapine  25 mg Oral QHS  . rosuvastatin  5 mg Oral Daily  . sodium chloride flush  3 mL Intravenous Q12H   Continuous Infusions:    LOS: 6 days    Time spent: 25 mins.More than 50% of that time was spent in counseling and/or coordination of care.      Burnadette PopAmrit Aalaya Yadao, MD [facility] Pager [phone]  If 7PM-7AM, please contact night-coverage [email or web address] Password [email or web address] 05/24/2019, 9:45 AM

## 2019-05-26 ENCOUNTER — Encounter (HOSPITAL_COMMUNITY): Payer: Self-pay | Admitting: Cardiology

## 2019-05-26 MED FILL — Lidocaine HCl Local Inj 1%: INTRAMUSCULAR | Qty: 60 | Status: AC

## 2019-06-04 NOTE — Progress Notes (Signed)
Cardiology Office Note Date:  06/05/2019  Patient ID:  Robley FriesOpal Espinoza, DOB 1929-10-31, MRN [id number] PCP:  Denise Espinoza, Kristen, FNP  Cardiologist:  Dr. Rennis Espinoza (new during hospital stay) Electrophysiologist: Dr. Elberta Espinoza  >>> Dr. Ladona [place]ino[place] in [place] going forward is much closer for her    Chief Complaint:  s/p PPM implant  History of Present Illness: Denise Espinoza is a 85 y.o. female with history of with a hx of C-spine stenosis w/myelopathy, centra cord syndrome, OA, ?SVT, recent developement as noted below of significant orthostatic hypotension (improved with florinef), and sick sinus syndrome w/PPM.  Denise Espinoza has a somewhat complicated and unfortunate recent history  admitted 4/28-02/15/2023 displaced type II dens fracture with ORIF on 4/30. ---She was discharged to CIR on 5/9 for significant functional deficits. Follow-up CT showed C2 laminar screw intruding into the posterior spinal canal and right lateral recess. It was removed on 5/5 but she continued to have declining worsening of numbness. ---She was admitted to rehab 5/9-03/12/2019 for central cord syndrome. She also had a UTI and anemia. She was noted to have orthostatic hypotension and Florinef was started during this admission. ---After discharge from rehab on 7/11, she was seen by Dr. Hermelinda Espinoza on 7/29. At that time, there was concern about lower extremity edema. This was felt to be dependent edema but it was noted that gabapentin and Florinef could contribute to this so the Florinef was stopped and gabapentin was tapered. She was noted to have a history of SVT and was having problems with presyncope. She was to continue on metoprolol daily. She was having sudden "dropouts", cause unclear,as they would start while she was seated. Cardiology referral made.  -- on 8/8, patient was admitted for syncopal episode. Cardiology asked to evaluate.  Her Toprol was stopped  When she was admitted, orthostatic vital signs were  only mildly positive. -The Florinef was held, but by 8/10, her orthostatic vital signs were markedly positive. The Florinef was restarted --8/11while Dr. Rennis Espinoza was with the patient be became unresponsive, the RN entered the room reporting associated pause on  Telemetry of over 4 seconds seconds.  EP is asked to the case. Tele noted pauses as long as 6 seconds. Her toprol allowed to wash out, and with this her SB and pauses seemed to resolved. A few days later unfortunately she had recurrent pauses and without reversible causes PPM implanted 05/23/2019 and she discharged 8/15.  She comes in accompanied y her granddaughter who she lives with. They both reports no positional symptoms at all since home.  When she 1st gets up in the AM she gives her self plenty of time seated first with minimal symptoms if any upon standing, then for the most part through the day does very well.  She denies any CP, palpitations, no SOB or DOE< though has not been very physically active.  She denies any pain at her PPM site  Her granddaughter states that the med list said to take 2 pills of florinef daily but the pill bottle said only one pill, so she has been giving her only one pill of the florinef once daily.  Device information MDT dual chamber PPM implanted 05/23/2019, Dr. Elberta Espinoza, sinus node dyssfunction   Past Medical History:  Diagnosis Date   Allergic rhinitis    Anosmia    Arthritis    Full dentures    History of diverticulitis    Insomnia    Senile osteoporosis    SOB (shortness of breath)    SVT (  supraventricular tachycardia) (HCC)    Varicose vein of leg    left   Wears glasses     Past Surgical History:  Procedure Laterality Date   COLON RESECTION  1989   diverticulitis   COLONOSCOPY     ESOPHAGEAL DILATION  2015   endoscopy   PACEMAKER IMPLANT N/A 05/23/2019   Procedure: PACEMAKER IMPLANT;  Surgeon: Denise Espinoza, Denise Martin, MD;  Location: [facility];  Service:  Cardiovascular;  Laterality: N/A;   POSTERIOR CERVICAL FUSION/FORAMINOTOMY N/A 02/11/2019   Procedure: POSTERIOR CERVICAL INSTRUMENTATION REMOVAL CERVICAL TWO;  Surgeon: Lisbeth Espinoza, Neelesh, MD;  Location: [facility];  Service: Neurosurgery;  Laterality: N/A;   POSTERIOR CERVICAL LAMINECTOMY N/A 02/06/2019   Procedure: Cervical one LAMINECTOMY, Occiput-Cervical four fusion;  Surgeon: Lisbeth Espinoza, Neelesh, MD;  Location: [facility];  Service: Neurosurgery;  Laterality: N/A;    Current Outpatient Medications  Medication Sig Dispense Refill   acetaminophen (TYLENOL) 325 MG tablet Take 1-2 tablets (325-650 mg total) by mouth every 4 (four) hours as needed for mild pain.     calcium-vitamin D (OSCAL WITH D) 500-200 MG-UNIT per tablet Take 1 tablet by mouth 2 (two) times daily.      Dextran 70-Hypromellose (ARTIFICIAL TEARS) 0.1-0.3 % SOLN Place 1 drop into both eyes 3 (three) times daily as needed.     diclofenac sodium (VOLTAREN) 1 % GEL Apply 2 g topically 4 (four) times daily. 350 g 0   fludrocortisone (FLORINEF) 0.1 MG tablet Take 1 tablet (0.1 mg total) by mouth daily. 90 tablet 3   gabapentin (NEURONTIN) 300 MG capsule Take 1 capsule (300 mg total) by mouth 3 (three) times daily. 90 capsule 0   levETIRAcetam (KEPPRA) 500 MG tablet Take 1 tablet (500 mg total) by mouth 2 (two) times daily. 60 tablet 0   NON FORMULARY Diet Type: Regular     Nutritional Supplements (ENSURE ENLIVE PO) Take 1 Bottle by mouth daily. Prefers Vanilla     oxyCODONE-acetaminophen (PERCOCET) 5-325 MG tablet Take 1 tablet by mouth every 4 (four) hours as needed for severe pain. 15 tablet 0   pantoprazole (PROTONIX) 40 MG tablet Take 1 tablet (40 mg total) by mouth 2 (two) times daily. 60 tablet 0   polyethylene glycol (MIRALAX / GLYCOLAX) 17 g packet Take 17 g by mouth daily.     rosuvastatin (CRESTOR) 5 MG tablet Take 1 tablet (5 mg total) by mouth daily. 30 tablet 0   sennosides-docusate sodium (SENOKOT-S) 8.6-50 MG  tablet Take 2 tablets by mouth 2 (two) times daily as needed for constipation.     No current facility-administered medications for this visit.     Allergies:   Penicillins and Codeine phosphate [codeine]   Social History:  The patient  reports that she has never smoked. She has never used smokeless tobacco. She reports current alcohol use. She reports that she does not use drugs.   Family History:  The patient's family history includes Cancer in her mother; Emphysema in her brother; High blood pressure in her father.  ROS:  Please see the history of present illness.  All other systems are reviewed and otherwise negative.   PHYSICAL EXAM:  VS:  BP (!) 177/94    Pulse 96    Ht 5\' 3"  (1.6 m)    Wt 143 lb (64.9 kg)    BMI 25.33 kg/m  BMI: Body mass index is 25.33 kg/m. Well nourished, well developed, in no acute distress  HEENT: normocephalic, atraumatic  Neck: no JVD,  carotid bruits or masses Cardiac:  RRR; no significant murmurs, no rubs, or gallops Lungs:  CTA b/l, no wheezing, rhonchi or rales  Abd: soft, nontender MS: no deformity, age appropriate atrophy Ext: no edema  Skin: warm and dry, no rash Neuro:  No gross deficits appreciated Psych: euthymic mood, full affect  PPM site: she comes wearing her sling!  This was removed, her steri strips were in place and removed without difficulty.  The skin edges are well healed, no erythema, edema of fluctuation, no increased heat to the surrounding tissues.   EKG:  Not done today PPM interrogation done today and reviewed by myself:  Battery and lead measurements are good 0.1% VP 9.3 % AP No arrhythmias, acute outputs remain programmed   05/18/2019: TTE IMPRESSIONS 1. The left ventricle has normal systolic function, with an ejection fraction of 55-60%. The cavity size was normal. There is moderately increased left ventricular wall thickness. Left ventricular diastolic parameters were normal. 2. The right ventricle has normal  systolic function. The cavity was normal. There is no increase in right ventricular wall thickness. 3. Moderate thickening of the mitral valve leaflet. Mild calcification of the mitral valve leaflet. 4. Tricuspid valve regurgitation is mild-moderate. 5. The aortic valve is tricuspid. Moderate thickening of the aortic valve. Sclerosis without any evidence of stenosis of the aortic valve. Aortic valve regurgitation is mild by color flow Doppler. 6. The aorta is normal in size and structure.  FINDINGS Left Ventricle: The left ventricle has normal systolic function, with an ejection fraction of 55-60%. The cavity size was normal. There is moderately increased left ventricular wall thickness. Left ventricular diastolic parameters were normal.  Right Ventricle: The right ventricle has normal systolic function. The cavity was normal. There is no increase in right ventricular wall thickness.  Left Atrium: Left atrial size was normal in size.  Right Atrium: Right atrial size was normal in size. Right atrial pressure is estimated at 10 mmHg.  Interatrial Septum: No atrial level shunt detected by color flow Doppler.  Pericardium: There is no evidence of pericardial effusion.  Mitral Valve: The mitral valve is normal in structure. Moderate thickening of the mitral valve leaflet. Mild calcification of the mitral valve leaflet. Mitral valve regurgitation is not visualized by color flow Doppler.  Tricuspid Valve: The tricuspid valve is normal in structure. Tricuspid valve regurgitation is mild-moderate by color flow Doppler.  Aortic Valve: The aortic valve is tricuspid Moderate thickening of the aortic valve. Sclerosis without any evidence of stenosis of the aortic valve. Aortic valve regurgitation is mild by color flow Doppler. There is no evidence of aortic valve stenosis.  Pulmonic Valve: The pulmonic valve was grossly normal. Pulmonic valve regurgitation is mild by color flow  Doppler.  Aorta: The aorta is normal in size and structure.    Recent Labs: 05/17/2019: ALT 10 05/18/2019: Hemoglobin 11.9; Platelets 189 05/19/2019: BUN 16; Creatinine, Ser 0.65; Potassium 3.8; Sodium 143 05/22/2019: TSH 0.659  05/18/2019: Cholesterol 148; HDL 58; LDL Cholesterol 76; Total CHOL/HDL Ratio 2.6; Triglycerides 70; VLDL 14   Estimated Creatinine Clearance: 44.9 mL/min (by C-G formula based on SCr of 0.65 mg/dL).   Wt Readings from Last 3 Encounters:  06/05/19 143 lb (64.9 kg)  05/17/19 141 lb (64 kg)  04/17/19 138 lb 6.4 oz (62.8 kg)     Other studies reviewed: Additional studies/records reviewed today include: summarized above  ASSESSMENT AND PLAN:  1. PPM     Site is well healed, no signs of infection  acute implant outputs remain, no programming changes were made  Revisit activity restrictions until 6 weeks, no more sling     2. Orthostatic syncope     No symptoms at home     Resting BP is high here today  Getting her to the bed not attempted Sitting BP 166/80, 80bpm Standing 136/70, 86bpm, no symptoms 1 minute 150/80, 86bpm, no symptoms 3 minutes 160/80, no symptoms  Prefer to allow HTN then recurrent syncope.  She still has marked BP dip upon stading.  No edema and no symptoms with the current florinef. I discussed briefly with Dr. Lovena Le, would allow the HTN  No changes today to her medicines.     Disposition: She lives in [place], [place], she Denise f/u with Dr. Lovena Le in [place] in Nov as scheduled, sooner if needed.  Q 3 month remotes  Current medicines are reviewed at length with the patient today.  The patient did not have any concerns regarding medicines.  Venetia Night, PA-C 06/05/2019 4:27 PM     [address] [place] [address] [phone] (office)  [phone] (fax)

## 2019-06-05 ENCOUNTER — Other Ambulatory Visit: Payer: Self-pay

## 2019-06-05 ENCOUNTER — Ambulatory Visit (INDEPENDENT_AMBULATORY_CARE_PROVIDER_SITE_OTHER): Payer: Medicare Other | Admitting: Physician Assistant

## 2019-06-05 ENCOUNTER — Encounter: Payer: Self-pay | Admitting: Physician Assistant

## 2019-06-05 VITALS — BP 177/94 | HR 96 | Ht 63.0 in | Wt 143.0 lb

## 2019-06-05 DIAGNOSIS — I471 Supraventricular tachycardia, unspecified: Secondary | ICD-10-CM

## 2019-06-05 DIAGNOSIS — Z950 Presence of cardiac pacemaker: Secondary | ICD-10-CM

## 2019-06-05 DIAGNOSIS — I951 Orthostatic hypotension: Secondary | ICD-10-CM

## 2019-06-05 MED ORDER — FLUDROCORTISONE ACETATE 0.1 MG PO TABS: 0.1000 mg | ORAL_TABLET | Freq: Every day | ORAL | 3 refills | Status: DC

## 2019-06-05 NOTE — Patient Instructions (Signed)
Medication Instructions:  Your physician recommends that you continue on your current medications as directed. Please refer to the Current Medication list given to you today.   If you need a refill on your cardiac medications before your next appointment, please call your pharmacy.   Lab work: NONE ORDERED  TODAY   If you have labs (blood work) drawn today and your tests are completely normal, you will receive your results only by: . MyChart Message (if you have MyChart) OR . A paper copy in the mail If you have any lab test that is abnormal or we need to change your treatment, we will call you to review the results.  Testing/Procedures: NONE ORDERED  TODAY  Follow-Up:   AS SCHEDULED   Any Other Special Instructions Will Be Listed Below (If Applicable).     

## 2019-06-06 ENCOUNTER — Other Ambulatory Visit: Payer: Self-pay | Admitting: Adult Health

## 2019-06-23 ENCOUNTER — Other Ambulatory Visit: Payer: Self-pay | Admitting: Adult Health

## 2019-07-07 ENCOUNTER — Other Ambulatory Visit: Payer: Self-pay | Admitting: Adult Health

## 2019-07-09 ENCOUNTER — Other Ambulatory Visit: Payer: Self-pay

## 2019-07-09 ENCOUNTER — Ambulatory Visit (INDEPENDENT_AMBULATORY_CARE_PROVIDER_SITE_OTHER): Payer: Medicare Other | Admitting: Diagnostic Neuroimaging

## 2019-07-09 ENCOUNTER — Encounter: Payer: Self-pay | Admitting: Diagnostic Neuroimaging

## 2019-07-09 ENCOUNTER — Encounter: Payer: Medicare Other | Attending: Physical Medicine & Rehabilitation | Admitting: Physical Medicine & Rehabilitation

## 2019-07-09 ENCOUNTER — Encounter: Payer: Self-pay | Admitting: Physical Medicine & Rehabilitation

## 2019-07-09 VITALS — BP 174/89 | HR 83 | Temp 97.7°F | Ht 63.0 in | Wt 142.0 lb

## 2019-07-09 VITALS — BP 144/82 | HR 84 | Temp 97.8°F | Ht 63.0 in | Wt 142.0 lb

## 2019-07-09 DIAGNOSIS — S14129S Central cord syndrome at unspecified level of cervical spinal cord, sequela: Secondary | ICD-10-CM | POA: Diagnosis present

## 2019-07-09 DIAGNOSIS — I471 Supraventricular tachycardia: Secondary | ICD-10-CM | POA: Diagnosis not present

## 2019-07-09 DIAGNOSIS — G40209 Localization-related (focal) (partial) symptomatic epilepsy and epileptic syndromes with complex partial seizures, not intractable, without status epilepticus: Secondary | ICD-10-CM

## 2019-07-09 DIAGNOSIS — S14109S Unspecified injury at unspecified level of cervical spinal cord, sequela: Secondary | ICD-10-CM

## 2019-07-09 DIAGNOSIS — G909 Disorder of the autonomic nervous system, unspecified: Secondary | ICD-10-CM

## 2019-07-09 DIAGNOSIS — I951 Orthostatic hypotension: Secondary | ICD-10-CM | POA: Diagnosis present

## 2019-07-09 DIAGNOSIS — M4802 Spinal stenosis, cervical region: Secondary | ICD-10-CM

## 2019-07-09 DIAGNOSIS — G992 Myelopathy in diseases classified elsewhere: Secondary | ICD-10-CM

## 2019-07-09 DIAGNOSIS — Z9889 Other specified postprocedural states: Secondary | ICD-10-CM | POA: Insufficient documentation

## 2019-07-09 MED ORDER — LEVETIRACETAM 500 MG PO TABS: 500.0000 mg | ORAL_TABLET | Freq: Two times a day (BID) | ORAL | 4 refills | Status: DC

## 2019-07-09 NOTE — Progress Notes (Signed)
Subjective:    Patient ID: Denise Espinoza, female    DOB: [birth year], 85 y.o.   MRN: [id number]  HPI   Mrs. Latella is here in follow up of her SCI. She is walking now with a straight cane. She was admitted with syncope and word finding defiicts in august and ultimately had a ICD placed on 8/15/ 2020. She also ended up on keppra for seizure rx.  Since being home she has felt much better. She hasn't had any drop out episodes. She does report fatigue with longer distance exercise.   She still has some pain in her neck with rotation as well as her low back at her belt line. The back bothers her more as the day progresses and as she's more active. The back pain essentially does not radiate. The pain can interrupt her sleep although she generally feels a little better by the morning  She continues to void frequently. She may go as often as every hour. She's not on any medication for her bladder currently. Bowels are generally regulated.       Pain Inventory Average Pain 3 Pain Right Now 6 My pain is intermittent, sharp, dull and aching  In the last 24 hours, has pain interfered with the following? General activity 6 Relation with others 6 Enjoyment of life 6 What TIME of day is your pain at its worst? morning and night Sleep (in general) Poor  Pain is worse with: walking and bending Pain improves with: rest and medication Relief from Meds: 6  Mobility use a cane how many minutes can you walk? 15 ability to climb steps?  yes do you drive?  no  Function retired I need assistance with the following:  household duties and shopping  Neuro/Psych bladder control problems weakness numbness  Prior Studies x-rays CT/MRI  Physicians involved in your care Primary care Kristen Grabowski,FNP Neurologist Dr. Marjory Lies Cardiologist- [facility]   Family History  Problem Relation Age of Onset  . Cancer Mother   . High blood pressure Father   . Emphysema Brother    Social  History   Socioeconomic History  . Marital status: Widowed    Spouse name: Not on file  . Number of children: 2  . Years of education: 54  . Highest education level: Not on file  Occupational History  . Occupation: Retired  Engineer, production  . Financial resource strain: Not on file  . Food insecurity    Worry: Not on file    Inability: Not on file  . Transportation needs    Medical: Not on file    Non-medical: Not on file  Tobacco Use  . Smoking status: Never Smoker  . Smokeless tobacco: Never Used  Substance and Sexual Activity  . Alcohol use: Yes    Comment: rare  . Drug use: No  . Sexual activity: Not on file  Lifestyle  . Physical activity    Days per week: Not on file    Minutes per session: Not on file  . Stress: Not on file  Relationships  . Social Musician on phone: Not on file    Gets together: Not on file    Attends religious service: Not on file    Active member of club or organization: Not on file    Attends meetings of clubs or organizations: Not on file    Relationship status: Not on file  Other Topics Concern  . Not on file  Social History Narrative  Lives w/grand dgtr, son   Caffeine- coffee 1 cup daily   Past Surgical History:  Procedure Laterality Date  . COLON RESECTION  1989   diverticulitis  . COLONOSCOPY    . ESOPHAGEAL DILATION  2015   endoscopy  . PACEMAKER IMPLANT N/A 05/23/2019   Procedure: PACEMAKER IMPLANT;  Surgeon: Constance Haw, MD;  Location: [facility];  Service: Cardiovascular;  Laterality: N/A;  . POSTERIOR CERVICAL FUSION/FORAMINOTOMY N/A 02/11/2019   Procedure: POSTERIOR CERVICAL INSTRUMENTATION REMOVAL CERVICAL TWO;  Surgeon: Consuella Lose, MD;  Location: [facility];  Service: Neurosurgery;  Laterality: N/A;  . POSTERIOR CERVICAL LAMINECTOMY N/A 02/06/2019   Procedure: Cervical one LAMINECTOMY, Occiput-Cervical four fusion;  Surgeon: Consuella Lose, MD;  Location: [facility];  Service: Neurosurgery;   Laterality: N/A;   Past Medical History:  Diagnosis Date  . Allergic rhinitis   . Anosmia   . Arthritis   . Full dentures   . History of diverticulitis   . Insomnia   . Seizure ([facility]) 2020  . Senile osteoporosis   . SOB (shortness of breath)   . SVT (supraventricular tachycardia) ([facility])   . Varicose vein of leg    left  . Wears glasses    BP (!) 174/89   Pulse 83   Temp 97.7 F (36.5 C)   Ht 5\' 3"  (1.6 m)   Wt 142 lb (64.4 kg)   SpO2 95%   BMI 25.15 kg/m   Opioid Risk Score:   Fall Risk Score:  `1  Depression screen PHQ 2/9  No flowsheet data found.   Review of Systems  Constitutional: Negative.   HENT: Negative.   Eyes: Negative.   Respiratory: Negative.   Cardiovascular: Negative.   Gastrointestinal: Negative.   Endocrine: Negative.   Genitourinary: Negative.   Musculoskeletal: Negative.   Skin: Negative.   Allergic/Immunologic: Negative.   Neurological: Positive for weakness and numbness.  Hematological: Negative.   Psychiatric/Behavioral: Negative.   All other systems reviewed and are negative.      Objective:   Physical Exam General: No acute distress HEENT: EOMI, oral membranes moist Cards: reg rate  Chest: normal effort Abdomen: Soft, NT, ND Skin: dry, intact Extremities: no edema  Neuro: Pt is cognitively appropriate with normal insight, memory, and awareness. Cranial nerves 2-12 are intact. Sensory exam is decreased to LT in both hands and feet still, hands are more affected. . Reflexes are 1+ in all 4's. Fine motor coordination is intact. No tremors. Motor function is grossly 4/5 in UE and LE's with decreased LT. Gait is slightly wide based. She sometimes moves a little too fast. Took wide, quick turns.  Used straight cane for balance Musculoskeletal: Full ROM, No pain with AROM or PROM in the neck, trunk, or extremities. Arthritic deformities bilateral hands.  Psych: Pt's affect is appropriate. Pt is cooperative         Assessment  & Plan:  1.Functional and mobility deficitssecondary to C2 fracture and 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 Pt is s/p C1 laminectomy, C2 ORIF and Occiput to C4 PLIF -ENCOURAGED pt to arrange her f/u outpt therapies at [facility]  -might do well with aquatic based therapies  -would like therapies to address her low back 2. Lower extremity edema: likely d/t dependent edema             --continue compression/ACE wraps 3. Pain Management: voltaren gel prn, tylenol             -increased low back pain  suspicious for facet arthropathy  -will check xrays today  -therapy as above 4. H/o SVT with ongoing syncopal/presyncopal episodes:             -per cardiology  -ICD placed on 05/24/2019 5. Seizures: keppra per neurology. No eleptiform activity was seen on EEG and MRI was negative.  9. Neurogenic bowel:  Relatively controlled with current regimen 10. Neurogenic bladder:Has been incontinent for years.  -time toileting      -discussed double voiding/massage  -probably needs urology referral       15 minutes of face to face patient care time were spent during this visit. All questions were encouraged and answered. Follow up with me in 2 MONTHS.

## 2019-07-09 NOTE — Progress Notes (Signed)
[facility]  PATIENT: Denise Espinoza DOB: 03-31-1930  REFERRING CLINICIAN: Amrit Adhikari HISTORY FROM: patient  REASON FOR VISIT: new consult    HISTORICAL  CHIEF COMPLAINT:  Chief Complaint  Patient presents with  . Seizures    rm 7  New Pt, grand dgtr- Alex, "worked up for stroke-neg; diagnosed with seizures"    HISTORY OF PRESENT ILLNESS:   85 year old female here for evaluation of seizure.  History of C2 fracture (displaced type 2 dens fracture) and myelopathy in March 2020 status post laminectomy and fusion (ORIF) in April 2020.  Thereafter patient started to have intermittent episodes of dizziness and lightheadedness.  In August 2020 patient had episode of word finding difficulties and was taken to the hospital for stroke evaluation.  No acute findings on MRI of the brain.  Possibility of seizure was raised and EEG was unremarkable.  Patient was empirically started on Keppra for seizure prevention.  Patient was also found to have orthostatic hypotension, dizziness, syncope associated with 2 to 3-second pause on telemetry.  Ultimately patient was treated with dual-chamber pacemaker placement on 05/23/2019.  Since that time no further spells or events.  Tolerating levetiracetam.   REVIEW OF SYSTEMS: Full 14 system review of systems performed and negative with exception of: as per HPI.   ALLERGIES: Allergies  Allergen Reactions  . Penicillins Hives, Swelling, Rash and Other (See Comments)    Did it involve swelling of the face/tongue/throat, SOB, or low BP? #  #  #  YES  #  #  #  Did it involve sudden or severe rash/hives, skin peeling, or any reaction on the inside of your mouth or nose? No Did you need to seek medical attention at a hospital or doctor's office? #  #  #  YES  #  #  #  When did it last happen?85 years old.   . Codeine Phosphate [Codeine] Other (See Comments)    headache    HOME MEDICATIONS: Outpatient Medications Prior to Visit   Medication Sig Dispense Refill  . acetaminophen (TYLENOL) 325 MG tablet Take 1-2 tablets (325-650 mg total) by mouth every 4 (four) hours as needed for mild pain.    . calcium-vitamin D (OSCAL WITH D) 500-200 MG-UNIT per tablet Take 1 tablet by mouth 2 (two) times daily.     . ciprofloxacin (CIPRO) 500 MG tablet Take 500 mg by mouth 2 (two) times daily. X 10 days for UTI, completes 07/10/19    . Dextran 70-Hypromellose (ARTIFICIAL TEARS) 0.1-0.3 % SOLN Place 1 drop into both eyes 3 (three) times daily as needed.    . diclofenac sodium (VOLTAREN) 1 % GEL Apply 2 g topically 4 (four) times daily. 350 g 0  . fludrocortisone (FLORINEF) 0.1 MG tablet Take 1 tablet (0.1 mg total) by mouth daily. 90 tablet 3  . gabapentin (NEURONTIN) 300 MG capsule Take 1 capsule (300 mg total) by mouth 3 (three) times daily. 90 capsule 0  . levETIRAcetam (KEPPRA) 500 MG tablet Take 1 tablet (500 mg total) by mouth 2 (two) times daily. 60 tablet 0  . mirtazapine (REMERON) 15 MG tablet Take 15 mg by mouth at bedtime.    . NON FORMULARY Diet Type: Regular    . Nutritional Supplements (ENSURE ENLIVE PO) Take 1 Bottle by mouth daily. Prefers Vanilla    . pantoprazole (PROTONIX) 40 MG tablet Take 1 tablet (40 mg total) by mouth 2 (two) times daily. 60 tablet 0  . polyethylene glycol (MIRALAX /  GLYCOLAX) 17 g packet Take 17 g by mouth daily.    . rosuvastatin (CRESTOR) 5 MG tablet Take 1 tablet (5 mg total) by mouth daily. 30 tablet 0  . sennosides-docusate sodium (SENOKOT-S) 8.6-50 MG tablet Take 2 tablets by mouth 2 (two) times daily as needed for constipation.    . traMADol (ULTRAM) 50 MG tablet Take 50 mg by mouth 3 (three) times daily as needed.    Marland Kitchen. oxyCODONE-acetaminophen (PERCOCET) 5-325 MG tablet Take 1 tablet by mouth every 4 (four) hours as needed for severe pain. (Patient not taking: Reported on 07/09/2019) 15 tablet 0   No facility-administered medications prior to visit.     PAST MEDICAL HISTORY: Past Medical  History:  Diagnosis Date  . Allergic rhinitis   . Anosmia   . Arthritis   . Full dentures   . History of diverticulitis   . Insomnia   . Seizure (HCC) 2020  . Senile osteoporosis   . SOB (shortness of breath)   . SVT (supraventricular tachycardia) (HCC)   . Varicose vein of leg    left  . Wears glasses     PAST SURGICAL HISTORY: Past Surgical History:  Procedure Laterality Date  . COLON RESECTION  1989   diverticulitis  . COLONOSCOPY    . ESOPHAGEAL DILATION  2015   endoscopy  . PACEMAKER IMPLANT N/A 05/23/2019   Procedure: PACEMAKER IMPLANT;  Surgeon: Regan Lemmingamnitz, Will Martin, MD;  [facility]: [facility];  Service: Cardiovascular;  Laterality: N/A;  . POSTERIOR CERVICAL FUSION/FORAMINOTOMY N/A 02/11/2019   Procedure: POSTERIOR CERVICAL INSTRUMENTATION REMOVAL CERVICAL TWO;  Surgeon: Lisbeth RenshawNundkumar, Neelesh, MD;  Location: [facility] OR;  Service: Neurosurgery;  Laterality: N/A;  . POSTERIOR CERVICAL LAMINECTOMY N/A 02/06/2019   Procedure: Cervical one LAMINECTOMY, Occiput-Cervical four fusion;  Surgeon: Lisbeth RenshawNundkumar, Neelesh, MD;  Location: [facility] OR;  Service: Neurosurgery;  Laterality: N/A;    FAMILY HISTORY: Family History  Problem Relation Age of Onset  . Cancer Mother   . High blood pressure Father   . Emphysema Brother     SOCIAL HISTORY: Social History   Socioeconomic History  . Marital status: Widowed    Spouse name: Not on file  . Number of children: 2  . Years of education: 4612  . Highest education level: Not on file  Occupational History  . Occupation: Retired  Engineer, productionocial Needs  . Financial resource strain: Not on file  . Food insecurity    Worry: Not on file    Inability: Not on file  . Transportation needs    Medical: Not on file    Non-medical: Not on file  Tobacco Use  . Smoking status: Never Smoker  . Smokeless tobacco: Never Used  Substance and Sexual Activity  . Alcohol use: Yes    Comment: rare  . Drug use: No  . Sexual activity: Not on file  Lifestyle  .  Physical activity    Days per week: Not on file    Minutes per session: Not on file  . Stress: Not on file  Relationships  . Social Musicianconnections    Talks on phone: Not on file    Gets together: Not on file    Attends religious service: Not on file    Active member of club or organization: Not on file    Attends meetings of clubs or organizations: Not on file    Relationship status: Not on file  . Intimate partner violence    Fear of current or ex partner: Not  on file    Emotionally abused: Not on file    Physically abused: Not on file    Forced sexual activity: Not on file  Other Topics Concern  . Not on file  Social History Narrative   Lives w/grand dgtr, son   Caffeine- coffee 1 cup daily     PHYSICAL EXAM  GENERAL EXAM/CONSTITUTIONAL: Vitals:  Vitals:   07/09/19 0938  BP: (!) 144/82  Pulse: 84  Temp: 97.8 F (36.6 C)  Weight: 142 lb (64.4 kg)  Height: 5\' 3"  (1.6 m)     Body mass index is 25.15 kg/m. Wt Readings from Last 3 Encounters:  07/09/19 142 lb (64.4 kg)  06/05/19 143 lb (64.9 kg)  05/17/19 141 lb (64 kg)     Patient is in no distress; well developed, nourished and groomed; neck is supple  CARDIOVASCULAR:  Examination of carotid arteries is normal; no carotid bruits  Regular rate and rhythm, no murmurs  Examination of peripheral vascular system by observation and palpation is normal  EYES:  Ophthalmoscopic exam of optic discs and posterior segments is normal; no papilledema or hemorrhages  No exam data present  MUSCULOSKELETAL:  Gait, strength, tone, movements noted in Neurologic exam below  NEUROLOGIC: MENTAL STATUS:  No flowsheet data found.  awake, alert, oriented to person, place and time  recent and remote memory intact  normal attention and concentration  language fluent, comprehension intact, naming intact  fund of knowledge appropriate  CRANIAL NERVE:   2nd - no papilledema on fundoscopic exam  2nd, 3rd, 4th, 6th  - pupils equal and reactive to light, visual fields full to confrontation, extraocular muscles intact, no nystagmus  5th - facial sensation symmetric  7th - facial strength symmetric  8th - hearing intact  9th - palate elevates symmetrically, uvula midline  11th - shoulder shrug symmetric  12th - tongue protrusion midline  MOTOR:   normal bulk and tone; BUE AND BLE 4  SENSORY:   normal and symmetric to light touch  COORDINATION:   finger-nose-finger, fine finger movements SLOW   REFLEXES:   deep tendon reflexes 1+ and symmetric  GAIT/STATION:   narrow based gait; SLOW TO STAND; UNSTEADY WITH SINGLE POINT CANE     DIAGNOSTIC DATA (LABS, IMAGING, TESTING) - I reviewed patient records, labs, notes, testing and imaging myself where available.  Lab Results  Component Value Date   WBC 3.7 (L) 05/18/2019   HGB 11.9 (L) 05/18/2019   HCT 37.2 05/18/2019   MCV 91.6 05/18/2019   PLT 189 05/18/2019      Component Value Date/Time   NA 143 05/19/2019 0619   K 3.8 05/19/2019 0619   CL 109 05/19/2019 0619   CO2 25 05/19/2019 0619   GLUCOSE 97 05/19/2019 0619   BUN 16 05/19/2019 0619   CREATININE 0.65 05/19/2019 0619   CALCIUM 8.9 05/19/2019 0619   PROT 7.3 05/17/2019 1533   ALBUMIN 4.0 05/17/2019 1533   AST 20 05/17/2019 1533   ALT 10 05/17/2019 1533   ALKPHOS 91 05/17/2019 1533   BILITOT 0.9 05/17/2019 1533   GFRNONAA >60 05/19/2019 0619   GFRAA >60 05/19/2019 0619   Lab Results  Component Value Date   CHOL 148 05/18/2019   HDL 58 05/18/2019   LDLCALC 76 05/18/2019   TRIG 70 05/18/2019   CHOLHDL 2.6 05/18/2019   Lab Results  Component Value Date   HGBA1C 5.2 05/18/2019   No results found for: [id number] Lab Results  Component Value Date  TSH 0.659 05/22/2019    05/18/19 EEG - normal   05/18/19 MRI brain / MRA head / neck [I reviewed images myself and agree with interpretation. -VRP] 1. Chronic ischemic microangiopathy and generalized volume loss  without acute intracranial abnormality. 2. Normal cervical and intracranial MRA.   ASSESSMENT AND PLAN  85 y.o. year old female here with C2 fracture, myelopathy, spinal cord injury, with autonomic dysfunction and syncope attacks as a result.  Also with abnormal spells of transient word finding difficulties, possible TIA versus seizure.  Symptoms improved since starting levetiracetam and pacemaker placement.  We will continue antiseizure medication for now.  May consider to taper off after 1 to 2 years depending on how symptoms progress or resolve.  Dx:  1. Partial symptomatic epilepsy with complex partial seizures, not intractable, without status epilepticus (HCC)   2. Autonomic dysfunction   3. Injury of cervical spinal cord, sequela (HCC)      PLAN:  - continue levetiracetam 500mg  twice a day; may consider to taper off in 1-2 years - no driving for now (minimum 6 month waiting period; also has gait and weakness issues that may prevent return to driving even after 6 month time frame)  Meds ordered this encounter  Medications  . levETIRAcetam (KEPPRA) 500 MG tablet    Sig: Take 1 tablet (500 mg total) by mouth 2 (two) times daily.    Dispense:  180 tablet    Refill:  4   Return in about 6 months (around 01/06/2020) for with NP (Amy Lomax).    Suanne Marker, MD 07/09/2019, 9:53 AM Certified in Neurology, Neurophysiology and Neuroimaging  [facility] [address], [address] [place], [place] [address] [phone]

## 2019-07-09 NOTE — Patient Instructions (Signed)
-   continue levetiracetam 500mg  twice a day   - no driving (due to seizure, syncope, weakness)

## 2019-07-09 NOTE — Patient Instructions (Signed)
MAKE SURE THERAPY WORKS ON SOME STRENGTHENING AND POSTURE EXERCISES FOR YOUR TRUNK , LOWBACK, AND PELVIS   MASSAGE YOUR BLADDER, WAIT A MOMENT AND TRAY AGAIN TO EMPTY.   IF NO RESULTS, STAND FOR A MINUTE, THEN MASSAGE AGAIN, AND GIVE IT ANOTHER TRY  YOU MAY NEED UROLOGY REFERRAL.

## 2019-07-21 ENCOUNTER — Other Ambulatory Visit: Payer: Self-pay | Admitting: Adult Health

## 2019-07-23 ENCOUNTER — Other Ambulatory Visit: Payer: Self-pay | Admitting: Adult Health

## 2019-07-24 ENCOUNTER — Encounter: Payer: Self-pay | Admitting: Internal Medicine

## 2019-07-24 ENCOUNTER — Telehealth (INDEPENDENT_AMBULATORY_CARE_PROVIDER_SITE_OTHER): Payer: Medicare Other | Admitting: Internal Medicine

## 2019-07-24 VITALS — Ht 63.0 in | Wt 142.0 lb

## 2019-07-24 DIAGNOSIS — I471 Supraventricular tachycardia, unspecified: Secondary | ICD-10-CM

## 2019-07-24 DIAGNOSIS — Z8679 Personal history of other diseases of the circulatory system: Secondary | ICD-10-CM | POA: Diagnosis not present

## 2019-07-24 DIAGNOSIS — I951 Orthostatic hypotension: Secondary | ICD-10-CM | POA: Diagnosis not present

## 2019-07-24 DIAGNOSIS — Z950 Presence of cardiac pacemaker: Secondary | ICD-10-CM | POA: Diagnosis not present

## 2019-07-24 NOTE — Progress Notes (Signed)
Virtual Visit via Telephone Note   This visit type was conducted due to national recommendations for restrictions regarding the COVID-19 Pandemic (e.g. social distancing) in an effort to limit this patient's exposure and mitigate transmission in our community.  Due to her co-morbid illnesses, this patient is at least at moderate risk for complications without adequate follow up.  This format is felt to be most appropriate for this patient at this time.  The patient did not have access to video technology/had technical difficulties with video requiring transitioning to audio format only (telephone).  All issues noted in this document were discussed and addressed.  No physical exam could be performed with this format.  Please refer to the patient's chart for her  consent to telehealth for [facility].   Evaluation Performed:  Telephone follow-up  Date:  07/24/2019   ID:  Denise Espinoza, DOB 06-Oct-1930, MRN [id number]  Patient Location:  [address]  [place] [address]  Provider location:   [address] [place], [place] [address]  PCP:  Beola Cord, [place]  Cardiologist:  Raechel Chute, MD Electrophysiologist:  None   Chief Complaint:  No complaints  History of Present Illness:    Denise Espinoza is a 85 y.o. female who presents via audio/video conferencing for a telehealth visit today.  Ms. Kos is a pleasant 85 year old female who is seen today via telephone visit for follow-up of recent hospitalization.  I first met her in the hospital for the evaluation of syncope.  She was found to have some SVT.  It was thought that she had orthostasis and was on Florinef.  She did wear a monitor that demonstrated sinus pauses up to a couple seconds but she was asymptomatic with this.  Subsequently while hospitalized she had more than 4-second witnessed pause when I was speaking with her in the room.  This promptly led to EP evaluation and ultimately she was found to have  significant sinus node dysfunction.  Despite stopping her beta-blockers, ultimately she required placement of a pacemaker.  Since then however she is done very well.  She had no further presyncopal, dizzy or syncopal episodes.  The patient does not have symptoms concerning for COVID-19 infection (fever, chills, cough, or new SHORTNESS OF BREATH).    Prior CV studies:   The following studies were reviewed today:  Chart reviewed  PMHx:  Past Medical History:  Diagnosis Date  . Allergic rhinitis   . Anosmia   . Arthritis   . Full dentures   . History of diverticulitis   . Insomnia   . Seizure ([place]) 2020  . Senile osteoporosis   . SOB (shortness of breath)   . SVT (supraventricular tachycardia) ([place])   . Varicose vein of leg    left  . Wears glasses     Past Surgical History:  Procedure Laterality Date  . COLON RESECTION  1989   diverticulitis  . COLONOSCOPY    . ESOPHAGEAL DILATION  2015   endoscopy  . PACEMAKER IMPLANT N/A 05/23/2019   Procedure: PACEMAKER IMPLANT;  Surgeon: Constance Haw, MD;  Location: [facility];  Service: Cardiovascular;  Laterality: N/A;  . POSTERIOR CERVICAL FUSION/FORAMINOTOMY N/A 02/11/2019   Procedure: POSTERIOR CERVICAL INSTRUMENTATION REMOVAL CERVICAL TWO;  Surgeon: Consuella Lose, MD;  Location: [facility];  Service: Neurosurgery;  Laterality: N/A;  . POSTERIOR CERVICAL LAMINECTOMY N/A 02/06/2019   Procedure: Cervical one LAMINECTOMY, Occiput-Cervical four fusion;  Surgeon: Consuella Lose, MD;  Location: [facility];  Service: Neurosurgery;  Laterality: N/A;    FAMHx:  Family History  Problem Relation Age of Onset  . Cancer Mother   . High blood pressure Father   . Emphysema Brother     SOCHx:   reports that she has never smoked. She has never used smokeless tobacco. She reports current alcohol use. She reports that she does not use drugs.  ALLERGIES:  Allergies  Allergen Reactions  . Penicillins Hives, Swelling, Rash and  Other (See Comments)    Did it involve swelling of the face/tongue/throat, SOB, or low BP? #  #  #  YES  #  #  #  Did it involve sudden or severe rash/hives, skin peeling, or any reaction on the inside of your mouth or nose? No Did you need to seek medical attention at a hospital or doctor's office? #  #  #  YES  #  #  #  When did it last happen?85 years old.   . Codeine Phosphate [Codeine] Other (See Comments)    headache    MEDS:  Current Meds  Medication Sig  . acetaminophen (TYLENOL) 325 MG tablet Take 1-2 tablets (325-650 mg total) by mouth every 4 (four) hours as needed for mild pain.  Marland Kitchen Dextran 70-Hypromellose (ARTIFICIAL TEARS) 0.1-0.3 % SOLN Place 1 drop into both eyes 3 (three) times daily as needed.  . fludrocortisone (FLORINEF) 0.1 MG tablet Take 1 tablet (0.1 mg total) by mouth daily.  Marland Kitchen gabapentin (NEURONTIN) 300 MG capsule Take 1 capsule (300 mg total) by mouth 3 (three) times daily.  Marland Kitchen levETIRAcetam (KEPPRA) 500 MG tablet Take 1 tablet (500 mg total) by mouth 2 (two) times daily.  . mirtazapine (REMERON) 15 MG tablet Take 15 mg by mouth at bedtime.  . Multiple Vitamin (MULTIVITAMIN) capsule Take 1 capsule by mouth daily.  . NON FORMULARY Diet Type: Regular  . Nutritional Supplements (ENSURE ENLIVE PO) Take 1 Bottle by mouth daily. Prefers Vanilla  . pantoprazole (PROTONIX) 40 MG tablet Take 1 tablet (40 mg total) by mouth 2 (two) times daily.  . polyethylene glycol (MIRALAX / GLYCOLAX) 17 g packet Take 17 g by mouth daily.  . rosuvastatin (CRESTOR) 5 MG tablet Take 1 tablet (5 mg total) by mouth daily.  . sennosides-docusate sodium (SENOKOT-S) 8.6-50 MG tablet Take 2 tablets by mouth 2 (two) times daily as needed for constipation.  . traMADol (ULTRAM) 50 MG tablet Take 50 mg by mouth 3 (three) times daily as needed.  . [DISCONTINUED] calcium-vitamin D (OSCAL WITH D) 500-200 MG-UNIT per tablet Take 1 tablet by mouth 2 (two) times daily.   . [DISCONTINUED] ciprofloxacin  (CIPRO) 500 MG tablet Take 500 mg by mouth 2 (two) times daily. X 10 days for UTI, completes 07/10/19  . [DISCONTINUED] diclofenac sodium (VOLTAREN) 1 % GEL Apply 2 g topically 4 (four) times daily.  . [DISCONTINUED] oxyCODONE-acetaminophen (PERCOCET) 5-325 MG tablet Take 1 tablet by mouth every 4 (four) hours as needed for severe pain.     ROS: Pertinent items noted in HPI and remainder of comprehensive ROS otherwise negative.  Labs/Other Tests and Data Reviewed:    Recent Labs: 05/17/2019: ALT 10 05/18/2019: Hemoglobin 11.9; Platelets 189 05/19/2019: BUN 16; Creatinine, Ser 0.65; Potassium 3.8; Sodium 143 05/22/2019: TSH 0.659   Recent Lipid Panel Lab Results  Component Value Date/Time   CHOL 148 05/18/2019 05:30 AM   TRIG 70 05/18/2019 05:30 AM   HDL 58 05/18/2019 05:30 AM   CHOLHDL 2.6 05/18/2019 05:30  AM   LDLCALC 76 05/18/2019 05:30 AM    Wt Readings from Last 3 Encounters:  07/24/19 142 lb (64.4 kg)  07/09/19 142 lb (64.4 kg)  07/09/19 142 lb (64.4 kg)     Exam:    Vital Signs:  Ht '5\' 3"'$  (1.6 m)   Wt 142 lb (64.4 kg)   BMI 25.15 kg/m    Exam not performed due to telephone visit  ASSESSMENT & PLAN:    1. Sinus node dysfunction status post pacemaker 2. History of SVT and pauses 3. Orthostatic hypotension  Overall Ms. Haller is doing well after her pacemaker.  This really is her primary cardiac issue.  She lives in the [place] area and would be more convenient for her to be followed in [place] with our EP docs.  She has an upcoming appointment with Dr. Lovena Le.  I do not think she needs general cardiology follow-up.  COVID-19 Education: The signs and symptoms of COVID-19 were discussed with the patient and how to seek care for testing (follow up with PCP or arrange E-visit).  The importance of social distancing was discussed today.  Patient Risk:   After full review of this patients clinical status, I feel that they are at least moderate risk at this  time.  Time:   Today, I have spent 15 minutes with the patient with telehealth technology discussing pacemaker, cardiac symptoms, orthostatic hypotension.     Medication Adjustments/Labs and Tests Ordered: Current medicines are reviewed at length with the patient today.  Concerns regarding medicines are outlined above.   Tests Ordered: No orders of the defined types were placed in this encounter.   Medication Changes: No orders of the defined types were placed in this encounter.   Disposition:  prn  Pixie Casino, MD, FACC, [place] Director of the Advanced Lipid Disorders &  Cardiovascular Risk Reduction Clinic Diplomate of the American Board of Clinical Lipidology Attending Cardiologist  Direct Dial: [phone]  Fax: [phone]  Website:  [email or web address]  Pixie Casino, MD  07/24/2019 12:43 PM

## 2019-07-24 NOTE — Patient Instructions (Addendum)
Medication Instructions:  Your physician recommends that you continue on your current medications as directed. Please refer to the Current Medication list given to you today.  *If you need a refill on your cardiac medications before your next appointment, please call your pharmacy*  Follow-Up: At [facility], you and your health needs are our priority.  As part of our continuing mission to provide you with exceptional heart care, we have created designated Provider Care Teams.  These Care Teams include your primary Cardiologist (physician) and Advanced Practice Providers (APPs -  Physician Assistants and Nurse Practitioners) who all work together to provide you with the care you need, when you need it.  Your next appointment:   Dr. Lovena Le 08/26/2019 @ 11:15am in [place]

## 2019-07-25 ENCOUNTER — Telehealth: Payer: Self-pay

## 2019-07-25 MED ORDER — FLUDROCORTISONE ACETATE 0.1 MG PO TABS: 0.1000 mg | ORAL_TABLET | Freq: Every day | ORAL | 3 refills | Status: DC

## 2019-07-25 MED ORDER — GABAPENTIN 300 MG PO CAPS: 300.0000 mg | ORAL_CAPSULE | Freq: Three times a day (TID) | ORAL | 5 refills | Status: DC

## 2019-07-25 NOTE — Telephone Encounter (Signed)
I'm happy to refill and they should be refilled by me

## 2019-07-25 NOTE — Telephone Encounter (Signed)
Patients daughter called today, stated her mother is needing two refills on medications.  States has run into multiple problems  trying to get them refilled as even her own primary "refuses to refill what they did not originally sign for.   The medication they need at this time are Gabapentin and fludrocortisone.

## 2019-08-25 ENCOUNTER — Encounter: Payer: PRIVATE HEALTH INSURANCE | Admitting: Cardiology

## 2019-08-26 ENCOUNTER — Ambulatory Visit (INDEPENDENT_AMBULATORY_CARE_PROVIDER_SITE_OTHER): Payer: Medicare Other | Admitting: *Deleted

## 2019-08-26 ENCOUNTER — Encounter: Payer: PRIVATE HEALTH INSURANCE | Admitting: Internal Medicine

## 2019-08-26 DIAGNOSIS — R55 Syncope and collapse: Secondary | ICD-10-CM | POA: Diagnosis not present

## 2019-08-26 DIAGNOSIS — I471 Supraventricular tachycardia: Secondary | ICD-10-CM

## 2019-08-27 LAB — CUP PACEART REMOTE DEVICE CHECK
Battery Remaining Longevity: 174 mo
Battery Voltage: 3.2 V
Brady Statistic AP VP Percent: 0.04 %
Brady Statistic AP VS Percent: 12.47 %
Brady Statistic AS VP Percent: 0.07 %
Brady Statistic AS VS Percent: 87.41 %
Brady Statistic RA Percent Paced: 12.41 %
Brady Statistic RV Percent Paced: 0.12 %
Date Time Interrogation Session: 20201117031841
Implantable Lead Implant Date: 20200814
Implantable Lead Implant Date: 20200814
Implantable Lead Location: 753859
Implantable Lead Location: 753860
Implantable Lead Model: 5076
Implantable Lead Model: 5076
Implantable Pulse Generator Implant Date: 20200814
Lead Channel Impedance Value: 323 Ohm
Lead Channel Impedance Value: 437 Ohm
Lead Channel Impedance Value: 456 Ohm
Lead Channel Impedance Value: 494 Ohm
Lead Channel Pacing Threshold Amplitude: 0.5 V
Lead Channel Pacing Threshold Amplitude: 0.75 V
Lead Channel Pacing Threshold Pulse Width: 0.4 ms
Lead Channel Pacing Threshold Pulse Width: 0.4 ms
Lead Channel Sensing Intrinsic Amplitude: 1.125 mV
Lead Channel Sensing Intrinsic Amplitude: 1.125 mV
Lead Channel Sensing Intrinsic Amplitude: 16.375 mV
Lead Channel Sensing Intrinsic Amplitude: 16.375 mV
Lead Channel Setting Pacing Amplitude: 3 V
Lead Channel Setting Pacing Amplitude: 3 V
Lead Channel Setting Pacing Pulse Width: 0.4 ms
Lead Channel Setting Sensing Sensitivity: 2 mV

## 2019-09-10 ENCOUNTER — Encounter: Payer: Medicare Other | Attending: Physical Medicine & Rehabilitation | Admitting: Physical Medicine & Rehabilitation

## 2019-09-10 DIAGNOSIS — I471 Supraventricular tachycardia: Secondary | ICD-10-CM | POA: Insufficient documentation

## 2019-09-10 DIAGNOSIS — Z9889 Other specified postprocedural states: Secondary | ICD-10-CM | POA: Insufficient documentation

## 2019-09-10 DIAGNOSIS — I951 Orthostatic hypotension: Secondary | ICD-10-CM | POA: Insufficient documentation

## 2019-09-10 DIAGNOSIS — S14129S Central cord syndrome at unspecified level of cervical spinal cord, sequela: Secondary | ICD-10-CM | POA: Insufficient documentation

## 2019-09-23 ENCOUNTER — Other Ambulatory Visit: Payer: Self-pay

## 2019-09-23 ENCOUNTER — Ambulatory Visit (INDEPENDENT_AMBULATORY_CARE_PROVIDER_SITE_OTHER): Payer: Medicare Other | Admitting: Internal Medicine

## 2019-09-23 ENCOUNTER — Encounter: Payer: Self-pay | Admitting: Internal Medicine

## 2019-09-23 VITALS — BP 159/85 | Temp 97.5°F | Ht 64.0 in | Wt 143.6 lb

## 2019-09-23 DIAGNOSIS — Z8679 Personal history of other diseases of the circulatory system: Secondary | ICD-10-CM | POA: Diagnosis not present

## 2019-09-23 DIAGNOSIS — Z950 Presence of cardiac pacemaker: Secondary | ICD-10-CM

## 2019-09-23 DIAGNOSIS — I951 Orthostatic hypotension: Secondary | ICD-10-CM

## 2019-09-23 NOTE — Progress Notes (Signed)
HPI Denise Espinoza is referred today by Dr. Rennis Golden for ongoing PM followup. She is a pleasant 85 yo woman with sinus node dysfunction, orthostasis and HTN. She feels better since her PPM with essentally resolution of her orthostatic symptoms. She has not had syncope. She notes that her ankles are swelling some.  Allergies  Allergen Reactions  . Penicillins Hives, Swelling, Rash and Other (See Comments)    Did it involve swelling of the face/tongue/throat, SOB, or low BP? #  #  #  YES  #  #  #  Did it involve sudden or severe rash/hives, skin peeling, or any reaction on the inside of your mouth or nose? No Did you need to seek medical attention at a hospital or doctor's office? #  #  #  YES  #  #  #  When did it last happen?85 years old.   . Codeine Phosphate [Codeine] Other (See Comments)    headache     Current Outpatient Medications  Medication Sig Dispense Refill  . acetaminophen (TYLENOL) 325 MG tablet Take 1-2 tablets (325-650 mg total) by mouth every 4 (four) hours as needed for mild pain.    Marland Kitchen Dextran 70-Hypromellose (ARTIFICIAL TEARS) 0.1-0.3 % SOLN Place 1 drop into both eyes 3 (three) times daily as needed.    . fludrocortisone (FLORINEF) 0.1 MG tablet Take 1 tablet (0.1 mg total) by mouth daily. For one month then stop 30 tablet 3  . gabapentin (NEURONTIN) 300 MG capsule Take 1 capsule (300 mg total) by mouth 3 (three) times daily. 90 capsule 5  . levETIRAcetam (KEPPRA) 500 MG tablet Take 1 tablet (500 mg total) by mouth 2 (two) times daily. 180 tablet 4  . mirtazapine (REMERON) 15 MG tablet Take 15 mg by mouth at bedtime.    . Multiple Vitamin (MULTIVITAMIN) capsule Take 1 capsule by mouth daily.    . NON FORMULARY Diet Type: Regular    . Nutritional Supplements (ENSURE ENLIVE PO) Take 1 Bottle by mouth daily. Prefers Vanilla    . pantoprazole (PROTONIX) 40 MG tablet Take 1 tablet (40 mg total) by mouth 2 (two) times daily. 60 tablet 0  . polyethylene glycol (MIRALAX  / GLYCOLAX) 17 g packet Take 17 g by mouth daily.    . rosuvastatin (CRESTOR) 5 MG tablet Take 1 tablet (5 mg total) by mouth daily. 30 tablet 0  . sennosides-docusate sodium (SENOKOT-S) 8.6-50 MG tablet Take 2 tablets by mouth 2 (two) times daily as needed for constipation.    . traMADol (ULTRAM) 50 MG tablet Take 50 mg by mouth 3 (three) times daily as needed.     No current facility-administered medications for this visit.     Past Medical History:  Diagnosis Date  . Allergic rhinitis   . Anosmia   . Arthritis   . Full dentures   . History of diverticulitis   . Insomnia   . Seizure (HCC) 2020  . Senile osteoporosis   . SOB (shortness of breath)   . SVT (supraventricular tachycardia) (HCC)   . Varicose vein of leg    left  . Wears glasses     ROS:   All systems reviewed and negative except as noted in the HPI.   Past Surgical History:  Procedure Laterality Date  . COLON RESECTION  1989   diverticulitis  . COLONOSCOPY    . ESOPHAGEAL DILATION  2015   endoscopy  . PACEMAKER IMPLANT N/A 05/23/2019   Procedure:  PACEMAKER IMPLANT;  Surgeon: Constance Haw, MD;  Location: [facility];  Service: Cardiovascular;  Laterality: N/A;  . POSTERIOR CERVICAL FUSION/FORAMINOTOMY N/A 02/11/2019   Procedure: POSTERIOR CERVICAL INSTRUMENTATION REMOVAL CERVICAL TWO;  Surgeon: Consuella Lose, MD;  Location: [facility];  Service: Neurosurgery;  Laterality: N/A;  . POSTERIOR CERVICAL LAMINECTOMY N/A 02/06/2019   Procedure: Cervical one LAMINECTOMY, Occiput-Cervical four fusion;  Surgeon: Consuella Lose, MD;  Location: [facility];  Service: Neurosurgery;  Laterality: N/A;     Family History  Problem Relation Age of Onset  . Cancer Mother   . High blood pressure Father   . Emphysema Brother      Social History   Socioeconomic History  . Marital status: Widowed    Spouse name: Not on file  . Number of children: 2  . Years of education: 44  . Highest education level: Not  on file  Occupational History  . Occupation: Retired  Tobacco Use  . Smoking status: Never Smoker  . Smokeless tobacco: Never Used  Substance and Sexual Activity  . Alcohol use: Yes    Comment: rare  . Drug use: No  . Sexual activity: Not on file  Other Topics Concern  . Not on file  Social History Narrative   Lives w/grand dgtr, son   Caffeine- coffee 1 cup daily   Social Determinants of Health   Financial Resource Strain:   . Difficulty of Paying Living Expenses: Not on file  Food Insecurity:   . Worried About Charity fundraiser in the Last Year: Not on file  . Ran Out of Food in the Last Year: Not on file  Transportation Needs:   . Lack of Transportation (Medical): Not on file  . Lack of Transportation (Non-Medical): Not on file  Physical Activity:   . Days of Exercise per Week: Not on file  . Minutes of Exercise per Session: Not on file  Stress:   . Feeling of Stress : Not on file  Social Connections:   . Frequency of Communication with Friends and Family: Not on file  . Frequency of Social Gatherings with Friends and Family: Not on file  . Attends Religious Services: Not on file  . Active Member of Clubs or Organizations: Not on file  . Attends Archivist Meetings: Not on file  . Marital Status: Not on file  Intimate Partner Violence:   . Fear of Current or Ex-Partner: Not on file  . Emotionally Abused: Not on file  . Physically Abused: Not on file  . Sexually Abused: Not on file     BP (!) 159/85   Temp (!) 97.5 F (36.4 C)   Ht 5\' 4"  (1.626 m)   Wt 143 lb 9.6 oz (65.1 kg)   SpO2 97%   BMI 24.65 kg/m   Physical Exam:  Well appearing NAD HEENT: Unremarkable Neck:  No JVD, no thyromegally Lymphatics:  No adenopathy Back:  No CVA tenderness Lungs:  Clear with no wheezes HEART:  Regular rate rhythm, no murmurs, no rubs, no clicks Abd:  soft, positive bowel sounds, no organomegally, no rebound, no guarding Ext:  2 plus pulses, no edema, no  cyanosis, no clubbing Skin:  No rashes no nodules Neuro:  CN II through XII intact, motor grossly intact  DEVICE  Normal device function.  See PaceArt for details.   Assess/Plan: 1. Sinus node dysfunction - she is asymptomatic s/p PPM insertion. 2. PPM her medtronic DDD PM is working normally.  3. orthostasis - her symptoms have resolved. I asked her to stop her florinef but if her symptoms return, she is instructed to restart the florinef. 4. HTN - her bp is up and she has some peripheral edema. I asked her to stop the florinef and reduce her salt intake.  Leonia Reeves.D.

## 2019-09-23 NOTE — Patient Instructions (Signed)
Medication Instructions:  Your physician has recommended you make the following change in your medication:  Stop Taking Florinef   *If you need a refill on your cardiac medications before your next appointment, please call your pharmacy*  Lab Work: NONE  If you have labs (blood work) drawn today and your tests are completely normal, you will receive your results only by: Marland Kitchen MyChart Message (if you have MyChart) OR . A paper copy in the mail If you have any lab test that is abnormal or we need to change your treatment, we will call you to review the results.  Testing/Procedures: NONE   Follow-Up: At [facility], you and your health needs are our priority.  As part of our continuing mission to provide you with exceptional heart care, we have created designated Provider Care Teams.  These Care Teams include your primary Cardiologist (physician) and Advanced Practice Providers (APPs -  Physician Assistants and Nurse Practitioners) who all work together to provide you with the care you need, when you need it.  Your next appointment:   8 month(s)  The format for your next appointment:   In Person  Provider:   Cristopher Peru, MD  Other Instructions Thank you for choosing [facility]!

## 2019-09-23 NOTE — Progress Notes (Signed)
Remote pacemaker transmission.   

## 2019-11-25 ENCOUNTER — Ambulatory Visit (INDEPENDENT_AMBULATORY_CARE_PROVIDER_SITE_OTHER): Payer: Medicare Other | Admitting: *Deleted

## 2019-11-25 DIAGNOSIS — R55 Syncope and collapse: Secondary | ICD-10-CM | POA: Diagnosis not present

## 2019-11-25 LAB — CUP PACEART REMOTE DEVICE CHECK
Battery Remaining Longevity: 177 mo
Battery Voltage: 3.18 V
Brady Statistic AP VP Percent: 0.01 %
Brady Statistic AP VS Percent: 4.59 %
Brady Statistic AS VP Percent: 0.07 %
Brady Statistic AS VS Percent: 95.33 %
Brady Statistic RA Percent Paced: 4.54 %
Brady Statistic RV Percent Paced: 0.08 %
Date Time Interrogation Session: 20210215202952
Implantable Lead Implant Date: 20200814
Implantable Lead Implant Date: 20200814
Implantable Lead Location: 753859
Implantable Lead Location: 753860
Implantable Lead Model: 5076
Implantable Lead Model: 5076
Implantable Pulse Generator Implant Date: 20200814
Lead Channel Impedance Value: 323 Ohm
Lead Channel Impedance Value: 399 Ohm
Lead Channel Impedance Value: 399 Ohm
Lead Channel Impedance Value: 456 Ohm
Lead Channel Pacing Threshold Amplitude: 0.5 V
Lead Channel Pacing Threshold Amplitude: 0.5 V
Lead Channel Pacing Threshold Pulse Width: 0.4 ms
Lead Channel Pacing Threshold Pulse Width: 0.4 ms
Lead Channel Sensing Intrinsic Amplitude: 0.75 mV
Lead Channel Sensing Intrinsic Amplitude: 0.75 mV
Lead Channel Sensing Intrinsic Amplitude: 16.25 mV
Lead Channel Sensing Intrinsic Amplitude: 16.25 mV
Lead Channel Setting Pacing Amplitude: 1.5 V
Lead Channel Setting Pacing Amplitude: 2.5 V
Lead Channel Setting Pacing Pulse Width: 0.4 ms
Lead Channel Setting Sensing Sensitivity: 2 mV

## 2019-11-26 NOTE — Progress Notes (Signed)
PPM Remote  

## 2020-01-07 ENCOUNTER — Ambulatory Visit: Payer: Medicare Other | Admitting: Family Medicine

## 2020-02-09 ENCOUNTER — Other Ambulatory Visit: Payer: Self-pay | Admitting: Physical Medicine & Rehabilitation

## 2020-02-18 IMAGING — MR MRI CERVICAL SPINE WITHOUT CONTRAST
5 series · 35 of 48 positions shown · non-contrast
Comparison: MRI 02/04/2019.  CT 02/10/2019.

CLINICAL DATA: Previous C1 laminectomy and occiput put to C4
fusion. Previous chronic displaced C2 fracture.

EXAM:
MRI CERVICAL SPINE WITHOUT CONTRAST
TECHNIQUE: Multiplanar, multisequence MR imaging of the cervical spine was
performed. No intravenous contrast was administered.

[Series 5: T2 · sagittal · 3.0mm · 0.69mm/px · 6 of 15 slices shown (1 of 2)]
[im 1/15]
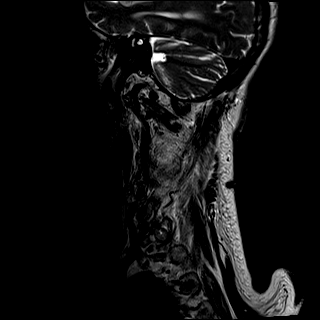
[im 3/15]
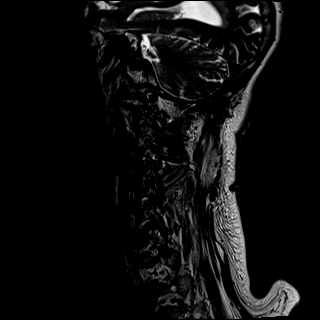
[im 6/15]
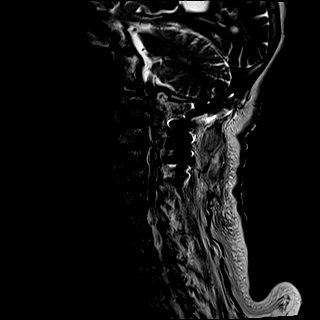
[im 9/15]
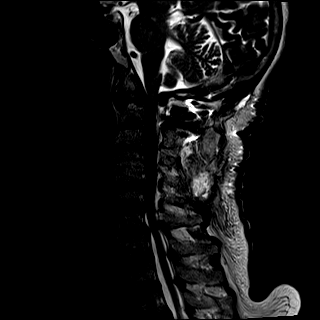
[im 12/15]
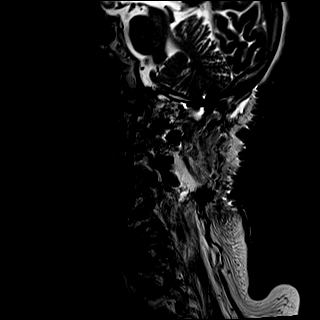
[im 15/15]
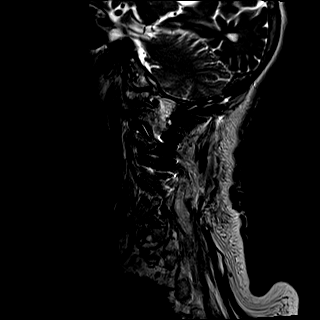

[Series 6: T1 · sagittal · 3.0mm · 0.69mm/px · 6 of 15 slices shown]
[im 1/15]
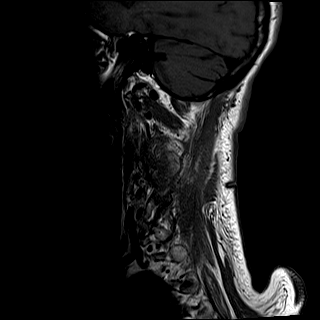
[im 3/15]
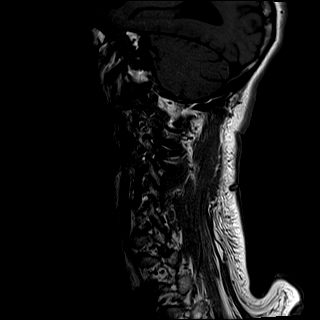
[im 6/15]
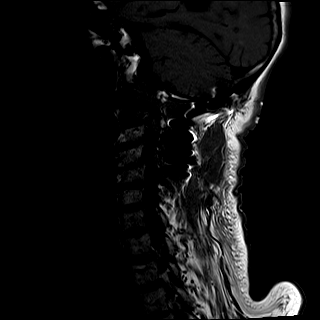
[im 9/15]
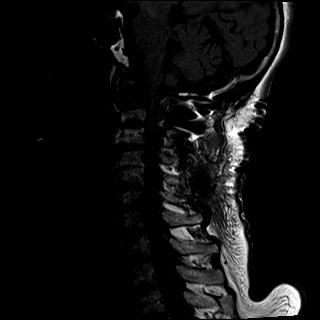
[im 12/15]
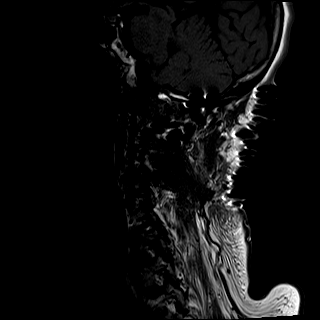
[im 15/15]
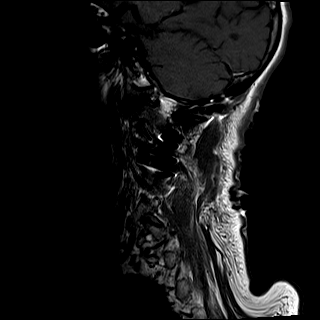

[Series 7: STIR · sagittal · 3.0mm · 0.86mm/px · 6 of 15 slices shown]
[im 1/15]
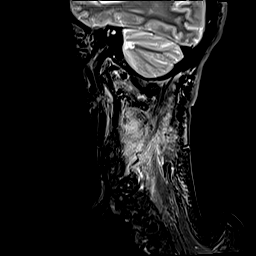
[im 3/15]
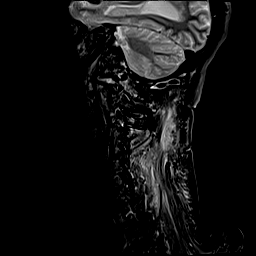
[im 6/15]
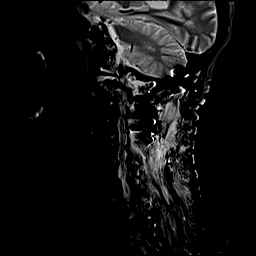
[im 9/15]
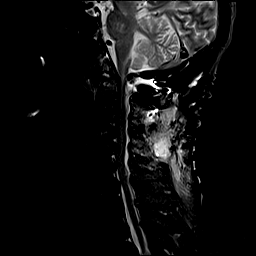
[im 12/15]
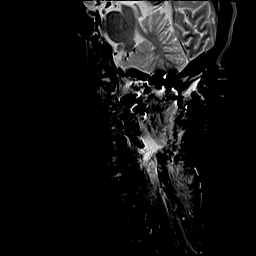
[im 15/15]
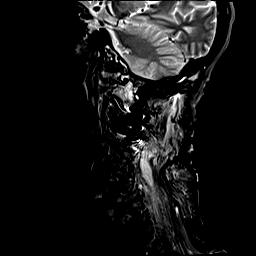

[Series 8: T2 · axial · 3.0mm · 0.66mm/px · z∈[-70,+56]mm · 9 of 40 slices shown (2 of 2)]
[im 1/40]
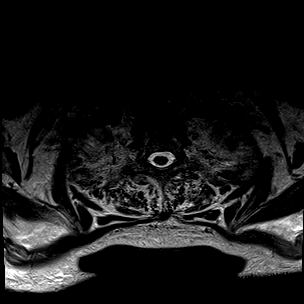
[im 6/40]
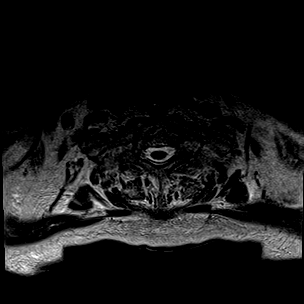
[im 12/40]
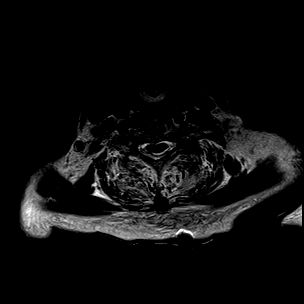
[im 17/40]
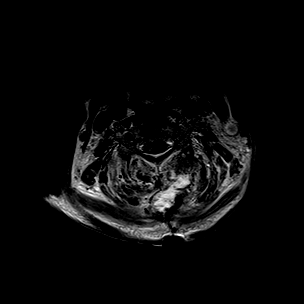
[im 20/40]
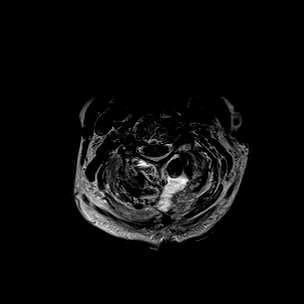
[im 23/40]
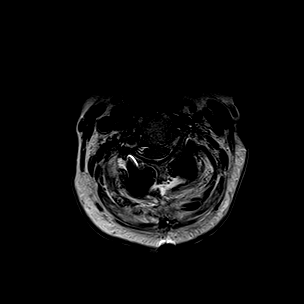
[im 28/40]
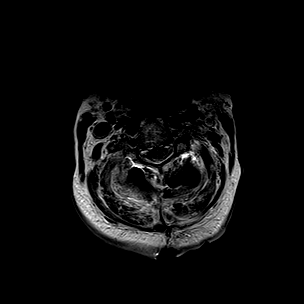
[im 34/40]
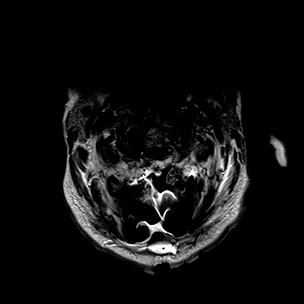
[im 40/40]
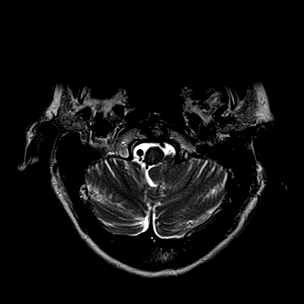

[Series 9: GRE · axial · 3.0mm · 0.39mm/px · z∈[-70,+56]mm · 8 of 40 slices shown]
[im 1/40]
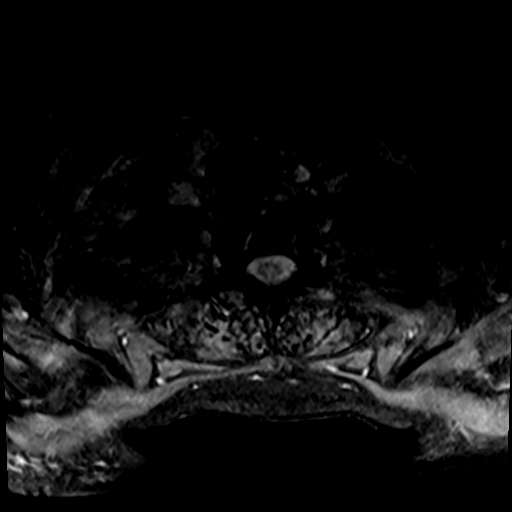
[im 6/40]
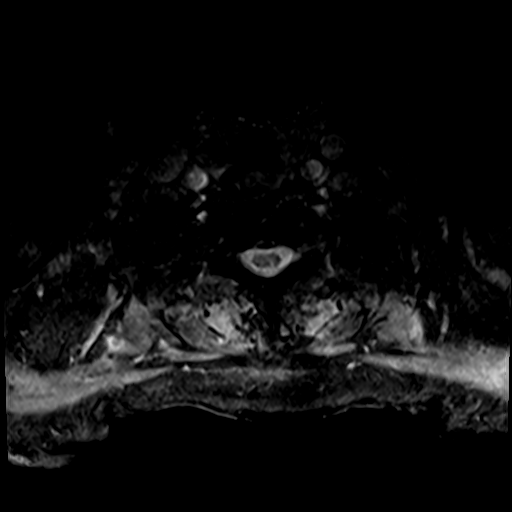
[im 12/40]
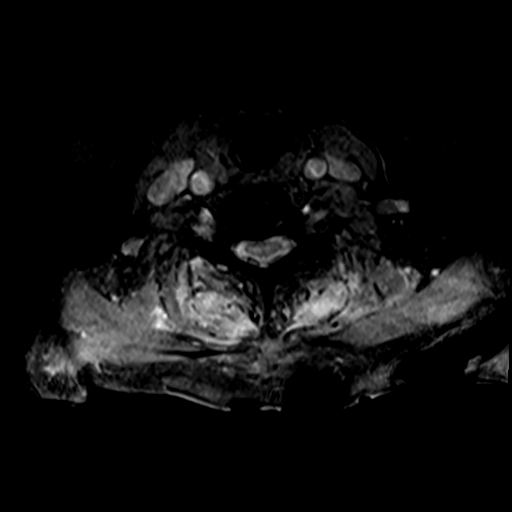
[im 17/40]
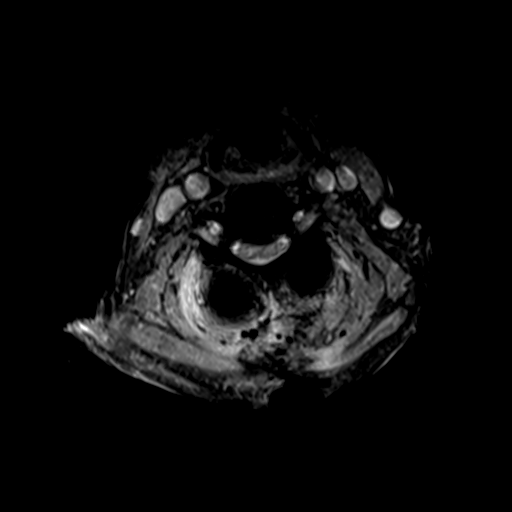
[im 23/40]
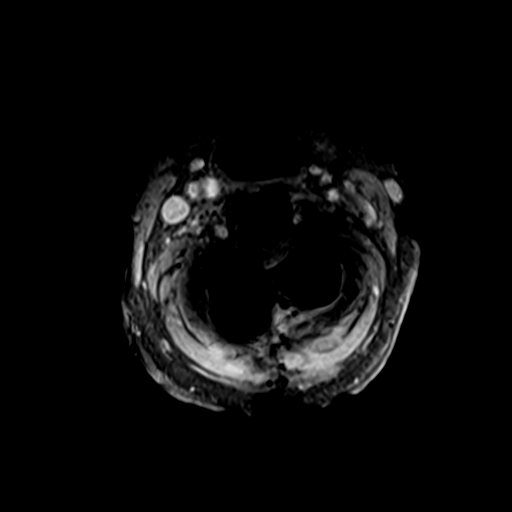
[im 28/40]
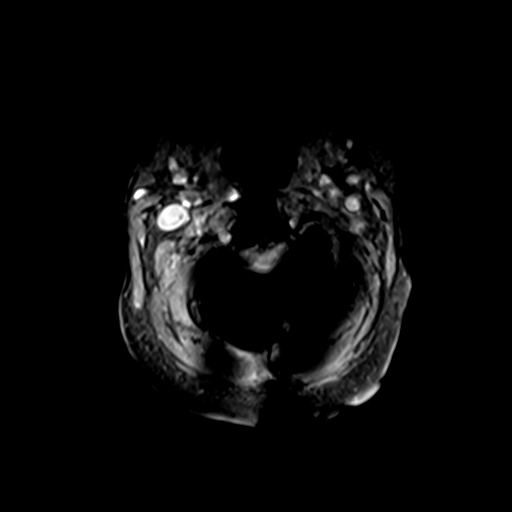
[im 34/40]
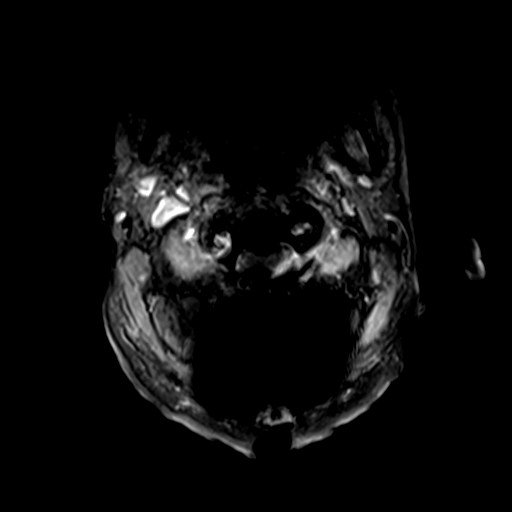
[im 40/40]
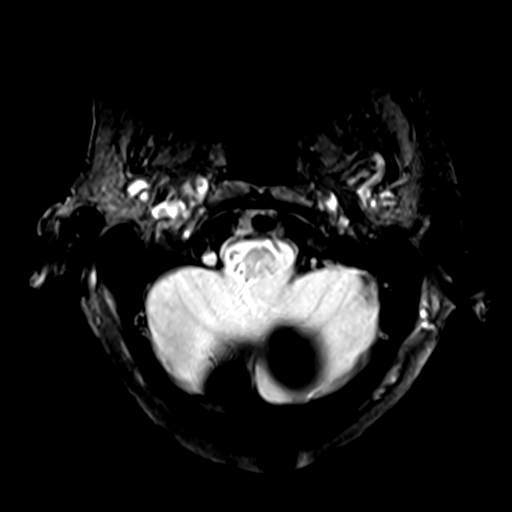

[35 of 48 positions shown; findings below may reference images not displayed]

FINDINGS: Alignment: 5 mm anterolisthesis of C1 and the fracture fragment of
the dens relative to the body of C2 as shown on the postsurgical CT.
Straightening of the normal cervical lordosis below that. 2 mm
degenerative anterolisthesis C7-T1 as seen previously

Vertebrae: There has been interval laminectomy C1. Interval occiput
put to C4 fusion procedure with occipital screws and posterior
element screws at C2, C3 and C4. It appears that the right C2
laminar screw previously shown in the posterior canal has been
removed. Other hardware appears unchanged as seen with MRI, with
limited spatial resolution. In looking at the CT, it is possible
that the posterior element screws at least on the left at C3 and
possibly on the right may have only minimal purchase. There appears
to be minimal purchase of the right screw at C4.

Cord: Continued spinal stenosis at the foramen magnum and C1-2 with
AP diameter of the canal as narrow as 3.4 mm, effacement of the
subarachnoid space and flattening of the cord with low level T2
signal in the cord. Spinal stenosis also in the region from C2
through C6-7 with effacement of the subarachnoid space and some
flattening of the cord.

Posterior Fossa, vertebral arteries, paraspinal tissues: Otherwise
negative

Disc levels:

As noted above, there is persistent spinal stenosis at the foramen
magnum/C1-2 level with AP diameter of the canal as narrow as 3.4 mm
and cord flattening with some abnormal T2 signal in the cord.
Stenosis at C2-3 shows AP canal dimension of 7.5 mm. Stenosis at
C3-4 shows AP canal diameter of 5.5 mm. Stenosis at C4-5 shows AP
canal diameter of 5.5 mm. C5-6 shows AP canal diameter 7 mm. C6-7
shows AP canal diameter of 6.2 mm. Stenosis at C7-T1 shows AP
diameter of 6.3 mm.
IMPRESSION: Apparent interval removal the right laminar screw at C2 which
appeared to intrude upon the canal.

Continued 5 mm anterolisthesis of C1 and the nonunited dens fragment
relative to the body of C2. Continued canal stenosis with AP
diameter of the canal as narrow as 3.4 mm, flattening of the cord
and abnormal T2 cord signal. In this patient has had interval
laminectomy at C1, it is difficult to explain how the AP diameter of
the canal actually measures smaller than on the pre-surgical exam.
This could relate to postoperative edema the posterior tissues.

Lesser but potentially significant stenosis from C2-3 through C7-T1
as outlined above.

## 2020-02-24 ENCOUNTER — Ambulatory Visit (INDEPENDENT_AMBULATORY_CARE_PROVIDER_SITE_OTHER): Payer: Medicare Other | Admitting: *Deleted

## 2020-02-24 DIAGNOSIS — I471 Supraventricular tachycardia: Secondary | ICD-10-CM

## 2020-02-24 LAB — CUP PACEART REMOTE DEVICE CHECK
Battery Remaining Longevity: 175 mo
Battery Voltage: 3.15 V
Brady Statistic AP VP Percent: 0.01 %
Brady Statistic AP VS Percent: 4.53 %
Brady Statistic AS VP Percent: 0.07 %
Brady Statistic AS VS Percent: 95.4 %
Brady Statistic RA Percent Paced: 4.49 %
Brady Statistic RV Percent Paced: 0.08 %
Date Time Interrogation Session: 20210518023907
Implantable Lead Implant Date: 20200814
Implantable Lead Implant Date: 20200814
Implantable Lead Location: 753859
Implantable Lead Location: 753860
Implantable Lead Model: 5076
Implantable Lead Model: 5076
Implantable Pulse Generator Implant Date: 20200814
Lead Channel Impedance Value: 323 Ohm
Lead Channel Impedance Value: 323 Ohm
Lead Channel Impedance Value: 361 Ohm
Lead Channel Impedance Value: 361 Ohm
Lead Channel Pacing Threshold Amplitude: 0.5 V
Lead Channel Pacing Threshold Amplitude: 1 V
Lead Channel Pacing Threshold Pulse Width: 0.4 ms
Lead Channel Pacing Threshold Pulse Width: 0.4 ms
Lead Channel Sensing Intrinsic Amplitude: 0.75 mV
Lead Channel Sensing Intrinsic Amplitude: 0.75 mV
Lead Channel Sensing Intrinsic Amplitude: 14.25 mV
Lead Channel Sensing Intrinsic Amplitude: 14.25 mV
Lead Channel Setting Pacing Amplitude: 1.5 V
Lead Channel Setting Pacing Amplitude: 2.5 V
Lead Channel Setting Pacing Pulse Width: 0.4 ms
Lead Channel Setting Sensing Sensitivity: 2 mV

## 2020-02-26 NOTE — Progress Notes (Signed)
Remote pacemaker transmission.   

## 2020-03-02 ENCOUNTER — Other Ambulatory Visit: Payer: Self-pay | Admitting: Physical Medicine & Rehabilitation

## 2020-03-04 ENCOUNTER — Encounter: Payer: Self-pay | Admitting: Family Medicine

## 2020-03-04 ENCOUNTER — Ambulatory Visit (INDEPENDENT_AMBULATORY_CARE_PROVIDER_SITE_OTHER): Payer: Medicare Other | Admitting: Family Medicine

## 2020-03-04 ENCOUNTER — Ambulatory Visit: Payer: Medicare Other | Admitting: Family Medicine

## 2020-03-04 ENCOUNTER — Other Ambulatory Visit: Payer: Self-pay

## 2020-03-04 VITALS — BP 154/99 | HR 99 | Wt 144.0 lb

## 2020-03-04 DIAGNOSIS — G909 Disorder of the autonomic nervous system, unspecified: Secondary | ICD-10-CM

## 2020-03-04 MED ORDER — LEVETIRACETAM 250 MG PO TABS: 250.0000 mg | ORAL_TABLET | Freq: Two times a day (BID) | ORAL | 0 refills | Status: DC

## 2020-03-04 NOTE — Patient Instructions (Signed)
We will decrease levetiracetam dose to 250mg  twice daily for 2 weeks. If no seizure concerns, decrease levetiracetam to 250mg  daily for two weeks. If you continue to do well, stop levetiracetam. I advise driving restrictions for 6 months while weaning levetiracetam.   Try to increase water intake to 50-60 ounces daily. Eat regular, well balanced meals.   Follow up with me 6 months    Seizure, Adult A seizure is a sudden burst of abnormal electrical activity in the brain. Seizures usually last from 30 seconds to 2 minutes. They can cause many different symptoms. Usually, seizures are not harmful unless they last a long time. What are the causes? Common causes of this condition include:  Fever or infection.  Conditions that affect the brain, such as: ? A brain abnormality that you were born with. ? A brain or head injury. ? Bleeding in the brain. ? A tumor. ? Stroke. ? Brain disorders such as autism or cerebral palsy.  Low blood sugar.  Conditions that are passed from parent to child (are inherited).  Problems with substances, such as: ? Having a reaction to a drug or a medicine. ? Suddenly stopping the use of a substance (withdrawal). In some cases, the cause may not be known. A person who has repeated seizures over time without a clear cause has a condition called epilepsy. What increases the risk? You are more likely to get this condition if you have:  A family history of epilepsy.  Had a seizure in the past.  A brain disorder.  A history of head injury, lack of oxygen at birth, or strokes. What are the signs or symptoms? There are many types of seizures. The symptoms vary depending on the type of seizure you have. Examples of symptoms during a seizure include:  Shaking (convulsions).  Stiffness in the body.  Passing out (losing consciousness).  Head nodding.  Staring.  Not responding to sound or touch.  Loss of bladder control and bowel control. Some  people have symptoms right before and right after a seizure happens. Symptoms before a seizure may include:  Fear.  Worry (anxiety).  Feeling like you may vomit (nauseous).  Feeling like the room is spinning (vertigo).  Feeling like you saw or heard something before (dj vu).  Odd tastes or smells.  Changes in how you see. You may see flashing lights or spots. Symptoms after a seizure happens can include:  Confusion.  Sleepiness.  Headache.  Weakness on one side of the body. How is this treated? Most seizures will stop on their own in under 5 minutes. In these cases, no treatment is needed. Seizures that last longer than 5 minutes will usually need treatment. Treatment can include:  Medicines given through an IV tube.  Avoiding things that are known to cause your seizures. These can include medicines that you take for another condition.  Medicines to treat epilepsy.  Surgery to stop the seizures. This may be needed if medicines do not help. Follow these instructions at home: Medicines  Take over-the-counter and prescription medicines only as told by your doctor.  Do not eat or drink anything that may keep your medicine from working, such as alcohol. Activity  Do not do any activities that would be dangerous if you had another seizure, like driving or swimming. Wait until your doctor says it is safe for you to do them.  If you live in the [place] ask your local DMV (department of motor vehicles) when you can drive.  Get plenty of rest. Teaching others Teach friends and family what to do when you have a seizure. They should:  Lay you on the ground.  Protect your head and body.  Loosen any tight clothing around your neck.  Turn you on your side.  Not hold you down.  Not put anything into your mouth.  Know whether or not you need emergency care.  Stay with you until you are better.  General instructions  Contact your doctor each time you have a seizure.   Avoid anything that gives you seizures.  Keep a seizure diary. Write down: ? What you think caused each seizure. ? What you remember about each seizure.  Keep all follow-up visits as told by your doctor. This is important. Contact a doctor if:  You have another seizure.  You have seizures more often.  There is any change in what happens during your seizures.  You keep having seizures with treatment.  You have symptoms of being sick or having an infection. Get help right away if:  You have a seizure that: ? Lasts longer than 5 minutes. ? Is different than seizures you had before. ? Makes it harder to breathe. ? Happens after you hurt your head.  You have any of these symptoms after a seizure: ? Not being able to speak. ? Not being able to use a part of your body. ? Confusion. ? A bad headache.  You have two or more seizures in a row.  You do not wake up right after a seizure.  You get hurt during a seizure. These symptoms may be an emergency. Do not wait to see if the symptoms will go away. Get medical help right away. Call your local emergency services (911 in the [place] Do not drive yourself to the hospital. Summary  Seizures usually last from 30 seconds to 2 minutes. Usually, they are not harmful unless they last a long time.  Do not eat or drink anything that may keep your medicine from working, such as alcohol.  Teach friends and family what to do when you have a seizure.  Contact your doctor each time you have a seizure. This information is not intended to replace advice given to you by your health care provider. Make sure you discuss any questions you have with your health care provider. Document Revised: 12/13/2018 Document Reviewed: 12/13/2018 Elsevier Patient Education  2020 [employer].

## 2020-03-04 NOTE — Progress Notes (Signed)
PATIENT: Denise Espinoza DOB: 12/08/1929  REASON FOR VISIT: follow up HISTORY FROM: patient  Chief Complaint  Patient presents with  . Follow-up    Rm 2 here for a f/u in epilepsy. Pt is not having any new sx.     HISTORY OF PRESENT ILLNESS: Today 03/04/20 Denise Espinoza is a 86 y.o. female here today for follow up for questionable seizure diagnosis. She has continues levetiracetam 500mg  twice daily and is tolerating it well. She has not had any difficulty with speech changes or other seizure like activity since pacemaker placement in 05/2019. She was started on AED empirically following word finding difficulties last year. She does continue to have orthostatic dizziness but states that it is intermittent and mild. She is very active. She lives with her granddaughter. She does drive without difficulty.   HISTORY: (copied from Dr Gladstone Lighter note on 07/09/2019)  86 year old female here for evaluation of seizure.  History of C2 fracture (displaced type 2 dens fracture) and myelopathy in March 2020 status post laminectomy and fusion (ORIF) in April 2020.  Thereafter patient started to have intermittent episodes of dizziness and lightheadedness.  In August 2020 patient had episode of word finding difficulties and was taken to the hospital for stroke evaluation.  No acute findings on MRI of the brain.  Possibility of seizure was raised and EEG was unremarkable.  Patient was empirically started on Keppra for seizure prevention.  Patient was also found to have orthostatic hypotension, dizziness, syncope associated with 2 to 3-second pause on telemetry.  Ultimately patient was treated with dual-chamber pacemaker placement on 05/23/2019.  Since that time no further spells or events.  Tolerating levetiracetam.  REVIEW OF SYSTEMS: Out of a complete 14 system review of symptoms, the patient complains only of the following symptoms, dizziness, and all other reviewed systems are  negative.  ALLERGIES: Allergies  Allergen Reactions  . Penicillins Hives, Swelling, Rash and Other (See Comments)    Did it involve swelling of the face/tongue/throat, SOB, or low BP? #  #  #  YES  #  #  #  Did it involve sudden or severe rash/hives, skin peeling, or any reaction on the inside of your mouth or nose? No Did you need to seek medical attention at a hospital or doctor's office? #  #  #  YES  #  #  #  When did it last happen?86 years old.   . Codeine Phosphate [Codeine] Other (See Comments)    headache    HOME MEDICATIONS: Outpatient Medications Prior to Visit  Medication Sig Dispense Refill  . acetaminophen (TYLENOL) 325 MG tablet Take 1-2 tablets (325-650 mg total) by mouth every 4 (four) hours as needed for mild pain.    Marland Kitchen Dextran 70-Hypromellose (ARTIFICIAL TEARS) 0.1-0.3 % SOLN Place 1 drop into both eyes 3 (three) times daily as needed.    . gabapentin (NEURONTIN) 300 MG capsule Take 1 capsule (300 mg total) by mouth 3 (three) times daily. 90 capsule 5  . mirtazapine (REMERON) 15 MG tablet Take 15 mg by mouth at bedtime.    . Multiple Vitamin (MULTIVITAMIN) capsule Take 1 capsule by mouth daily.    . NON FORMULARY Diet Type: Regular    . Nutritional Supplements (ENSURE ENLIVE PO) Take 1 Bottle by mouth daily. Prefers Vanilla    . polyethylene glycol (MIRALAX / GLYCOLAX) 17 g packet Take 17 g by mouth daily.    . rosuvastatin (CRESTOR) 5 MG tablet Take 1 tablet (5  mg total) by mouth daily. 30 tablet 0  . sennosides-docusate sodium (SENOKOT-S) 8.6-50 MG tablet Take 2 tablets by mouth 2 (two) times daily as needed for constipation.    . traMADol (ULTRAM) 50 MG tablet Take 50 mg by mouth 3 (three) times daily as needed.    . levETIRAcetam (KEPPRA) 500 MG tablet Take 1 tablet (500 mg total) by mouth 2 (two) times daily. 180 tablet 4  . pantoprazole (PROTONIX) 40 MG tablet Take 1 tablet (40 mg total) by mouth 2 (two) times daily. 60 tablet 0   No facility-administered  medications prior to visit.    PAST MEDICAL HISTORY: Past Medical History:  Diagnosis Date  . Allergic rhinitis   . Anosmia   . Arthritis   . Full dentures   . History of diverticulitis   . Insomnia   . Seizure (HCC) 2020  . Senile osteoporosis   . SOB (shortness of breath)   . SVT (supraventricular tachycardia) (HCC)   . Varicose vein of leg    left  . Wears glasses     PAST SURGICAL HISTORY: Past Surgical History:  Procedure Laterality Date  . COLON RESECTION  1989   diverticulitis  . COLONOSCOPY    . ESOPHAGEAL DILATION  2015   endoscopy  . PACEMAKER IMPLANT N/A 05/23/2019   Procedure: PACEMAKER IMPLANT;  Surgeon: Regan Lemming, MD;  [facility]: [facility];  Service: Cardiovascular;  Laterality: N/A;  . POSTERIOR CERVICAL FUSION/FORAMINOTOMY N/A 02/11/2019   Procedure: POSTERIOR CERVICAL INSTRUMENTATION REMOVAL CERVICAL TWO;  Surgeon: Lisbeth Renshaw, MD;  Location: [facility] OR;  Service: Neurosurgery;  Laterality: N/A;  . POSTERIOR CERVICAL LAMINECTOMY N/A 02/06/2019   Procedure: Cervical one LAMINECTOMY, Occiput-Cervical four fusion;  Surgeon: Lisbeth Renshaw, MD;  Location: [facility] OR;  Service: Neurosurgery;  Laterality: N/A;    FAMILY HISTORY: Family History  Problem Relation Age of Onset  . Cancer Mother   . High blood pressure Father   . Emphysema Brother     SOCIAL HISTORY: Social History   Socioeconomic History  . Marital status: Widowed    Spouse name: Not on file  . Number of children: 2  . Years of education: 68  . Highest education level: Not on file  Occupational History  . Occupation: Retired  Tobacco Use  . Smoking status: Never Smoker  . Smokeless tobacco: Never Used  Substance and Sexual Activity  . Alcohol use: Yes    Comment: rare  . Drug use: No  . Sexual activity: Not on file  Other Topics Concern  . Not on file  Social History Narrative   Lives w/grand dgtr, son   Caffeine- coffee 1 cup daily   Social Determinants of  Health   Financial Resource Strain:   . Difficulty of Paying Living Expenses:   Food Insecurity:   . Worried About Programme researcher, broadcasting/film/video in the Last Year:   . Barista in the Last Year:   Transportation Needs:   . Freight forwarder (Medical):   Marland Kitchen Lack of Transportation (Non-Medical):   Physical Activity:   . Days of Exercise per Week:   . Minutes of Exercise per Session:   Stress:   . Feeling of Stress :   Social Connections:   . Frequency of Communication with Friends and Family:   . Frequency of Social Gatherings with Friends and Family:   . Attends Religious Services:   . Active Member of Clubs or Organizations:   . Attends Club  or Organization Meetings:   Marland Kitchen Marital Status:   Intimate Partner Violence:   . Fear of Current or Ex-Partner:   . Emotionally Abused:   Marland Kitchen Physically Abused:   . Sexually Abused:       PHYSICAL EXAM  Vitals:   03/04/20 0937  BP: (!) 154/99  Pulse: 99  Weight: 144 lb (65.3 kg)   Body mass index is 24.72 kg/m.  Generalized: Well developed, in no acute distress  Cardiology: normal rate and rhythm, no murmur noted Respiratory: clear to auscultation bilaterally  Neurological examination  Mentation: Alert oriented to time, place, history taking. Follows all commands speech and language fluent Cranial nerve II-XII: Pupils were equal round reactive to light. Extraocular movements were full, visual field were full Motor: The motor testing reveals 5 over 5 strength of all 4 extremities. Good symmetric motor tone is noted throughout.  Gait and station: Gait is normal.   DIAGNOSTIC DATA (LABS, IMAGING, TESTING) - I reviewed patient records, labs, notes, testing and imaging myself where available.  No flowsheet data found.   Lab Results  Component Value Date   WBC 3.7 (L) 05/18/2019   HGB 11.9 (L) 05/18/2019   HCT 37.2 05/18/2019   MCV 91.6 05/18/2019   PLT 189 05/18/2019      Component Value Date/Time   NA 143 05/19/2019 0619    K 3.8 05/19/2019 0619   CL 109 05/19/2019 0619   CO2 25 05/19/2019 0619   GLUCOSE 97 05/19/2019 0619   BUN 16 05/19/2019 0619   CREATININE 0.65 05/19/2019 0619   CALCIUM 8.9 05/19/2019 0619   PROT 7.3 05/17/2019 1533   ALBUMIN 4.0 05/17/2019 1533   AST 20 05/17/2019 1533   ALT 10 05/17/2019 1533   ALKPHOS 91 05/17/2019 1533   BILITOT 0.9 05/17/2019 1533   GFRNONAA >60 05/19/2019 0619   GFRAA >60 05/19/2019 0619   Lab Results  Component Value Date   CHOL 148 05/18/2019   HDL 58 05/18/2019   LDLCALC 76 05/18/2019   TRIG 70 05/18/2019   CHOLHDL 2.6 05/18/2019   Lab Results  Component Value Date   HGBA1C 5.2 05/18/2019   No results found for: VITAMINB12 Lab Results  Component Value Date   TSH 0.659 05/22/2019       ASSESSMENT AND PLAN 86 y.o. year old female  has a past medical history of Allergic rhinitis, Anosmia, Arthritis, Full dentures, History of diverticulitis, Insomnia, Seizure (HCC) (2020), Senile osteoporosis, SOB (shortness of breath), SVT (supraventricular tachycardia) (HCC), Varicose vein of leg, and Wears glasses. here with     ICD-10-CM   1. Autonomic dysfunction  G90.9     Mrs Millett reports that she is doing well today. She has not had any concerns of seizure like activity, specifically word finding difficulty, since dual chamber pacemaker placement in 05/2019. She has tolerated levetiracetam but does wish to wean this medication. No previous history of seizures. We will reduce dose to 250mg  twice daily for two weeks, then 250mg  for 2 weeks, then discontinue. She will monitor symptoms closely and call me with any concerns. She was advised not to drive until reevaluated in 6 months. She verbalizes understanding and agreement with this plan.   No orders of the defined types were placed in this encounter.    Meds ordered this encounter  Medications  . levETIRAcetam (KEPPRA) 250 MG tablet    Sig: Take 1 tablet (250 mg total) by mouth 2 (two) times daily.     Dispense:  60 tablet    Refill:  0    Order Specific Question:   Supervising Provider    Answer:   Anson Fret [id number]      I spent 20 minutes with the patient. 50% of this time was spent counseling and educating patient on plan of care and medications.    Shawnie Dapper, FNP-C 03/04/2020, 10:37 AM [facility] [address], [address] [place], [place] [address] [phone]

## 2020-03-11 NOTE — Progress Notes (Signed)
I reviewed note and agree with plan.   Anberlin Diez R. Mikiya Nebergall, MD 03/11/2020, 4:27 PM Certified in Neurology, Neurophysiology and Neuroimaging  [facility] [address], [address] [place], [place] [address] [phone]  

## 2020-03-22 ENCOUNTER — Other Ambulatory Visit: Payer: Self-pay | Admitting: Physical Medicine & Rehabilitation

## 2020-05-25 ENCOUNTER — Ambulatory Visit (INDEPENDENT_AMBULATORY_CARE_PROVIDER_SITE_OTHER): Payer: Medicare Other | Admitting: *Deleted

## 2020-05-25 DIAGNOSIS — R55 Syncope and collapse: Secondary | ICD-10-CM

## 2020-05-25 LAB — CUP PACEART REMOTE DEVICE CHECK
Battery Remaining Longevity: 171 mo
Battery Voltage: 3.11 V
Brady Statistic AP VP Percent: 0.01 %
Brady Statistic AP VS Percent: 9.79 %
Brady Statistic AS VP Percent: 0.05 %
Brady Statistic AS VS Percent: 90.14 %
Brady Statistic RA Percent Paced: 9.74 %
Brady Statistic RV Percent Paced: 0.07 %
Date Time Interrogation Session: 20210817025836
Implantable Lead Implant Date: 20200814
Implantable Lead Implant Date: 20200814
Implantable Lead Location: 753859
Implantable Lead Location: 753860
Implantable Lead Model: 5076
Implantable Lead Model: 5076
Implantable Pulse Generator Implant Date: 20200814
Lead Channel Impedance Value: 323 Ohm
Lead Channel Impedance Value: 361 Ohm
Lead Channel Impedance Value: 361 Ohm
Lead Channel Impedance Value: 418 Ohm
Lead Channel Pacing Threshold Amplitude: 0.5 V
Lead Channel Pacing Threshold Amplitude: 0.875 V
Lead Channel Pacing Threshold Pulse Width: 0.4 ms
Lead Channel Pacing Threshold Pulse Width: 0.4 ms
Lead Channel Sensing Intrinsic Amplitude: 0.75 mV
Lead Channel Sensing Intrinsic Amplitude: 0.75 mV
Lead Channel Sensing Intrinsic Amplitude: 14.125 mV
Lead Channel Sensing Intrinsic Amplitude: 14.125 mV
Lead Channel Setting Pacing Amplitude: 1.5 V
Lead Channel Setting Pacing Amplitude: 2.5 V
Lead Channel Setting Pacing Pulse Width: 0.4 ms
Lead Channel Setting Sensing Sensitivity: 2 mV

## 2020-05-27 NOTE — Progress Notes (Signed)
Remote pacemaker transmission.   

## 2020-05-29 IMAGING — CR CHEST - 2 VIEW
2 series · 2 of 2 positions shown · non-contrast
Comparison: 05/17/2019

CLINICAL DATA: Postop from pacemaker placement.

EXAM:
CHEST - 2 VIEW

[chest lat]
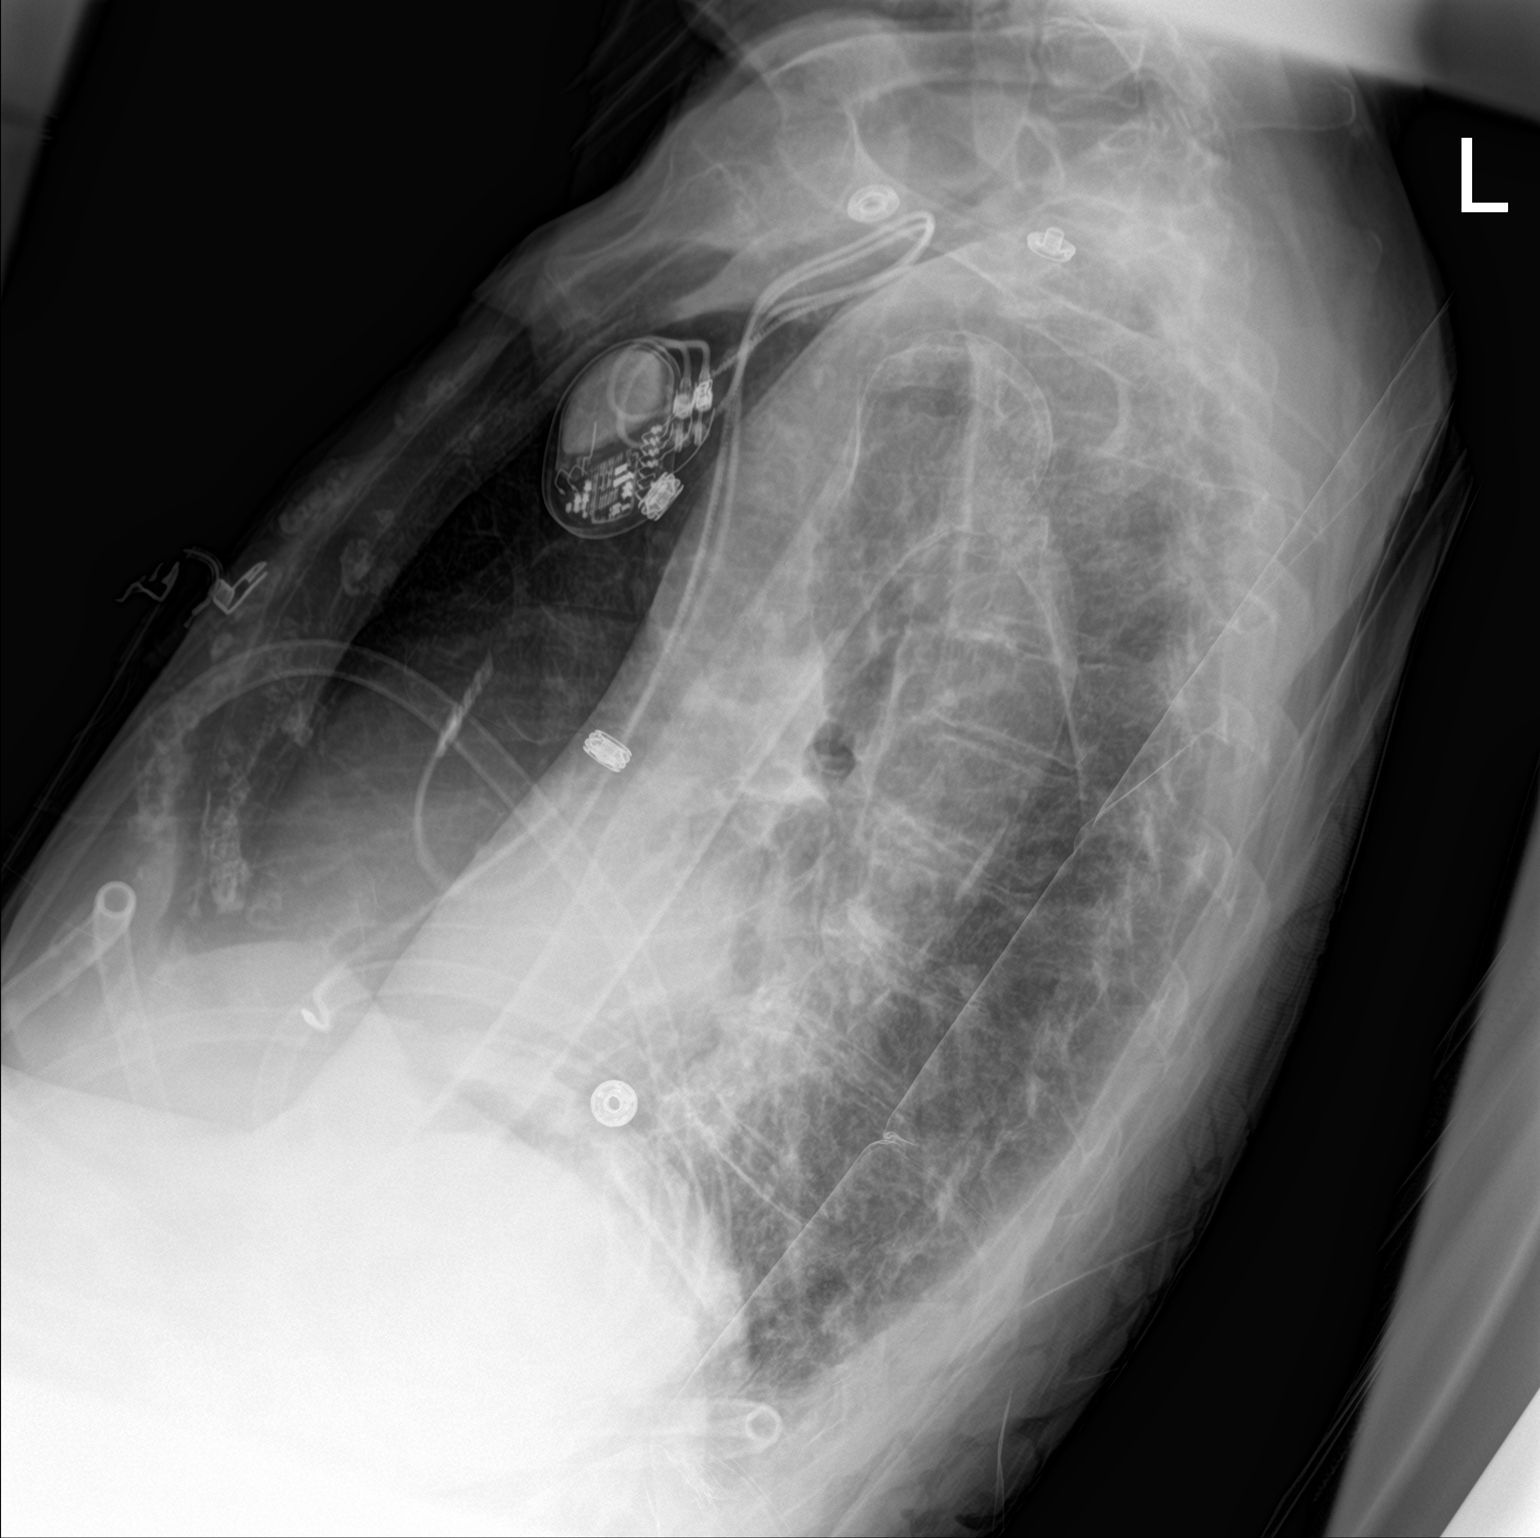

[chest ap]
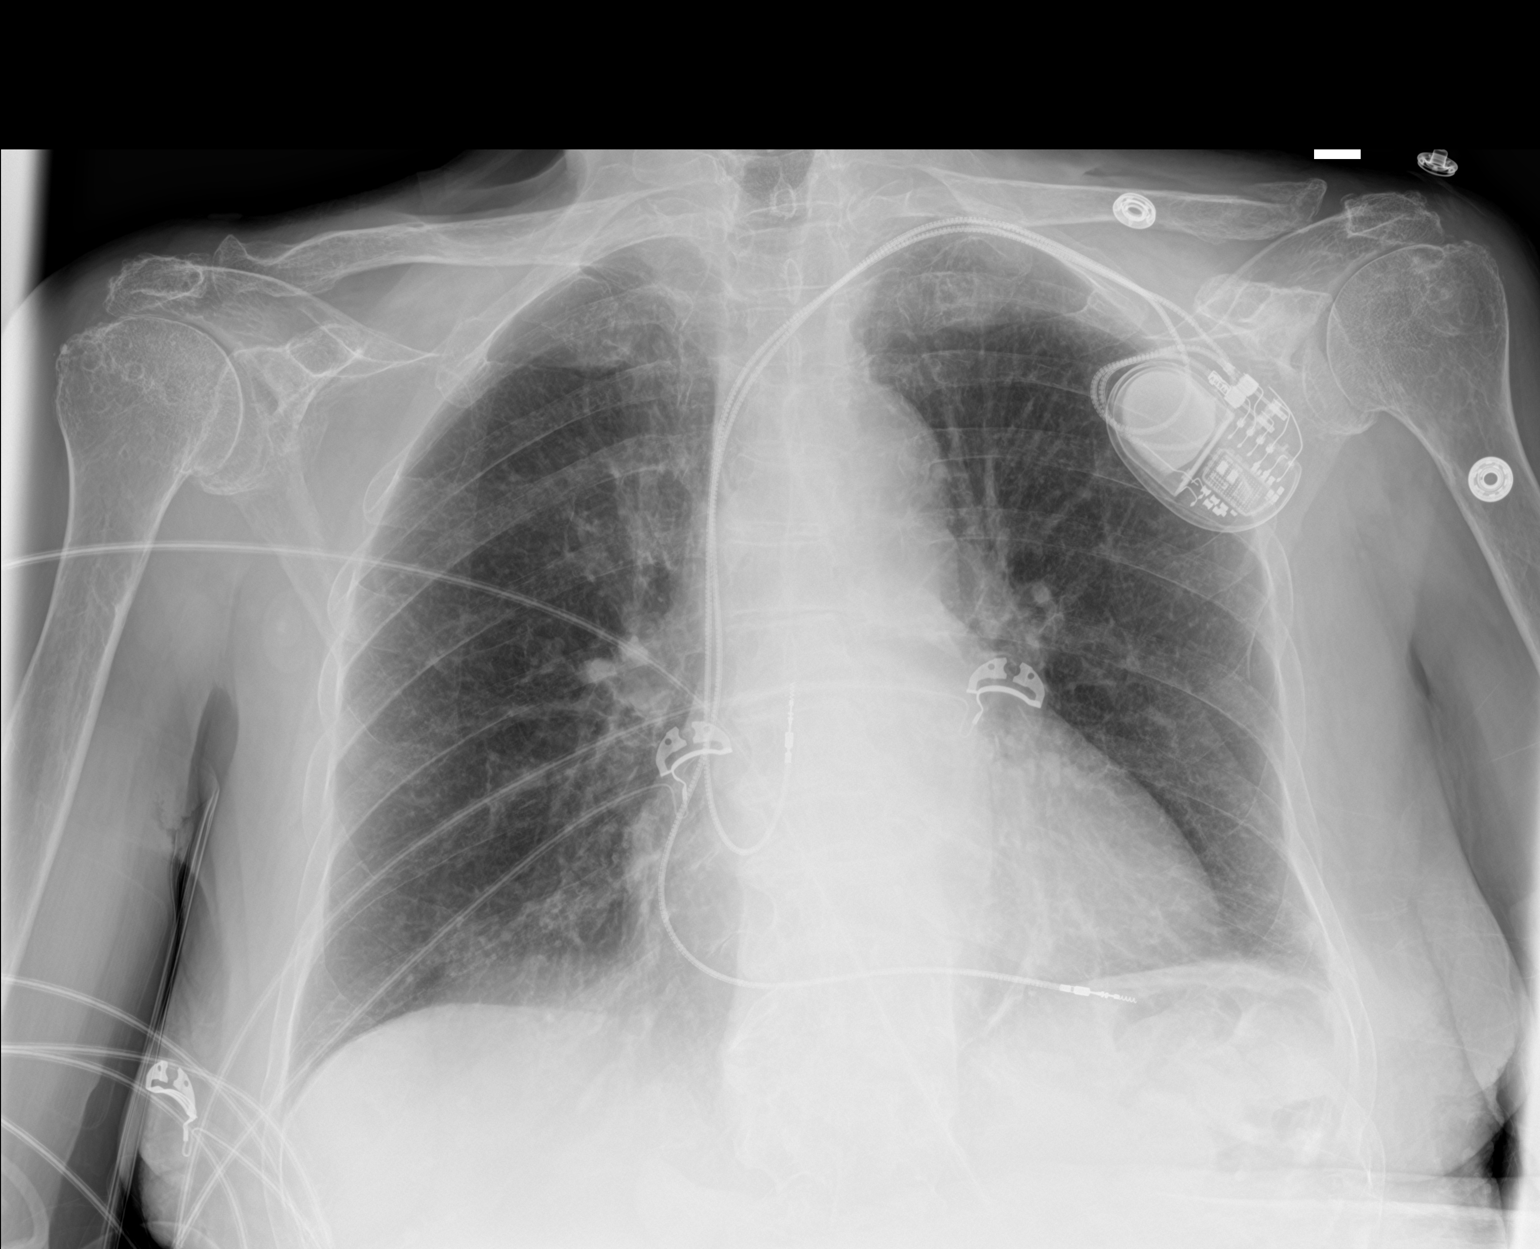

[2 of 2 positions shown; findings below may reference images not displayed]

FINDINGS: New dual lead transvenous pacemaker seen with leads overlying the
right atrium and right ventricle. No evidence of pneumothorax or
pleural effusion. Both lungs are clear. Heart size is normal.
IMPRESSION: New transvenous pacemaker in appropriate position. No evidence of
pneumothorax or other acute findings.

## 2020-06-08 ENCOUNTER — Other Ambulatory Visit: Payer: Self-pay | Admitting: Physical Medicine & Rehabilitation

## 2020-06-10 ENCOUNTER — Other Ambulatory Visit: Payer: Self-pay | Admitting: Physical Medicine & Rehabilitation

## 2020-08-24 ENCOUNTER — Ambulatory Visit (INDEPENDENT_AMBULATORY_CARE_PROVIDER_SITE_OTHER): Payer: Medicare Other

## 2020-08-24 DIAGNOSIS — I471 Supraventricular tachycardia: Secondary | ICD-10-CM

## 2020-08-24 LAB — CUP PACEART REMOTE DEVICE CHECK
Battery Remaining Longevity: 168 mo
Battery Voltage: 3.08 V
Brady Statistic AP VP Percent: 0.02 %
Brady Statistic AP VS Percent: 15.69 %
Brady Statistic AS VP Percent: 0.06 %
Brady Statistic AS VS Percent: 84.23 %
Brady Statistic RA Percent Paced: 15.57 %
Brady Statistic RV Percent Paced: 0.08 %
Date Time Interrogation Session: 20211115210721
Implantable Lead Implant Date: 20200814
Implantable Lead Implant Date: 20200814
Implantable Lead Location: 753859
Implantable Lead Location: 753860
Implantable Lead Model: 5076
Implantable Lead Model: 5076
Implantable Pulse Generator Implant Date: 20200814
Lead Channel Impedance Value: 323 Ohm
Lead Channel Impedance Value: 361 Ohm
Lead Channel Impedance Value: 380 Ohm
Lead Channel Impedance Value: 418 Ohm
Lead Channel Pacing Threshold Amplitude: 0.625 V
Lead Channel Pacing Threshold Amplitude: 1 V
Lead Channel Pacing Threshold Pulse Width: 0.4 ms
Lead Channel Pacing Threshold Pulse Width: 0.4 ms
Lead Channel Sensing Intrinsic Amplitude: 0.875 mV
Lead Channel Sensing Intrinsic Amplitude: 0.875 mV
Lead Channel Sensing Intrinsic Amplitude: 14.125 mV
Lead Channel Sensing Intrinsic Amplitude: 14.125 mV
Lead Channel Setting Pacing Amplitude: 1.5 V
Lead Channel Setting Pacing Amplitude: 2.5 V
Lead Channel Setting Pacing Pulse Width: 0.4 ms
Lead Channel Setting Sensing Sensitivity: 2 mV

## 2020-08-26 NOTE — Progress Notes (Signed)
Remote pacemaker transmission.   

## 2020-09-06 ENCOUNTER — Encounter: Payer: Self-pay | Admitting: Family Medicine

## 2020-09-06 ENCOUNTER — Ambulatory Visit: Payer: Medicare Other | Admitting: Family Medicine

## 2020-10-13 ENCOUNTER — Other Ambulatory Visit: Payer: Self-pay

## 2020-10-13 ENCOUNTER — Ambulatory Visit (INDEPENDENT_AMBULATORY_CARE_PROVIDER_SITE_OTHER): Payer: Medicare Other | Admitting: Internal Medicine

## 2020-10-13 VITALS — BP 126/80 | HR 85 | Ht 63.0 in | Wt 146.6 lb

## 2020-10-13 DIAGNOSIS — I495 Sick sinus syndrome: Secondary | ICD-10-CM

## 2020-10-13 DIAGNOSIS — I951 Orthostatic hypotension: Secondary | ICD-10-CM | POA: Diagnosis not present

## 2020-10-13 DIAGNOSIS — Z950 Presence of cardiac pacemaker: Secondary | ICD-10-CM | POA: Diagnosis not present

## 2020-10-13 LAB — CUP PACEART INCLINIC DEVICE CHECK
Battery Remaining Longevity: 166 mo
Battery Voltage: 3.07 V
Brady Statistic AP VP Percent: 0.02 %
Brady Statistic AP VS Percent: 9.57 %
Brady Statistic AS VP Percent: 0.06 %
Brady Statistic AS VS Percent: 90.35 %
Brady Statistic RA Percent Paced: 9.48 %
Brady Statistic RV Percent Paced: 0.08 %
Date Time Interrogation Session: 20220105132848
Implantable Lead Implant Date: 20200814
Implantable Lead Implant Date: 20200814
Implantable Lead Location: 753859
Implantable Lead Location: 753860
Implantable Lead Model: 5076
Implantable Lead Model: 5076
Implantable Pulse Generator Implant Date: 20200814
Lead Channel Impedance Value: 361 Ohm
Lead Channel Impedance Value: 380 Ohm
Lead Channel Impedance Value: 418 Ohm
Lead Channel Impedance Value: 456 Ohm
Lead Channel Pacing Threshold Amplitude: 0.5 V
Lead Channel Pacing Threshold Amplitude: 1 V
Lead Channel Pacing Threshold Pulse Width: 0.4 ms
Lead Channel Pacing Threshold Pulse Width: 0.4 ms
Lead Channel Sensing Intrinsic Amplitude: 1.3 mV
Lead Channel Sensing Intrinsic Amplitude: 20 mV
Lead Channel Setting Pacing Amplitude: 1.5 V
Lead Channel Setting Pacing Amplitude: 2.5 V
Lead Channel Setting Pacing Pulse Width: 0.4 ms
Lead Channel Setting Sensing Sensitivity: 2 mV

## 2020-10-13 NOTE — Progress Notes (Signed)
HPI Ms. Nolet returns today for followup of her PPM. She is a pleasant 87 yo woman with sinus node dysfunction, HTN and orthostasis. When I saw her last she noted that her dizzy spells and improved after her PPM insertion. She now denies dizziness. No chest pain or sob. No syncope.  Allergies  Allergen Reactions  . Penicillins Hives, Swelling, Rash and Other (See Comments)    Did it involve swelling of the face/tongue/throat, SOB, or low BP? #  #  #  YES  #  #  #  Did it involve sudden or severe rash/hives, skin peeling, or any reaction on the inside of your mouth or nose? No Did you need to seek medical attention at a hospital or doctor's office? #  #  #  YES  #  #  #  When did it last happen?87 years old.   . Codeine Phosphate [Codeine] Other (See Comments)    headache     Current Outpatient Medications  Medication Sig Dispense Refill  . acetaminophen (TYLENOL) 325 MG tablet Take 1-2 tablets (325-650 mg total) by mouth every 4 (four) hours as needed for mild pain.    Marland Kitchen Dextran 70-Hypromellose (ARTIFICIAL TEARS) 0.1-0.3 % SOLN Place 1 drop into both eyes 3 (three) times daily as needed.    . gabapentin (NEURONTIN) 300 MG capsule TAKE 1 CAPSULE BY MOUTH THREE TIMES DAILY 90 capsule 0  . levETIRAcetam (KEPPRA) 250 MG tablet Take 1 tablet (250 mg total) by mouth 2 (two) times daily. 60 tablet 0  . mirtazapine (REMERON) 15 MG tablet Take 15 mg by mouth at bedtime.    . Multiple Vitamin (MULTIVITAMIN) capsule Take 1 capsule by mouth daily.    . NON FORMULARY Diet Type: Regular    . Nutritional Supplements (ENSURE ENLIVE PO) Take 1 Bottle by mouth daily. Prefers Vanilla    . pantoprazole (PROTONIX) 40 MG tablet Take 40 mg by mouth daily.    . rosuvastatin (CRESTOR) 5 MG tablet Take 1 tablet (5 mg total) by mouth daily. 30 tablet 0  . sennosides-docusate sodium (SENOKOT-S) 8.6-50 MG tablet Take 2 tablets by mouth 2 (two) times daily as needed for constipation.    . traMADol  (ULTRAM) 50 MG tablet Take 50 mg by mouth 3 (three) times daily as needed.     No current facility-administered medications for this visit.     Past Medical History:  Diagnosis Date  . Allergic rhinitis   . Anosmia   . Arthritis   . Full dentures   . History of diverticulitis   . Insomnia   . Seizure (HCC) 2020  . Senile osteoporosis   . SOB (shortness of breath)   . SVT (supraventricular tachycardia) (HCC)   . Varicose vein of leg    left  . Wears glasses     ROS:   All systems reviewed and negative except as noted in the HPI.   Past Surgical History:  Procedure Laterality Date  . COLON RESECTION  1989   diverticulitis  . COLONOSCOPY    . ESOPHAGEAL DILATION  2015   endoscopy  . PACEMAKER IMPLANT N/A 05/23/2019   Procedure: PACEMAKER IMPLANT;  Surgeon: Regan Lemming, MD;  [facility]: [facility];  Service: Cardiovascular;  Laterality: N/A;  . POSTERIOR CERVICAL FUSION/FORAMINOTOMY N/A 02/11/2019   Procedure: POSTERIOR CERVICAL INSTRUMENTATION REMOVAL CERVICAL TWO;  Surgeon: Lisbeth Renshaw, MD;  Location: [facility] OR;  Service: Neurosurgery;  Laterality: N/A;  . POSTERIOR  CERVICAL LAMINECTOMY N/A 02/06/2019   Procedure: Cervical one LAMINECTOMY, Occiput-Cervical four fusion;  Surgeon: Lisbeth Renshaw, MD;  Location: [facility];  Service: Neurosurgery;  Laterality: N/A;     Family History  Problem Relation Age of Onset  . Cancer Mother   . High blood pressure Father   . Emphysema Brother      Social History   Socioeconomic History  . Marital status: Widowed    Spouse name: Not on file  . Number of children: 2  . Years of education: 52  . Highest education level: Not on file  Occupational History  . Occupation: Retired  Tobacco Use  . Smoking status: Never Smoker  . Smokeless tobacco: Never Used  Substance and Sexual Activity  . Alcohol use: Yes    Comment: rare  . Drug use: No  . Sexual activity: Not on file  Other Topics Concern  . Not on  file  Social History Narrative   Lives w/grand dgtr, son   Caffeine- coffee 1 cup daily   Social Determinants of Health   Financial Resource Strain: Not on file  Food Insecurity: Not on file  Transportation Needs: Not on file  Physical Activity: Not on file  Stress: Not on file  Social Connections: Not on file  Intimate Partner Violence: Not on file     BP 126/80   Pulse 85   Ht 5\' 3"  (1.6 m)   Wt 146 lb 9.6 oz (66.5 kg)   SpO2 98%   BMI 25.97 kg/m   Physical Exam:  Well appearing NAD HEENT: Unremarkable Neck:  No JVD, no thyromegally Lymphatics:  No adenopathy Back:  No CVA tenderness Lungs:  Clear with no wheezes HEART:  Regular rate rhythm, no murmurs, no rubs, no clicks Abd:  soft, positive bowel sounds, no organomegally, no rebound, no guarding Ext:  2 plus pulses, no edema, no cyanosis, no clubbing Skin:  No rashes no nodules Neuro:  CN II through XII intact, motor grossly intact  DEVICE  Normal device function.  See PaceArt for details.   Assess/Plan: 1. Sinus node dysfunction - she is asymptomatic, s/p PPM insertion.  2. PPM - her Medtronic DDD PM is working normally and we will recheck in several months 3. Orthostasis - she is off of her florinef and she has no symptoms. 4. HTN - her bp is well controlled. No change in meds.  Binyamin Nelis,MD

## 2020-10-13 NOTE — Patient Instructions (Signed)
Medication Instructions:  Your physician recommends that you continue on your current medications as directed. Please refer to the Current Medication list given to you today.  *If you need a refill on your cardiac medications before your next appointment, please call your pharmacy*   Lab Work: None Today If you have labs (blood work) drawn today and your tests are completely normal, you will receive your results only by: . MyChart Message (if you have MyChart) OR . A paper copy in the mail If you have any lab test that is abnormal or we need to change your treatment, we will call you to review the results.   Testing/Procedures: None Today   Follow-Up: At [facility], you and your health needs are our priority.  As part of our continuing mission to provide you with exceptional heart care, we have created designated Provider Care Teams.  These Care Teams include your primary Cardiologist (physician) and Advanced Practice Providers (APPs -  Physician Assistants and Nurse Practitioners) who all work together to provide you with the care you need, when you need it.  We recommend signing up for the patient portal called "MyChart".  Sign up information is provided on this After Visit Summary.  MyChart is used to connect with patients for Virtual Visits (Telemedicine).  Patients are able to view lab/test results, encounter notes, upcoming appointments, etc.  Non-urgent messages can be sent to your provider as well.   To learn more about what you can do with MyChart, go to [email or web address].    Your next appointment:   12 month(s)  The format for your next appointment:   In Person  Provider:   Gregg Taylor, MD   Other Instructions None Today     

## 2020-11-23 ENCOUNTER — Ambulatory Visit (INDEPENDENT_AMBULATORY_CARE_PROVIDER_SITE_OTHER): Payer: Medicare Other

## 2020-11-23 DIAGNOSIS — I495 Sick sinus syndrome: Secondary | ICD-10-CM | POA: Diagnosis not present

## 2020-11-23 LAB — CUP PACEART REMOTE DEVICE CHECK
Battery Remaining Longevity: 164 mo
Battery Voltage: 3.06 V
Brady Statistic AP VP Percent: 0.27 %
Brady Statistic AP VS Percent: 15.71 %
Brady Statistic AS VP Percent: 0.35 %
Brady Statistic AS VS Percent: 83.67 %
Brady Statistic RA Percent Paced: 15.53 %
Brady Statistic RV Percent Paced: 0.62 %
Date Time Interrogation Session: 20220215033103
Implantable Lead Implant Date: 20200814
Implantable Lead Implant Date: 20200814
Implantable Lead Location: 753859
Implantable Lead Location: 753860
Implantable Lead Model: 5076
Implantable Lead Model: 5076
Implantable Pulse Generator Implant Date: 20200814
Lead Channel Impedance Value: 323 Ohm
Lead Channel Impedance Value: 361 Ohm
Lead Channel Impedance Value: 361 Ohm
Lead Channel Impedance Value: 437 Ohm
Lead Channel Pacing Threshold Amplitude: 0.625 V
Lead Channel Pacing Threshold Amplitude: 1.125 V
Lead Channel Pacing Threshold Pulse Width: 0.4 ms
Lead Channel Pacing Threshold Pulse Width: 0.4 ms
Lead Channel Sensing Intrinsic Amplitude: 0.75 mV
Lead Channel Sensing Intrinsic Amplitude: 0.75 mV
Lead Channel Sensing Intrinsic Amplitude: 14 mV
Lead Channel Sensing Intrinsic Amplitude: 14 mV
Lead Channel Setting Pacing Amplitude: 1.5 V
Lead Channel Setting Pacing Amplitude: 2.5 V
Lead Channel Setting Pacing Pulse Width: 0.4 ms
Lead Channel Setting Sensing Sensitivity: 2 mV

## 2020-11-30 NOTE — Progress Notes (Signed)
Remote pacemaker transmission.   

## 2020-12-23 ENCOUNTER — Ambulatory Visit (INDEPENDENT_AMBULATORY_CARE_PROVIDER_SITE_OTHER): Payer: Medicare Other | Admitting: Family Medicine

## 2020-12-23 ENCOUNTER — Encounter: Payer: Self-pay | Admitting: Family Medicine

## 2020-12-23 ENCOUNTER — Other Ambulatory Visit: Payer: Self-pay

## 2020-12-23 VITALS — BP 150/90 | HR 73 | Ht 63.0 in | Wt 146.0 lb

## 2020-12-23 DIAGNOSIS — G909 Disorder of the autonomic nervous system, unspecified: Secondary | ICD-10-CM | POA: Diagnosis not present

## 2020-12-23 NOTE — Progress Notes (Signed)
PATIENT: Denise Espinoza DOB: May 01, 1930  REASON FOR VISIT: follow up HISTORY FROM: patient  Chief Complaint  Patient presents with  . Follow-up    RM 2 with (alex0 Pt is well, no seizures no complaints       HISTORY OF PRESENT ILLNESS: 12/23/20 ALL:  Denise Espinoza returns for follow up. She was last seen 02/2020 and doing very well. Levetiracetam was weaned as no seizure like activity was seen following placement of pacemaker. She weaned dose to 250 BID for a period of time but has not taken consistently for several months. She may take  daily 1-2 times a week. No seizure activity. Dizziness has significantly improved following pacemaker placement. She is followed by cardiology regularly.   03/04/2020 ALL:  Denise Espinoza is a 87 y.o. female here today for follow up for questionable seizure diagnosis. She has continues levetiracetam  twice daily and is tolerating it well. She has not had any difficulty with speech changes or other seizure like activity since pacemaker placement in 05/2019. She was started on AED empirically following word finding difficulties last year. She does continue to have orthostatic dizziness but states that it is intermittent and mild. She is very active. She lives with her granddaughter. She does drive without difficulty.   HISTORY: (copied from Dr Richrd Humbles note on 07/09/2019)  87 year old female here for evaluation of seizure.  History of C2 fracture (displaced type 2 dens fracture) and myelopathy in March 2020 status post laminectomy and fusion (ORIF) in April 2020.  Thereafter patient started to have intermittent episodes of dizziness and lightheadedness.  In August 2020 patient had episode of word finding difficulties and was taken to the hospital for stroke evaluation.  No acute findings on MRI of the brain.  Possibility of seizure was raised and EEG was unremarkable.  Patient was empirically started on Keppra for seizure prevention.  Patient was also found to  have orthostatic hypotension, dizziness, syncope associated with 2 to 3-second pause on telemetry.  Ultimately patient was treated with dual-chamber pacemaker placement on 05/23/2019.  Since that time no further spells or events.  Tolerating levetiracetam.  REVIEW OF SYSTEMS: Out of a complete 14 system review of symptoms, the patient complains only of the following symptoms, dizziness, and all other reviewed systems are negative.  ALLERGIES: Allergies  Allergen Reactions  . Penicillins Hives, Swelling, Rash and Other (See Comments)    Did it involve swelling of the face/tongue/throat, SOB, or low BP? #  #  #  YES  #  #  #  Did it involve sudden or severe rash/hives, skin peeling, or any reaction on the inside of your mouth or nose? No Did you need to seek medical attention at a hospital or doctor's office? #  #  #  YES  #  #  #  When did it last happen?87 years old.   . Codeine Phosphate [Codeine] Other (See Comments)    headache    HOME MEDICATIONS: Outpatient Medications Prior to Visit  Medication Sig Dispense Refill  . acetaminophen (TYLENOL) 325 MG tablet Take 1-2 tablets (325-650 mg total) by mouth every 4 (four) hours as needed for mild pain.    Marland Kitchen Dextran 70-Hypromellose (ARTIFICIAL TEARS) 0.1-0.3 % SOLN Place 1 drop into both eyes 3 (three) times daily as needed.    . gabapentin (NEURONTIN) 300 MG capsule TAKE 1 CAPSULE BY MOUTH THREE TIMES DAILY 90 capsule 0  . mirtazapine (REMERON) 15 MG tablet Take 15 mg by mouth at  bedtime.    . Multiple Vitamin (MULTIVITAMIN) capsule Take 1 capsule by mouth daily.    . NON FORMULARY Diet Type: Regular    . Nutritional Supplements (ENSURE ENLIVE PO) Take 1 Bottle by mouth daily. Prefers Vanilla    . pantoprazole (PROTONIX) 40 MG tablet Take 40 mg by mouth daily.    . rosuvastatin (CRESTOR) 5 MG tablet Take 1 tablet (5 mg total) by mouth daily. 30 tablet 0  . sennosides-docusate sodium (SENOKOT-S) 8.6-50 MG tablet Take 2 tablets by mouth  2 (two) times daily as needed for constipation.    . traMADol (ULTRAM) 50 MG tablet Take 50 mg by mouth 3 (three) times daily as needed.    . levETIRAcetam (KEPPRA) 250 MG tablet Take 1 tablet (250 mg total) by mouth 2 (two) times daily. 60 tablet 0   No facility-administered medications prior to visit.    PAST MEDICAL HISTORY: Past Medical History:  Diagnosis Date  . Allergic rhinitis   . Anosmia   . Arthritis   . Full dentures   . History of diverticulitis   . Insomnia   . Seizure (HCC) 2020  . Senile osteoporosis   . SOB (shortness of breath)   . SVT (supraventricular tachycardia) (HCC)   . Varicose vein of leg    left  . Wears glasses     PAST SURGICAL HISTORY: Past Surgical History:  Procedure Laterality Date  . COLON RESECTION  1989   diverticulitis  . COLONOSCOPY    . ESOPHAGEAL DILATION  2015   endoscopy  . PACEMAKER IMPLANT N/A 05/23/2019   Procedure: PACEMAKER IMPLANT;  Surgeon: Regan Lemming, MD;  Location: [facility];  Service: Cardiovascular;  Laterality: N/A;  . POSTERIOR CERVICAL FUSION/FORAMINOTOMY N/A 02/11/2019   Procedure: POSTERIOR CERVICAL INSTRUMENTATION REMOVAL CERVICAL TWO;  Surgeon: Lisbeth Renshaw, MD;  Location: [facility] OR;  Service: Neurosurgery;  Laterality: N/A;  . POSTERIOR CERVICAL LAMINECTOMY N/A 02/06/2019   Procedure: Cervical one LAMINECTOMY, Occiput-Cervical four fusion;  Surgeon: Lisbeth Renshaw, MD;  Location: [facility] OR;  Service: Neurosurgery;  Laterality: N/A;    FAMILY HISTORY: Family History  Problem Relation Age of Onset  . Cancer Mother   . High blood pressure Father   . Emphysema Brother     SOCIAL HISTORY: Social History   Socioeconomic History  . Marital status: Widowed    Spouse name: Not on file  . Number of children: 2  . Years of education: 61  . Highest education level: Not on file  Occupational History  . Occupation: Retired  Tobacco Use  . Smoking status: Never Smoker  . Smokeless tobacco:  Never Used  Substance and Sexual Activity  . Alcohol use: Yes    Comment: rare  . Drug use: No  . Sexual activity: Not on file  Other Topics Concern  . Not on file  Social History Narrative   Lives w/grand dgtr, son   Caffeine- coffee 1 cup daily   Social Determinants of Health   Financial Resource Strain: Not on file  Food Insecurity: Not on file  Transportation Needs: Not on file  Physical Activity: Not on file  Stress: Not on file  Social Connections: Not on file  Intimate Partner Violence: Not on file      PHYSICAL EXAM  Vitals:   12/23/20 1445  BP: (!) 150/90  Pulse: 73  Weight: 146 lb (66.2 kg)  Height: 5\' 3"  (1.6 m)   Body mass index is 25.86 kg/m.  Generalized: Well  developed, in no acute distress  Cardiology: normal rate, irregular rhythm (noted sinus node dysfunction with pace maker, asymptomatic) no murmur noted Respiratory: clear to auscultation bilaterally  Neurological examination  Mentation: Alert oriented to time, place, history taking. Follows all commands speech and language fluent Cranial nerve II-XII: Pupils were equal round reactive to light. Extraocular movements were full, visual field were full Motor: The motor testing reveals 5 over 5 strength of all 4 extremities. Good symmetric motor tone is noted throughout.  Gait and station: Gait is normal.   DIAGNOSTIC DATA (LABS, IMAGING, TESTING) - I reviewed patient records, labs, notes, testing and imaging myself where available.  No flowsheet data found.   Lab Results  Component Value Date   WBC 3.7 (L) 05/18/2019   HGB 11.9 (L) 05/18/2019   HCT 37.2 05/18/2019   MCV 91.6 05/18/2019   PLT 189 05/18/2019      Component Value Date/Time   NA 143 05/19/2019 0619   K 3.8 05/19/2019 0619   CL 109 05/19/2019 0619   CO2 25 05/19/2019 0619   GLUCOSE 97 05/19/2019 0619   BUN 16 05/19/2019 0619   CREATININE 0.65 05/19/2019 0619   CALCIUM 8.9 05/19/2019 0619   PROT 7.3 05/17/2019 1533    ALBUMIN 4.0 05/17/2019 1533   AST 20 05/17/2019 1533   ALT 10 05/17/2019 1533   ALKPHOS 91 05/17/2019 1533   BILITOT 0.9 05/17/2019 1533   GFRNONAA >60 05/19/2019 0619   GFRAA >60 05/19/2019 0619   Lab Results  Component Value Date   CHOL 148 05/18/2019   HDL 58 05/18/2019   LDLCALC 76 05/18/2019   TRIG 70 05/18/2019   CHOLHDL 2.6 05/18/2019   Lab Results  Component Value Date   HGBA1C 5.2 05/18/2019   No results found for: VITAMINB12 Lab Results  Component Value Date   TSH 0.659 05/22/2019       ASSESSMENT AND PLAN 87 y.o. year old female  has a past medical history of Allergic rhinitis, Anosmia, Arthritis, Full dentures, History of diverticulitis, Insomnia, Seizure (HCC) (2020), Senile osteoporosis, SOB (shortness of breath), SVT (supraventricular tachycardia) (HCC), Varicose vein of leg, and Wears glasses. here with     ICD-10-CM   1. Autonomic dysfunction  G90.9      Mrs Glazer reports that she is doing well today. She has not had any concerns of seizure like activity, specifically word finding difficulty, since dual chamber pacemaker placement in 05/2019. She has tolerated levetiracetam but has not taken consistently for at least 3 months. We will discontinue levetiracetam. No previous history of seizures.  She will monitor symptoms closely and call me with any concerns. She will return for follow up in 6-12 months. Seizure precautions reviewed. She verbalizes understanding and agreement with this plan.   No orders of the defined types were placed in this encounter.    No orders of the defined types were placed in this encounter.     I spent 20 minutes with the patient. 50% of this time was spent counseling and educating patient on plan of care and medications.    Shawnie Dapper, FNP-C 12/23/2020, 3:57 PM [facility] [address] [place], [place] [address] [phone]

## 2020-12-23 NOTE — Patient Instructions (Signed)
Below is our plan:  We will stop levetiracetam. Monitor symptoms closely.   Please make sure you are staying well hydrated. I recommend 50-60 ounces daily. Well balanced diet and regular exercise encouraged. Consistent sleep schedule with 6-8 hours recommended.   Please continue follow up with care team as directed.   Follow up with me in 6-12 months   You may receive a survey regarding today's visit. I encourage you to leave honest feed back as I do use this information to improve patient care. Thank you for seeing me today!      Seizure, Adult A seizure is a sudden burst of abnormal electrical and chemical activity in the brain. Seizures usually last from 30 seconds to 2 minutes.  What are the causes? Common causes of this condition include:  Fever or infection.  Problems that affect the brain. These may include: ? A brain or head injury. ? Bleeding in the brain. ? A brain tumor.  Low levels of blood sugar or salt.  Kidney problems or liver problems.  Conditions that are passed from parent to child (are inherited).  Problems with a substance, such as: ? Having a reaction to a drug or a medicine. ? Stopping the use of a substance all of a sudden (withdrawal).  A stroke.  Disorders that affect how you develop. Sometimes, the cause may not be known.  What increases the risk?  Having someone in your family who has epilepsy. In this condition, seizures happen again and again over time. They have no clear cause.  Having had a tonic-clonic seizure before. This type of seizure causes you to: ? Tighten the muscles of the whole body. ? Lose consciousness.  Having had a head injury or strokes before.  Having had a lack of oxygen at birth. What are the signs or symptoms? There are many types of seizures. The symptoms vary depending on the type of seizure you have. Symptoms during a seizure  Shaking that you cannot control (convulsions) with fast, jerky movements of  muscles.  Stiffness of the body.  Breathing problems.  Feeling mixed up (confused).  Staring or not responding to sound or touch.  Head nodding.  Eyes that blink, flutter, or move fast.  Drooling, grunting, or making clicking sounds with your mouth  Losing control of when you pee or poop. Symptoms before a seizure  Feeling afraid, nervous, or worried.  Feeling like you may vomit.  Feeling like: ? You are moving when you are not. ? Things around you are moving when they are not.  Feeling like you saw or heard something before (dj vu).  Odd tastes or smells.  Changes in how you see. You may see flashing lights or spots. Symptoms after a seizure  Feeling confused.  Feeling sleepy.  Headache.  Sore muscles. How is this treated? If your seizure stops on its own, you will not need treatment. If your seizure lasts longer than 5 minutes, you will normally need treatment. Treatment may include:  Medicines given through an IV tube.  Avoiding things, such as medicines, that are known to cause your seizures.  Medicines to prevent seizures.  A device to prevent or control seizures.  Surgery.  A diet low in carbohydrates and high in fat (ketogenic diet). Follow these instructions at home: Medicines  Take over-the-counter and prescription medicines only as told by your doctor.  Avoid foods or drinks that may keep your medicine from working, such as alcohol. Activity  Follow instructions about driving,  swimming, or doing things that would be dangerous if you had another seizure. Wait until your doctor says it is safe for you to do these things.  If you live in the [place] ask your local department of motor vehicles when you can drive.  Get a lot of rest. Teaching others  Teach friends and family what to do when you have a seizure. They should: ? Help you get down to the ground. ? Protect your head and body. ? Loosen any clothing around your neck. ? Turn you  on your side. ? Know whether or not you need emergency care. ? Stay with you until you are better.  Also, tell them what not to do if you have a seizure. Tell them: ? They should not hold you down. ? They should not put anything in your mouth.   General instructions  Avoid anything that gives you seizures.  Keep a seizure diary. Write down: ? What you remember about each seizure. ? What you think caused each seizure.  Keep all follow-up visits. Contact a doctor if:  You have another seizure or seizures. Call the doctor each time you have a seizure.  The pattern of your seizures changes.  You keep having seizures with treatment.  You have symptoms of being sick or having an infection.  You are not able to take your medicine. Get help right away if:  You have any of these problems: ? A seizure that lasts longer than 5 minutes. ? Many seizures in a row and you do not feel better between seizures. ? A seizure that makes it harder to breathe. ? A seizure and you can no longer speak or use part of your body.  You do not wake up right after a seizure.  You get hurt during a seizure.  You feel confused or have pain right after a seizure. These symptoms may be an emergency. Get help right away. Call your local emergency services (911 in the [place]  Do not wait to see if the symptoms will go away.  Do not drive yourself to the hospital. Summary  A seizure is a sudden burst of abnormal electrical and chemical activity in the brain. Seizures normally last from 30 seconds to 2 minutes.  Causes of seizures include illness, injury to the head, low levels of blood sugar or salt, and certain conditions.  Most seizures will stop on their own in less than 5 minutes. Seizures that last longer than 5 minutes are a medical emergency and need treatment right away.  Many medicines are used to treat seizures. Take over-the-counter and prescription medicines only as told by your  doctor. This information is not intended to replace advice given to you by your health care provider. Make sure you discuss any questions you have with your health care provider. Document Revised: 04/02/2020 Document Reviewed: 04/02/2020 Elsevier Patient Education  [place].

## 2020-12-27 NOTE — Progress Notes (Signed)
I reviewed note and agree with plan.   Honora Searson R. Tamorah Hada, MD 12/27/2020, 2:26 PM Certified in Neurology, Neurophysiology and Neuroimaging  [facility] [address], [address] [place], [place] [address] [phone]  

## 2021-02-22 ENCOUNTER — Ambulatory Visit (INDEPENDENT_AMBULATORY_CARE_PROVIDER_SITE_OTHER): Payer: Medicare Other

## 2021-02-22 DIAGNOSIS — I495 Sick sinus syndrome: Secondary | ICD-10-CM

## 2021-02-22 LAB — CUP PACEART REMOTE DEVICE CHECK
Battery Remaining Longevity: 161 mo
Battery Voltage: 3.05 V
Brady Statistic AP VP Percent: 2.44 %
Brady Statistic AP VS Percent: 20.93 %
Brady Statistic AS VP Percent: 3.68 %
Brady Statistic AS VS Percent: 72.95 %
Brady Statistic RA Percent Paced: 22.47 %
Brady Statistic RV Percent Paced: 6.12 %
Date Time Interrogation Session: 20220517052932
Implantable Lead Implant Date: 20200814
Implantable Lead Implant Date: 20200814
Implantable Lead Location: 753859
Implantable Lead Location: 753860
Implantable Lead Model: 5076
Implantable Lead Model: 5076
Implantable Pulse Generator Implant Date: 20200814
Lead Channel Impedance Value: 304 Ohm
Lead Channel Impedance Value: 323 Ohm
Lead Channel Impedance Value: 399 Ohm
Lead Channel Impedance Value: 437 Ohm
Lead Channel Pacing Threshold Amplitude: 0.625 V
Lead Channel Pacing Threshold Amplitude: 1.125 V
Lead Channel Pacing Threshold Pulse Width: 0.4 ms
Lead Channel Pacing Threshold Pulse Width: 0.4 ms
Lead Channel Sensing Intrinsic Amplitude: 0.75 mV
Lead Channel Sensing Intrinsic Amplitude: 0.75 mV
Lead Channel Sensing Intrinsic Amplitude: 15.25 mV
Lead Channel Sensing Intrinsic Amplitude: 15.25 mV
Lead Channel Setting Pacing Amplitude: 1.5 V
Lead Channel Setting Pacing Amplitude: 2.5 V
Lead Channel Setting Pacing Pulse Width: 0.4 ms
Lead Channel Setting Sensing Sensitivity: 2 mV

## 2021-03-17 NOTE — Progress Notes (Signed)
Remote pacemaker transmission.   

## 2021-03-21 ENCOUNTER — Emergency Department (HOSPITAL_COMMUNITY): Payer: Medicare Other

## 2021-03-21 ENCOUNTER — Other Ambulatory Visit: Payer: Self-pay

## 2021-03-21 ENCOUNTER — Emergency Department (HOSPITAL_COMMUNITY)
Admission: EM | Admit: 2021-03-21 | Discharge: 2021-03-21 | Disposition: A | Discharge status: Home / Self Care | Payer: Medicare Other | Attending: Emergency Medicine | Admitting: Emergency Medicine

## 2021-03-21 ENCOUNTER — Encounter (HOSPITAL_COMMUNITY): Payer: Self-pay | Admitting: Emergency Medicine

## 2021-03-21 DIAGNOSIS — Z950 Presence of cardiac pacemaker: Secondary | ICD-10-CM | POA: Insufficient documentation

## 2021-03-21 DIAGNOSIS — R531 Weakness: Secondary | ICD-10-CM

## 2021-03-21 DIAGNOSIS — Z20822 Contact with and (suspected) exposure to covid-19: Secondary | ICD-10-CM | POA: Diagnosis not present

## 2021-03-21 DIAGNOSIS — J181 Lobar pneumonia, unspecified organism: Secondary | ICD-10-CM | POA: Diagnosis not present

## 2021-03-21 DIAGNOSIS — J189 Pneumonia, unspecified organism: Secondary | ICD-10-CM

## 2021-03-21 LAB — COMPREHENSIVE METABOLIC PANEL
ALT: 12 U/L (ref 0–44)
AST: 16 U/L (ref 15–41)
Albumin: 3.6 g/dL (ref 3.5–5.0)
Alkaline Phosphatase: 77 U/L (ref 38–126)
Anion gap: 9 (ref 5–15)
BUN: 17 mg/dL (ref 8–23)
CO2: 24 mmol/L (ref 22–32)
Calcium: 8.6 mg/dL — ABNORMAL LOW (ref 8.9–10.3)
Chloride: 101 mmol/L (ref 98–111)
Creatinine, Ser: 0.65 mg/dL (ref 0.44–1.00)
GFR, Estimated: >60 mL/min (ref >60)
Glucose, Bld: 128 mg/dL — ABNORMAL HIGH (ref 70–99)
Potassium: 3.6 mmol/L (ref 3.5–5.1)
Sodium: 134 mmol/L — ABNORMAL LOW (ref 135–145)
Total Bilirubin: 1.2 mg/dL (ref 0.3–1.2)
Total Protein: 6.7 g/dL (ref 6.5–8.1)

## 2021-03-21 LAB — CBC WITH DIFFERENTIAL/PLATELET
Abs Immature Granulocytes: 0.11 10*3/uL — ABNORMAL HIGH (ref 0.00–0.07)
Basophils Absolute: 0 10*3/uL (ref 0.0–0.1)
Basophils Relative: 0 %
Eosinophils Absolute: 0 10*3/uL (ref 0.0–0.5)
Eosinophils Relative: 0 %
HCT: 36.1 % (ref 36.0–46.0)
Hemoglobin: 11.9 g/dL — ABNORMAL LOW (ref 12.0–15.0)
Immature Granulocytes: 2 %
Lymphocytes Relative: 15 %
Lymphs Abs: 1 10*3/uL (ref 0.7–4.0)
MCH: 30.7 pg (ref 26.0–34.0)
MCHC: 33 g/dL (ref 30.0–36.0)
MCV: 93.3 fL (ref 80.0–100.0)
Monocytes Absolute: 0.9 10*3/uL (ref 0.1–1.0)
Monocytes Relative: 13 %
Neutro Abs: 4.7 10*3/uL (ref 1.7–7.7)
Neutrophils Relative %: 70 %
Platelets: 143 10*3/uL — ABNORMAL LOW (ref 150–400)
RBC: 3.87 MIL/uL (ref 3.87–5.11)
RDW: 13.4 % (ref 11.5–15.5)
WBC: 6.6 10*3/uL (ref 4.0–10.5)
nRBC: 0 % (ref 0.0–0.2)

## 2021-03-21 LAB — RESP PANEL BY RT-PCR (FLU A&B, COVID) ARPGX2
Influenza A by PCR: NEGATIVE
Influenza B by PCR: NEGATIVE
SARS Coronavirus 2 by RT PCR: NEGATIVE

## 2021-03-21 LAB — LACTIC ACID, PLASMA: Lactic Acid, Venous: 1.6 mmol/L (ref 0.5–1.9)

## 2021-03-21 MED ORDER — LEVOFLOXACIN 750 MG PO TABS: 750.0000 mg | ORAL_TABLET | Freq: Every day | ORAL | 0 refills | Status: DC

## 2021-03-21 MED ORDER — LEVOFLOXACIN 750 MG PO TABS
750.0000 mg | ORAL_TABLET | Freq: Once | ORAL | Status: AC
  Administered 2021-03-21: 750 mg via ORAL
  Filled 2021-03-21: qty 1

## 2021-03-21 NOTE — ED Notes (Signed)
Patient state she normally uses a cane to talk and patient ambulated approximately 60 feet with pulse ox >94%.

## 2021-03-21 NOTE — ED Triage Notes (Signed)
Pt reports "not feeling well" since last Thursday. Family reports increasing weakness and fever.

## 2021-03-21 NOTE — ED Provider Notes (Signed)
[facility] EMERGENCY DEPARTMENT Provider Note   CSN: [id number] Arrival date & time: 03/21/21  1135     History Chief Complaint  Patient presents with   Weakness    Denise Espinoza is a 87 y.o. female.  87 year old female presents with complaint of word finding difficulty for the past 3 to 4 days, feeling generally unwell, generally weak and states she may have had a fever earlier.  She denies chest pain, shortness of breath, abdominal pain, changes in bowel or bladder habits or recent falls.  States that she has had a cough which is nonproductive, onset last Thursday.  No known sick contacts, has had 2 COVID vaccines.      Past Medical History:  Diagnosis Date   Allergic rhinitis    Anosmia    Arthritis    Full dentures    History of diverticulitis    Insomnia    Seizure (HCC) 2020   Senile osteoporosis    SOB (shortness of breath)    SVT (supraventricular tachycardia) (HCC)    Varicose vein of leg    left   Wears glasses     Patient Active Problem List   Diagnosis Date Noted   Syncope 05/18/2019   Slurred speech 05/18/2019   Seizures (HCC)    Word finding difficulty 05/17/2019   Supraventricular tachycardia (HCC) 03/22/2019   Dyslipidemia 03/22/2019   GERD without esophagitis 03/22/2019   Chronic constipation 03/22/2019   Insomnia disorder 03/22/2019   H/O Cervical  laminectomy 03/13/2019   Acute lower UTI    Acute blood loss anemia    Orthostatic hypotension    History of supraventricular tachycardia    Central cord syndrome (HCC) 02/15/2019   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    Past Surgical History:  Procedure Laterality Date   COLON RESECTION  1989   diverticulitis   COLONOSCOPY     ESOPHAGEAL DILATION  2015   endoscopy   PACEMAKER IMPLANT N/A 05/23/2019   Procedure: PACEMAKER IMPLANT;  Surgeon: Regan Lemming, MD;  Location: [facility] INVASIVE CV LAB;  Service: Cardiovascular;  Laterality: N/A;   POSTERIOR CERVICAL  FUSION/FORAMINOTOMY N/A 02/11/2019   Procedure: POSTERIOR CERVICAL INSTRUMENTATION REMOVAL CERVICAL TWO;  Surgeon: Lisbeth Renshaw, MD;  Location: [facility] OR;  Service: Neurosurgery;  Laterality: N/A;   POSTERIOR CERVICAL LAMINECTOMY N/A 02/06/2019   Procedure: Cervical one LAMINECTOMY, Occiput-Cervical four fusion;  Surgeon: Lisbeth Renshaw, MD;  Location: [facility] OR;  Service: Neurosurgery;  Laterality: N/A;     OB History   No obstetric history on file.     Family History  Problem Relation Age of Onset   Cancer Mother    High blood pressure Father    Emphysema Brother     Social History   Tobacco Use   Smoking status: Never   Smokeless tobacco: Never  Substance Use Topics   Alcohol use: Yes    Comment: rare   Drug use: No    Home Medications Prior to Admission medications   Medication Sig Start Date End Date Taking? Authorizing Provider  levofloxacin (LEVAQUIN) 750 MG tablet Take 1 tablet (750 mg total) by mouth daily for 4 days. 03/21/21 03/25/21 Yes Jeannie Fend, PA-C  acetaminophen (TYLENOL) 325 MG tablet Take 1-2 tablets (325-650 mg total) by mouth every 4 (four) hours as needed for mild pain. 03/10/19   Love, Evlyn Kanner, PA-C  Dextran 70-Hypromellose (ARTIFICIAL TEARS) 0.1-0.3 % SOLN Place 1 drop into both eyes 3 (three) times daily as needed.  03/24/19   [provider]  gabapentin (NEURONTIN) 300 MG capsule TAKE 1 CAPSULE BY MOUTH THREE TIMES DAILY 03/24/20   Ranelle Oyster, MD  mirtazapine (REMERON) 15 MG tablet Take 15 mg by mouth at bedtime.    [provider]  Multiple Vitamin (MULTIVITAMIN) capsule Take 1 capsule by mouth daily.    [provider]  NON FORMULARY Diet Type: Regular 03/12/19   [provider]  Nutritional Supplements (ENSURE ENLIVE PO) Take 1 Bottle by mouth daily. Prefers Vanilla 03/17/19   [provider]  pantoprazole (PROTONIX) 40 MG tablet Take 40 mg by mouth daily. 09/14/20   [provider]   rosuvastatin (CRESTOR) 5 MG tablet Take 1 tablet (5 mg total) by mouth daily. 04/17/19   Sharee Holster, NP  sennosides-docusate sodium (SENOKOT-S) 8.6-50 MG tablet Take 2 tablets by mouth 2 (two) times daily as needed for constipation. 03/24/19   [provider]  traMADol (ULTRAM) 50 MG tablet Take 50 mg by mouth 3 (three) times daily as needed. 06/09/19   [provider]    Allergies    Penicillins and Codeine phosphate [codeine]  Review of Systems   Review of Systems  Constitutional:  Positive for fever. Negative for chills and diaphoresis.  HENT:  Negative for congestion.   Respiratory:  Positive for cough. Negative for shortness of breath.   Cardiovascular:  Negative for chest pain.  Gastrointestinal:  Negative for abdominal pain, constipation, diarrhea, nausea and vomiting.  Genitourinary:  Negative for difficulty urinating and dysuria.  Musculoskeletal:  Negative for arthralgias and myalgias.  Skin:  Negative for rash and wound.  Allergic/Immunologic: Negative for immunocompromised state.  Neurological:  Positive for speech difficulty and weakness. Negative for dizziness and headaches.  All other systems reviewed and are negative.  Physical Exam Updated Vital Signs BP 134/80   Pulse 60   Temp 98.3 F (36.8 C) (Oral)   Resp 20   Ht 5\' 3"  (1.6 m)   Wt 65.8 kg   SpO2 94%   BMI 25.69 kg/m   Physical Exam Vitals and nursing note reviewed.  Constitutional:      General: She is not in acute distress.    Appearance: She is well-developed. She is not diaphoretic.  HENT:     Head: Normocephalic and atraumatic.     Mouth/Throat:     Mouth: Mucous membranes are dry.  Eyes:     Conjunctiva/sclera: Conjunctivae normal.  Cardiovascular:     Rate and Rhythm: Normal rate and regular rhythm.     Pulses: Normal pulses.     Heart sounds: Normal heart sounds.  Pulmonary:     Effort: Pulmonary effort is normal.     Breath sounds: Normal breath sounds.   Abdominal:     Palpations: Abdomen is soft.     Tenderness: There is no abdominal tenderness.  Musculoskeletal:     Cervical back: Neck supple.     Right lower leg: No edema.     Left lower leg: No edema.  Skin:    General: Skin is warm and dry.     Findings: No erythema or rash.  Neurological:     Mental Status: She is alert and oriented to person, place, and time.  Psychiatric:        Behavior: Behavior normal.    ED Results / Procedures / Treatments   Labs (all labs ordered are listed, but only abnormal results are displayed) Labs Reviewed  COMPREHENSIVE METABOLIC PANEL - Abnormal;  Notable for the following components:      Result Value   Sodium 134 (*)    Glucose, Bld 128 (*)    Calcium 8.6 (*)    All other components within normal limits  CBC WITH DIFFERENTIAL/PLATELET - Abnormal; Notable for the following components:   Hemoglobin 11.9 (*)    Platelets 143 (*)    Abs Immature Granulocytes 0.11 (*)    All other components within normal limits  CULTURE, BLOOD (SINGLE)  RESP PANEL BY RT-PCR (FLU A&B, COVID) ARPGX2  CULTURE, BLOOD (SINGLE)  LACTIC ACID, PLASMA  URINALYSIS, ROUTINE W REFLEX MICROSCOPIC    EKG EKG Interpretation  Date/Time:  Monday March 21 2021 12:42:09 EDT Ventricular Rate:  71 PR Interval:  220 QRS Duration: 88 QT Interval:  377 QTC Calculation: 410 R Axis:   -27 Text Interpretation: Sinus rhythm Prolonged PR interval Borderline left axis deviation Low voltage, precordial leads Confirmed by Pricilla Loveless [phone]) on 03/21/2021 12:49:06 PM  Radiology CT Head Wo Contrast  Result Date: 03/21/2021 CLINICAL DATA:  Confusion and fever EXAM: CT HEAD WITHOUT CONTRAST TECHNIQUE: Contiguous axial images were obtained from the base of the skull through the vertex without intravenous contrast. COMPARISON:  May 17, 2019 head CT; brain MRI May 18, 2019 FINDINGS: Brain: There is mild diffuse atrophy. There is no intracranial mass, hemorrhage, extra-axial  fluid collection, or midline shift. There is decreased attenuation throughout much of the centra semiovale bilaterally. No acute appearing infarct is appreciable. Vascular: No hyperdense vessel. There is calcification in each carotid siphon region. Skull: Bony calvarium appears intact. There is postoperative change in upper cervical region. Sinuses/Orbits: Paranasal sinuses which are visualized are clear. Orbits appear symmetric bilaterally. Other: Mastoid air cells are clear. IMPRESSION: Atrophy with periventricular small vessel disease, stable. No acute infarct. No mass or hemorrhage. Foci of arterial vascular calcification noted. Electronically Signed   By: Bretta Bang III M.D.   On: 03/21/2021 13:24   DG Chest Port 1 View  Result Date: 03/21/2021 CLINICAL DATA:  Fever EXAM: PORTABLE CHEST 1 VIEW COMPARISON:  May 24, 2019 FINDINGS: There is an area of ill-defined airspace opacity in the left upper lobe. There is interstitial thickening bilaterally. Heart is borderline enlarged with pulmonary vascularity normal. Pacemaker leads are attached to the right atrium and right ventricle. No adenopathy. There is aortic atherosclerosis. Bones are osteoporotic. IMPRESSION: 1. Area of ill-defined airspace opacity in the left upper lobe, a likely focus of pneumonia. 2. Interstitial thickening, likely due to underlying chronic bronchitis. 3. Heart borderline enlarged. Pacemaker leads attached to right atrium and right ventricle. 4.  Bones osteoporotic. 5.  Aortic Atherosclerosis (ICD10-I70.0). Followup PA and lateral chest radiographs recommended in 3-4 weeks following trial of antibiotic therapy to ensure resolution and exclude underlying malignancy. Electronically Signed   By: Bretta Bang III M.D.   On: 03/21/2021 13:07    Procedures Procedures   Medications Ordered in ED Medications  levofloxacin (LEVAQUIN) tablet 750 mg (750 mg Oral Given 03/21/21 1359)    ED Course  I have reviewed the triage  vital signs and the nursing notes.  Pertinent labs & imaging results that were available during my care of the patient were reviewed by me and considered in my medical decision making (see chart for details).  Clinical Course as of 03/21/21 1641  Mon Mar 21, 2021  1839 87 year old female brought in by her son who is now at bedside with complaint of cough with chills onset 4 days ago, progressively  worsening and now too weak to ambulate independently safely. Patient was seen by her PCP 4 days ago who did lab work, unknown results.  No known sick contacts.  Reports she is hungry but has decreased p.o. intake. [LM]  1430 Found to have pneumonia on chest x-ray, COVID/flu negative. Given Levaquin (pcn allergy). CT head for word finding difficulty, no acute findings.  CBC, CMP, lactic unremarkable.  O2 sat 98% on room air. Discussed results with patient and her son at bedside, son states patient lives with him, is too weak to go home. States it took several people to help get pt into the car today to come here and she is normally able to care for herself at home.  [LM]  1505 Patient was able to ambulate to the bathroom with minimal assistance, does use a cane and has a walker at home.  Patient is adamant that she be discharged home today, discussed importance of follow-up with PCP and given strict return to ER precautions.  Patient has declined admission to the hospital and verbalizes understanding of her discharge instructions and plan. [LM]    Clinical Course User Index [LM] Alden Hipp   MDM Rules/Calculators/A&P                           Final Clinical Impression(s) / ED Diagnoses Final diagnoses:  Community acquired pneumonia of left upper lobe of lung  Generalized weakness    Rx / DC Orders ED Discharge Orders          Ordered    levofloxacin (LEVAQUIN) 750 MG tablet  Daily        03/21/21 1543             Jeannie Fend, PA-C 03/21/21 1641    Pricilla Loveless,  MD 03/22/21 [phone]

## 2021-03-21 NOTE — Discharge Instructions (Addendum)
Your chest x-ray shows pneumonia today (left upper). Recommend hospital admission however you would like to go home today. You were given a dose of Levaquin (antibiotic) in the ER. A prescription for this medicine was sent to your [facility], you can take this tomorrow.  Return to the ER at any time for any worsening or concerning symptoms. Call your doctor for follow up. You will need a recheck in 2 days and a repeat chest x-ray in 3-4 weeks.

## 2021-03-21 NOTE — ED Notes (Signed)
Patient states nausea. Patient given ginger ale and crackers.

## 2021-03-24 ENCOUNTER — Other Ambulatory Visit: Payer: Self-pay

## 2021-03-24 ENCOUNTER — Encounter (HOSPITAL_COMMUNITY): Payer: Self-pay

## 2021-03-24 ENCOUNTER — Emergency Department (HOSPITAL_COMMUNITY): Payer: Medicare Other

## 2021-03-24 ENCOUNTER — Observation Stay (HOSPITAL_COMMUNITY)
Admission: EM | Admit: 2021-03-24 | Discharge: 2021-03-25 | Disposition: A | Discharge status: Home / Self Care | Payer: Medicare Other | Attending: Family Medicine | Admitting: Family Medicine

## 2021-03-24 DIAGNOSIS — I509 Heart failure, unspecified: Secondary | ICD-10-CM | POA: Diagnosis not present

## 2021-03-24 DIAGNOSIS — Z955 Presence of coronary angioplasty implant and graft: Secondary | ICD-10-CM | POA: Diagnosis not present

## 2021-03-24 DIAGNOSIS — Z79899 Other long term (current) drug therapy: Secondary | ICD-10-CM | POA: Insufficient documentation

## 2021-03-24 DIAGNOSIS — E876 Hypokalemia: Secondary | ICD-10-CM

## 2021-03-24 DIAGNOSIS — I480 Paroxysmal atrial fibrillation: Secondary | ICD-10-CM | POA: Diagnosis present

## 2021-03-24 DIAGNOSIS — R569 Unspecified convulsions: Secondary | ICD-10-CM

## 2021-03-24 DIAGNOSIS — J9601 Acute respiratory failure with hypoxia: Secondary | ICD-10-CM | POA: Diagnosis not present

## 2021-03-24 DIAGNOSIS — R531 Weakness: Secondary | ICD-10-CM

## 2021-03-24 DIAGNOSIS — I4891 Unspecified atrial fibrillation: Secondary | ICD-10-CM

## 2021-03-24 DIAGNOSIS — R7989 Other specified abnormal findings of blood chemistry: Secondary | ICD-10-CM | POA: Diagnosis present

## 2021-03-24 DIAGNOSIS — I471 Supraventricular tachycardia, unspecified: Secondary | ICD-10-CM | POA: Diagnosis present

## 2021-03-24 DIAGNOSIS — Z20822 Contact with and (suspected) exposure to covid-19: Secondary | ICD-10-CM | POA: Diagnosis not present

## 2021-03-24 DIAGNOSIS — R0902 Hypoxemia: Secondary | ICD-10-CM

## 2021-03-24 DIAGNOSIS — I5032 Chronic diastolic (congestive) heart failure: Secondary | ICD-10-CM | POA: Diagnosis present

## 2021-03-24 DIAGNOSIS — K219 Gastro-esophageal reflux disease without esophagitis: Secondary | ICD-10-CM | POA: Diagnosis present

## 2021-03-24 LAB — CBC WITH DIFFERENTIAL/PLATELET
Abs Immature Granulocytes: 0.02 10*3/uL (ref 0.00–0.07)
Basophils Absolute: 0 10*3/uL (ref 0.0–0.1)
Basophils Relative: 0 %
Eosinophils Absolute: 0.2 10*3/uL (ref 0.0–0.5)
Eosinophils Relative: 3 %
HCT: 37.6 % (ref 36.0–46.0)
Hemoglobin: 12.1 g/dL (ref 12.0–15.0)
Immature Granulocytes: 0 %
Lymphocytes Relative: 29 %
Lymphs Abs: 1.4 10*3/uL (ref 0.7–4.0)
MCH: 30.3 pg (ref 26.0–34.0)
MCHC: 32.2 g/dL (ref 30.0–36.0)
MCV: 94.2 fL (ref 80.0–100.0)
Monocytes Absolute: 0.4 10*3/uL (ref 0.1–1.0)
Monocytes Relative: 8 %
Neutro Abs: 2.9 10*3/uL (ref 1.7–7.7)
Neutrophils Relative %: 60 %
Platelets: 165 10*3/uL (ref 150–400)
RBC: 3.99 MIL/uL (ref 3.87–5.11)
RDW: 13.2 % (ref 11.5–15.5)
WBC: 4.8 10*3/uL (ref 4.0–10.5)
nRBC: 0 % (ref 0.0–0.2)

## 2021-03-24 LAB — BASIC METABOLIC PANEL
Anion gap: 8 (ref 5–15)
BUN: 23 mg/dL (ref 8–23)
CO2: 25 mmol/L (ref 22–32)
Calcium: 8.8 mg/dL — ABNORMAL LOW (ref 8.9–10.3)
Chloride: 104 mmol/L (ref 98–111)
Creatinine, Ser: 0.84 mg/dL (ref 0.44–1.00)
GFR, Estimated: >60 mL/min (ref >60)
Glucose, Bld: 103 mg/dL — ABNORMAL HIGH (ref 70–99)
Potassium: 3.3 mmol/L — ABNORMAL LOW (ref 3.5–5.1)
Sodium: 137 mmol/L (ref 135–145)

## 2021-03-24 LAB — LACTIC ACID, PLASMA: Lactic Acid, Venous: 1.3 mmol/L (ref 0.5–1.9)

## 2021-03-24 LAB — URINALYSIS, ROUTINE W REFLEX MICROSCOPIC
Bilirubin Urine: NEGATIVE
Glucose, UA: NEGATIVE mg/dL
Hgb urine dipstick: NEGATIVE
Ketones, ur: NEGATIVE mg/dL
Leukocytes,Ua: NEGATIVE
Nitrite: NEGATIVE
Protein, ur: NEGATIVE mg/dL
Specific Gravity, Urine: >1.03 — ABNORMAL HIGH (ref 1.005–1.030)
pH: 5 (ref 5.0–8.0)

## 2021-03-24 LAB — TROPONIN I (HIGH SENSITIVITY): Troponin I (High Sensitivity): 5 ng/L (ref <18)

## 2021-03-24 MED ORDER — SODIUM CHLORIDE 0.9 % IV BOLUS
500.0000 mL | Freq: Once | INTRAVENOUS | Status: AC
  Administered 2021-03-24: 500 mL via INTRAVENOUS

## 2021-03-24 MED ORDER — ENOXAPARIN SODIUM 80 MG/0.8ML IJ SOSY
1.0000 mg/kg | PREFILLED_SYRINGE | Freq: Once | INTRAMUSCULAR | Status: AC
  Administered 2021-03-25: 65 mg via SUBCUTANEOUS
  Filled 2021-03-24: qty 0.8

## 2021-03-24 NOTE — ED Notes (Signed)
Pt states her heart as been hearting since yesterday

## 2021-03-24 NOTE — ED Provider Notes (Signed)
[facility] EMERGENCY DEPARTMENT Provider Note   CSN: [id number] Arrival date & time: 03/24/21  1804     History Chief Complaint  Patient presents with   Weakness    Denise Espinoza is a 87 y.o. female with a history of SVT, arthritis, history of seizures, who was seen here on Sunday and diagnosed with a left lower lobe pneumonia and completed her 5-day course of Levaquin presents today for evaluation of persistent and worsening weakness and fatigue.  She denies chest pain but has had significant shortness of breath, stating today she has been unable to walk more than a couple of steps without having to stop and rest.  She was also noted episodes of palpitations.  She has had no peripheral edema, she did have fevers early in the course of this illness but has not had any in the past several days.  She denies abdominal pain, nausea or vomiting.  Her cough has been nonproductive.  She is COVID vaccinated and was tested negative at her visit here on Sunday.  She also mentions that for the past several weeks she has been noticing bright red blood with bowel movements and is concerned about this symptom.  She does have a history of diverticulitis but again denies any abdominal pain at this time.  She has had poor p.o. intake since her pneumonia diagnosis.   The history is provided by the patient.      Past Medical History:  Diagnosis Date   Allergic rhinitis    Anosmia    Arthritis    Full dentures    History of diverticulitis    Insomnia    Seizure (HCC) 2020   Senile osteoporosis    SOB (shortness of breath)    SVT (supraventricular tachycardia) (HCC)    Varicose vein of leg    left   Wears glasses     Patient Active Problem List   Diagnosis Date Noted   Syncope 05/18/2019   Slurred speech 05/18/2019   Seizures (HCC)    Word finding difficulty 05/17/2019   Supraventricular tachycardia (HCC) 03/22/2019   Dyslipidemia 03/22/2019   GERD without esophagitis 03/22/2019   Chronic  constipation 03/22/2019   Insomnia disorder 03/22/2019   H/O Cervical  laminectomy 03/13/2019   Acute lower UTI    Acute blood loss anemia    Orthostatic hypotension    History of supraventricular tachycardia    Central cord syndrome (HCC) 02/15/2019   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    Past Surgical History:  Procedure Laterality Date   COLON RESECTION  1989   diverticulitis   COLONOSCOPY     ESOPHAGEAL DILATION  2015   endoscopy   PACEMAKER IMPLANT N/A 05/23/2019   Procedure: PACEMAKER IMPLANT;  Surgeon: Regan Lemming, MD;  Location: [facility] INVASIVE CV LAB;  Service: Cardiovascular;  Laterality: N/A;   POSTERIOR CERVICAL FUSION/FORAMINOTOMY N/A 02/11/2019   Procedure: POSTERIOR CERVICAL INSTRUMENTATION REMOVAL CERVICAL TWO;  Surgeon: Lisbeth Renshaw, MD;  Location: [facility] OR;  Service: Neurosurgery;  Laterality: N/A;   POSTERIOR CERVICAL LAMINECTOMY N/A 02/06/2019   Procedure: Cervical one LAMINECTOMY, Occiput-Cervical four fusion;  Surgeon: Lisbeth Renshaw, MD;  Location: [facility] OR;  Service: Neurosurgery;  Laterality: N/A;     OB History   No obstetric history on file.     Family History  Problem Relation Age of Onset   Cancer Mother    High blood pressure Father    Emphysema Brother     Social History  Tobacco Use   Smoking status: Never   Smokeless tobacco: Never  Substance Use Topics   Alcohol use: Yes    Comment: rare   Drug use: No    Home Medications Prior to Admission medications   Medication Sig Start Date End Date Taking? Authorizing Provider  acetaminophen (TYLENOL) 325 MG tablet Take 1-2 tablets (325-650 mg total) by mouth every 4 (four) hours as needed for mild pain. 03/10/19   Love, Evlyn Kanner, PA-C  Dextran 70-Hypromellose (ARTIFICIAL TEARS) 0.1-0.3 % SOLN Place 1 drop into both eyes 3 (three) times daily as needed. 03/24/19   [provider]  gabapentin (NEURONTIN) 300 MG capsule TAKE 1 CAPSULE BY MOUTH THREE TIMES  DAILY 03/24/20   Ranelle Oyster, MD  levofloxacin (LEVAQUIN) 750 MG tablet Take 1 tablet (750 mg total) by mouth daily for 4 days. 03/21/21 03/25/21  Jeannie Fend, PA-C  mirtazapine (REMERON) 15 MG tablet Take 15 mg by mouth at bedtime.    [provider]  Multiple Vitamin (MULTIVITAMIN) capsule Take 1 capsule by mouth daily.    [provider]  NON FORMULARY Diet Type: Regular 03/12/19   [provider]  Nutritional Supplements (ENSURE ENLIVE PO) Take 1 Bottle by mouth daily. Prefers Vanilla 03/17/19   [provider]  pantoprazole (PROTONIX) 40 MG tablet Take 40 mg by mouth daily. 09/14/20   [provider]  rosuvastatin (CRESTOR) 5 MG tablet Take 1 tablet (5 mg total) by mouth daily. 04/17/19   Sharee Holster, NP  sennosides-docusate sodium (SENOKOT-S) 8.6-50 MG tablet Take 2 tablets by mouth 2 (two) times daily as needed for constipation. 03/24/19   [provider]  traMADol (ULTRAM) 50 MG tablet Take 50 mg by mouth 3 (three) times daily as needed. 06/09/19   [provider]    Allergies    Penicillins and Codeine phosphate [codeine]  Review of Systems   Review of Systems  Constitutional:  Positive for appetite change and fatigue. Negative for fever.  HENT:  Negative for congestion and sore throat.   Eyes: Negative.   Respiratory:  Positive for chest tightness and shortness of breath.   Cardiovascular:  Positive for palpitations. Negative for chest pain and leg swelling.  Gastrointestinal:  Negative for abdominal pain and nausea.  Genitourinary: Negative.  Negative for dysuria.  Musculoskeletal:  Negative for arthralgias, joint swelling and neck pain.  Skin: Negative.  Negative for rash and wound.  Neurological:  Positive for weakness. Negative for dizziness, light-headedness, numbness and headaches.  Psychiatric/Behavioral: Negative.     Physical Exam Updated Vital Signs BP (!) 139/96   Pulse 79   Temp 98.2 F (36.8 C)  (Oral)   Resp 19   Ht 5\' 3"  (1.6 m)   Wt 65.8 kg   SpO2 100%   BMI 25.69 kg/m   Physical Exam Vitals and nursing note reviewed.  Constitutional:      Appearance: She is well-developed.  HENT:     Head: Normocephalic and atraumatic.     Mouth/Throat:     Mouth: Mucous membranes are dry.  Eyes:     Conjunctiva/sclera: Conjunctivae normal.     Comments: No conjunctival pallor  Cardiovascular:     Rate and Rhythm: Normal rate. Rhythm irregular.     Heart sounds: Normal heart sounds.  Pulmonary:     Effort: Respiratory distress present.     Breath sounds: Normal breath sounds. No wheezing.     Comments: Patient had an oxygen saturation of  87% upon first entering the room.  She was placed on 2 L  and probably improved to 95% Abdominal:     General: Bowel sounds are normal.     Palpations: Abdomen is soft.     Tenderness: There is no abdominal tenderness. There is no guarding.  Musculoskeletal:        General: Normal range of motion.     Cervical back: Normal range of motion.     Right lower leg: No edema.     Left lower leg: No edema.  Skin:    General: Skin is warm and dry.  Neurological:     Mental Status: She is alert.    ED Results / Procedures / Treatments   Labs (all labs ordered are listed, but only abnormal results are displayed) Labs Reviewed  BASIC METABOLIC PANEL - Abnormal; Notable for the following components:      Result Value   Potassium 3.3 (*)    Glucose, Bld 103 (*)    Calcium 8.8 (*)    All other components within normal limits  URINALYSIS, ROUTINE W REFLEX MICROSCOPIC - Abnormal; Notable for the following components:   Specific Gravity, Urine >1.030 (*)    All other components within normal limits  RESP PANEL BY RT-PCR (FLU A&B, COVID) ARPGX2  CBC WITH DIFFERENTIAL/PLATELET  LACTIC ACID, PLASMA  BRAIN NATRIURETIC PEPTIDE  TROPONIN I (HIGH SENSITIVITY)    EKG EKG Interpretation  Date/Time:  Thursday March 24 2021 20:07:12 EDT Ventricular  Rate:  103 PR Interval:    QRS Duration: 93 QT Interval:  367 QTC Calculation: 481 R Axis:   -29 Text Interpretation: Atrial fibrillation Borderline left axis deviation Minimal ST depression, anterolateral leads afib new from prior 3 days ago Confirmed by Meridee Score [phone]) on 03/24/2021 8:21:53 PM  Radiology DG Chest Portable 1 View  Result Date: 03/24/2021 CLINICAL DATA:  87 year old female with shortness of breath. EXAM: PORTABLE CHEST 1 VIEW COMPARISON:  Chest radiograph dated 03/21/2021. FINDINGS: There is diffuse chronic interstitial coarsening and bronchitic changes. No focal consolidation, pleural effusion, or pneumothorax. Mild cardiomegaly. Left pectoral pacemaker device. Atherosclerotic calcification of the aorta. Osteopenia with degenerative changes of the spine. Several right lateral rib fractures seen on the prior radiograph. No new fracture. IMPRESSION: 1. No acute cardiopulmonary process. 2. Mild cardiomegaly. Electronically Signed   By: Elgie Collard M.D.   On: 03/24/2021 20:29    Procedures Procedures   Medications Ordered in ED Medications  enoxaparin (LOVENOX) injection 65 mg (has no administration in time range)  sodium chloride 0.9 % bolus 500 mL (500 mLs Intravenous New Bag/Given 03/24/21 2140)    ED Course  I have reviewed the triage vital signs and the nursing notes.  Pertinent labs & imaging results that were available during my care of the patient were reviewed by me and considered in my medical decision making (see chart for details).  Clinical Course as of 03/25/21 0001  Thu Mar 24, 2021  3144 87 year old female here with dyspnea on exertion and some fatigue over the last few days.  She is noted to be an atrial fibrillation.  Chest x-ray showing some cardiomegaly.  No history of A. fib.  Will likely need admission to the hospital for some diuresis and cardiac echo. [MB]    Clinical Course User Index [MB] Terrilee Files, MD   MDM  Rules/Calculators/A&P  Patient with recent diagnosis of pneumonia here 4 days ago presenting with worsening weakness and shortness of breath.  She was hypoxic at presentation to 87%.  Her chest x-ray today does not look like her pneumonia has progressed.  However she does have a new oxygen requirement and will require admission for further evaluation and management.  Her EKG is significant for new atrial fibrillation additionally.  Discussed with Dr. Thomes Dinning who accepts pt for admission. Final Clinical Impression(s) / ED Diagnoses Final diagnoses:  Atrial fibrillation, unspecified type [facility])  Hypoxia  Weakness    Rx / DC Orders ED Discharge Orders     None        Victoriano Lain 03/25/21 0001    Terrilee Files, MD 03/25/21 [phone]

## 2021-03-24 NOTE — ED Triage Notes (Addendum)
Pt c/o increased fatigue and rapid HR, weakness, DOE, that began ~1 week, was seen here on Sunday and states she was dx'd with Pneumonia. No acute resp distress 98% RA & 73 HR

## 2021-03-25 ENCOUNTER — Observation Stay (HOSPITAL_BASED_OUTPATIENT_CLINIC_OR_DEPARTMENT_OTHER): Payer: Medicare Other

## 2021-03-25 DIAGNOSIS — K219 Gastro-esophageal reflux disease without esophagitis: Secondary | ICD-10-CM | POA: Diagnosis not present

## 2021-03-25 DIAGNOSIS — I480 Paroxysmal atrial fibrillation: Secondary | ICD-10-CM | POA: Diagnosis not present

## 2021-03-25 DIAGNOSIS — J9601 Acute respiratory failure with hypoxia: Secondary | ICD-10-CM | POA: Diagnosis present

## 2021-03-25 DIAGNOSIS — R569 Unspecified convulsions: Secondary | ICD-10-CM

## 2021-03-25 DIAGNOSIS — I5032 Chronic diastolic (congestive) heart failure: Secondary | ICD-10-CM

## 2021-03-25 DIAGNOSIS — E876 Hypokalemia: Secondary | ICD-10-CM

## 2021-03-25 DIAGNOSIS — I471 Supraventricular tachycardia: Secondary | ICD-10-CM

## 2021-03-25 DIAGNOSIS — R7989 Other specified abnormal findings of blood chemistry: Secondary | ICD-10-CM | POA: Diagnosis not present

## 2021-03-25 LAB — ECHOCARDIOGRAM COMPLETE
AR max vel: 2.53 cm2
AV Area VTI: 2.64 cm2
AV Area mean vel: 2.36 cm2
AV Mean grad: 3.2 mmHg
AV Peak grad: 5.3 mmHg
Ao pk vel: 1.15 m/s
Area-P 1/2: 2.57 cm2
Height: 63 in
P 1/2 time: 602 msec
S' Lateral: 2.8 cm
Weight: 2320 oz

## 2021-03-25 LAB — COMPREHENSIVE METABOLIC PANEL
ALT: 16 U/L (ref 0–44)
AST: 16 U/L (ref 15–41)
Albumin: 3.1 g/dL — ABNORMAL LOW (ref 3.5–5.0)
Alkaline Phosphatase: 65 U/L (ref 38–126)
Anion gap: 7 (ref 5–15)
BUN: 21 mg/dL (ref 8–23)
CO2: 26 mmol/L (ref 22–32)
Calcium: 8.7 mg/dL — ABNORMAL LOW (ref 8.9–10.3)
Chloride: 108 mmol/L (ref 98–111)
Creatinine, Ser: 0.79 mg/dL (ref 0.44–1.00)
GFR, Estimated: >60 mL/min (ref >60)
Glucose, Bld: 96 mg/dL (ref 70–99)
Potassium: 3.5 mmol/L (ref 3.5–5.1)
Sodium: 141 mmol/L (ref 135–145)
Total Bilirubin: 0.3 mg/dL (ref 0.3–1.2)
Total Protein: 6.1 g/dL — ABNORMAL LOW (ref 6.5–8.1)

## 2021-03-25 LAB — RESP PANEL BY RT-PCR (FLU A&B, COVID) ARPGX2
Influenza A by PCR: NEGATIVE
Influenza B by PCR: NEGATIVE
SARS Coronavirus 2 by RT PCR: NEGATIVE

## 2021-03-25 LAB — MAGNESIUM: Magnesium: 1.8 mg/dL (ref 1.7–2.4)

## 2021-03-25 LAB — CBC
HCT: 34.3 % — ABNORMAL LOW (ref 36.0–46.0)
Hemoglobin: 11 g/dL — ABNORMAL LOW (ref 12.0–15.0)
MCH: 30.5 pg (ref 26.0–34.0)
MCHC: 32.1 g/dL (ref 30.0–36.0)
MCV: 95 fL (ref 80.0–100.0)
Platelets: 166 10*3/uL (ref 150–400)
RBC: 3.61 MIL/uL — ABNORMAL LOW (ref 3.87–5.11)
RDW: 13.2 % (ref 11.5–15.5)
WBC: 3.9 10*3/uL — ABNORMAL LOW (ref 4.0–10.5)
nRBC: 0 % (ref 0.0–0.2)

## 2021-03-25 LAB — PROTIME-INR
INR: 1.2 (ref 0.8–1.2)
Prothrombin Time: 15.1 seconds (ref 11.4–15.2)

## 2021-03-25 LAB — PHOSPHORUS: Phosphorus: 3.3 mg/dL (ref 2.5–4.6)

## 2021-03-25 LAB — BRAIN NATRIURETIC PEPTIDE: B Natriuretic Peptide: 110 pg/mL — ABNORMAL HIGH (ref 0.0–100.0)

## 2021-03-25 LAB — APTT: aPTT: 34 seconds (ref 24–36)

## 2021-03-25 MED ORDER — METOPROLOL TARTRATE 25 MG PO TABS: 25.0000 mg | ORAL_TABLET | Freq: Two times a day (BID) | ORAL | 1 refills | Status: DC

## 2021-03-25 MED ORDER — PREDNISONE 20 MG PO TABS: 40.0000 mg | ORAL_TABLET | Freq: Every day | ORAL | 0 refills | Status: AC

## 2021-03-25 MED ORDER — PANTOPRAZOLE SODIUM 40 MG PO TBEC
40.0000 mg | DELAYED_RELEASE_TABLET | Freq: Every day | ORAL | Status: DC
  Administered 2021-03-25: 40 mg via ORAL
  Filled 2021-03-25: qty 1

## 2021-03-25 MED ORDER — POTASSIUM CHLORIDE CRYS ER 20 MEQ PO TBCR
40.0000 meq | EXTENDED_RELEASE_TABLET | Freq: Once | ORAL | Status: AC
  Administered 2021-03-25: 40 meq via ORAL
  Filled 2021-03-25: qty 2

## 2021-03-25 MED ORDER — GABAPENTIN 300 MG PO CAPS
300.0000 mg | ORAL_CAPSULE | Freq: Three times a day (TID) | ORAL | Status: DC
  Administered 2021-03-25: 300 mg via ORAL
  Filled 2021-03-25: qty 1

## 2021-03-25 MED ORDER — FUROSEMIDE 20 MG PO TABS: 20.0000 mg | ORAL_TABLET | Freq: Every day | ORAL | 0 refills | Status: DC

## 2021-03-25 MED ORDER — FUROSEMIDE 40 MG PO TABS
20.0000 mg | ORAL_TABLET | Freq: Once | ORAL | Status: AC
  Administered 2021-03-25: 20 mg via ORAL
  Filled 2021-03-25: qty 1

## 2021-03-25 MED ORDER — GUAIFENESIN ER 600 MG PO TB12: 600.0000 mg | ORAL_TABLET | Freq: Two times a day (BID) | ORAL | 0 refills | Status: AC

## 2021-03-25 MED ORDER — ROSUVASTATIN CALCIUM 5 MG PO TABS
5.0000 mg | ORAL_TABLET | Freq: Every day | ORAL | Status: DC
  Administered 2021-03-25: 5 mg via ORAL
  Filled 2021-03-25: qty 1

## 2021-03-25 MED ORDER — FUROSEMIDE 20 MG PO TABS: 20.0000 mg | ORAL_TABLET | Freq: Every day | ORAL | 1 refills | Status: DC

## 2021-03-25 MED ORDER — SENNA-DOCUSATE SODIUM 8.6-50 MG PO TABS: 2.0000 | ORAL_TABLET | Freq: Every day | ORAL | 3 refills | Status: DC

## 2021-03-25 MED ORDER — PANTOPRAZOLE SODIUM 40 MG PO TBEC: 40.0000 mg | DELAYED_RELEASE_TABLET | Freq: Two times a day (BID) | ORAL | 3 refills | Status: DC

## 2021-03-25 MED ORDER — PREDNISONE 50 MG PO TABS
50.0000 mg | ORAL_TABLET | Freq: Once | ORAL | Status: AC
  Administered 2021-03-25: 50 mg via ORAL
  Filled 2021-03-25: qty 1

## 2021-03-25 MED ORDER — CALCIUM CARBONATE ANTACID 500 MG PO CHEW: 1.0000 | CHEWABLE_TABLET | Freq: Two times a day (BID) | ORAL | 0 refills | Status: AC

## 2021-03-25 MED ORDER — MAGNESIUM SULFATE 2 GM/50ML IV SOLN
2.0000 g | Freq: Once | INTRAVENOUS | Status: AC
  Administered 2021-03-25: 2 g via INTRAVENOUS
  Filled 2021-03-25: qty 50

## 2021-03-25 MED ORDER — DM-GUAIFENESIN ER 30-600 MG PO TB12
1.0000 | ORAL_TABLET | Freq: Two times a day (BID) | ORAL | Status: DC
  Administered 2021-03-25: 1 via ORAL
  Filled 2021-03-25: qty 1

## 2021-03-25 MED ORDER — POTASSIUM CHLORIDE ER 10 MEQ PO TBCR: 10.0000 meq | EXTENDED_RELEASE_TABLET | Freq: Every day | ORAL | 1 refills | Status: DC

## 2021-03-25 NOTE — ED Notes (Signed)
SATURATION QUALIFICATIONS: (This note is used to comply with regulatory documentation for home oxygen)  Patient Saturations on Room Air at Rest = 100%  Patient Saturations on Room Air while Ambulating = 94%  Patient Saturations on 0 Liters of oxygen while Ambulating = 94-100%  Please briefly explain why patient needs home oxygen:

## 2021-03-25 NOTE — H&P (Signed)
History and Physical  Denise Espinoza ZOX:[id number] DOB: 1930-04-11 DOA: 03/24/2021  Referring physician: Burgess Amor, PA-C PCP: Tacy Learn, FNP  Patient coming from: Home  Chief Complaint: Shortness of breath  HPI: Denise Espinoza is a 87 y.o. female with medical history significant for arthritis, anxiety, seizures, SVT s/p pacemaker, GERD who presents to the emergency department due to weakness and shortness of breath.  She was recently seen in the ED on 6/13 due to community-acquired pneumonia during which she complained of being too weak to ambulate independently, she was found to have pneumonia on chest x-ray and patient was started on Levaquin and discharged home.  On returning today, she complained of shortness of breath on ambulation and that she could barely walk 10 to 15 feet without being short of breath.  Patient ambulates with a cane at baseline.  Patient was reported to complain of some bright red blood with bowel movements within the past several weeks per medical record.  She denies chest pain, nausea, vomiting or abdominal pain.  ED Course:  In the emergency department, she was hemodynamically stable.  She was noted to be hypoxic with an O2 sat of 87%.  Supplemental oxygen via [place] at 2 LPM was provided with improvement in O2 sat to 95%.  Work-up in the ED showed normal CBC and BMP except for hypokalemia.  Troponin x1- 5; lactic acid 1.3, urinalysis was unimpressive for UTI.  BNP 110.  Influenza a, B, SARS coronavirus 2 was negative. Chest x-ray showed no acute cardiopulmonary process, but showed mild cardiomegaly IV hydration of 500 mL of NS was provided.  Hospitalist was asked to admit patient for further evaluation and management.  Review of Systems: Constitutional: Positive for fatigue.  Negative for chills and fever.  HENT: Negative for ear pain and sore throat.   Eyes: Negative for pain and visual disturbance.  Respiratory: Positive for shortness of breath.  Negative for  cough.   Cardiovascular: Positive for palpitations.  Negative for chest pain   Gastrointestinal: Negative for abdominal pain and vomiting.  Endocrine: Negative for polyphagia and polyuria.  Genitourinary: Negative for decreased urine volume, dysuria, enuresis Musculoskeletal: Negative for arthralgias and back pain.  Skin: Negative for color change and rash.  Allergic/Immunologic: Negative for immunocompromised state.  Neurological: Positive for weakness.  Negative for tremors, syncope, speech difficulty Hematological: Does not bruise/bleed easily.  All other systems reviewed and are negative  Past Medical History:  Diagnosis Date   Allergic rhinitis    Anosmia    Arthritis    Full dentures    History of diverticulitis    Insomnia    Seizure (HCC) 2020   Senile osteoporosis    SOB (shortness of breath)    SVT (supraventricular tachycardia) (HCC)    Varicose vein of leg    left   Wears glasses    Past Surgical History:  Procedure Laterality Date   COLON RESECTION  1989   diverticulitis   COLONOSCOPY     ESOPHAGEAL DILATION  2015   endoscopy   PACEMAKER IMPLANT N/A 05/23/2019   Procedure: PACEMAKER IMPLANT;  Surgeon: Regan Lemming, MD;  Location: [facility];  Service: Cardiovascular;  Laterality: N/A;   POSTERIOR CERVICAL FUSION/FORAMINOTOMY N/A 02/11/2019   Procedure: POSTERIOR CERVICAL INSTRUMENTATION REMOVAL CERVICAL TWO;  Surgeon: Lisbeth Renshaw, MD;  Location: [facility] OR;  Service: Neurosurgery;  Laterality: N/A;   POSTERIOR CERVICAL LAMINECTOMY N/A 02/06/2019   Procedure: Cervical one LAMINECTOMY, [place]iput-Cervical four fusion;  Surgeon: Lisbeth Renshaw, MD;  Location: [facility] OR;  Service: Neurosurgery;  Laterality: N/A;    Social History:  reports that she has never smoked. She has never used smokeless tobacco. She reports current alcohol use. She reports that she does not use drugs.   Allergies  Allergen Reactions   Penicillins Hives, Swelling, Rash and  Other (See Comments)    Did it involve swelling of the face/tongue/throat, SOB, or low BP? #  #  #  YES  #  #  #  Did it involve sudden or severe rash/hives, skin peeling, or any reaction on the inside of your mouth or nose? No Did you need to seek medical attention at a hospital or doctor's office? #  #  #  YES  #  #  #  When did it last happen?    87 years old.    Codeine Phosphate [Codeine] Other (See Comments)    headache    Family History  Problem Relation Age of Onset   Cancer Mother    High blood pressure Father    Emphysema Brother      Prior to Admission medications   Medication Sig Start Date End Date Taking? Authorizing Provider  acetaminophen (TYLENOL) 325 MG tablet Take 1-2 tablets (325-650 mg total) by mouth every 4 (four) hours as needed for mild pain. 03/10/19   Love, Evlyn Kanner, PA-C  Dextran 70-Hypromellose (ARTIFICIAL TEARS) 0.1-0.3 % SOLN Place 1 drop into both eyes 3 (three) times daily as needed. 03/24/19   [provider]  gabapentin (NEURONTIN) 300 MG capsule TAKE 1 CAPSULE BY MOUTH THREE TIMES DAILY 03/24/20   Ranelle Oyster, MD  levofloxacin (LEVAQUIN) 750 MG tablet Take 1 tablet (750 mg total) by mouth daily for 4 days. 03/21/21 03/25/21  Jeannie Fend, PA-C  mirtazapine (REMERON) 15 MG tablet Take 15 mg by mouth at bedtime.    [provider]  Multiple Vitamin (MULTIVITAMIN) capsule Take 1 capsule by mouth daily.    [provider]  NON FORMULARY Diet Type: Regular 03/12/19   [provider]  Nutritional Supplements (ENSURE ENLIVE PO) Take 1 Bottle by mouth daily. Prefers Vanilla 03/17/19   [provider]  pantoprazole (PROTONIX) 40 MG tablet Take 40 mg by mouth daily. 09/14/20   [provider]  rosuvastatin (CRESTOR) 5 MG tablet Take 1 tablet (5 mg total) by mouth daily. 04/17/19   Sharee Holster, NP  sennosides-docusate sodium (SENOKOT-S) 8.6-50 MG tablet Take 2 tablets by mouth 2 (two) times daily as needed  for constipation. 03/24/19   [provider]  traMADol (ULTRAM) 50 MG tablet Take 50 mg by mouth 3 (three) times daily as needed. 06/09/19   [provider]    Physical Exam: BP (!) 126/93   Pulse (!) 52   Temp 98.2 F (36.8 C) (Oral)   Resp (!) 29   Ht 5\' 3"  (1.6 m)   Wt 65.8 kg   SpO2 100%   BMI 25.69 kg/m   General: 87 y.o. year-old female well developed well nourished in no acute distress.  Alert and oriented x3. HEENT: NCAT, EOMI Neck: Supple, trachea medial Cardiovascular: Irregular rate and rhythm with no rubs or gallops.  No thyromegaly or JVD noted.  No lower extremity edema. 2/4 pulses in all 4 extremities. Respiratory: Clear to auscultation with no wheezes or rales. Good inspiratory effort. Abdomen: Soft, nontender nondistended with normal bowel sounds x4 quadrants. Muskuloskeletal: No cyanosis, clubbing or edema noted bilaterally Neuro: CN II-XII intact, strength  5/5 x 4, sensation, reflexes intact Skin: No ulcerative lesions noted or rashes Psychiatry: Judgement and insight appear normal. Mood is appropriate for condition and setting          Labs on Admission:  Basic Metabolic Panel: Recent Labs  Lab 03/21/21 1235 03/24/21 2130  NA 134* 137  K 3.6 3.3*  CL 101 104  CO2 24 25  GLUCOSE 128* 103*  BUN 17 23  CREATININE 0.65 0.84  CALCIUM 8.6* 8.8*   Liver Function Tests: Recent Labs  Lab 03/21/21 1235  AST 16  ALT 12  ALKPHOS 77  BILITOT 1.2  PROT 6.7  ALBUMIN 3.6   No results for input(s): LIPASE, AMYLASE in the last 168 hours. No results for input(s): AMMONIA in the last 168 hours. CBC: Recent Labs  Lab 03/21/21 1235 03/24/21 2130  WBC 6.6 4.8  NEUTROABS 4.7 2.9  HGB 11.9* 12.1  HCT 36.1 37.6  MCV 93.3 94.2  PLT 143* 165   Cardiac Enzymes: No results for input(s): CKTOTAL, CKMB, CKMBINDEX, TROPONINI in the last 168 hours.  BNP (last 3 results) Recent Labs    03/24/21 2130  BNP 110.0*    ProBNP (last 3  results) No results for input(s): PROBNP in the last 8760 hours.  CBG: No results for input(s): GLUCAP in the last 168 hours.  Radiological Exams on Admission: DG Chest Portable 1 View  Result Date: 03/24/2021 CLINICAL DATA:  87 year old female with shortness of breath. EXAM: PORTABLE CHEST 1 VIEW COMPARISON:  Chest radiograph dated 03/21/2021. FINDINGS: There is diffuse chronic interstitial coarsening and bronchitic changes. No focal consolidation, pleural effusion, or pneumothorax. Mild cardiomegaly. Left pectoral pacemaker device. Atherosclerotic calcification of the aorta. Osteopenia with degenerative changes of the spine. Several right lateral rib fractures seen on the prior radiograph. No new fracture. IMPRESSION: 1. No acute cardiopulmonary process. 2. Mild cardiomegaly. Electronically Signed   By: Elgie Collard M.D.   On: 03/24/2021 20:29    EKG: I independently viewed the EKG done and my findings are as followed: Atrial fibrillation with RVR  Assessment/Plan Present on Admission:  Acute respiratory failure with hypoxia (HCC)  GERD without esophagitis  Supraventricular tachycardia (HCC)  Principal Problem:   Acute respiratory failure with hypoxia (HCC) Active Problems:   Supraventricular tachycardia (HCC)   GERD without esophagitis   Seizures (HCC)   AF (paroxysmal atrial fibrillation) (HCC)   Hypokalemia   Acute respiratory failure with hypoxia possibly due to multifactorial Patient was recently started on levofloxacin on 6/13 due to CAP POA, this could be a contributing factor to patient's hypoxia. Patient already completed her antibiotic regimen (Confirmed with pharmacy) Continue Mucinex Continue  incentive spirometry Continue supplemental oxygen to maintain O2 sat > 92% with plan to wean patient off oxygen as tolerated (patient does not use oxygen at baseline).  Paroxysmal atrial fibrillation EKG shows atrial fibrillation with RVR CHA2DS2- VASc score   is = 3     Which is  equal to = 3.2 % annual risk of stroke  Rate is currently controlled possibly due to patient's pacemaker Therapeutic Lovenox was given, consider transitioning to DOAC if patient continues to have A. Fib  Elevated BNP rule out CHF BNP 110 Continue total input/output, daily weights and fluid restriction Continue Cardiac diet  Echocardiogram in the morning   Hypokalemia K+ 3.3, this will be replenished  Questionable GI bleed She was reported to have bright red blood per rectum medical record FOBT will be checked  SVT s/p pacemaker Stable;  last remote device check was on 02/22/2021   History of seizures Continue gabapentin  GERD Continue Protonix  Hyperlipidemia Continue statin  DVT prophylaxis: Levofloxacin  Code Status: Partial code  Family Communication: None at bedside  Disposition Plan:  Patient is from:                        home Anticipated DC to:                   SNF or family members home Anticipated DC date:               2 to 3 days Anticipated DC barriers:          Patient requires inpatient management due to acute respiratory failure with hypoxia requiring supplemental oxygen    Consults called: None  Admission status: Observation    Frankey Shown MD [facility]  03/25/2021, 3:50 AM

## 2021-03-25 NOTE — Progress Notes (Signed)
  Echocardiogram 2D Echocardiogram has been performed.  Pieter Partridge 03/25/2021, 11:30 AM

## 2021-03-25 NOTE — Discharge Instructions (Signed)
1)Very low-salt diet advised 2)Weigh yourself daily, call if you gain more than 3 pounds in 1 day or more than 5 pounds in 1 week as your diuretic medications may need to be adjusted 3)Limit your Fluid  intake to no more than 60 ounces (1.8 Liters) per day 4)Take Protonix and Tums as prescribed for your stomach problems including indigestion

## 2021-03-25 NOTE — Discharge Summary (Signed)
Denise Espinoza, is a 87 y.o. female  DOB 1930-03-07  MRN [id number].  Admission date:  03/24/2021  Admitting Physician  Frankey Shown, DO  Discharge Date:  03/25/2021   Primary MD  Tacy Learn, FNP  Recommendations for primary care physician for things to follow:   1)Very low-salt diet advised 2)Weigh yourself daily, call if you gain more than 3 pounds in 1 day or more than 5 pounds in 1 week as your diuretic medications may need to be adjusted 3)Limit your Fluid  intake to no more than 60 ounces (1.8 Liters) per day 4)Take Protonix and Tums as prescribed for your stomach problems including indigestion  Admission Diagnosis  Acute respiratory failure with hypoxia (HCC) [J96.01]   Discharge Diagnosis  Acute respiratory failure with hypoxia (HCC) [J96.01]    Principal Problem:   Chronic heart failure with preserved ejection fraction (HFpEF) (HCC) Active Problems:   Supraventricular tachycardia (HCC)   GERD without esophagitis   Seizures (HCC)   Acute respiratory failure with hypoxia (HCC)   AF (paroxysmal atrial fibrillation) (HCC)   Hypokalemia   Elevated brain natriuretic peptide (BNP) level      Past Medical History:  Diagnosis Date   Allergic rhinitis    Anosmia    Arthritis    Full dentures    History of diverticulitis    Insomnia    Seizure (HCC) 2020   Senile osteoporosis    SOB (shortness of breath)    SVT (supraventricular tachycardia) (HCC)    Varicose vein of leg    left   Wears glasses     Past Surgical History:  Procedure Laterality Date   COLON RESECTION  1989   diverticulitis   COLONOSCOPY     ESOPHAGEAL DILATION  2015   endoscopy   PACEMAKER IMPLANT N/A 05/23/2019   Procedure: PACEMAKER IMPLANT;  Surgeon: Regan Lemming, MD;  Location: [facility];  Service: Cardiovascular;  Laterality: N/A;   POSTERIOR CERVICAL FUSION/FORAMINOTOMY N/A 02/11/2019    Procedure: POSTERIOR CERVICAL INSTRUMENTATION REMOVAL CERVICAL TWO;  Surgeon: Lisbeth Renshaw, MD;  Location: [facility] OR;  Service: Neurosurgery;  Laterality: N/A;   POSTERIOR CERVICAL LAMINECTOMY N/A 02/06/2019   Procedure: Cervical one LAMINECTOMY, Occiput-Cervical four fusion;  Surgeon: Lisbeth Renshaw, MD;  Location: [facility];  Service: Neurosurgery;  Laterality: N/A;      HPI  from the history and physical done on the day of admission:     Chief Complaint: Shortness of breath   HPI: Denise Espinoza is a 87 y.o. female with medical history significant for arthritis, anxiety, seizures, SVT s/p pacemaker, GERD who presents to the emergency department due to weakness and shortness of breath.  She was recently seen in the ED on 6/13 due to community-acquired pneumonia during which she complained of being too weak to ambulate independently, she was found to have pneumonia on chest x-ray and patient was started on Levaquin and discharged home.  On returning today, she complained of shortness of breath on ambulation and that she could barely  walk 10 to 15 feet without being short of breath.  Patient ambulates with a cane at baseline.  Patient was reported to complain of some bright red blood with bowel movements within the past several weeks per medical record.  She denies chest pain, nausea, vomiting or abdominal pain.   ED Course:  In the emergency department, she was hemodynamically stable.  She was noted to be hypoxic with an O2 sat of 87%.  Supplemental oxygen via [place] at 2 LPM was provided with improvement in O2 sat to 95%.  Work-up in the ED showed normal CBC and BMP except for hypokalemia.  Troponin x1- 5; lactic acid 1.3, urinalysis was unimpressive for UTI.  BNP 110.  Influenza a, B, SARS coronavirus 2 was negative. Chest x-ray showed no acute cardiopulmonary process, but showed mild cardiomegaly IV hydration of 500 mL of NS was provided.  Hospitalist was asked to admit patient for further  evaluation and     Hospital Course:     1)HFpEF--- patient with chronic diastolic dysfunction CHF with EF in the 60 to 65% range -Echocardiogram from 03/25/2021 noted -Chest x-ray without acute finding, BNP mildly elevated, troponin WNL -No hypoxia at rest or post ambulation -Low Dose Lasix as prescribed along with potassium  2) fatigue and generalized weakness--- recently treated for pneumonia, repeat chest imaging studies and clinical exam does not demonstrate an acute chest infection at this time --No hypoxia, -Supportive treatment and physical therapy rehab as advised  3)PAFib--- status post prior pacemaker placement  -CHA2DS2- VASc score   is = 3    Which is  equal to = 3.2 % annual risk of stroke --give metoprolol 25 twice daily for rate control, EF is 60 to 65%, -Patient would like to discuss possible anticoagulation with cardiologist/PCP  4) chronic anemia--review of records reveals baseline hemoglobin usually between 11 and 12, hemoglobin currently around 11 -??  Prior episode of bright red blood per rectum, no further bleeding concerns at this time -Follow-up with PCP if bright red blood per rectum re-occurs  5)GERD--Protonix and Tums as ordered  6)HLD--- continue Crestor  7)Post Pneumonia Cough/Bronchitis---- recently treated for pneumonia, repeat chest imaging studies and clinical exam does not demonstrate an acute chest infection at this time --No hypoxia, -- Prednisone and mucolytics as ordered  Discharge Condition: stable  Follow UP   Follow-up Information     Tacy Learn, FNP. Schedule an appointment as soon as possible for a visit in 1 week(s).   Specialty: Family Medicine Contact information: [address] [place] [address] [phone]         Laural Golden, MD .   Specialty: Internal Medicine                 Consults obtained - na  Diet and Activity recommendation:  As advised  Discharge Instructions    Discharge  Instructions     Call MD for:  difficulty breathing, headache or visual disturbances   Complete by: As directed    Call MD for:  persistant dizziness or light-headedness   Complete by: As directed    Call MD for:  persistant nausea and vomiting   Complete by: As directed    Call MD for:  temperature >100.4   Complete by: As directed    Diet - low sodium heart healthy   Complete by: As directed    Discharge instructions   Complete by: As directed    1)Very low-salt diet advised 2)Weigh yourself daily,  call if you gain more than 3 pounds in 1 day or more than 5 pounds in 1 week as your diuretic medications may need to be adjusted 3)Limit your Fluid  intake to no more than 60 ounces (1.8 Liters) per day 4)Take Protonix and Tums as prescribed for your stomach problems including indigestion   Increase activity slowly   Complete by: As directed          Discharge Medications     Allergies as of 03/25/2021       Reactions   Penicillins Hives, Swelling, Rash, Other (See Comments)   Did it involve swelling of the face/tongue/throat, SOB, or low BP? #  #  #  YES  #  #  #  Did it involve sudden or severe rash/hives, skin peeling, or any reaction on the inside of your mouth or nose? No Did you need to seek medical attention at a hospital or doctor's office? #  #  #  YES  #  #  #  When did it last happen?    87 years old.   Codeine Phosphate [codeine] Other (See Comments)   headache        Medication List     STOP taking these medications    levofloxacin 750 MG tablet Commonly known as: LEVAQUIN       TAKE these medications    acetaminophen 325 MG tablet Commonly known as: TYLENOL Take 1-2 tablets (325-650 mg total) by mouth every 4 (four) hours as needed for mild pain.   Artificial Tears 0.1-0.3 % Soln Generic drug: Dextran 70-Hypromellose Place 1 drop into both eyes 3 (three) times daily as needed.   calcium carbonate 500 MG chewable tablet Commonly known as:  Tums Chew 1 tablet (200 mg of elemental calcium total) by mouth 2 (two) times daily for 5 days.   ENSURE ENLIVE PO Take 1 Bottle by mouth daily. Prefers Vanilla   furosemide 20 MG tablet Commonly known as: Lasix Take 1 tablet (20 mg total) by mouth daily.   gabapentin 300 MG capsule Commonly known as: NEURONTIN TAKE 1 CAPSULE BY MOUTH THREE TIMES DAILY   guaiFENesin 600 MG 12 hr tablet Commonly known as: Mucinex Take 1 tablet (600 mg total) by mouth 2 (two) times daily for 10 days.   metoprolol tartrate 25 MG tablet Commonly known as: LOPRESSOR Take 1 tablet (25 mg total) by mouth 2 (two) times daily.   mirtazapine 15 MG tablet Commonly known as: REMERON Take 15 mg by mouth at bedtime.   multivitamin capsule Take 1 capsule by mouth daily.   NON FORMULARY Diet Type: Regular   pantoprazole 40 MG tablet Commonly known as: PROTONIX Take 1 tablet (40 mg total) by mouth 2 (two) times daily. What changed: when to take this   potassium chloride 10 MEQ tablet Commonly known as: KLOR-CON Take 1 tablet (10 mEq total) by mouth daily. Take While taking Lasix/furosemide   predniSONE 20 MG tablet Commonly known as: DELTASONE Take 2 tablets (40 mg total) by mouth daily with breakfast for 5 days.   rosuvastatin 5 MG tablet Commonly known as: CRESTOR Take 1 tablet (5 mg total) by mouth daily.   sennosides-docusate sodium 8.6-50 MG tablet Commonly known as: SENOKOT-S Take 2 tablets by mouth at bedtime. What changed:  when to take this reasons to take this   traMADol 50 MG tablet Commonly known as: ULTRAM Take 50 mg by mouth 3 (three) times daily as needed.  Major procedures and Radiology Reports - PLEASE review detailed and final reports for all details, in brief -   CT Head Wo Contrast  Result Date: 03/21/2021 CLINICAL DATA:  Confusion and fever EXAM: CT HEAD WITHOUT CONTRAST TECHNIQUE: Contiguous axial images were obtained from the base of the skull through  the vertex without intravenous contrast. COMPARISON:  May 17, 2019 head CT; brain MRI May 18, 2019 FINDINGS: Brain: There is mild diffuse atrophy. There is no intracranial mass, hemorrhage, extra-axial fluid collection, or midline shift. There is decreased attenuation throughout much of the centra semiovale bilaterally. No acute appearing infarct is appreciable. Vascular: No hyperdense vessel. There is calcification in each carotid siphon region. Skull: Bony calvarium appears intact. There is postoperative change in upper cervical region. Sinuses/Orbits: Paranasal sinuses which are visualized are clear. Orbits appear symmetric bilaterally. Other: Mastoid air cells are clear. IMPRESSION: Atrophy with periventricular small vessel disease, stable. No acute infarct. No mass or hemorrhage. Foci of arterial vascular calcification noted. Electronically Signed   By: Bretta Bang III M.D.   On: 03/21/2021 13:24   DG Chest Portable 1 View  Result Date: 03/24/2021 CLINICAL DATA:  87 year old female with shortness of breath. EXAM: PORTABLE CHEST 1 VIEW COMPARISON:  Chest radiograph dated 03/21/2021. FINDINGS: There is diffuse chronic interstitial coarsening and bronchitic changes. No focal consolidation, pleural effusion, or pneumothorax. Mild cardiomegaly. Left pectoral pacemaker device. Atherosclerotic calcification of the aorta. Osteopenia with degenerative changes of the spine. Several right lateral rib fractures seen on the prior radiograph. No new fracture. IMPRESSION: 1. No acute cardiopulmonary process. 2. Mild cardiomegaly. Electronically Signed   By: Elgie Collard M.D.   On: 03/24/2021 20:29   DG Chest Port 1 View  Result Date: 03/21/2021 CLINICAL DATA:  Fever EXAM: PORTABLE CHEST 1 VIEW COMPARISON:  May 24, 2019 FINDINGS: There is an area of ill-defined airspace opacity in the left upper lobe. There is interstitial thickening bilaterally. Heart is borderline enlarged with pulmonary vascularity  normal. Pacemaker leads are attached to the right atrium and right ventricle. No adenopathy. There is aortic atherosclerosis. Bones are osteoporotic. IMPRESSION: 1. Area of ill-defined airspace opacity in the left upper lobe, a likely focus of pneumonia. 2. Interstitial thickening, likely due to underlying chronic bronchitis. 3. Heart borderline enlarged. Pacemaker leads attached to right atrium and right ventricle. 4.  Bones osteoporotic. 5.  Aortic Atherosclerosis (ICD10-I70.0). Followup PA and lateral chest radiographs recommended in 3-4 weeks following trial of antibiotic therapy to ensure resolution and exclude underlying malignancy. Electronically Signed   By: Bretta Bang III M.D.   On: 03/21/2021 13:07   ECHOCARDIOGRAM COMPLETE  Result Date: 03/25/2021    ECHOCARDIOGRAM REPORT   Patient Name:   Denise Espinoza Date of Exam: 03/25/2021 Medical Rec #:  [id number]    Height:       63.0 in Accession #:    [id number]   Weight:       145.0 lb Date of Birth:  1930-06-21    BSA:          1.687 m Patient Age:    89 years     BP:           111/68 mmHg Patient Gender: F            HR:           102 bpm. [facility] Procedure: 2D Echo, Cardiac Doppler and Color Doppler Indications:    CHF  History:  Patient has prior history of Echocardiogram examinations, most                 recent 05/18/2019. CHF, Pacemaker, Arrythmias:SVT and Atrial                 Fibrillation; Signs/Symptoms:Shortness of Breath. Pneumonia.  Sonographer:    Lavenia Atlas RDCS Referring Phys: [id number] OLADAPO ADEFESO IMPRESSIONS  1. Left ventricular ejection fraction, by estimation, is 60 to 65%. The left ventricle has normal function. The left ventricle has no regional wall motion abnormalities. Left ventricular diastolic parameters are consistent with Grade I diastolic dysfunction (impaired relaxation).  2. Right ventricular systolic function is normal. The right ventricular size is normal.  3. Right atrial size was  mildly dilated.  4. The mitral valve is normal in structure. No evidence of mitral valve regurgitation. No evidence of mitral stenosis.  5. Tricuspid valve regurgitation is mild to moderate.  6. The aortic valve is tricuspid. Aortic valve regurgitation is mild. No aortic stenosis is present.  7. The inferior vena cava is dilated in size with <50% respiratory variability, suggesting right atrial pressure of 15 mmHg. FINDINGS  Left Ventricle: Left ventricular ejection fraction, by estimation, is 60 to 65%. The left ventricle has normal function. The left ventricle has no regional wall motion abnormalities. The left ventricular internal cavity size was normal in size. There is  no left ventricular hypertrophy. Left ventricular diastolic parameters are consistent with Grade I diastolic dysfunction (impaired relaxation). Normal left ventricular filling pressure. Right Ventricle: The right ventricular size is normal. Right vetricular wall thickness was not assessed. Right ventricular systolic function is normal. Left Atrium: Left atrial size was normal in size. Right Atrium: Right atrial size was mildly dilated. Pericardium: There is no evidence of pericardial effusion. Mitral Valve: The mitral valve is normal in structure. No evidence of mitral valve regurgitation. No evidence of mitral valve stenosis. Tricuspid Valve: The tricuspid valve is normal in structure. Tricuspid valve regurgitation is mild to moderate. Aortic Valve: The aortic valve is tricuspid. Aortic valve regurgitation is mild. Aortic regurgitation PHT measures 602 msec. No aortic stenosis is present. Aortic valve mean gradient measures 3.2 mmHg. Aortic valve peak gradient measures 5.3 mmHg. Aortic  valve area, by VTI measures 2.64 cm. Pulmonic Valve: The pulmonic valve was not well visualized. Pulmonic valve regurgitation is not visualized. No evidence of pulmonic stenosis. Aorta: The aortic root is normal in size and structure. Pulmonary Artery: Moderate  pulmonary HTN, PASP is 42 mmHg. Venous: The inferior vena cava is dilated in size with less than 50% respiratory variability, suggesting right atrial pressure of 15 mmHg. IAS/Shunts: No atrial level shunt detected by color flow Doppler. Additional Comments: A device lead is visualized.  LEFT VENTRICLE PLAX 2D LVIDd:         3.99 cm  Diastology LVIDs:         2.80 cm  LV e' medial:    8.08 cm/s LV PW:         0.85 cm  LV E/e' medial:  8.1 LV IVS:        0.95 cm  LV e' lateral:   6.10 cm/s LVOT diam:     2.10 cm  LV E/e' lateral: 10.7 LV SV:         66 LV SV Index:   39 LVOT Area:     3.46 cm  RIGHT VENTRICLE RV Basal diam:  2.96 cm RV S prime:     9.82 cm/s TAPSE (  M-mode): 2.2 cm LEFT ATRIUM           Index       RIGHT ATRIUM           Index LA diam:      2.80 cm 1.66 cm/m  RA Area:     17.80 cm LA Vol (A4C): 45.2 ml 26.80 ml/m RA Volume:   50.40 ml  29.88 ml/m  AORTIC VALVE AV Area (Vmax):    2.53 cm AV Area (Vmean):   2.36 cm AV Area (VTI):     2.64 cm AV Vmax:           114.67 cm/s AV Vmean:          84.824 cm/s AV VTI:            0.251 m AV Peak Grad:      5.3 mmHg AV Mean Grad:      3.2 mmHg LVOT Vmax:         83.90 cm/s LVOT Vmean:        57.700 cm/s LVOT VTI:          0.191 m LVOT/AV VTI ratio: 0.76 AI PHT:            602 msec  AORTA Ao Root diam: 2.90 cm MITRAL VALVE MV Area (PHT): 2.57 cm    SHUNTS MV Decel Time: 295 msec    Systemic VTI:  0.19 m MV E velocity: 65.50 cm/s  Systemic Diam: 2.10 cm MV A velocity: 82.00 cm/s MV E/A ratio:  0.80 Dina Rich MD Electronically signed by Dina Rich MD Signature Date/Time: 03/25/2021/11:51:30 AM    Final     Micro Results   Recent Results (from the past 240 hour(s))  Resp Panel by RT-PCR (Flu A&B, Covid) Nasopharyngeal Swab     Status: None   Collection Time: 03/21/21 12:18 PM   Specimen: Nasopharyngeal Swab; Nasopharyngeal(NP) swabs in vial transport medium  Result Value Ref Range Status   SARS Coronavirus 2 by RT PCR NEGATIVE NEGATIVE  Final    Comment: (NOTE) SARS-CoV-2 target nucleic acids are NOT DETECTED.  The SARS-CoV-2 RNA is generally detectable in upper respiratory specimens during the acute phase of infection. The lowest concentration of SARS-CoV-2 viral copies this assay can detect is 138 copies/mL. A negative result does not preclude SARS-Cov-2 infection and should not be used as the sole basis for treatment or other patient management decisions. A negative result may occur with  improper specimen collection/handling, submission of specimen other than nasopharyngeal swab, presence of viral mutation(s) within the areas targeted by this assay, and inadequate number of viral copies(<138 copies/mL). A negative result must be combined with clinical observations, patient history, and epidemiological information. The expected result is Negative.  Fact Sheet for Patients:  BloggerCourse.com  Fact Sheet for Healthcare Providers:  SeriousBroker.it  This test is no t yet approved or cleared by the [place] FDA and  has been authorized for detection and/or diagnosis of SARS-CoV-2 by FDA under an Emergency Use Authorization (EUA). This EUA will remain  in effect (meaning this test can be used) for the duration of the COVID-19 declaration under Section 564(b)(1) of the Act, 21 U.S.C.section 360bbb-3(b)(1), unless the authorization is terminated  or revoked sooner.       Influenza A by PCR NEGATIVE NEGATIVE Final   Influenza B by PCR NEGATIVE NEGATIVE Final    Comment: (NOTE) The Xpert Xpress SARS-CoV-2/FLU/RSV plus assay is intended as an aid in the diagnosis of  influenza from Nasopharyngeal swab specimens and should not be used as a sole basis for treatment. Nasal washings and aspirates are unacceptable for Xpert Xpress SARS-CoV-2/FLU/RSV testing.  Fact Sheet for Patients: BloggerCourse.com  Fact Sheet for Healthcare  Providers: SeriousBroker.it  This test is not yet approved or cleared by the [place] FDA and has been authorized for detection and/or diagnosis of SARS-CoV-2 by FDA under an Emergency Use Authorization (EUA). This EUA will remain in effect (meaning this test can be used) for the duration of the COVID-19 declaration under Section 564(b)(1) of the Act, 21 U.S.C. section 360bbb-3(b)(1), unless the authorization is terminated or revoked.  Performed at [facility], [address] [place], [place] [address]   Culture, blood (single)     Status: None (Preliminary result)   Collection Time: 03/21/21 12:35 PM   Specimen: BLOOD RIGHT ARM  Result Value Ref Range Status   Specimen Description   Final    BLOOD RIGHT ARM BOTTLES DRAWN AEROBIC AND ANAEROBIC   Special Requests Blood Culture adequate volume  Final   Culture   Final    NO GROWTH 4 DAYS Performed at [facility], [address], [place], [place] [address]    Report Status PENDING  Incomplete  Culture, blood (single)     Status: None (Preliminary result)   Collection Time: 03/21/21  2:10 PM   Specimen: BLOOD  Result Value Ref Range Status   Specimen Description BLOOD BLOOD LEFT ARM  Final   Special Requests   Final    Blood Culture adequate volume BOTTLES DRAWN AEROBIC AND ANAEROBIC   Culture   Final    NO GROWTH 4 DAYS Performed at [facility], [address] [place], [place] [address]    Report Status PENDING  Incomplete  Resp Panel by RT-PCR (Flu A&B, Covid) Nasopharyngeal Swab     Status: None   Collection Time: 03/24/21 11:43 PM   Specimen: Nasopharyngeal Swab; Nasopharyngeal(NP) swabs in vial transport medium  Result Value Ref Range Status   SARS Coronavirus 2 by RT PCR NEGATIVE NEGATIVE Final    Comment: (NOTE) SARS-CoV-2 target nucleic acids are NOT DETECTED.  The SARS-CoV-2 RNA is generally detectable in upper respiratory specimens during the acute phase of infection. The  lowest concentration of SARS-CoV-2 viral copies this assay can detect is 138 copies/mL. A negative result does not preclude SARS-Cov-2 infection and should not be used as the sole basis for treatment or other patient management decisions. A negative result may occur with  improper specimen collection/handling, submission of specimen other than nasopharyngeal swab, presence of viral mutation(s) within the areas targeted by this assay, and inadequate number of viral copies(<138 copies/mL). A negative result must be combined with clinical observations, patient history, and epidemiological information. The expected result is Negative.  Fact Sheet for Patients:  BloggerCourse.com  Fact Sheet for Healthcare Providers:  SeriousBroker.it  This test is no t yet approved or cleared by the [place] FDA and  has been authorized for detection and/or diagnosis of SARS-CoV-2 by FDA under an Emergency Use Authorization (EUA). This EUA will remain  in effect (meaning this test can be used) for the duration of the COVID-19 declaration under Section 564(b)(1) of the Act, 21 U.S.C.section 360bbb-3(b)(1), unless the authorization is terminated  or revoked sooner.       Influenza A by PCR NEGATIVE NEGATIVE Final   Influenza B by PCR NEGATIVE NEGATIVE Final    Comment: (NOTE) The Xpert Xpress SARS-CoV-2/FLU/RSV plus assay is intended as  an aid in the diagnosis of influenza from Nasopharyngeal swab specimens and should not be used as a sole basis for treatment. Nasal washings and aspirates are unacceptable for Xpert Xpress SARS-CoV-2/FLU/RSV testing.  Fact Sheet for Patients: BloggerCourse.com  Fact Sheet for Healthcare Providers: SeriousBroker.it  This test is not yet approved or cleared by the [place] FDA and has been authorized for detection and/or diagnosis of SARS-CoV-2 by FDA under  an Emergency Use Authorization (EUA). This EUA will remain in effect (meaning this test can be used) for the duration of the COVID-19 declaration under Section 564(b)(1) of the Act, 21 U.S.C. section 360bbb-3(b)(1), unless the authorization is terminated or revoked.  Performed at [facility], [address], [place], [place] [address]        Today   Subjective    Denise Espinoza today has no new concerns  No fever  Or chills   No Nausea, Vomiting or Diarrhea        Patient has been seen and examined prior to discharge   Objective   Blood pressure 111/68, pulse (!) 102, temperature 98.2 F (36.8 C), temperature source Oral, resp. rate 18, height 5\' 3"  (1.6 m), weight 65.8 kg, SpO2 95 %.   Intake/Output Summary (Last 24 hours) at 03/25/2021 1244 Last data filed at 03/25/2021 1008 Gross per 24 hour  Intake 46.86 ml  Output --  Net 46.86 ml   Exam Gen:- Awake Alert, no acute distress, speaking in complete sentences HEENT:- Central.AT, No sclera icterus Neck-Supple Neck,No JVD,.  Lungs-  CTAB , good air movement bilaterally  CV- S1, S2 normal, regular Abd-  +ve B.Sounds, Abd Soft, No tenderness,    Extremity/Skin:- No  edema,   good pulses Psych-affect is appropriate, oriented x3 Neuro-generalized weakness, no new focal deficits, no tremors    Data Review   CBC w Diff:  Lab Results  Component Value Date   WBC 3.9 (L) 03/25/2021   HGB 11.0 (L) 03/25/2021   HCT 34.3 (L) 03/25/2021   PLT 166 03/25/2021   LYMPHOPCT 29 03/24/2021   MONOPCT 8 03/24/2021   EOSPCT 3 03/24/2021   BASOPCT 0 03/24/2021    CMP:  Lab Results  Component Value Date   NA 141 03/25/2021   K 3.5 03/25/2021   CL 108 03/25/2021   CO2 26 03/25/2021   BUN 21 03/25/2021   CREATININE 0.79 03/25/2021   PROT 6.1 (L) 03/25/2021   ALBUMIN 3.1 (L) 03/25/2021   BILITOT 0.3 03/25/2021   ALKPHOS 65 03/25/2021   AST 16 03/25/2021   ALT 16 03/25/2021  . Total Discharge time is about 33  minutes  Shon Hale M.D on 03/25/2021 at 12:44 PM  Go to [email or web address] -  for contact info  [facility] - Office  [phone]

## 2021-03-26 LAB — CULTURE, BLOOD (SINGLE)
Culture: NO GROWTH
Culture: NO GROWTH
Special Requests: ADEQUATE
Special Requests: ADEQUATE

## 2021-05-24 ENCOUNTER — Ambulatory Visit (INDEPENDENT_AMBULATORY_CARE_PROVIDER_SITE_OTHER): Payer: Medicare Other

## 2021-05-24 DIAGNOSIS — I495 Sick sinus syndrome: Secondary | ICD-10-CM

## 2021-05-24 LAB — CUP PACEART REMOTE DEVICE CHECK
Battery Remaining Longevity: 157 mo
Battery Voltage: 3.04 V
Brady Statistic AP VP Percent: 1.87 %
Brady Statistic AP VS Percent: 19.96 %
Brady Statistic AS VP Percent: 2.55 %
Brady Statistic AS VS Percent: 75.63 %
Brady Statistic RA Percent Paced: 21.03 %
Brady Statistic RV Percent Paced: 4.41 %
Date Time Interrogation Session: 20220815194524
Implantable Lead Implant Date: 20200814
Implantable Lead Implant Date: 20200814
Implantable Lead Location: 753859
Implantable Lead Location: 753860
Implantable Lead Model: 5076
Implantable Lead Model: 5076
Implantable Pulse Generator Implant Date: 20200814
Lead Channel Impedance Value: 304 Ohm
Lead Channel Impedance Value: 342 Ohm
Lead Channel Impedance Value: 361 Ohm
Lead Channel Impedance Value: 380 Ohm
Lead Channel Pacing Threshold Amplitude: 0.625 V
Lead Channel Pacing Threshold Amplitude: 1 V
Lead Channel Pacing Threshold Pulse Width: 0.4 ms
Lead Channel Pacing Threshold Pulse Width: 0.4 ms
Lead Channel Sensing Intrinsic Amplitude: 1 mV
Lead Channel Sensing Intrinsic Amplitude: 1 mV
Lead Channel Sensing Intrinsic Amplitude: 16 mV
Lead Channel Sensing Intrinsic Amplitude: 16 mV
Lead Channel Setting Pacing Amplitude: 1.5 V
Lead Channel Setting Pacing Amplitude: 2.5 V
Lead Channel Setting Pacing Pulse Width: 0.4 ms
Lead Channel Setting Sensing Sensitivity: 2 mV

## 2021-06-14 NOTE — Progress Notes (Signed)
Remote pacemaker transmission.   

## 2021-06-28 ENCOUNTER — Encounter: Payer: Self-pay | Admitting: Family Medicine

## 2021-06-28 ENCOUNTER — Ambulatory Visit: Payer: Medicare Other | Admitting: Family Medicine

## 2021-06-28 NOTE — Progress Notes (Deleted)
PATIENT: Denise Espinoza DOB: [birth year]  REASON FOR VISIT: follow up HISTORY FROM: patient  No chief complaint on file.    HISTORY OF PRESENT ILLNESS: 06/28/21 ALL:  Denise Espinoza returns for follow up.   12/23/2020 ALL: Denise Espinoza returns for follow up. She was last seen 02/2020 and doing very well. Levetiracetam was weaned as no seizure like activity was seen following placement of pacemaker. She weaned dose to 250 BID for a period of time but has not taken consistently for several months. She may take 250mg  daily 1-2 times a week. No seizure activity. Dizziness has significantly improved following pacemaker placement. She is followed by cardiology regularly.   03/04/2020 ALL:  Denise Espinoza is a 87 y.o. female here today for follow up for questionable seizure diagnosis. She has continues levetiracetam 500mg  twice daily and is tolerating it well. She has not had any difficulty with speech changes or other seizure like activity since pacemaker placement in 05/2019. She was started on AED empirically following word finding difficulties last year. She does continue to have orthostatic dizziness but states that it is intermittent and mild. She is very active. She lives with her granddaughter. She does drive without difficulty.   HISTORY: (copied from Dr note on 07/09/2019)  87 year old female here for evaluation of seizure.  History of C2 fracture (displaced type 2 dens fracture) and myelopathy in March 2020 status post laminectomy and fusion (ORIF) in April 2020.  Thereafter patient started to have intermittent episodes of dizziness and lightheadedness.  In August 2020 patient had episode of word finding difficulties and was taken to the hospital for stroke evaluation.  No acute findings on MRI of the brain.  Possibility of seizure was raised and EEG was unremarkable.  Patient was empirically started on Keppra for seizure prevention.  Patient was also found to have orthostatic hypotension, dizziness,  syncope associated with 2 to 3-second pause on telemetry.  Ultimately patient was treated with dual-chamber pacemaker placement on 05/23/2019.  Since that time no further spells or events.   Tolerating levetiracetam.  REVIEW OF SYSTEMS: Out of a complete 14 system review of symptoms, the patient complains only of the following symptoms, dizziness, and all other reviewed systems are negative.  ALLERGIES: Allergies  Allergen Reactions   Penicillins Hives, Swelling, Rash and Other (See Comments)    Did it involve swelling of the face/tongue/throat, SOB, or low BP? #  #  #  YES  #  #  #  Did it involve sudden or severe rash/hives, skin peeling, or any reaction on the inside of your mouth or nose? No Did you need to seek medical attention at a hospital or doctor's office? #  #  #  YES  #  #  #  When did it last happen?    87 years old.    Codeine Phosphate [Codeine] Other (See Comments)    headache    HOME MEDICATIONS: Outpatient Medications Prior to Visit  Medication Sig Dispense Refill   acetaminophen (TYLENOL) 325 MG tablet Take 1-2 tablets (325-650 mg total) by mouth every 4 (four) hours as needed for mild pain.     Dextran 70-Hypromellose (ARTIFICIAL TEARS) 0.1-0.3 % SOLN Place 1 drop into both eyes 3 (three) times daily as needed.     furosemide (LASIX) 20 MG tablet Take 1 tablet (20 mg total) by mouth daily. 30 tablet 1   gabapentin (NEURONTIN) 300 MG capsule TAKE 1 CAPSULE BY MOUTH THREE TIMES DAILY 90 capsule 0  metoprolol tartrate (LOPRESSOR) 25 MG tablet Take 1 tablet (25 mg total) by mouth 2 (two) times daily. 60 tablet 1   mirtazapine (REMERON) 15 MG tablet Take 15 mg by mouth at bedtime.     Multiple Vitamin (MULTIVITAMIN) capsule Take 1 capsule by mouth daily.     NON FORMULARY Diet Type: Regular     Nutritional Supplements (ENSURE ENLIVE PO) Take 1 Bottle by mouth daily. Prefers Vanilla     pantoprazole (PROTONIX) 40 MG tablet Take 1 tablet (40 mg total) by mouth 2 (two)  times daily. 60 tablet 3   potassium chloride (KLOR-CON) 10 MEQ tablet Take 1 tablet (10 mEq total) by mouth daily. Take While taking Lasix/furosemide 30 tablet 1   rosuvastatin (CRESTOR) 5 MG tablet Take 1 tablet (5 mg total) by mouth daily. 30 tablet 0   sennosides-docusate sodium (SENOKOT-S) 8.6-50 MG tablet Take 2 tablets by mouth at bedtime. 60 tablet 3   traMADol (ULTRAM) 50 MG tablet Take 50 mg by mouth 3 (three) times daily as needed.     No facility-administered medications prior to visit.    PAST MEDICAL HISTORY: Past Medical History:  Diagnosis Date   Allergic rhinitis    Anosmia    Arthritis    Full dentures    History of diverticulitis    Insomnia    Seizure (HCC) 2020   Senile osteoporosis    SOB (shortness of breath)    SVT (supraventricular tachycardia) (HCC)    Varicose vein of leg    left   Wears glasses     PAST SURGICAL HISTORY: Past Surgical History:  Procedure Laterality Date   COLON RESECTION  1989   diverticulitis   COLONOSCOPY     ESOPHAGEAL DILATION  2015   endoscopy   PACEMAKER IMPLANT N/A 05/23/2019   Procedure: PACEMAKER IMPLANT;  Surgeon: Regan Lemming, MD;  Location: [facility];  Service: Cardiovascular;  Laterality: N/A;   POSTERIOR CERVICAL FUSION/FORAMINOTOMY N/A 02/11/2019   Procedure: POSTERIOR CERVICAL INSTRUMENTATION REMOVAL CERVICAL TWO;  Surgeon: Lisbeth Renshaw, MD;  Location: [facility] OR;  Service: Neurosurgery;  Laterality: N/A;   POSTERIOR CERVICAL LAMINECTOMY N/A 02/06/2019   Procedure: Cervical one LAMINECTOMY, [place]-Cervical four fusion;  Surgeon: Lisbeth Renshaw, MD;  Location: [facility] OR;  Service: Neurosurgery;  Laterality: N/A;    FAMILY HISTORY: Family History  Problem Relation Age of Onset   Cancer Mother    High blood pressure Father    Emphysema Brother     SOCIAL HISTORY: Social History   Socioeconomic History   Marital status: Widowed    Spouse name: Not on file   Number of children: 2   Years  of education: 78   Highest education level: Not on file  Occupational History   Occupation: Retired  Tobacco Use   Smoking status: Never   Smokeless tobacco: Never  Substance and Sexual Activity   Alcohol use: Yes    Comment: rare   Drug use: No   Sexual activity: Not on file  Other Topics Concern   Not on file  Social History Narrative   Lives w/grand dgtr, son   Caffeine- coffee 1 cup daily   Social Determinants of Health   Financial Resource Strain: Not on file  Food Insecurity: Not on file  Transportation Needs: Not on file  Physical Activity: Not on file  Stress: Not on file  Social Connections: Not on file  Intimate Partner Violence: Not on file      PHYSICAL EXAM  There were no vitals filed for this visit.  There is no height or weight on file to calculate BMI.  Generalized: Well developed, in no acute distress  Cardiology: normal rate, irregular rhythm (noted sinus node dysfunction with pace maker, asymptomatic) no murmur noted Respiratory: clear to auscultation bilaterally  Neurological examination  Mentation: Alert oriented to time, place, history taking. Follows all commands speech and language fluent Cranial nerve II-XII: Pupils were equal round reactive to light. Extraocular movements were full, visual field were full Motor: The motor testing reveals 5 over 5 strength of all 4 extremities. Good symmetric motor tone is noted throughout.  Gait and station: Gait is normal.   DIAGNOSTIC DATA (LABS, IMAGING, TESTING) - I reviewed patient records, labs, notes, testing and imaging myself where available.  No flowsheet data found.   Lab Results  Component Value Date   WBC 3.9 (L) 03/25/2021   HGB 11.0 (L) 03/25/2021   HCT 34.3 (L) 03/25/2021   MCV 95.0 03/25/2021   PLT 166 03/25/2021      Component Value Date/Time   NA 141 03/25/2021 0432   K 3.5 03/25/2021 0432   CL 108 03/25/2021 0432   CO2 26 03/25/2021 0432   GLUCOSE 96 03/25/2021 0432   BUN  21 03/25/2021 0432   CREATININE 0.79 03/25/2021 0432   CALCIUM 8.7 (L) 03/25/2021 0432   PROT 6.1 (L) 03/25/2021 0432   ALBUMIN 3.1 (L) 03/25/2021 0432   AST 16 03/25/2021 0432   ALT 16 03/25/2021 0432   ALKPHOS 65 03/25/2021 0432   BILITOT 0.3 03/25/2021 0432   GFRNONAA >60 03/25/2021 0432   GFRAA >60 05/19/2019 0619   Lab Results  Component Value Date   CHOL 148 05/18/2019   HDL 58 05/18/2019   LDLCALC 76 05/18/2019   TRIG 70 05/18/2019   CHOLHDL 2.6 05/18/2019   Lab Results  Component Value Date   HGBA1C 5.2 05/18/2019   No results found for: VITAMINB12 Lab Results  Component Value Date   TSH 0.659 05/22/2019       ASSESSMENT AND PLAN 87 y.o. year old female  has a past medical history of Allergic rhinitis, Anosmia, Arthritis, Full dentures, History of diverticulitis, Insomnia, Seizure (HCC) (2020), Senile osteoporosis, SOB (shortness of breath), SVT (supraventricular tachycardia) (HCC), Varicose vein of leg, and Wears glasses. here with   No diagnosis found.    Mrs Coccia reports that she is doing well today. She has not had any concerns of seizure like activity, specifically word finding difficulty, since dual chamber pacemaker placement in 05/2019. She has tolerated levetiracetam but has not taken consistently for at least 3 months. We will discontinue levetiracetam. No previous history of seizures.  She will monitor symptoms closely and call me with any concerns. She will return for follow up in 6-12 months. Seizure precautions reviewed. She verbalizes understanding and agreement with this plan.    No orders of the defined types were placed in this encounter.    No orders of the defined types were placed in this encounter.     Shawnie Dapper, FNP-C 06/28/2021, 7:09 AM [facility] [address], [address] [place], [place] [address] [phone]

## 2021-08-23 ENCOUNTER — Ambulatory Visit (INDEPENDENT_AMBULATORY_CARE_PROVIDER_SITE_OTHER): Payer: Medicare Other

## 2021-08-23 DIAGNOSIS — I495 Sick sinus syndrome: Secondary | ICD-10-CM | POA: Diagnosis not present

## 2021-08-24 LAB — CUP PACEART REMOTE DEVICE CHECK
Battery Remaining Longevity: 154 mo
Battery Voltage: 3.04 V
Brady Statistic AP VP Percent: 0.47 %
Brady Statistic AP VS Percent: 10.11 %
Brady Statistic AS VP Percent: 1.27 %
Brady Statistic AS VS Percent: 88.16 %
Brady Statistic RA Percent Paced: 10.16 %
Brady Statistic RV Percent Paced: 1.73 %
Date Time Interrogation Session: 20221114204644
Implantable Lead Implant Date: 20200814
Implantable Lead Implant Date: 20200814
Implantable Lead Location: 753859
Implantable Lead Location: 753860
Implantable Lead Model: 5076
Implantable Lead Model: 5076
Implantable Pulse Generator Implant Date: 20200814
Lead Channel Impedance Value: 304 Ohm
Lead Channel Impedance Value: 361 Ohm
Lead Channel Impedance Value: 380 Ohm
Lead Channel Impedance Value: 418 Ohm
Lead Channel Pacing Threshold Amplitude: 0.625 V
Lead Channel Pacing Threshold Amplitude: 0.875 V
Lead Channel Pacing Threshold Pulse Width: 0.4 ms
Lead Channel Pacing Threshold Pulse Width: 0.4 ms
Lead Channel Sensing Intrinsic Amplitude: 0.875 mV
Lead Channel Sensing Intrinsic Amplitude: 0.875 mV
Lead Channel Sensing Intrinsic Amplitude: 21.75 mV
Lead Channel Sensing Intrinsic Amplitude: 21.75 mV
Lead Channel Setting Pacing Amplitude: 1.5 V
Lead Channel Setting Pacing Amplitude: 2.5 V
Lead Channel Setting Pacing Pulse Width: 0.4 ms
Lead Channel Setting Sensing Sensitivity: 2 mV

## 2021-08-31 NOTE — Progress Notes (Signed)
Remote pacemaker transmission.   

## 2021-11-08 ENCOUNTER — Ambulatory Visit (INDEPENDENT_AMBULATORY_CARE_PROVIDER_SITE_OTHER): Payer: Medicare Other | Admitting: Internal Medicine

## 2021-11-08 ENCOUNTER — Encounter: Payer: Self-pay | Admitting: Internal Medicine

## 2021-11-08 ENCOUNTER — Other Ambulatory Visit: Payer: Self-pay

## 2021-11-08 VITALS — BP 140/78 | HR 86 | Ht 63.0 in | Wt 142.0 lb

## 2021-11-08 DIAGNOSIS — I495 Sick sinus syndrome: Secondary | ICD-10-CM | POA: Diagnosis not present

## 2021-11-08 LAB — CUP PACEART INCLINIC DEVICE CHECK
Battery Remaining Longevity: 151 mo
Battery Voltage: 3.04 V
Brady Statistic AP VP Percent: 1.21 %
Brady Statistic AP VS Percent: 16.07 %
Brady Statistic AS VP Percent: 1.86 %
Brady Statistic AS VS Percent: 80.87 %
Brady Statistic RA Percent Paced: 16.62 %
Brady Statistic RV Percent Paced: 3.07 %
Date Time Interrogation Session: 20230131140404
Implantable Lead Implant Date: 20200814
Implantable Lead Implant Date: 20200814
Implantable Lead Location: 753859
Implantable Lead Location: 753860
Implantable Lead Model: 5076
Implantable Lead Model: 5076
Implantable Pulse Generator Implant Date: 20200814
Lead Channel Impedance Value: 285 Ohm
Lead Channel Impedance Value: 342 Ohm
Lead Channel Impedance Value: 342 Ohm
Lead Channel Impedance Value: 380 Ohm
Lead Channel Pacing Threshold Amplitude: 0.5 V
Lead Channel Pacing Threshold Amplitude: 1 V
Lead Channel Pacing Threshold Pulse Width: 0.4 ms
Lead Channel Pacing Threshold Pulse Width: 0.4 ms
Lead Channel Sensing Intrinsic Amplitude: 0.875 mV
Lead Channel Sensing Intrinsic Amplitude: 1.5 mV
Lead Channel Sensing Intrinsic Amplitude: 15.25 mV
Lead Channel Sensing Intrinsic Amplitude: 19.125 mV
Lead Channel Setting Pacing Amplitude: 1.5 V
Lead Channel Setting Pacing Amplitude: 2.5 V
Lead Channel Setting Pacing Pulse Width: 0.4 ms
Lead Channel Setting Sensing Sensitivity: 2 mV

## 2021-11-08 MED ORDER — METOPROLOL TARTRATE 25 MG PO TABS: 12.5000 mg | ORAL_TABLET | Freq: Two times a day (BID) | ORAL | 3 refills | Status: DC

## 2021-11-08 NOTE — Patient Instructions (Signed)
Medication Instructions:  Decrease Toprol XL to 12.5 mg Two Times Daily   *If you need a refill on your cardiac medications before your next appointment, please call your pharmacy*   Lab Work: NONE   If you have labs (blood work) drawn today and your tests are completely normal, you will receive your results only by: [place] (if you have MyChart) OR A paper copy in the mail If you have any lab test that is abnormal or we need to change your treatment, we will call you to review the results.   Testing/Procedures: NONE    Follow-Up: At [facility], you and your health needs are our priority.  As part of our continuing mission to provide you with exceptional heart care, we have created designated Provider Care Teams.  These Care Teams include your primary Cardiologist (physician) and Advanced Practice Providers (APPs -  Physician Assistants and Nurse Practitioners) who all work together to provide you with the care you need, when you need it.  We recommend signing up for the patient portal called "MyChart".  Sign up information is provided on this After Visit Summary.  MyChart is used to connect with patients for Virtual Visits (Telemedicine).  Patients are able to view lab/test results, encounter notes, upcoming appointments, etc.  Non-urgent messages can be sent to your provider as well.   To learn more about what you can do with MyChart, go to [email or web address].    Your next appointment:   1 year(s)  The format for your next appointment:   In Person  Provider:   Cristopher Peru, MD    Other Instructions  Thank you for choosing [facility]!

## 2021-11-08 NOTE — Progress Notes (Signed)
HPI [name] returns today for followup of her PPM. She is a pleasant 88 yo woman with sinus node dysfunction, HTN and orthostasis. When I saw her last she noted that her dizzy spells and improved after her PPM insertion. She now denies dizziness. No chest pain or sob. No syncope. She had cut back on her dose of metoprolol. Allergies  Allergen Reactions   Penicillins Hives, Swelling, Rash and Other (See Comments)    Did it involve swelling of the face/tongue/throat, SOB, or low BP? #  #  #  YES  #  #  #  Did it involve sudden or severe rash/hives, skin peeling, or any reaction on the inside of your mouth or nose? No Did you need to seek medical attention at a hospital or doctor's office? #  #  #  YES  #  #  #  When did it last happen?    88 years old.    Codeine Phosphate [Codeine] Other (See Comments)    headache     Current Outpatient Medications  Medication Sig Dispense Refill   acetaminophen (TYLENOL) 325 MG tablet Take 1-2 tablets (325-650 mg total) by mouth every 4 (four) hours as needed for mild pain.     Dextran 70-Hypromellose (ARTIFICIAL TEARS) 0.1-0.3 % SOLN Place 1 drop into both eyes 3 (three) times daily as needed.     furosemide (LASIX) 20 MG tablet Take 1 tablet (20 mg total) by mouth daily. 30 tablet 1   gabapentin (NEURONTIN) 300 MG capsule TAKE 1 CAPSULE BY MOUTH THREE TIMES DAILY 90 capsule 0   metoprolol tartrate (LOPRESSOR) 25 MG tablet Take 1 tablet (25 mg total) by mouth 2 (two) times daily. 60 tablet 1   mirtazapine (REMERON) 15 MG tablet Take 15 mg by mouth at bedtime.     Multiple Vitamin (MULTIVITAMIN) capsule Take 1 capsule by mouth daily.     NON FORMULARY Diet Type: Regular     Nutritional Supplements (ENSURE ENLIVE PO) Take 1 Bottle by mouth daily. Prefers Vanilla     pantoprazole (PROTONIX) 40 MG tablet Take 1 tablet (40 mg total) by mouth 2 (two) times daily. 60 tablet 3   potassium chloride (KLOR-CON) 10 MEQ tablet Take 1 tablet (10 mEq  total) by mouth daily. Take While taking Lasix/furosemide 30 tablet 1   rosuvastatin (CRESTOR) 5 MG tablet Take 1 tablet (5 mg total) by mouth daily. 30 tablet 0   sennosides-docusate sodium (SENOKOT-S) 8.6-50 MG tablet Take 2 tablets by mouth at bedtime. 60 tablet 3   traMADol (ULTRAM) 50 MG tablet Take 50 mg by mouth 3 (three) times daily as needed.     No current facility-administered medications for this visit.     Past Medical History:  Diagnosis Date   Allergic rhinitis    Anosmia    Arthritis    Full dentures    History of diverticulitis    Insomnia    Seizure (HCC) 2020   Senile osteoporosis    SOB (shortness of breath)    SVT (supraventricular tachycardia) (HCC)    Varicose vein of leg    left   Wears glasses     ROS:   All systems reviewed and negative except as noted in the HPI.   Past Surgical History:  Procedure Laterality Date   COLON RESECTION  1989   diverticulitis   COLONOSCOPY     ESOPHAGEAL DILATION  2015   endoscopy   PACEMAKER IMPLANT N/A  05/23/2019   Procedure: PACEMAKER IMPLANT;  Surgeon: Regan Lemming, MD;  Location: [facility];  Service: Cardiovascular;  Laterality: N/A;   POSTERIOR CERVICAL FUSION/FORAMINOTOMY N/A 02/11/2019   Procedure: POSTERIOR CERVICAL INSTRUMENTATION REMOVAL CERVICAL TWO;  Surgeon: Lisbeth Renshaw, MD;  Location: [facility] OR;  Service: Neurosurgery;  Laterality: N/A;   POSTERIOR CERVICAL LAMINECTOMY N/A 02/06/2019   Procedure: Cervical one LAMINECTOMY, [place]-Cervical four fusion;  Surgeon: Lisbeth Renshaw, MD;  Location: [facility] OR;  Service: Neurosurgery;  Laterality: N/A;     Family History  Problem Relation Age of Onset   Cancer Mother    High blood pressure Father    Emphysema Brother      Social History   Socioeconomic History   Marital status: Widowed    Spouse name: Not on file   Number of children: 2   Years of education: 15   Highest education level: Not on file  Occupational History    Occupation: Retired  Tobacco Use   Smoking status: Never   Smokeless tobacco: Never  Vaping Use   Vaping Use: Never used  Substance and Sexual Activity   Alcohol use: Yes    Comment: rare   Drug use: No   Sexual activity: Not on file  Other Topics Concern   Not on file  Social History Narrative   Lives w/grand dgtr, son   Caffeine- coffee 1 cup daily   Social Determinants of Health   Financial Resource Strain: Not on file  Food Insecurity: Not on file  Transportation Needs: Not on file  Physical Activity: Not on file  Stress: Not on file  Social Connections: Not on file  Intimate Partner Violence: Not on file     BP 140/78    Pulse 86    Ht 5\' 3"  (1.6 m)    Wt 142 lb (64.4 kg)    SpO2 96%    BMI 25.15 kg/m   Physical Exam:  Well appearing NAD HEENT: Unremarkable Neck:  No JVD, no thyromegally Lymphatics:  No adenopathy Back:  No CVA tenderness Lungs:  Clear with no wheezes HEART:  Regular rate rhythm, no murmurs, no rubs, no clicks Abd:  soft, positive bowel sounds, no organomegally, no rebound, no guarding Ext:  2 plus pulses, no edema, no cyanosis, no clubbing Skin:  No rashes no nodules Neuro:  CN II through XII intact, motor grossly intact  EKG - nsr  Assess/Plan:  1. Sinus node dysfunction - she is asymptomatic, s/p PPM insertion.  2. PPM - her [employer] DDD PM is working normally and we will recheck in several months 3. Orthostasis - she is off of her florinef and she has no symptoms. 4. HTN - her bp is not well controlled. No change in meds. I asked her to restart low dose metoprolol.   Sharlot Gowda Brylin Stanislawski,MD

## 2021-11-22 ENCOUNTER — Ambulatory Visit (INDEPENDENT_AMBULATORY_CARE_PROVIDER_SITE_OTHER): Payer: Medicare Other

## 2021-11-22 DIAGNOSIS — I495 Sick sinus syndrome: Secondary | ICD-10-CM

## 2021-11-22 LAB — CUP PACEART REMOTE DEVICE CHECK
Battery Remaining Longevity: 151 mo
Battery Voltage: 3.04 V
Brady Statistic AP VP Percent: 0.57 %
Brady Statistic AP VS Percent: 21.56 %
Brady Statistic AS VP Percent: 0.83 %
Brady Statistic AS VS Percent: 77.03 %
Brady Statistic RA Percent Paced: 21.76 %
Brady Statistic RV Percent Paced: 1.4 %
Date Time Interrogation Session: 20230214043158
Implantable Lead Implant Date: 20200814
Implantable Lead Implant Date: 20200814
Implantable Lead Location: 753859
Implantable Lead Location: 753860
Implantable Lead Model: 5076
Implantable Lead Model: 5076
Implantable Pulse Generator Implant Date: 20200814
Lead Channel Impedance Value: 304 Ohm
Lead Channel Impedance Value: 342 Ohm
Lead Channel Impedance Value: 361 Ohm
Lead Channel Impedance Value: 399 Ohm
Lead Channel Pacing Threshold Amplitude: 0.625 V
Lead Channel Pacing Threshold Amplitude: 0.875 V
Lead Channel Pacing Threshold Pulse Width: 0.4 ms
Lead Channel Pacing Threshold Pulse Width: 0.4 ms
Lead Channel Sensing Intrinsic Amplitude: 0.625 mV
Lead Channel Sensing Intrinsic Amplitude: 0.625 mV
Lead Channel Sensing Intrinsic Amplitude: 15.375 mV
Lead Channel Sensing Intrinsic Amplitude: 15.375 mV
Lead Channel Setting Pacing Amplitude: 1.5 V
Lead Channel Setting Pacing Amplitude: 2.5 V
Lead Channel Setting Pacing Pulse Width: 0.4 ms
Lead Channel Setting Sensing Sensitivity: 2 mV

## 2021-11-28 NOTE — Progress Notes (Signed)
Remote pacemaker transmission.   

## 2022-02-21 ENCOUNTER — Ambulatory Visit (INDEPENDENT_AMBULATORY_CARE_PROVIDER_SITE_OTHER): Payer: Medicare Other

## 2022-02-21 DIAGNOSIS — I495 Sick sinus syndrome: Secondary | ICD-10-CM | POA: Diagnosis not present

## 2022-02-21 LAB — CUP PACEART REMOTE DEVICE CHECK
Battery Remaining Longevity: 148 mo
Battery Voltage: 3.03 V
Brady Statistic AP VP Percent: 0.52 %
Brady Statistic AP VS Percent: 20.62 %
Brady Statistic AS VP Percent: 0.79 %
Brady Statistic AS VS Percent: 78.07 %
Brady Statistic RA Percent Paced: 20.74 %
Brady Statistic RV Percent Paced: 1.31 %
Date Time Interrogation Session: 20230516030542
Implantable Lead Implant Date: 20200814
Implantable Lead Implant Date: 20200814
Implantable Lead Location: 753859
Implantable Lead Location: 753860
Implantable Lead Model: 5076
Implantable Lead Model: 5076
Implantable Pulse Generator Implant Date: 20200814
Lead Channel Impedance Value: 285 Ohm
Lead Channel Impedance Value: 342 Ohm
Lead Channel Impedance Value: 361 Ohm
Lead Channel Impedance Value: 399 Ohm
Lead Channel Pacing Threshold Amplitude: 0.75 V
Lead Channel Pacing Threshold Amplitude: 0.75 V
Lead Channel Pacing Threshold Pulse Width: 0.4 ms
Lead Channel Pacing Threshold Pulse Width: 0.4 ms
Lead Channel Sensing Intrinsic Amplitude: 0.625 mV
Lead Channel Sensing Intrinsic Amplitude: 0.625 mV
Lead Channel Sensing Intrinsic Amplitude: 16.25 mV
Lead Channel Sensing Intrinsic Amplitude: 16.25 mV
Lead Channel Setting Pacing Amplitude: 1.5 V
Lead Channel Setting Pacing Amplitude: 2.5 V
Lead Channel Setting Pacing Pulse Width: 0.4 ms
Lead Channel Setting Sensing Sensitivity: 2 mV

## 2022-03-09 NOTE — Progress Notes (Signed)
Remote pacemaker transmission.   

## 2022-03-16 ENCOUNTER — Encounter: Payer: Self-pay | Admitting: Internal Medicine

## 2022-04-12 NOTE — Progress Notes (Unsigned)
GI Office Note    Referring Provider: Tacy Learn, FNP Primary Care Physician:  Tacy Learn, FNP  Primary GI: Dr. Jena Gauss  Chief Complaint   Chief Complaint  Patient presents with   Dysphagia    Also is having issues with heartburn.     History of Present Illness   Denise Espinoza is a 88 y.o. female presenting today at the request of Tacy Learn, FNP for dysphagia.   Per review of Care Everywhere it appears she was seen on 05/20/21 by [facility] in [place] for slow transit constipation, esophageal dysphagia, GERD without esophagitis.  Unable to review any notes from this visit.  Per referral documentation from PCP with visit note dating 03/09/2022: Last colonoscopy 02/25/2022 with Dr. Virgilio Belling noting normal colonoscopy in no more recommended due to age.  She stated complaint of feeling like food was getting stuck in her throat with swallowing and coughs when she eats frequently. Currently taking pantoprazole 40 mg once daily and over-the-counter Tums occasionally to relieve her heartburn.  Patient had pacemaker placed in August 2020 due to bradycardia and syncope.  Follows with cardiology.  Reported to PCP that she was having dizzy spells at night when she lays down.  Last cardiology visit 11/08/21.  She denied dizziness, chest pain, shortness of breath, or syncope.  Patient had cut back on her dose of metoprolol.  Last EKG with normal sinus rhythm.  Pain pacemaker is [employer] DDD, has regular checks and has been working normally.  BP was not well controlled was advised to restart low-dose metoprolol  (12.5 mg twice daily).   Today: Dysphagia: Solid foods mostly. Has been going on for about 4 months also. Denies issues with medications and liquids. She has been chewing foods adequately and not able to swallow as well. Feeling stuck of having difficulty at the base of the neck. Food sometimes regurgitates but she swallows it back.  Denies feelings of choking. Has to clear her throat fairly often as well. Has limited neck movements as well.  She reports she does not go to the doctor very often and does not believe she has ever had an upper endoscopy.  GERD: Denies nausea or vomiting. Denies abdominal pain. Has been having more burning in her throat and chest area. Symptoms worse at night. Has been taking pepto bismol for the last 4 months. Previously took pantoprazole but stopped taking it cause she ran out of refills and did not let her PCP know she needed refills. Denies lack of appetite, unintentional weight loss, early satiety.   Seen GI last year in [place] but had a terrible experience. She was having trouble swallowing then too as well as the reflux.   Has had constipation and diarrhea. Has had some minor incontinence/fecal seepage. Has had urinary incontinence as well. Does have to strain some to have a BM. Denies melena or hematochezia. Stools are soft, not watery. Has had to use wipes to get herself nice and clean. Denies itching or burning. Loves salads, sweet potatoes etc. unable to remember if she has had a colonoscopy or when her last one was.  Epic history states colonoscopy has been performed but unsure timing.  Previous history of colon resection in 1989 due to diverticulitis.  Reportedly had esophageal dilation in 2015 with no report available.  Past Medical History:  Diagnosis Date   Allergic rhinitis    Anosmia    Arthritis    Full dentures    History of diverticulitis  Insomnia    Seizure (HCC) 2020   Senile osteoporosis    SOB (shortness of breath)    SVT (supraventricular tachycardia) (HCC)    Varicose vein of leg    left   Wears glasses     Past Surgical History:  Procedure Laterality Date   COLON RESECTION  1989   diverticulitis   COLONOSCOPY     ESOPHAGEAL DILATION  2015   endoscopy   PACEMAKER IMPLANT N/A 05/23/2019   Procedure: PACEMAKER IMPLANT;  Surgeon: Regan Lemming,  MD;  Location: [facility];  Service: Cardiovascular;  Laterality: N/A;   POSTERIOR CERVICAL FUSION/FORAMINOTOMY N/A 02/11/2019   Procedure: POSTERIOR CERVICAL INSTRUMENTATION REMOVAL CERVICAL TWO;  Surgeon: Lisbeth Renshaw, MD;  Location: [facility];  Service: Neurosurgery;  Laterality: N/A;   POSTERIOR CERVICAL LAMINECTOMY N/A 02/06/2019   Procedure: Cervical one LAMINECTOMY, Occiput-Cervical four fusion;  Surgeon: Lisbeth Renshaw, MD;  Location: [facility];  Service: Neurosurgery;  Laterality: N/A;    Current Outpatient Medications  Medication Sig Dispense Refill   acetaminophen (TYLENOL) 325 MG tablet Take 1-2 tablets (325-650 mg total) by mouth every 4 (four) hours as needed for mild pain.     BELSOMRA 10 MG TABS Take 1 tablet by mouth at bedtime.     Dextran 70-Hypromellose (ARTIFICIAL TEARS) 0.1-0.3 % SOLN Place 1 drop into both eyes 3 (three) times daily as needed.     donepezil (ARICEPT) 5 MG tablet Take 1 tablet every day by oral route.     metoprolol tartrate (LOPRESSOR) 25 MG tablet Take 0.5 tablets (12.5 mg total) by mouth 2 (two) times daily. 45 tablet 3   pantoprazole (PROTONIX) 40 MG tablet Take 1 tablet (40 mg total) by mouth daily. 90 tablet 3   rosuvastatin (CRESTOR) 5 MG tablet Take 1 tablet (5 mg total) by mouth daily. 30 tablet 0   tolterodine (DETROL) 1 MG tablet Take 1 mg by mouth 2 (two) times daily.     traMADol (ULTRAM) 50 MG tablet Take 50 mg by mouth 3 (three) times daily as needed.     No current facility-administered medications for this visit.    Allergies as of 04/13/2022 - Review Complete 04/13/2022  Allergen Reaction Noted   Penicillins Hives, Swelling, Rash, and Other (See Comments) 02/12/2017   Codeine phosphate [codeine] Other (See Comments) 02/12/2017    Family History  Problem Relation Age of Onset   Cancer Mother    High blood pressure Father    Emphysema Brother     Social History   Socioeconomic History   Marital status: Widowed     Spouse name: Not on file   Number of children: 2   Years of education: 36   Highest education level: Not on file  Occupational History   Occupation: Retired  Tobacco Use   Smoking status: Never   Smokeless tobacco: Never  Vaping Use   Vaping Use: Never used  Substance and Sexual Activity   Alcohol use: Yes    Comment: rare   Drug use: No   Sexual activity: Not Currently  Other Topics Concern   Not on file  Social History Narrative   Lives w/grand dgtr, son   Caffeine- coffee 1 cup daily   Social Determinants of Health   Financial Resource Strain: Not on file  Food Insecurity: Not on file  Transportation Needs: Not on file  Physical Activity: Not on file  Stress: Not on file  Social Connections: Not on file  Intimate Partner Violence:  Not on file     Review of Systems   Gen: Denies any fever, chills, fatigue, weight loss, lack of appetite.  CV: Denies chest pain, heart palpitations, peripheral edema, syncope.  Resp: Denies shortness of breath at rest or with exertion. Denies wheezing or cough.  GI: see HPI GU : Denies urinary burning, urinary frequency, urinary hesitancy MS: Denies joint pain, muscle weakness, cramps, or limitation of movement.  Derm: Denies rash, itching, dry skin Psych: Denies depression, anxiety, memory loss, and confusion Heme: Denies bruising, bleeding, and enlarged lymph nodes.   Physical Exam   BP (!) 158/88 (BP Location: Left Arm, Patient Position: Sitting, Cuff Size: Large)   Pulse 95   Temp 97.8 F (36.6 C) (Temporal)   Ht 5\' 3"  (1.6 m)   Wt 144 lb 12.8 oz (65.7 kg)   SpO2 96%   BMI 25.65 kg/m    Original BP 182/101 in left arm.  General:   Alert and oriented. Pleasant and cooperative. Well-nourished and well-developed.  Head:  Normocephalic and atraumatic. Eyes:  Without icterus, sclera clear and conjunctiva pink.  Ears:  Normal auditory acuity. Mouth:  No deformity or lesions, oral mucosa pink.  Lungs:  Clear to auscultation  bilaterally. No wheezes, rales, or rhonchi. No distress.  Heart:  S1, S2 present without murmurs appreciated.  Abdomen:  +BS, soft, non-tender and non-distended. No HSM noted. No guarding or rebound. No masses appreciated.  Rectal:  Deferred  Msk:  Symmetrical without gross deformities. Normal posture. Extremities:  Without edema. Neurologic:  Alert and  oriented x4;  grossly normal neurologically. Skin:  Intact without significant lesions or rashes. Psych:  Alert and cooperative. Normal mood and affect.   Assessment   Aashritha Englander is a 88 y.o. female with a history of prediabetes, insomnia, paroxysmal atrial tachycardia, SSS s/p pacemaker 05/2019, degenerative joint disease, vitamin D deficiency, osteoporosis, GERD, HLD, HTN, varicose veins, and chronic pain presenting today for consult for dysphagia.  Dysphagia: Patient reportedly saw a GI in 06/2019 for she states she was there and seeing them for only a few minutes and did not really speak to her during the visit, she left without any prescription medications or scheduling any procedures/tests and was told to come back in a few weeks as did not have any records to review.  She states she had a bad experience and decided to follow-up here instead.  She was having issues with dysphagia at that time but worse over the last 4 months she has been having more solid food dysphagia.  Denies any pill or liquid dysphagia.  Foods feeling they have been stuck at the base of her neck, sometimes will regurgitate food but then able to swallow back again without spitting it out.  Thinks is a sometimes her food does not have enough moisture that is why she has difficulty swallowing.  Denies feelings of choking, but she does have limited range of motion in her neck and does have to clear her throat often.  Of note she has been off of her pantoprazole as stated below.  Advised to resume this today and for least invasive evaluation we will proceed with barium pill  esophagram to further delineate motility versus structural abnormality such as esophageal webbing, Schatzki's ring, or stenosis.  We discussed dysphagia precautions including ensuring she is adequately chewing her food and alternating with small sips of liquids.  Epic history notes esophageal dilation in 2015, no report available and patient unable to remember having this performed  or where it was performed.  We will proceed with BPE for evaluation prior to determining need for EGD/ED given her age.  GERD: Previously on pantoprazole 40 mg daily, will use she ran refills and did not notify her PCP to get refills therefore she has not been taking it. Having worsening reflux symptoms such as burning in her throat and chest, denies abdominal pain, nausea, or vomiting.  Has been taking Pepto-Bismol frequently for the past 4 months for relief.  Known history of osteoporosis and Vitamin D deficiency. Would recommend calcium and vitamin D supplementation.  Resume pantoprazole 40 mg daily, refilled today.   PLAN   Barium pill esophagram Continue pantoprazole 40 mg daily Recommend calcium and Vit D supplementation Increase fiber intake, Benefiber 2 teaspoons daily. Follow up in 3 months.   Brooke Bonito, MSN, FNP-BC, AGACNP-BC [facility]

## 2022-04-13 ENCOUNTER — Encounter: Payer: Self-pay | Admitting: Gastroenterology

## 2022-04-13 ENCOUNTER — Telehealth: Payer: Self-pay | Admitting: *Deleted

## 2022-04-13 ENCOUNTER — Ambulatory Visit (INDEPENDENT_AMBULATORY_CARE_PROVIDER_SITE_OTHER): Payer: Medicare Other | Admitting: Gastroenterology

## 2022-04-13 VITALS — BP 158/88 | HR 95 | Temp 97.8°F | Ht 63.0 in | Wt 144.8 lb

## 2022-04-13 DIAGNOSIS — R1310 Dysphagia, unspecified: Secondary | ICD-10-CM

## 2022-04-13 DIAGNOSIS — K219 Gastro-esophageal reflux disease without esophagitis: Secondary | ICD-10-CM | POA: Diagnosis not present

## 2022-04-13 MED ORDER — PANTOPRAZOLE SODIUM 40 MG PO TBEC: 40.0000 mg | DELAYED_RELEASE_TABLET | Freq: Every day | ORAL | 3 refills | Status: DC

## 2022-04-13 NOTE — Patient Instructions (Signed)
We are scheduling you for a barium pill esophagram to further assess your difficulty swallowing.  I want you to begin taking pantoprazole 40 mg daily, 30 minutes prior to breakfast.  I have sent this into your pharmacy.  I will also want you to increase your fiber intake, supplementing it with Benefiber 2 teaspoons daily.  You can mix this in your coffee/water/juice what ever does not like to drink as it is tasteless.  This will hopefully help with some of the fecal seepage/incontinence.  I will have you follow-up in about 3 months we will be in touch with results of your barium pill esophagram.  It was a pleasure to see you today. I want to create trusting relationships with patients. If you receive a survey regarding your visit,  I greatly appreciate you taking time to fill this out on paper or through your MyChart. I value your feedback.  Brooke Bonito, MSN, FNP-BC, AGACNP-BC [facility]

## 2022-04-13 NOTE — Telephone Encounter (Signed)
Called pt, Left detailed VM with BPE appt details and # to CS if they need to reschedule

## 2022-04-19 ENCOUNTER — Telehealth: Payer: Self-pay | Admitting: Internal Medicine

## 2022-04-19 NOTE — Telephone Encounter (Signed)
Patient called asking when her endoscopy would be scheduled.

## 2022-04-19 NOTE — Telephone Encounter (Signed)
LMVOM to call back. She is not scheduled for endoscopy

## 2022-04-20 ENCOUNTER — Other Ambulatory Visit (HOSPITAL_COMMUNITY): Payer: Medicare Other

## 2022-04-21 ENCOUNTER — Telehealth: Payer: Self-pay | Admitting: Internal Medicine

## 2022-04-21 NOTE — Telephone Encounter (Signed)
Pt returning call. [phone]

## 2022-04-24 NOTE — Telephone Encounter (Signed)
LMOVM to call back 

## 2022-04-24 NOTE — Telephone Encounter (Signed)
See prior note

## 2022-05-23 ENCOUNTER — Ambulatory Visit (INDEPENDENT_AMBULATORY_CARE_PROVIDER_SITE_OTHER): Payer: Medicare Other

## 2022-05-23 DIAGNOSIS — I495 Sick sinus syndrome: Secondary | ICD-10-CM | POA: Diagnosis not present

## 2022-05-23 LAB — CUP PACEART REMOTE DEVICE CHECK
Battery Remaining Longevity: 144 mo
Battery Voltage: 3.03 V
Brady Statistic AP VP Percent: 0.7 %
Brady Statistic AP VS Percent: 20.12 %
Brady Statistic AS VP Percent: 0.73 %
Brady Statistic AS VS Percent: 78.45 %
Brady Statistic RA Percent Paced: 20.44 %
Brady Statistic RV Percent Paced: 1.43 %
Date Time Interrogation Session: 20230814233112
Implantable Lead Implant Date: 20200814
Implantable Lead Implant Date: 20200814
Implantable Lead Location: 753859
Implantable Lead Location: 753860
Implantable Lead Model: 5076
Implantable Lead Model: 5076
Implantable Pulse Generator Implant Date: 20200814
Lead Channel Impedance Value: 285 Ohm
Lead Channel Impedance Value: 342 Ohm
Lead Channel Impedance Value: 342 Ohm
Lead Channel Impedance Value: 380 Ohm
Lead Channel Pacing Threshold Amplitude: 0.625 V
Lead Channel Pacing Threshold Amplitude: 0.75 V
Lead Channel Pacing Threshold Pulse Width: 0.4 ms
Lead Channel Pacing Threshold Pulse Width: 0.4 ms
Lead Channel Sensing Intrinsic Amplitude: 0.75 mV
Lead Channel Sensing Intrinsic Amplitude: 0.75 mV
Lead Channel Sensing Intrinsic Amplitude: 16.875 mV
Lead Channel Sensing Intrinsic Amplitude: 16.875 mV
Lead Channel Setting Pacing Amplitude: 1.5 V
Lead Channel Setting Pacing Amplitude: 2.5 V
Lead Channel Setting Pacing Pulse Width: 0.4 ms
Lead Channel Setting Sensing Sensitivity: 2 mV

## 2022-05-29 ENCOUNTER — Telehealth: Payer: Self-pay | Admitting: Internal Medicine

## 2022-05-29 MED ORDER — METOPROLOL TARTRATE 25 MG PO TABS: 12.5000 mg | ORAL_TABLET | Freq: Two times a day (BID) | ORAL | 3 refills | Status: DC

## 2022-05-29 NOTE — Telephone Encounter (Signed)
Refilled as requested  

## 2022-05-29 NOTE — Telephone Encounter (Signed)
*  STAT* If patient is at the pharmacy, call can be transferred to refill team.   1. Which medications need to be refilled? (please list name of each medication and dose if known) metoprolol tartrate (LOPRESSOR) 25 MG tablet  2. Which pharmacy/location (including street and city if local pharmacy) is medication to be sent to? [id number] - [place], [place] - [address]  3. Do they need a 30 day or 90 day supply? 90

## 2022-06-23 NOTE — Progress Notes (Signed)
Remote pacemaker transmission.   

## 2022-07-19 NOTE — Progress Notes (Deleted)
GI Office Note    Referring Provider: Tacy Learn, FNP Primary Care Physician:  Tacy Learn, FNP Primary Gastroenterologist: Gerrit Friends.Rourk, MD   Date:  07/19/2022  ID:  Denise Espinoza, DOB July 18, 1930, MRN [id number]   Chief Complaint   No chief complaint on file.   History of Present Illness  Denise Espinoza is a 88 y.o. female with a history of prediabetes, insomnia, paroxysmal atrial tachycardia, SSS s/p pacemaker 05/2019, degenerative joint disease, vitamin D deficiency, osteoporosis, GERD, HLD, HTN, varicose veins, and chronic pain *** presenting today for follow up.   Per review of Care Everywhere it appears she was seen on 05/20/21 by [facility] in [place] for slow transit constipation, esophageal dysphagia, GERD without esophagitis.  Unable to review any notes from this visit.   Per referral documentation from PCP with visit note dating 03/09/2022: Last colonoscopy 02/25/2022 with Dr. Virgilio Belling noting normal colonoscopy in no more recommended due to age.  She stated complaint of feeling like food was getting stuck in her throat with swallowing and coughs when she eats frequently. Currently taking pantoprazole 40 mg once daily and over-the-counter Tums occasionally to relieve her heartburn.  Patient had pacemaker placed in August 2020 due to bradycardia and syncope.  Follows with cardiology.  Reported to PCP that she was having dizzy spells at night when she lays down.  Epic history states colonoscopy has been performed but unsure timing.  Previous history of colon resection in 1989 due to diverticulitis.  Reportedly had esophageal dilation in 2015 with no report available.  Seen for consultation 04/13/22. Reported 4 months of dysphagia with solids. Some regurgitation of food. Burning in her throat and chest area, taking pepto bismol regularly. Noted some constipation with minor incontinence/fecal seepage. Soft stools and needing to strain  at times. BPE ordered however patient cancelled. Restart  pantoprazole 40 mg daily. Increase fiber intake.   Today:    Current Outpatient Medications  Medication Sig Dispense Refill   acetaminophen (TYLENOL) 325 MG tablet Take 1-2 tablets (325-650 mg total) by mouth every 4 (four) hours as needed for mild pain.     BELSOMRA 10 MG TABS Take 1 tablet by mouth at bedtime.     Dextran 70-Hypromellose (ARTIFICIAL TEARS) 0.1-0.3 % SOLN Place 1 drop into both eyes 3 (three) times daily as needed.     donepezil (ARICEPT) 5 MG tablet Take 1 tablet every day by oral route.     metoprolol tartrate (LOPRESSOR) 25 MG tablet Take 0.5 tablets (12.5 mg total) by mouth 2 (two) times daily. 90 tablet 3   pantoprazole (PROTONIX) 40 MG tablet Take 1 tablet (40 mg total) by mouth daily. 90 tablet 3   rosuvastatin (CRESTOR) 5 MG tablet Take 1 tablet (5 mg total) by mouth daily. 30 tablet 0   tolterodine (DETROL) 1 MG tablet Take 1 mg by mouth 2 (two) times daily.     traMADol (ULTRAM) 50 MG tablet Take 50 mg by mouth 3 (three) times daily as needed.     No current facility-administered medications for this visit.    Past Medical History:  Diagnosis Date   Allergic rhinitis    Anosmia    Arthritis    Full dentures    History of diverticulitis    Insomnia    Seizure (HCC) 2020   Senile osteoporosis    SOB (shortness of breath)    SVT (supraventricular tachycardia) (HCC)    Varicose vein of leg    left  Wears glasses     Past Surgical History:  Procedure Laterality Date   COLON RESECTION  1989   diverticulitis   COLONOSCOPY     ESOPHAGEAL DILATION  2015   endoscopy   PACEMAKER IMPLANT N/A 05/23/2019   Procedure: PACEMAKER IMPLANT;  Surgeon: Constance Haw, MD;  Location: [facility];  Service: Cardiovascular;  Laterality: N/A;   POSTERIOR CERVICAL FUSION/FORAMINOTOMY N/A 02/11/2019   Procedure: POSTERIOR CERVICAL INSTRUMENTATION REMOVAL CERVICAL TWO;  Surgeon: Consuella Lose,  MD;  Location: [facility];  Service: Neurosurgery;  Laterality: N/A;   POSTERIOR CERVICAL LAMINECTOMY N/A 02/06/2019   Procedure: Cervical one LAMINECTOMY, Occiput-Cervical four fusion;  Surgeon: Consuella Lose, MD;  Location: [facility];  Service: Neurosurgery;  Laterality: N/A;    Family History  Problem Relation Age of Onset   Cancer Mother    High blood pressure Father    Emphysema Brother     Allergies as of 07/20/2022 - Review Complete 04/13/2022  Allergen Reaction Noted   Penicillins Hives, Swelling, Rash, and Other (See Comments) 02/12/2017   Codeine phosphate [codeine] Other (See Comments) 02/12/2017    Social History   Socioeconomic History   Marital status: Widowed    Spouse name: Not on file   Number of children: 2   Years of education: 69   Highest education level: Not on file  Occupational History   Occupation: Retired  Tobacco Use   Smoking status: Never   Smokeless tobacco: Never  Vaping Use   Vaping Use: Never used  Substance and Sexual Activity   Alcohol use: Yes    Comment: rare   Drug use: No   Sexual activity: Not Currently  Other Topics Concern   Not on file  Social History Narrative   Lives w/grand dgtr, son   Caffeine- coffee 1 cup daily   Social Determinants of Health   Financial Resource Strain: Not on file  Food Insecurity: Not on file  Transportation Needs: Not on file  Physical Activity: Not on file  Stress: Not on file  Social Connections: Not on file     Review of Systems   Gen: Denies fever, chills, anorexia. Denies fatigue, weakness, weight loss.  CV: Denies chest pain, palpitations, syncope, peripheral edema, and claudication. Resp: Denies dyspnea at rest, cough, wheezing, coughing up blood, and pleurisy. GI: See HPI Derm: Denies rash, itching, dry skin Psych: Denies depression, anxiety, memory loss, confusion. No homicidal or suicidal ideation.  Heme: Denies bruising, bleeding, and enlarged lymph nodes.   Physical Exam    There were no vitals taken for this visit.  General:   Alert and oriented. No distress noted. Pleasant and cooperative.  Head:  Normocephalic and atraumatic. Eyes:  Conjuctiva clear without scleral icterus. Mouth:  Oral mucosa pink and moist. Good dentition. No lesions. Lungs:  Clear to auscultation bilaterally. No wheezes, rales, or rhonchi. No distress.  Heart:  S1, S2 present without murmurs appreciated.  Abdomen:  +BS, soft, non-tender and non-distended. No rebound or guarding. No HSM or masses noted. Rectal: *** Msk:  Symmetrical without gross deformities. Normal posture. Extremities:  Without edema. Neurologic:  Alert and  oriented x4 Psych:  Alert and cooperative. Normal mood and affect.   Assessment  Denise Espinoza is a 88 y.o. female with a history of prediabetes, insomnia, paroxysmal atrial tachycardia, SSS s/p pacemaker 05/2019, degenerative joint disease, vitamin D deficiency, osteoporosis, GERD, HLD, HTN, varicose veins, and chronic pain *** presenting today for follow up.   Dysphagia:  GERD:  PLAN   ***     Venetia Night, MSN, FNP-BC, AGACNP-BC [facility]

## 2022-07-20 ENCOUNTER — Inpatient Hospital Stay: Payer: Medicare Other | Admitting: Gastroenterology

## 2022-08-22 ENCOUNTER — Ambulatory Visit (INDEPENDENT_AMBULATORY_CARE_PROVIDER_SITE_OTHER): Payer: Medicare Other

## 2022-08-22 DIAGNOSIS — I495 Sick sinus syndrome: Secondary | ICD-10-CM

## 2022-08-22 LAB — CUP PACEART REMOTE DEVICE CHECK
Battery Remaining Longevity: 141 mo
Battery Voltage: 3.03 V
Brady Statistic AP VP Percent: 3.93 %
Brady Statistic AP VS Percent: 15.35 %
Brady Statistic AS VP Percent: 4.08 %
Brady Statistic AS VS Percent: 76.64 %
Brady Statistic RA Percent Paced: 18.85 %
Brady Statistic RV Percent Paced: 8.01 %
Date Time Interrogation Session: 20231114050417
Implantable Lead Connection Status: 753985
Implantable Lead Connection Status: 753985
Implantable Lead Implant Date: 20200814
Implantable Lead Implant Date: 20200814
Implantable Lead Location: 753859
Implantable Lead Location: 753860
Implantable Lead Model: 5076
Implantable Lead Model: 5076
Implantable Pulse Generator Implant Date: 20200814
Lead Channel Impedance Value: 304 Ohm
Lead Channel Impedance Value: 361 Ohm
Lead Channel Impedance Value: 361 Ohm
Lead Channel Impedance Value: 418 Ohm
Lead Channel Pacing Threshold Amplitude: 0.625 V
Lead Channel Pacing Threshold Amplitude: 0.75 V
Lead Channel Pacing Threshold Pulse Width: 0.4 ms
Lead Channel Pacing Threshold Pulse Width: 0.4 ms
Lead Channel Sensing Intrinsic Amplitude: 1 mV
Lead Channel Sensing Intrinsic Amplitude: 1 mV
Lead Channel Sensing Intrinsic Amplitude: 19 mV
Lead Channel Sensing Intrinsic Amplitude: 19 mV
Lead Channel Setting Pacing Amplitude: 1.5 V
Lead Channel Setting Pacing Amplitude: 2.5 V
Lead Channel Setting Pacing Pulse Width: 0.4 ms
Lead Channel Setting Sensing Sensitivity: 2 mV
Zone Setting Status: 755011
Zone Setting Status: 755011

## 2022-09-20 NOTE — Progress Notes (Signed)
Remote pacemaker transmission.   

## 2022-11-08 ENCOUNTER — Encounter: Payer: Medicare Other | Admitting: Physician Assistant

## 2022-11-21 ENCOUNTER — Ambulatory Visit: Payer: Medicare Other | Attending: Physician Assistant | Admitting: Internal Medicine

## 2022-11-21 ENCOUNTER — Ambulatory Visit: Payer: Medicare Other

## 2022-11-21 ENCOUNTER — Encounter: Payer: Self-pay | Admitting: Internal Medicine

## 2022-11-21 VITALS — BP 122/80 | HR 85 | Ht 63.0 in | Wt 147.2 lb

## 2022-11-21 DIAGNOSIS — I495 Sick sinus syndrome: Secondary | ICD-10-CM | POA: Insufficient documentation

## 2022-11-21 LAB — CUP PACEART INCLINIC DEVICE CHECK
Battery Remaining Longevity: 137 mo
Battery Voltage: 3.03 V
Brady Statistic AP VP Percent: 1.28 %
Brady Statistic AP VS Percent: 18.22 %
Brady Statistic AS VP Percent: 1.4 %
Brady Statistic AS VS Percent: 79.1 %
Brady Statistic RA Percent Paced: 19.11 %
Brady Statistic RV Percent Paced: 2.68 %
Date Time Interrogation Session: 20240213095409
Implantable Lead Connection Status: 753985
Implantable Lead Connection Status: 753985
Implantable Lead Implant Date: 20200814
Implantable Lead Implant Date: 20200814
Implantable Lead Location: 753859
Implantable Lead Location: 753860
Implantable Lead Model: 5076
Implantable Lead Model: 5076
Implantable Pulse Generator Implant Date: 20200814
Lead Channel Impedance Value: 285 Ohm
Lead Channel Impedance Value: 361 Ohm
Lead Channel Impedance Value: 361 Ohm
Lead Channel Impedance Value: 418 Ohm
Lead Channel Pacing Threshold Amplitude: 0.5 V
Lead Channel Pacing Threshold Amplitude: 0.75 V
Lead Channel Pacing Threshold Pulse Width: 0.4 ms
Lead Channel Pacing Threshold Pulse Width: 0.4 ms
Lead Channel Sensing Intrinsic Amplitude: 0.625 mV
Lead Channel Sensing Intrinsic Amplitude: 1.125 mV
Lead Channel Sensing Intrinsic Amplitude: 16.375 mV
Lead Channel Sensing Intrinsic Amplitude: 19.375 mV
Lead Channel Setting Pacing Amplitude: 2 V
Lead Channel Setting Pacing Amplitude: 2.5 V
Lead Channel Setting Pacing Pulse Width: 0.4 ms
Lead Channel Setting Sensing Sensitivity: 2 mV
Zone Setting Status: 755011
Zone Setting Status: 755011

## 2022-11-21 LAB — CUP PACEART REMOTE DEVICE CHECK
Battery Remaining Longevity: 138 mo
Battery Voltage: 3.03 V
Brady Statistic AP VP Percent: 0.08 %
Brady Statistic AP VS Percent: 16.28 %
Brady Statistic AS VP Percent: 0.09 %
Brady Statistic AS VS Percent: 83.55 %
Brady Statistic RA Percent Paced: 15.98 %
Brady Statistic RV Percent Paced: 0.16 %
Date Time Interrogation Session: 20240212232916
Implantable Lead Connection Status: 753985
Implantable Lead Connection Status: 753985
Implantable Lead Implant Date: 20200814
Implantable Lead Implant Date: 20200814
Implantable Lead Location: 753859
Implantable Lead Location: 753860
Implantable Lead Model: 5076
Implantable Lead Model: 5076
Implantable Pulse Generator Implant Date: 20200814
Lead Channel Impedance Value: 285 Ohm
Lead Channel Impedance Value: 342 Ohm
Lead Channel Impedance Value: 361 Ohm
Lead Channel Impedance Value: 399 Ohm
Lead Channel Pacing Threshold Amplitude: 0.5 V
Lead Channel Pacing Threshold Amplitude: 0.75 V
Lead Channel Pacing Threshold Pulse Width: 0.4 ms
Lead Channel Pacing Threshold Pulse Width: 0.4 ms
Lead Channel Sensing Intrinsic Amplitude: 0.375 mV
Lead Channel Sensing Intrinsic Amplitude: 0.375 mV
Lead Channel Sensing Intrinsic Amplitude: 15.375 mV
Lead Channel Sensing Intrinsic Amplitude: 15.375 mV
Lead Channel Setting Pacing Amplitude: 1.5 V
Lead Channel Setting Pacing Amplitude: 2.5 V
Lead Channel Setting Pacing Pulse Width: 0.4 ms
Lead Channel Setting Sensing Sensitivity: 2 mV
Zone Setting Status: 755011
Zone Setting Status: 755011

## 2022-11-21 NOTE — Progress Notes (Signed)
HPI Denise Espinoza returns today for followup of her PPM. She is a pleasant 89 yo woman with sinus node dysfunction, HTN and orthostasis. When I saw her last she noted that her dizzy spells and improved after her PPM insertion. She now denies dizziness. No chest pain or sob. No syncope. She had cut back on her dose of metoprolol. She has been concerned about identity theft. Allergies  Allergen Reactions   Penicillins Hives, Swelling, Rash and Other (See Comments)    Did it involve swelling of the face/tongue/throat, SOB, or low BP? #  #  #  YES  #  #  #  Did it involve sudden or severe rash/hives, skin peeling, or any reaction on the inside of your mouth or nose? No Did you need to seek medical attention at a hospital or doctor's office? #  #  #  YES  #  #  #  When did it last happen?    89 years old.    Codeine Phosphate [Codeine] Other (See Comments)    headache     Current Outpatient Medications  Medication Sig Dispense Refill   acetaminophen (TYLENOL) 325 MG tablet Take 1-2 tablets (325-650 mg total) by mouth every 4 (four) hours as needed for mild pain.     Dextran 70-Hypromellose (ARTIFICIAL TEARS) 0.1-0.3 % SOLN Place 1 drop into both eyes 3 (three) times daily as needed.     donepezil (ARICEPT) 5 MG tablet Take 1 tablet every day by oral route.     pantoprazole (PROTONIX) 40 MG tablet Take 1 tablet (40 mg total) by mouth daily. 90 tablet 3   rosuvastatin (CRESTOR) 5 MG tablet Take 1 tablet (5 mg total) by mouth daily. 30 tablet 0   BELSOMRA 10 MG TABS Take 1 tablet by mouth at bedtime. (Patient not taking: Reported on 11/21/2022)     metoprolol tartrate (LOPRESSOR) 25 MG tablet Take 0.5 tablets (12.5 mg total) by mouth 2 (two) times daily. (Patient not taking: Reported on 11/21/2022) 90 tablet 3   tolterodine (DETROL) 1 MG tablet Take 1 mg by mouth 2 (two) times daily. (Patient not taking: Reported on 11/21/2022)     traMADol (ULTRAM) 50 MG tablet Take 50 mg by mouth 3 (three)  times daily as needed. (Patient not taking: Reported on 11/21/2022)     No current facility-administered medications for this visit.     Past Medical History:  Diagnosis Date   Allergic rhinitis    Anosmia    Arthritis    Full dentures    History of diverticulitis    Insomnia    Seizure ([place]) 2020   Senile osteoporosis    SOB (shortness of breath)    SVT (supraventricular tachycardia)    Varicose vein of leg    left   Wears glasses     ROS:   All systems reviewed and negative except as noted in the HPI.   Past Surgical History:  Procedure Laterality Date   COLON RESECTION  1989   diverticulitis   COLONOSCOPY     ESOPHAGEAL DILATION  2015   endoscopy   PACEMAKER IMPLANT N/A 05/23/2019   Procedure: PACEMAKER IMPLANT;  Surgeon: Constance Haw, MD;  Location: [facility];  Service: Cardiovascular;  Laterality: N/A;   POSTERIOR CERVICAL FUSION/FORAMINOTOMY N/A 02/11/2019   Procedure: POSTERIOR CERVICAL INSTRUMENTATION REMOVAL CERVICAL TWO;  Surgeon: Consuella Lose, MD;  Location: [facility];  Service: Neurosurgery;  Laterality: N/A;   POSTERIOR  CERVICAL LAMINECTOMY N/A 02/06/2019   Procedure: Cervical one LAMINECTOMY, Occiput-Cervical four fusion;  Surgeon: Consuella Lose, MD;  Location: [facility];  Service: Neurosurgery;  Laterality: N/A;     Family History  Problem Relation Age of Onset   Cancer Mother    High blood pressure Father    Emphysema Brother      Social History   Socioeconomic History   Marital status: Widowed    Spouse name: Not on file   Number of children: 2   Years of education: 91   Highest education level: Not on file  Occupational History   Occupation: Retired  Tobacco Use   Smoking status: Never   Smokeless tobacco: Never  Vaping Use   Vaping Use: Never used  Substance and Sexual Activity   Alcohol use: Yes    Comment: rare   Drug use: No   Sexual activity: Not Currently  Other Topics Concern   Not on file  Social  History Narrative   Lives w/grand dgtr, son   Caffeine- coffee 1 cup daily   Social Determinants of Health   Financial Resource Strain: Not on file  Food Insecurity: Not on file  Transportation Needs: Not on file  Physical Activity: Not on file  Stress: Not on file  Social Connections: Not on file  Intimate Partner Violence: Not on file     BP 122/80   Pulse 85   Ht 5' 3"$  (1.6 m)   Wt 147 lb 3.2 oz (66.8 kg)   SpO2 95%   BMI 26.08 kg/m   Physical Exam:  Well appearing NAD HEENT: Unremarkable Neck:  No JVD, no thyromegally Lymphatics:  No adenopathy Back:  No CVA tenderness Lungs:  Clear with no wheezes HEART:  Regular rate rhythm, no murmurs, no rubs, no clicks Abd:  soft, positive bowel sounds, no organomegally, no rebound, no guarding Ext:  2 plus pulses, no edema, no cyanosis, no clubbing Skin:  No rashes no nodules Neuro:  CN II through XII intact, motor grossly intact  ECG - NSR  DEVICE  Normal device function.  See PaceArt for details.   Assess/Plan: 1. Sinus node dysfunction - she is asymptomatic, s/p PPM insertion.  2. PPM - her Medtronic DDD PM is working normally and we will recheck in several months 3. Orthostasis - she is off of her florinef and she has no symptoms. 4. HTN - her bp is not well controlled. No change in meds.    Denise Overlie Riyanshi Wahab,MD

## 2022-11-21 NOTE — Patient Instructions (Signed)
Medication Instructions:  Your physician recommends that you continue on your current medications as directed. Please refer to the Current Medication list given to you today.  *If you need a refill on your cardiac medications before your next appointment, please call your pharmacy*   Lab Work: NONE   If you have labs (blood work) drawn today and your tests are completely normal, you will receive your results only by: [facility] (if you have MyChart) OR A paper copy in the mail If you have any lab test that is abnormal or we need to change your treatment, we will call you to review the results.   Testing/Procedures: NONE    Follow-Up: At [facility], you and your health needs are our priority.  As part of our continuing mission to provide you with exceptional heart care, we have created designated Provider Care Teams.  These Care Teams include your primary Cardiologist (physician) and Advanced Practice Providers (APPs -  Physician Assistants and Nurse Practitioners) who all work together to provide you with the care you need, when you need it.  We recommend signing up for the patient portal called "MyChart".  Sign up information is provided on this After Visit Summary.  MyChart is used to connect with patients for Virtual Visits (Telemedicine).  Patients are able to view lab/test results, encounter notes, upcoming appointments, etc.  Non-urgent messages can be sent to your provider as well.   To learn more about what you can do with MyChart, go to [email or web address].    Your next appointment:   1 year(s)  Provider:   Cristopher Peru, MD    Other Instructions Thank you for choosing [facility]!

## 2022-12-25 NOTE — Progress Notes (Signed)
Remote pacemaker transmission.   

## 2023-02-20 ENCOUNTER — Ambulatory Visit (INDEPENDENT_AMBULATORY_CARE_PROVIDER_SITE_OTHER): Payer: Medicare Other

## 2023-02-20 DIAGNOSIS — I495 Sick sinus syndrome: Secondary | ICD-10-CM

## 2023-02-21 LAB — CUP PACEART REMOTE DEVICE CHECK
Battery Remaining Longevity: 134 mo
Battery Voltage: 3.02 V
Brady Statistic AP VP Percent: 0.22 %
Brady Statistic AP VS Percent: 36.51 %
Brady Statistic AS VP Percent: 0.05 %
Brady Statistic AS VS Percent: 63.21 %
Brady Statistic RA Percent Paced: 36.23 %
Brady Statistic RV Percent Paced: 0.28 %
Date Time Interrogation Session: 20240513203359
Implantable Lead Connection Status: 753985
Implantable Lead Connection Status: 753985
Implantable Lead Implant Date: 20200814
Implantable Lead Implant Date: 20200814
Implantable Lead Location: 753859
Implantable Lead Location: 753860
Implantable Lead Model: 5076
Implantable Lead Model: 5076
Implantable Pulse Generator Implant Date: 20200814
Lead Channel Impedance Value: 285 Ohm
Lead Channel Impedance Value: 380 Ohm
Lead Channel Impedance Value: 399 Ohm
Lead Channel Impedance Value: 437 Ohm
Lead Channel Pacing Threshold Amplitude: 0.5 V
Lead Channel Pacing Threshold Amplitude: 0.75 V
Lead Channel Pacing Threshold Pulse Width: 0.4 ms
Lead Channel Pacing Threshold Pulse Width: 0.4 ms
Lead Channel Sensing Intrinsic Amplitude: 0.875 mV
Lead Channel Sensing Intrinsic Amplitude: 0.875 mV
Lead Channel Sensing Intrinsic Amplitude: 16.375 mV
Lead Channel Sensing Intrinsic Amplitude: 16.375 mV
Lead Channel Setting Pacing Amplitude: 2 V
Lead Channel Setting Pacing Amplitude: 2.5 V
Lead Channel Setting Pacing Pulse Width: 0.4 ms
Lead Channel Setting Sensing Sensitivity: 2 mV
Zone Setting Status: 755011
Zone Setting Status: 755011

## 2023-03-20 NOTE — Progress Notes (Signed)
Remote pacemaker transmission.   

## 2023-04-06 ENCOUNTER — Telehealth: Payer: Self-pay | Admitting: Internal Medicine

## 2023-04-06 NOTE — Telephone Encounter (Signed)
  Pt c/o swelling: STAT is pt has developed SOB within 24 hours  If swelling, where is the swelling located? Both legs  How much weight have you gained and in what time span? 10 lbs 4 months   Have you gained 3 pounds in a day or 5 pounds in a week?   Do you have a log of your daily weights (if so, list)?   Are you currently taking a fluid pill? Yes   Are you currently SOB? Some   Have you traveled recently? No    The patient mentioned that she consulted her primary care physician, who advised her to contact Dr. Ladona Ridgel regarding her leg swelling. She also noted a weight gain of 10 pounds over the past four months and experiences shortness of breath during light activity.

## 2023-05-22 ENCOUNTER — Ambulatory Visit (INDEPENDENT_AMBULATORY_CARE_PROVIDER_SITE_OTHER): Payer: Medicare Other

## 2023-05-22 DIAGNOSIS — I495 Sick sinus syndrome: Secondary | ICD-10-CM | POA: Diagnosis not present

## 2023-05-22 LAB — CUP PACEART REMOTE DEVICE CHECK
Battery Remaining Longevity: 130 mo
Battery Voltage: 3.02 V
Brady Statistic AP VP Percent: 0.05 %
Brady Statistic AP VS Percent: 60.98 %
Brady Statistic AS VP Percent: 0.01 %
Brady Statistic AS VS Percent: 38.96 %
Brady Statistic RA Percent Paced: 60.27 %
Brady Statistic RV Percent Paced: 0.06 %
Date Time Interrogation Session: 20240813031741
Implantable Lead Connection Status: 753985
Implantable Lead Connection Status: 753985
Implantable Lead Implant Date: 20200814
Implantable Lead Implant Date: 20200814
Implantable Lead Location: 753859
Implantable Lead Location: 753860
Implantable Lead Model: 5076
Implantable Lead Model: 5076
Implantable Pulse Generator Implant Date: 20200814
Lead Channel Impedance Value: 285 Ohm
Lead Channel Impedance Value: 437 Ohm
Lead Channel Impedance Value: 437 Ohm
Lead Channel Impedance Value: 475 Ohm
Lead Channel Pacing Threshold Amplitude: 0.5 V
Lead Channel Pacing Threshold Amplitude: 0.75 V
Lead Channel Pacing Threshold Pulse Width: 0.4 ms
Lead Channel Pacing Threshold Pulse Width: 0.4 ms
Lead Channel Sensing Intrinsic Amplitude: 1 mV
Lead Channel Sensing Intrinsic Amplitude: 1 mV
Lead Channel Sensing Intrinsic Amplitude: 21.625 mV
Lead Channel Sensing Intrinsic Amplitude: 21.625 mV
Lead Channel Setting Pacing Amplitude: 2 V
Lead Channel Setting Pacing Amplitude: 2.5 V
Lead Channel Setting Pacing Pulse Width: 0.4 ms
Lead Channel Setting Sensing Sensitivity: 2 mV
Zone Setting Status: 755011
Zone Setting Status: 755011

## 2023-06-06 NOTE — Progress Notes (Signed)
Remote pacemaker transmission.   

## 2023-07-26 ENCOUNTER — Other Ambulatory Visit: Payer: Self-pay | Admitting: *Deleted

## 2023-07-26 ENCOUNTER — Ambulatory Visit: Payer: Medicare Other | Admitting: Gastroenterology

## 2023-07-26 ENCOUNTER — Encounter: Payer: Self-pay | Admitting: Gastroenterology

## 2023-07-26 VITALS — BP 137/69 | HR 78 | Temp 97.8°F | Ht 63.0 in | Wt 148.8 lb

## 2023-07-26 DIAGNOSIS — K5900 Constipation, unspecified: Secondary | ICD-10-CM | POA: Diagnosis not present

## 2023-07-26 DIAGNOSIS — K219 Gastro-esophageal reflux disease without esophagitis: Secondary | ICD-10-CM | POA: Diagnosis not present

## 2023-07-26 DIAGNOSIS — R143 Flatulence: Secondary | ICD-10-CM

## 2023-07-26 DIAGNOSIS — R1310 Dysphagia, unspecified: Secondary | ICD-10-CM | POA: Diagnosis not present

## 2023-07-26 DIAGNOSIS — R140 Abdominal distension (gaseous): Secondary | ICD-10-CM | POA: Diagnosis not present

## 2023-07-26 MED ORDER — SIMETHICONE 80 MG PO CHEW: 80.0000 mg | CHEWABLE_TABLET | Freq: Four times a day (QID) | ORAL | 0 refills | Status: AC | PRN

## 2023-07-26 MED ORDER — POLYETHYLENE GLYCOL 3350 17 GM/SCOOP PO POWD: ORAL | 0 refills | Status: DC

## 2023-07-26 MED ORDER — PANTOPRAZOLE SODIUM 40 MG PO TBEC: 40.0000 mg | DELAYED_RELEASE_TABLET | Freq: Every day | ORAL | 1 refills | Status: AC

## 2023-07-26 NOTE — Patient Instructions (Addendum)
For your difficulty swallowing: We will get you scheduled for a barium pill esophagram.  This is a special x-ray to see if there is any narrowing in your esophagus. Continue pantoprazole 40 mg once daily.  I am sending in a new prescription for you today. Take small bites and alternate with sips of liquids.  Please ensure you are chewing completely prior to swallowing If you are having difficulties with rough textures, continue to follow a soft diet for now.  For your constipation: Start MiraLAX 17 g once daily in 8 ounces of water or coffee. Please ensure you are getting at least 4 to 6 glasses of water daily Gas-X as needed  We will be in touch with results of your barium pill esophagram once I have received them.  Follow up in 3 weeks.   It was a pleasure to see you today. I want to create trusting relationships with patients. If you receive a survey regarding your visit,  I greatly appreciate you taking time to fill this out on paper or through your MyChart. I value your feedback.  Brooke Bonito, MSN, FNP-BC, AGACNP-BC [facility]

## 2023-07-26 NOTE — Progress Notes (Signed)
GI Office Note    Referring Provider: Tacy Learn, FNP Primary Care Physician:  Tacy Learn, FNP Primary Gastroenterologist: Gerrit Friends.Rourk, MD  Date:  07/26/2023  ID:  Denise Espinoza, DOB May 14, 1930, MRN [id number]   Chief Complaint   Chief Complaint  Patient presents with   Dysgraphia    Having problems swallowing. Stomach is hurting and constipation.    History of Present Illness  Denise Espinoza is a 89 y.o. female with a history of insomnia, paroxysmal atrial tachycardia, sick sinus syndrome s/p pacemaker in August 2020, degenerative joint disease, vitamin D deficiency, osteoporosis, prediabetes, GERD, HLD, HTN, varicose veins, chronic pain presenting today for follow-up of dysphagia.  Per review of Care Everywhere it appears she was seen on 05/20/21 by [facility] in [place] for slow transit constipation, esophageal dysphagia, GERD without esophagitis.  Unable to review any notes from this visit.  Epic history states colonoscopy has been performed but unsure timing. Previous history of colon resection in 1989 due to diverticulitis. Reportedly had esophageal dilation in 2015 with no report available.    Per referral documentation from PCP with visit note dating 03/09/2022: Last colonoscopy 02/25/2022 with Dr. Virgilio Belling noting normal colonoscopy in no more recommended due to age.  She stated complaint of feeling like food was getting stuck in her throat with swallowing and coughs when she eats frequently. Currently taking pantoprazole 40 mg once daily and over-the-counter Tums occasionally to relieve her heartburn.  Patient had pacemaker placed in August 2020 due to bradycardia and syncope.  Follows with cardiology.  Reported to PCP that she was having dizzy spells at night when she lays down.  Last visit 04/13/22. Reported solid food dysphagia for about 4 months.  Reports she was chewing food adequately but not able to swallow as well.  Feels  like it stuck in the base of her neck.  She reported no history of upper endoscopy.  In regards to her reflux she denied any nausea vomiting or abdominal pain.  She was reporting some throat burning and chest burning.  Also has reported some intermittent constipation and diarrhea with minor incontinence/fecal seepage.  She did report some straining with bowel movements at times.  It has BPE, continue pantoprazole 40 mg once daily and recommended calcium and vitamin D supplementation.  Advised Benefiber 2 teaspoons daily.  BPE not performed for unknown reason  Today:  Able to eat yogurt and cream potatoes but meat, salads, etc are hard to get down. Sometimes her medications fill like they get stuck at the base of her neck. Having nausea at times and sometimes does regurgitate it back up and it feels thick. Once in a while she has some chest discomfort and burning.   Still having some incontinence with urine. Sometimes she has smears of stool in her underwear. Does not know she has had it at times. For the last few times she has hard balls and has to strain. Not taking any fiber supplements or miralax. Uses an otc gentle laxative (1/2 tablet) about once per week. That does help. She thinks her stool is black but her aide at home says its not. She thinks her stool is a little tarry. She is unsure if she is taking pantoprazole.   She reports lots of gassiness as well. Has been using pepto bismol for that frequently. Used to chew tums occasionally.   She does report some memory issues.   Has lots of joint pain.    Current Outpatient Medications  Medication Sig Dispense Refill   acetaminophen (TYLENOL) 325 MG tablet Take 1-2 tablets (325-650 mg total) by mouth every 4 (four) hours as needed for mild pain.     Dextran 70-Hypromellose (ARTIFICIAL TEARS) 0.1-0.3 % SOLN Place 1 drop into both eyes 3 (three) times daily as needed.     donepezil (ARICEPT) 5 MG tablet Take 1 tablet every day by oral route.      oxybutynin (DITROPAN-XL) 10 MG 24 hr tablet Take 10 mg by mouth daily as needed.     pantoprazole (PROTONIX) 40 MG tablet Take 1 tablet (40 mg total) by mouth daily. 90 tablet 3   rosuvastatin (CRESTOR) 5 MG tablet Take 1 tablet (5 mg total) by mouth daily. 30 tablet 0   sertraline (ZOLOFT) 25 MG tablet Take 25 mg by mouth daily.     traZODone (DESYREL) 150 MG tablet Take 150 mg by mouth at bedtime.     BELSOMRA 10 MG TABS Take 1 tablet by mouth at bedtime. (Patient not taking: Reported on 11/21/2022)     metoprolol tartrate (LOPRESSOR) 25 MG tablet Take 0.5 tablets (12.5 mg total) by mouth 2 (two) times daily. (Patient not taking: Reported on 11/21/2022) 90 tablet 3   tolterodine (DETROL) 1 MG tablet Take 1 mg by mouth 2 (two) times daily. (Patient not taking: Reported on 11/21/2022)     traMADol (ULTRAM) 50 MG tablet Take 50 mg by mouth 3 (three) times daily as needed. (Patient not taking: Reported on 11/21/2022)     No current facility-administered medications for this visit.    Past Medical History:  Diagnosis Date   Allergic rhinitis    Anosmia    Arthritis    Full dentures    History of diverticulitis    Insomnia    Seizure ([facility]) 2020   Senile osteoporosis    SOB (shortness of breath)    SVT (supraventricular tachycardia) ([facility])    Varicose vein of leg    left   Wears glasses     Past Surgical History:  Procedure Laterality Date   COLON RESECTION  1989   diverticulitis   COLONOSCOPY     ESOPHAGEAL DILATION  2015   endoscopy   PACEMAKER IMPLANT N/A 05/23/2019   Procedure: PACEMAKER IMPLANT;  Surgeon: Regan Lemming, MD;  Location: [facility];  Service: Cardiovascular;  Laterality: N/A;   POSTERIOR CERVICAL FUSION/FORAMINOTOMY N/A 02/11/2019   Procedure: POSTERIOR CERVICAL INSTRUMENTATION REMOVAL CERVICAL TWO;  Surgeon: Lisbeth Renshaw, MD;  Location: [facility];  Service: Neurosurgery;  Laterality: N/A;   POSTERIOR CERVICAL LAMINECTOMY N/A 02/06/2019   Procedure:  Cervical one LAMINECTOMY, Occiput-Cervical four fusion;  Surgeon: Lisbeth Renshaw, MD;  Location: [facility];  Service: Neurosurgery;  Laterality: N/A;    Family History  Problem Relation Age of Onset   Cancer Mother    High blood pressure Father    Emphysema Brother     Allergies as of 07/26/2023 - Review Complete 07/26/2023  Allergen Reaction Noted   Penicillins Hives, Swelling, Rash, and Other (See Comments) 02/12/2017   Codeine phosphate [codeine] Other (See Comments) 02/12/2017    Social History   Socioeconomic History   Marital status: Widowed    Spouse name: Not on file   Number of children: 2   Years of education: 37   Highest education level: Not on file  Occupational History   Occupation: Retired  Tobacco Use   Smoking status: Never   Smokeless tobacco: Never  Vaping Use   Vaping  status: Never Used  Substance and Sexual Activity   Alcohol use: Yes    Comment: rare   Drug use: No   Sexual activity: Not Currently  Other Topics Concern   Not on file  Social History Narrative   Lives w/grand dgtr, son   Caffeine- coffee 1 cup daily   Social Determinants of Health   Financial Resource Strain: Not on file  Food Insecurity: Not on file  Transportation Needs: Not on file  Physical Activity: Not on file  Stress: Not on file  Social Connections: Not on file     Review of Systems   Gen: Denies fever, chills, anorexia. Denies fatigue, weakness, weight loss.  CV: Denies chest pain, palpitations, syncope, peripheral edema, and claudication. Resp: Denies dyspnea at rest, cough, wheezing, coughing up blood, and pleurisy. GI: See HPI Derm: Denies rash, itching, dry skin Psych: + intermittent memory loss, confusion. Denies depression, anxiety. No homicidal or suicidal ideation.  Heme: Denies bruising, bleeding, and enlarged lymph nodes.   Physical Exam   BP 137/69 (BP Location: Right Arm, Patient Position: Sitting, Cuff Size: Normal)   Pulse 78   Temp 97.8 F  (36.6 C) (Temporal)   Ht 5\' 3"  (1.6 m)   Wt 148 lb 12.8 oz (67.5 kg)   SpO2 95%   BMI 26.36 kg/m   General:   Alert and oriented. No distress noted. Pleasant and cooperative.  Head:  Normocephalic and atraumatic. Eyes:  Conjuctiva clear without scleral icterus. Mouth:  Oral mucosa pink and moist. Good dentition. No lesions. Lungs:  Clear to auscultation bilaterally. No wheezes, rales, or rhonchi. No distress.  Heart:  S1, S2 present without murmurs appreciated.  Abdomen:  +BS, soft, non-tender and non-distended. No rebound or guarding. No HSM or masses noted. Rectal: deferred Msk:  Symmetrical without gross deformities. Normal posture. Extremities:  Without edema. Neurologic:  Alert and  oriented x4 Psych:  Alert and cooperative. Normal mood and affect.   Assessment  Denise Espinoza is a 89 y.o. female with a history of insomnia, paroxysmal atrial tachycardia, sick sinus syndrome s/p pacemaker in August 2020, degenerative joint disease, vitamin D deficiency, osteoporosis, prediabetes, GERD, HLD, HTN, varicose veins, chronic pain presenting today for follow-up of dysphagia.   Constipation: History somewhat difficult to ascertain however appears patient has been experiencing constipation given hard stools, primarily [place] 1 in nature.  Also experiencing lots of gas and some mild bloating.  Recommended MiraLAX daily to help with constipation and Gas-X as needed to help with gas and bloating.  Suspect her gassiness and bloating is secondary to her constipation.  Plan to follow-up in 3 months, sooner if needed.  Dysphagia, GERD: Continuing to have issues with dysphagia with primarily solids, sometimes medications as well.  Having to follow soft diet, has issues with meats as well as salad.  Previously recommended barium pill esophagram which was never performed.  Will plan to do this again to evaluate for any stricturing or stenosis.  Dysphagia precautions discussed.  Advised to follow GERD  diet.  Unclear if she is taking PPI.  Advised to take this once daily going forward and sent new prescription today.  PLAN   Continue pantoprazole 40 mg once daily.  Dysphagia precautions discussed. Barium pill esophagram.  GERD diet MiraLAX 17 g daily Gas ex as needed Follow up in 3 months    Brooke Bonito, MSN, FNP-BC, AGACNP-BC [facility]

## 2023-08-01 ENCOUNTER — Ambulatory Visit (HOSPITAL_COMMUNITY)
Admission: RE | Admit: 2023-08-01 | Discharge: 2023-08-01 | Disposition: A | Discharge status: Home / Self Care | Payer: Medicare Other | Source: Ambulatory Visit | Attending: Gastroenterology | Admitting: Gastroenterology

## 2023-08-01 ENCOUNTER — Other Ambulatory Visit: Payer: Self-pay | Admitting: Gastroenterology

## 2023-08-01 DIAGNOSIS — R1310 Dysphagia, unspecified: Secondary | ICD-10-CM

## 2023-08-10 ENCOUNTER — Encounter: Payer: Self-pay | Admitting: *Deleted

## 2023-08-21 ENCOUNTER — Ambulatory Visit (INDEPENDENT_AMBULATORY_CARE_PROVIDER_SITE_OTHER): Payer: Medicare Other

## 2023-08-21 DIAGNOSIS — I495 Sick sinus syndrome: Secondary | ICD-10-CM

## 2023-08-23 LAB — CUP PACEART REMOTE DEVICE CHECK
Battery Remaining Longevity: 127 mo
Battery Voltage: 3.02 V
Brady Statistic AP VP Percent: 0.57 %
Brady Statistic AP VS Percent: 47.96 %
Brady Statistic AS VP Percent: 0.11 %
Brady Statistic AS VS Percent: 51.36 %
Brady Statistic RA Percent Paced: 48.06 %
Brady Statistic RV Percent Paced: 0.67 %
Date Time Interrogation Session: 20241111224900
Implantable Lead Connection Status: 753985
Implantable Lead Connection Status: 753985
Implantable Lead Implant Date: 20200814
Implantable Lead Implant Date: 20200814
Implantable Lead Location: 753859
Implantable Lead Location: 753860
Implantable Lead Model: 5076
Implantable Lead Model: 5076
Implantable Pulse Generator Implant Date: 20200814
Lead Channel Impedance Value: 304 Ohm
Lead Channel Impedance Value: 418 Ohm
Lead Channel Impedance Value: 418 Ohm
Lead Channel Impedance Value: 475 Ohm
Lead Channel Pacing Threshold Amplitude: 0.5 V
Lead Channel Pacing Threshold Amplitude: 0.75 V
Lead Channel Pacing Threshold Pulse Width: 0.4 ms
Lead Channel Pacing Threshold Pulse Width: 0.4 ms
Lead Channel Sensing Intrinsic Amplitude: 0.875 mV
Lead Channel Sensing Intrinsic Amplitude: 0.875 mV
Lead Channel Sensing Intrinsic Amplitude: 18.25 mV
Lead Channel Sensing Intrinsic Amplitude: 18.25 mV
Lead Channel Setting Pacing Amplitude: 2 V
Lead Channel Setting Pacing Amplitude: 2.5 V
Lead Channel Setting Pacing Pulse Width: 0.4 ms
Lead Channel Setting Sensing Sensitivity: 2 mV
Zone Setting Status: 755011
Zone Setting Status: 755011

## 2023-09-18 NOTE — Progress Notes (Signed)
Remote pacemaker transmission.   

## 2023-09-25 ENCOUNTER — Encounter: Payer: Self-pay | Admitting: Gastroenterology

## 2023-09-27 ENCOUNTER — Other Ambulatory Visit: Payer: Self-pay | Admitting: Internal Medicine

## 2023-11-20 ENCOUNTER — Inpatient Hospital Stay (HOSPITAL_COMMUNITY)
Admission: EM | Admit: 2023-11-20 | Discharge: 2023-11-23 | DRG: 177 | Disposition: A | Discharge status: Skilled Nursing Facility | Payer: Medicare HMO | Attending: Family Medicine | Admitting: Family Medicine

## 2023-11-20 ENCOUNTER — Encounter (HOSPITAL_COMMUNITY): Payer: Self-pay

## 2023-11-20 ENCOUNTER — Ambulatory Visit (INDEPENDENT_AMBULATORY_CARE_PROVIDER_SITE_OTHER): Payer: Medicare Other

## 2023-11-20 ENCOUNTER — Emergency Department: Payer: Medicare HMO

## 2023-11-20 ENCOUNTER — Other Ambulatory Visit: Payer: Self-pay

## 2023-11-20 ENCOUNTER — Emergency Department (HOSPITAL_COMMUNITY): Payer: Medicare HMO

## 2023-11-20 DIAGNOSIS — U071 COVID-19: Principal | ICD-10-CM | POA: Diagnosis present

## 2023-11-20 DIAGNOSIS — Z79899 Other long term (current) drug therapy: Secondary | ICD-10-CM

## 2023-11-20 DIAGNOSIS — R059 Cough, unspecified: Secondary | ICD-10-CM | POA: Diagnosis not present

## 2023-11-20 DIAGNOSIS — I493 Ventricular premature depolarization: Secondary | ICD-10-CM | POA: Diagnosis present

## 2023-11-20 DIAGNOSIS — Z880 Allergy status to penicillin: Secondary | ICD-10-CM

## 2023-11-20 DIAGNOSIS — Z950 Presence of cardiac pacemaker: Secondary | ICD-10-CM

## 2023-11-20 DIAGNOSIS — I495 Sick sinus syndrome: Secondary | ICD-10-CM | POA: Diagnosis not present

## 2023-11-20 DIAGNOSIS — M4801 Spinal stenosis, occipito-atlanto-axial region: Secondary | ICD-10-CM | POA: Diagnosis present

## 2023-11-20 DIAGNOSIS — I10 Essential (primary) hypertension: Secondary | ICD-10-CM | POA: Diagnosis not present

## 2023-11-20 DIAGNOSIS — N390 Urinary tract infection, site not specified: Secondary | ICD-10-CM | POA: Diagnosis present

## 2023-11-20 DIAGNOSIS — Z885 Allergy status to narcotic agent status: Secondary | ICD-10-CM

## 2023-11-20 DIAGNOSIS — I110 Hypertensive heart disease with heart failure: Secondary | ICD-10-CM | POA: Diagnosis present

## 2023-11-20 DIAGNOSIS — M1990 Unspecified osteoarthritis, unspecified site: Secondary | ICD-10-CM | POA: Diagnosis present

## 2023-11-20 DIAGNOSIS — E782 Mixed hyperlipidemia: Secondary | ICD-10-CM | POA: Diagnosis present

## 2023-11-20 DIAGNOSIS — I2699 Other pulmonary embolism without acute cor pulmonale: Secondary | ICD-10-CM | POA: Diagnosis present

## 2023-11-20 DIAGNOSIS — M810 Age-related osteoporosis without current pathological fracture: Secondary | ICD-10-CM | POA: Diagnosis present

## 2023-11-20 DIAGNOSIS — F0390 Unspecified dementia without behavioral disturbance: Secondary | ICD-10-CM | POA: Insufficient documentation

## 2023-11-20 DIAGNOSIS — I8392 Asymptomatic varicose veins of left lower extremity: Secondary | ICD-10-CM | POA: Diagnosis present

## 2023-11-20 DIAGNOSIS — D649 Anemia, unspecified: Secondary | ICD-10-CM | POA: Diagnosis present

## 2023-11-20 DIAGNOSIS — G959 Disease of spinal cord, unspecified: Secondary | ICD-10-CM | POA: Diagnosis present

## 2023-11-20 DIAGNOSIS — K219 Gastro-esophageal reflux disease without esophagitis: Secondary | ICD-10-CM | POA: Diagnosis present

## 2023-11-20 DIAGNOSIS — I2694 Multiple subsegmental pulmonary emboli without acute cor pulmonale: Principal | ICD-10-CM | POA: Diagnosis present

## 2023-11-20 DIAGNOSIS — I5032 Chronic diastolic (congestive) heart failure: Secondary | ICD-10-CM | POA: Diagnosis present

## 2023-11-20 DIAGNOSIS — F03A0 Unspecified dementia, mild, without behavioral disturbance, psychotic disturbance, mood disturbance, and anxiety: Secondary | ICD-10-CM | POA: Diagnosis present

## 2023-11-20 DIAGNOSIS — G90A Postural orthostatic tachycardia syndrome (POTS): Secondary | ICD-10-CM | POA: Diagnosis present

## 2023-11-20 DIAGNOSIS — I4891 Unspecified atrial fibrillation: Secondary | ICD-10-CM | POA: Diagnosis present

## 2023-11-20 DIAGNOSIS — Z66 Do not resuscitate: Secondary | ICD-10-CM | POA: Diagnosis present

## 2023-11-20 HISTORY — DX: Sick sinus syndrome: I49.5

## 2023-11-20 LAB — URINALYSIS, ROUTINE W REFLEX MICROSCOPIC
Bilirubin Urine: NEGATIVE
Glucose, UA: NEGATIVE mg/dL
Ketones, ur: NEGATIVE mg/dL
Leukocytes,Ua: NEGATIVE
Nitrite: NEGATIVE
Protein, ur: NEGATIVE mg/dL
Specific Gravity, Urine: 1.015 (ref 1.005–1.030)
pH: 5 (ref 5.0–8.0)

## 2023-11-20 LAB — CBC WITH DIFFERENTIAL/PLATELET
Abs Immature Granulocytes: 0.03 K/uL (ref 0.00–0.07)
Basophils Absolute: 0 K/uL (ref 0.0–0.1)
Basophils Relative: 0 %
Eosinophils Absolute: 0 K/uL (ref 0.0–0.5)
Eosinophils Relative: 0 %
HCT: 35.3 % — ABNORMAL LOW (ref 36.0–46.0)
Hemoglobin: 11.8 g/dL — ABNORMAL LOW (ref 12.0–15.0)
Immature Granulocytes: 1 %
Lymphocytes Relative: 23 %
Lymphs Abs: 1.4 K/uL (ref 0.7–4.0)
MCH: 31.2 pg (ref 26.0–34.0)
MCHC: 33.4 g/dL (ref 30.0–36.0)
MCV: 93.4 fL (ref 80.0–100.0)
Monocytes Absolute: 0.6 K/uL (ref 0.1–1.0)
Monocytes Relative: 9 %
Neutro Abs: 4 K/uL (ref 1.7–7.7)
Neutrophils Relative %: 67 %
Platelets: 162 K/uL (ref 150–400)
RBC: 3.78 MIL/uL — ABNORMAL LOW (ref 3.87–5.11)
RDW: 12.8 % (ref 11.5–15.5)
WBC: 6.1 K/uL (ref 4.0–10.5)
nRBC: 0 % (ref 0.0–0.2)

## 2023-11-20 LAB — BASIC METABOLIC PANEL WITH GFR
Anion gap: 8 (ref 5–15)
BUN: 19 mg/dL (ref 8–23)
CO2: 26 mmol/L (ref 22–32)
Calcium: 9.3 mg/dL (ref 8.9–10.3)
Chloride: 104 mmol/L (ref 98–111)
Creatinine, Ser: 0.83 mg/dL (ref 0.44–1.00)
GFR, Estimated: >60 mL/min
Glucose, Bld: 108 mg/dL — ABNORMAL HIGH (ref 70–99)
Potassium: 3.8 mmol/L (ref 3.5–5.1)
Sodium: 138 mmol/L (ref 135–145)

## 2023-11-20 LAB — APTT: aPTT: 30 s (ref 24–36)

## 2023-11-20 LAB — RESP PANEL BY RT-PCR (RSV, FLU A&B, COVID)  RVPGX2
Influenza A by PCR: NEGATIVE
Influenza B by PCR: NEGATIVE
Resp Syncytial Virus by PCR: NEGATIVE
SARS Coronavirus 2 by RT PCR: POSITIVE — AB

## 2023-11-20 LAB — PROTIME-INR
INR: 1.1 (ref 0.8–1.2)
Prothrombin Time: 14.8 s (ref 11.4–15.2)

## 2023-11-20 MED ORDER — ACETAMINOPHEN 325 MG PO TABS
650.0000 mg | ORAL_TABLET | Freq: Once | ORAL | Status: AC
  Administered 2023-11-20: 650 mg via ORAL
  Filled 2023-11-20: qty 2

## 2023-11-20 MED ORDER — ALUM & MAG HYDROXIDE-SIMETH 200-200-20 MG/5ML PO SUSP
30.0000 mL | Freq: Once | ORAL | Status: AC
  Administered 2023-11-20: 30 mL via ORAL
  Filled 2023-11-20: qty 30

## 2023-11-20 MED ORDER — LACTATED RINGERS IV BOLUS
500.0000 mL | Freq: Once | INTRAVENOUS | Status: AC
  Administered 2023-11-20: 500 mL via INTRAVENOUS

## 2023-11-20 MED ORDER — HEPARIN BOLUS VIA INFUSION
4600.0000 [IU] | Freq: Once | INTRAVENOUS | Status: AC
  Administered 2023-11-20: 4600 [IU] via INTRAVENOUS

## 2023-11-20 MED ORDER — HEPARIN (PORCINE) 25000 UT/250ML-% IV SOLN
1000.0000 [IU]/h | INTRAVENOUS | Status: DC
  Administered 2023-11-20: 1100 [IU]/h via INTRAVENOUS
  Administered 2023-11-21: 950 [IU]/h via INTRAVENOUS
  Filled 2023-11-20: qty 250

## 2023-11-20 MED ORDER — IOHEXOL 350 MG/ML SOLN
100.0000 mL | Freq: Once | INTRAVENOUS | Status: AC | PRN
  Administered 2023-11-20: 100 mL via INTRAVENOUS

## 2023-11-20 MED ORDER — LIDOCAINE VISCOUS HCL 2 % MT SOLN
15.0000 mL | Freq: Once | OROMUCOSAL | Status: AC
  Administered 2023-11-20: 15 mL via ORAL
  Filled 2023-11-20: qty 15

## 2023-11-20 MED ORDER — ONDANSETRON 4 MG PO TBDP
4.0000 mg | ORAL_TABLET | Freq: Once | ORAL | Status: AC
  Administered 2023-11-20: 4 mg via ORAL
  Filled 2023-11-20: qty 1

## 2023-11-20 NOTE — Progress Notes (Addendum)
PHARMACY - ANTICOAGULATION CONSULT NOTE  Pharmacy Consult for Heparin drip Indication: pulmonary embolus  Allergies  Allergen Reactions   Penicillins Hives, Swelling, Rash and Other (See Comments)    Did it involve swelling of the face/tongue/throat, SOB, or low BP? #  #  #  YES  #  #  #  Did it involve sudden or severe rash/hives, skin peeling, or any reaction on the inside of your mouth or nose? No Did you need to seek medical attention at a hospital or doctor's office? #  #  #  YES  #  #  #  When did it last happen?    89 years old.    Codeine Phosphate [Codeine] Other (See Comments)    headache    Patient Measurements: Height: 5\' 3"  (160 cm) Weight: 67.5 kg (148 lb 13 oz) IBW/kg (Calculated) : 52.4 Heparin Dosing Weight: 66 kg  Vital Signs: Temp: 98 F (36.7 C) (02/11 1650) Temp Source: Oral (02/11 1650) BP: 146/74 (02/11 1650) Pulse Rate: 50 (02/11 1650)  Labs: Recent Labs    11/20/23 1800 11/20/23 1905  HGB  --  11.8*  HCT  --  35.3*  PLT  --  162  CREATININE 0.83  --     Estimated Creatinine Clearance: 40.7 mL/min (by C-G formula based on SCr of 0.83 mg/dL).   Medical History: Past Medical History:  Diagnosis Date   Allergic rhinitis    Anosmia    Arthritis    Full dentures    History of diverticulitis    Insomnia    Seizure ([facility]) 2020   Senile osteoporosis    Sick sinus syndrome ([facility])    SOB (shortness of breath)    SVT (supraventricular tachycardia) (H[facility])    Varicose vein of leg    left   Wears glasses     Medications:  (Not in a hospital admission)  Scheduled:   heparin  4,600 Units Intravenous Once   Infusions:   heparin      Assessment: 89 yo F to start heparin drip for Pulmonary Embolism.  Recent Covid diagnosis 6 days PTA Chest CT: Bilateral lower lobe lobar and segmental pulmonary emboli. No evidence of right heart strain. No anticoagulants PTA per med rec, fill hx, chart search Hx:SVT, GERD, seizures, SVT, CHF, arthritis,  pacemaker  Baseline:  Hgb 11.8  Plt 162  INR 1.1   aPTT 30   Goal of Therapy:  Heparin level 0.3-0.7 units/ml Monitor platelets by anticoagulation protocol: Yes   Plan:  Give 4600 units bolus x 1 Start heparin infusion at 1100 units/hr Check anti-Xa level in 8 hours and daily while on heparin Continue to monitor H&H and platelets  Bari Mantis PharmD Clinical Pharmacist 11/20/2023

## 2023-11-20 NOTE — ED Provider Notes (Signed)
[place] EMERGENCY DEPARTMENT AT [facility] Provider Note   CSN: [id number] Arrival date & time: 11/20/23  1608     History {Add pertinent medical, surgical, social history, OB history to HPI:1} Chief Complaint  Patient presents with   Weakness    Denise Espinoza is a 89 y.o. female.   Weakness Associated symptoms: cough and nausea   Patient presents for generalized weakness.  Medical history includes SVT, GERD, seizures, atrial fibrillation, CHF, arthritis.  Symptoms over the past 2 weeks have included cough, nausea, poor p.o. intake, and generalized weakness.  She was seen at her doctor's office 6 days ago.  She was diagnosed with COVID-19 at the time.  Granddaughter feels that she has not gotten any better.  She has required increased assistance at home with ambulation.  She has had poor sleep due to her cough.  Patient, herself, denies any current areas of discomfort.     Home Medications Prior to Admission medications   Medication Sig Start Date End Date Taking? Authorizing Provider  acetaminophen (TYLENOL) 325 MG tablet Take 1-2 tablets (325-650 mg total) by mouth every 4 (four) hours as needed for mild pain. 03/10/19   Love, Evlyn Kanner, PA-C  Dextran 70-Hypromellose (ARTIFICIAL TEARS) 0.1-0.3 % SOLN Place 1 drop into both eyes 3 (three) times daily as needed. 03/24/19   [provider]  donepezil (ARICEPT) 5 MG tablet Take 1 tablet every day by oral route. 03/09/22   [provider]  metoprolol tartrate (LOPRESSOR) 25 MG tablet Take 1/2 (one-half) tablet by mouth twice daily 09/27/23   Marinus Maw, MD  oxybutynin (DITROPAN-XL) 10 MG 24 hr tablet Take 10 mg by mouth daily as needed. 04/05/23   [provider]  pantoprazole (PROTONIX) 40 MG tablet Take 1 tablet (40 mg total) by mouth daily. 07/26/23   Aida Raider, NP  polyethylene glycol powder (GLYCOLAX/MIRALAX) 17 GM/SCOOP powder Take 17g daily in 8 ounces of water or coffee. 07/26/23    Aida Raider, NP  rosuvastatin (CRESTOR) 5 MG tablet Take 1 tablet (5 mg total) by mouth daily. 04/17/19   Sharee Holster, NP  sertraline (ZOLOFT) 25 MG tablet Take 25 mg by mouth daily. 07/18/23   [provider]  simethicone (MYLICON) 80 MG chewable tablet Chew 1 tablet (80 mg total) by mouth every 6 (six) hours as needed for flatulence. 07/26/23   Aida Raider, NP  traZODone (DESYREL) 150 MG tablet Take 150 mg by mouth at bedtime. 07/04/23   [provider]      Allergies    Penicillins and Codeine phosphate [codeine]    Review of Systems   Review of Systems  Constitutional:  Positive for activity change, appetite change and fatigue.  Respiratory:  Positive for cough.   Gastrointestinal:  Positive for nausea.  Neurological:  Positive for weakness (Generalized).  All other systems reviewed and are negative.   Physical Exam Updated Vital Signs BP (!) 146/74 (BP Location: Right Arm)   Pulse (!) 50   Temp 98 F (36.7 C) (Oral)   Resp 17   Ht 5\' 3"  (1.6 m)   Wt 67.5 kg   SpO2 94%   BMI 26.36 kg/m  Physical Exam Vitals and nursing note reviewed.  Constitutional:      General: She is not in acute distress.    Appearance: Normal appearance. She is well-developed. She is not ill-appearing, toxic-appearing or diaphoretic.  HENT:     Head: Normocephalic and atraumatic.  Right Ear: External ear normal.     Left Ear: External ear normal.     Nose: Nose normal.     Mouth/Throat:     Mouth: Mucous membranes are moist.  Eyes:     Extraocular Movements: Extraocular movements intact.     Conjunctiva/sclera: Conjunctivae normal.  Cardiovascular:     Rate and Rhythm: Normal rate and regular rhythm.     Heart sounds: No murmur heard. Pulmonary:     Effort: Pulmonary effort is normal. No respiratory distress.     Breath sounds: Normal breath sounds. No wheezing, rhonchi or rales.  Chest:     Chest wall: No tenderness.  Abdominal:     General: There is  no distension.     Palpations: Abdomen is soft.     Tenderness: There is no abdominal tenderness.  Musculoskeletal:        General: No swelling. Normal range of motion.     Cervical back: Normal range of motion and neck supple.     Right lower leg: No edema.     Left lower leg: No edema.  Skin:    General: Skin is warm and dry.     Coloration: Skin is not jaundiced or pale.  Neurological:     General: No focal deficit present.     Mental Status: She is alert and oriented to person, place, and time.  Psychiatric:        Mood and Affect: Mood normal.        Behavior: Behavior normal.     ED Results / Procedures / Treatments   Labs (all labs ordered are listed, but only abnormal results are displayed) Labs Reviewed  RESP PANEL BY RT-PCR (RSV, FLU A&B, COVID)  RVPGX2 - Abnormal; Notable for the following components:      Result Value   SARS Coronavirus 2 by RT PCR POSITIVE (*)    All other components within normal limits  BASIC METABOLIC PANEL - Abnormal; Notable for the following components:   Glucose, Bld 108 (*)    All other components within normal limits  URINALYSIS, ROUTINE W REFLEX MICROSCOPIC - Abnormal; Notable for the following components:   APPearance HAZY (*)    Hgb urine dipstick MODERATE (*)    Bacteria, UA RARE (*)    All other components within normal limits  CBC WITH DIFFERENTIAL/PLATELET - Abnormal; Notable for the following components:   RBC 3.78 (*)    Hemoglobin 11.8 (*)    HCT 35.3 (*)    All other components within normal limits  CBC WITH DIFFERENTIAL/PLATELET    EKG None  Radiology No results found.  Procedures Procedures  {Document cardiac monitor, telemetry assessment procedure when appropriate:1}  Medications Ordered in ED Medications - No data to display  ED Course/ Medical Decision Making/ A&P   {   Click here for ABCD2, HEART and other calculatorsREFRESH Note before signing :1}                              Medical Decision  Making Amount and/or Complexity of Data Reviewed Labs: ordered. Radiology: ordered.  Risk OTC drugs. Prescription drug management.   This patient presents to the ED for concern of generalized weakness, this involves an extensive number of treatment options, and is a complaint that carries with it a high risk of complications and morbidity.  The differential diagnosis includes dehydration, deconditioning, infection, metabolic derangements, PE   Co morbidities  that complicate the patient evaluation  SVT, GERD, seizures, atrial fibrillation, CHF, arthritis   Additional history obtained:  Additional history obtained from patient's granddaughter External records from outside source obtained and reviewed including EMR   Lab Tests:  I Ordered, and personally interpreted labs.  The pertinent results include: Baseline anemia, no leukocytosis, equivocal urinalysis, normal kidney function, normal electrolytes.  Patient tested positive for COVID-19.   Imaging Studies ordered:  I ordered imaging studies including CTA chest, CT of abdomen and pelvis I independently visualized and interpreted imaging which showed lower lobe PEs without evidence of heart strain;*** I agree with the radiologist interpretation   Cardiac Monitoring: / EKG:  The patient was maintained on a cardiac monitor.  I personally viewed and interpreted the cardiac monitored which showed an underlying rhythm of: Sinus rhythm  Problem List / ED Course / Critical interventions / Medication management  Patient presents for ongoing symptoms of cough, fatigue, nausea, poor p.o. intake, generalized weakness.  She tested positive for COVID-19 6 days ago.  She tested positive again today.  Initial lab work is otherwise unremarkable.  On exam, patient is well-appearing.  She denies any areas of discomfort.  Current breathing is unlabored.  SpO2 is normal on room air.  She does endorse some current nausea.  Zofran and GI cocktail  were ordered.  Given persistent symptoms, CT imaging studies were ordered.  CTA did in fact show a lower lobe PEs.  Heparin was ordered.  Patient to be admitted for further management. I ordered medication including Zofran and GI cocktail for nausea; IV fluids for hydration; heparin for PEs Reevaluation of the patient after these medicines showed that the patient improved I have reviewed the patients home medicines and have made adjustments as needed   Social Determinants of Health:  Lives at home with family  {Document critical care time when appropriate:1} {Document review of labs and clinical decision tools ie heart score, Chads2Vasc2 etc:1}  {Document your independent review of radiology images, and any outside records:1} {Document your discussion with family members, caretakers, and with consultants:1} {Document social determinants of health affecting pt's care:1} {Document your decision making why or why not admission, treatments were needed:1} Final Clinical Impression(s) / ED Diagnoses Final diagnoses:  None    Rx / DC Orders ED Discharge Orders     None

## 2023-11-20 NOTE — ED Triage Notes (Signed)
Pt arrived via POV c/o worsening weakness X 2 weeks. Pt endorses recent cough and acid reflux.

## 2023-11-20 NOTE — H&P (Signed)
History and Physical    Patient: Denise Espinoza JXB:[id number] DOB: January 22, 1930 DOA: 11/20/2023 DOS: the patient was seen and examined on 11/21/2023 PCP: Tacy Learn, FNP  Patient coming from: Home  Chief Complaint:  Chief Complaint  Patient presents with   Weakness   HPI: Denise Espinoza is a 89 y.o. female with medical history significant of SVT, arthritis, history of seizures, SSS status post pacemaker who presents emergency department due to 2-week onset of cough, generalized weakness, poor oral intake and nausea patient was seen at PCPs office about 6 days ago when she was diagnosed with COVID-19, family states that patient's condition has not improved, she now requires increased assistance with ambulation at home, cough worsening such that it affects patient ability to sleep, 12 patient denies any discomfort at bedside.  ED Course:  In the emergency department, BP was 183/73, other vital signs were within the normal range.  Workup in the ED showed normocytic anemia, normal BMP except for blood glucose of 108.  Urinalysis was unimpressive for UTI.  SARS coronavirus 2 was positive.  Influenza A, B, RSV was negative. CT angiography chest showed bilateral lower lobe lobar and segmental pulmonary emboli.  No evidence of right heart strain.  Patchy groundglass opacities and interlobular septal thickening in the lower lobes. Tylenol was given.  Zofran was also given.  Patient was started on IV heparin drip due to pulmonary embolus.  Hospitalist was asked to admit patient for further evaluation and management.  Review of Systems: Review of systems as noted in the HPI. All other systems reviewed and are negative.   Past Medical History:  Diagnosis Date   Allergic rhinitis    Anosmia    Arthritis    Essential hypertension 11/21/2023   Full dentures    History of diverticulitis    Insomnia    Seizure (HCC) 2020   Senile osteoporosis    Sick sinus syndrome (HCC)    SOB (shortness of  breath)    SVT (supraventricular tachycardia) (HCC)    Varicose vein of leg    left   Wears glasses    Past Surgical History:  Procedure Laterality Date   COLON RESECTION  1989   diverticulitis   COLONOSCOPY     ESOPHAGEAL DILATION  2015   endoscopy   PACEMAKER IMPLANT N/A 05/23/2019   Procedure: PACEMAKER IMPLANT;  Surgeon: Regan Lemming, MD;  Location: [facility];  Service: Cardiovascular;  Laterality: N/A;   POSTERIOR CERVICAL FUSION/FORAMINOTOMY N/A 02/11/2019   Procedure: POSTERIOR CERVICAL INSTRUMENTATION REMOVAL CERVICAL TWO;  Surgeon: Lisbeth Renshaw, MD;  Location: [facility] OR;  Service: Neurosurgery;  Laterality: N/A;   POSTERIOR CERVICAL LAMINECTOMY N/A 02/06/2019   Procedure: Cervical one LAMINECTOMY, Occiput-Cervical four fusion;  Surgeon: Lisbeth Renshaw, MD;  Location: [facility] OR;  Service: Neurosurgery;  Laterality: N/A;    Social History:  reports that she has never smoked. She has never used smokeless tobacco. She reports current alcohol use. She reports that she does not use drugs.   Allergies  Allergen Reactions   Penicillins Hives, Swelling, Rash and Other (See Comments)    Did it involve swelling of the face/tongue/throat, SOB, or low BP? #  #  #  YES  #  #  #  Did it involve sudden or severe rash/hives, skin peeling, or any reaction on the inside of your mouth or nose? No Did you need to seek medical attention at a hospital or doctor's office? #  #  #  YES  #  #  #  When did it last happen?    89 years old.    Codeine Phosphate [Codeine] Other (See Comments)    headache    Family History  Problem Relation Age of Onset   Cancer Mother    High blood pressure Father    Emphysema Brother      Prior to Admission medications   Medication Sig Start Date End Date Taking? Authorizing Provider  acetaminophen (TYLENOL) 325 MG tablet Take 1-2 tablets (325-650 mg total) by mouth every 4 (four) hours as needed for mild pain. 03/10/19   Love, Evlyn Kanner, PA-C   Dextran 70-Hypromellose (ARTIFICIAL TEARS) 0.1-0.3 % SOLN Place 1 drop into both eyes 3 (three) times daily as needed. 03/24/19   [provider]  donepezil (ARICEPT) 5 MG tablet Take 1 tablet every day by oral route. 03/09/22   [provider]  metoprolol tartrate (LOPRESSOR) 25 MG tablet Take 1/2 (one-half) tablet by mouth twice daily 09/27/23   Marinus Maw, MD  oxybutynin (DITROPAN-XL) 10 MG 24 hr tablet Take 10 mg by mouth daily as needed. 04/05/23   [provider]  pantoprazole (PROTONIX) 40 MG tablet Take 1 tablet (40 mg total) by mouth daily. 07/26/23   Aida Raider, NP  polyethylene glycol powder (GLYCOLAX/MIRALAX) 17 GM/SCOOP powder Take 17g daily in 8 ounces of water or coffee. 07/26/23   Aida Raider, NP  rosuvastatin (CRESTOR) 5 MG tablet Take 1 tablet (5 mg total) by mouth daily. 04/17/19   Sharee Holster, NP  sertraline (ZOLOFT) 25 MG tablet Take 25 mg by mouth daily. 07/18/23   [provider]  simethicone (MYLICON) 80 MG chewable tablet Chew 1 tablet (80 mg total) by mouth every 6 (six) hours as needed for flatulence. 07/26/23   Aida Raider, NP  traZODone (DESYREL) 150 MG tablet Take 150 mg by mouth at bedtime. 07/04/23   [provider]    Physical Exam: BP (!) 170/76   Pulse 66   Temp 98 F (36.7 C) (Oral)   Resp 16   Ht 5\' 3"  (1.6 m)   Wt 67.5 kg   SpO2 96%   BMI 26.36 kg/m   General: 89 y.o. year-old female well developed well nourished in no acute distress.  Alert and oriented x3. HEENT: NCAT, EOMI Neck: Supple, trachea medial Cardiovascular: Regular rate and rhythm with no rubs or gallops.  No thyromegaly or JVD noted.  No lower extremity edema. 2/4 pulses in all 4 extremities. Respiratory: Clear to auscultation with no wheezes or rales. Good inspiratory effort. Abdomen: Soft, nontender nondistended with normal bowel sounds x4 quadrants. Muskuloskeletal: No cyanosis, clubbing or edema noted  bilaterally Neuro: CN II-XII intact, strength 5/5 x 4, sensation, reflexes intact Skin: No ulcerative lesions noted or rashes Psychiatry: Judgement and insight appear normal. Mood is appropriate for condition and setting          Labs on Admission:  Basic Metabolic Panel: Recent Labs  Lab 11/20/23 1800  NA 138  K 3.8  CL 104  CO2 26  GLUCOSE 108*  BUN 19  CREATININE 0.83  CALCIUM 9.3   Liver Function Tests: No results for input(s): "AST", "ALT", "ALKPHOS", "BILITOT", "PROT", "ALBUMIN" in the last 168 hours. No results for input(s): "LIPASE", "AMYLASE" in the last 168 hours. No results for input(s): "AMMONIA" in the last 168 hours. CBC: Recent Labs  Lab 11/20/23 1905  WBC 6.1  NEUTROABS 4.0  HGB 11.8*  HCT 35.3*  MCV 93.4  PLT  162   Cardiac Enzymes: No results for input(s): "CKTOTAL", "CKMB", "CKMBINDEX", "TROPONINI" in the last 168 hours.  BNP (last 3 results) No results for input(s): "BNP" in the last 8760 hours.  ProBNP (last 3 results) No results for input(s): "PROBNP" in the last 8760 hours.  CBG: No results for input(s): "GLUCAP" in the last 168 hours.  Radiological Exams on Admission: CT Angio Chest PE W and/or Wo Contrast Result Date: 11/20/2023 CLINICAL DATA:  Worsening weakness for 2 weeks. Recent cough and acid reflux. PE suspected. Abdominal pain, acute, non localized. EXAM: CT ANGIOGRAPHY CHEST CT ABDOMEN AND PELVIS WITH CONTRAST TECHNIQUE: Multidetector CT imaging of the chest was performed using the standard protocol during bolus administration of intravenous contrast. Multiplanar CT image reconstructions and MIPs were obtained to evaluate the vascular anatomy. Multidetector CT imaging of the abdomen and pelvis was performed using the standard protocol during bolus administration of intravenous contrast. RADIATION DOSE REDUCTION: This exam was performed according to the departmental dose-optimization program which includes automated exposure control,  adjustment of the mA and/or kV according to patient size and/or use of iterative reconstruction technique. CONTRAST:  OMNIPAQUE IOHEXOL 350 MG/ML SOLN COMPARISON:  CT abdomen and pelvis 02/13/2017 and radiograph 03/24/2021 FINDINGS: CTA CHEST FINDINGS Cardiovascular: Bilateral lower lobe lobar and segmental pulmonary emboli. No evidence of right heart strain. No pericardial effusion. Aortic atherosclerotic calcification. Mediastinum/Nodes: Trachea is unremarkable. Patulous esophagus with air-fluid level likely related to reflux. No lymphadenopathy. Lungs/Pleura: Mild emphysema. Diffuse bronchial wall thickening. Biapical pleural-parenchymal scarring. Mucous plugging in the lower lobes. Patchy ground-glass opacities and interlobular septal thickening in the lower lobes. No pleural effusion or pneumothorax. Musculoskeletal: No acute fracture. Review of the MIP images confirms the above findings. CT ABDOMEN and PELVIS FINDINGS Hepatobiliary: Mild intrahepatic biliary dilation. The common bile duct is at the upper limits of normal measuring 9 mm. No radiopaque stone. Gallbladder is unremarkable. Hepatic cysts along the falciform ligament. Pancreas: No acute abnormality. Spleen: Unremarkable. Adrenals/Urinary Tract: Normal adrenal glands. No urinary calculi or hydronephrosis. Irregular bladder wall thickening. Small diverticulum in the bladder anteriorly. Stomach/Bowel: Normal caliber large and small bowel. Moderate colonic stool burden. Stomach is within normal limits. Postoperative change about the rectosigmoid junction. Vascular/Lymphatic: Aortic atherosclerosis. No enlarged abdominal or pelvic lymph nodes. Reproductive: No acute abnormality. Other: Postoperative change along the anterior abdominal wall. No free intraperitoneal fluid or air. Musculoskeletal: No acute fracture. Review of the MIP images confirms the above findings. IMPRESSION: 1. Bilateral lower lobe lobar and segmental pulmonary emboli. No  evidence of right heart strain. 2. Patchy ground-glass opacities and interlobular septal thickening in the lower lobes. Differential considerations are edema or atypical infection. 3. Irregular bladder wall thickening, correlate with urinalysis to exclude cystitis. Critical Value/emergent results were called by telephone at the time of interpretation on 11/20/2023 at 10:04 pm to provider Gloris Manchester , who verbally acknowledged these results. Electronically Signed   By: Minerva Fester M.D.   On: 11/20/2023 22:11   CT ABDOMEN PELVIS W CONTRAST Result Date: 11/20/2023 CLINICAL DATA:  Worsening weakness for 2 weeks. Recent cough and acid reflux. PE suspected. Abdominal pain, acute, non localized. EXAM: CT ANGIOGRAPHY CHEST CT ABDOMEN AND PELVIS WITH CONTRAST TECHNIQUE: Multidetector CT imaging of the chest was performed using the standard protocol during bolus administration of intravenous contrast. Multiplanar CT image reconstructions and MIPs were obtained to evaluate the vascular anatomy. Multidetector CT imaging of the abdomen and pelvis was performed using the standard protocol during bolus administration of intravenous  contrast. RADIATION DOSE REDUCTION: This exam was performed according to the departmental dose-optimization program which includes automated exposure control, adjustment of the mA and/or kV according to patient size and/or use of iterative reconstruction technique. CONTRAST:  OMNIPAQUE IOHEXOL 350 MG/ML SOLN COMPARISON:  CT abdomen and pelvis 02/13/2017 and radiograph 03/24/2021 FINDINGS: CTA CHEST FINDINGS Cardiovascular: Bilateral lower lobe lobar and segmental pulmonary emboli. No evidence of right heart strain. No pericardial effusion. Aortic atherosclerotic calcification. Mediastinum/Nodes: Trachea is unremarkable. Patulous esophagus with air-fluid level likely related to reflux. No lymphadenopathy. Lungs/Pleura: Mild emphysema. Diffuse bronchial wall thickening. Biapical  pleural-parenchymal scarring. Mucous plugging in the lower lobes. Patchy ground-glass opacities and interlobular septal thickening in the lower lobes. No pleural effusion or pneumothorax. Musculoskeletal: No acute fracture. Review of the MIP images confirms the above findings. CT ABDOMEN and PELVIS FINDINGS Hepatobiliary: Mild intrahepatic biliary dilation. The common bile duct is at the upper limits of normal measuring 9 mm. No radiopaque stone. Gallbladder is unremarkable. Hepatic cysts along the falciform ligament. Pancreas: No acute abnormality. Spleen: Unremarkable. Adrenals/Urinary Tract: Normal adrenal glands. No urinary calculi or hydronephrosis. Irregular bladder wall thickening. Small diverticulum in the bladder anteriorly. Stomach/Bowel: Normal caliber large and small bowel. Moderate colonic stool burden. Stomach is within normal limits. Postoperative change about the rectosigmoid junction. Vascular/Lymphatic: Aortic atherosclerosis. No enlarged abdominal or pelvic lymph nodes. Reproductive: No acute abnormality. Other: Postoperative change along the anterior abdominal wall. No free intraperitoneal fluid or air. Musculoskeletal: No acute fracture. Review of the MIP images confirms the above findings. IMPRESSION: 1. Bilateral lower lobe lobar and segmental pulmonary emboli. No evidence of right heart strain. 2. Patchy ground-glass opacities and interlobular septal thickening in the lower lobes. Differential considerations are edema or atypical infection. 3. Irregular bladder wall thickening, correlate with urinalysis to exclude cystitis. Critical Value/emergent results were called by telephone at the time of interpretation on 11/20/2023 at 10:04 pm to provider Gloris Manchester , who verbally acknowledged these results. Electronically Signed   By: Minerva Fester M.D.   On: 11/20/2023 22:11    EKG: I independently viewed the EKG done and my findings are as followed: Atrial fibrillation with rate  control  Assessment/Plan Present on Admission:  Pulmonary embolism (HCC)  GERD without esophagitis  Principal Problem:   Pulmonary embolism (HCC) Active Problems:   GERD without esophagitis   COVID-19 virus infection   Essential hypertension   Mixed hyperlipidemia   Dementia without behavioral disturbance (HCC)   SSS (sick sinus syndrome) (HCC)  Pulmonary embolism CT angiography chest with contrast showed bilateral lower lobe lobar and segmental pulmonary emboli Patient was started on IV heparin drip with plan to transition to DOAC in the morning Bilateral lower extremity ultrasound will be done in the morning Echocardiogram will be done in the morning  COVID-19 virus infection Patient was diagnosed with COVID-19 virus infection about 6 days ago in PCPs office.  She still tested positive for COVID at this time. Patient was in no acute distress and was not requiring oxygen at this time.  No indication for steroids at this time Continue albuterol q.6h as needed Continue Mucinex, Robitussin  Continue Tylenol p.r.n. for fever Continue incentive telemetry  Essential hypertension Continue Lopressor Continue IV hydralazine 10 mg every 6 hours for SBP greater than 170  Mixed hyperlipidemia Continue Crestor  GERD Continue Protonix  Dementia Continue Aricept  SSS s/p pacemaker Stable.  Remote device check reviewed on 08/20/2023, leads and battery were normal.  DVT prophylaxis: Heparin drip  Code Status: DNR  Family Communication: None at bedside  Consults: None  Severity of Illness: The appropriate patient status for this patient is INPATIENT. Inpatient status is judged to be reasonable and necessary in order to provide the required intensity of service to ensure the patient's safety. The patient's presenting symptoms, physical exam findings, and initial radiographic and laboratory data in the context of their chronic comorbidities is felt to place them at high risk  for further clinical deterioration. Furthermore, it is not anticipated that the patient will be medically stable for discharge from the hospital within 2 midnights of admission.   * I certify that at the point of admission it is my clinical judgment that the patient will require inpatient hospital care spanning beyond 2 midnights from the point of admission due to high intensity of service, high risk for further deterioration and high frequency of surveillance required.*  Author: Frankey Shown, DO 11/21/2023 3:22 AM  For on call review [email or web address].

## 2023-11-20 NOTE — ED Notes (Signed)
Denise Espinoza [phone] would like an update when available.

## 2023-11-21 ENCOUNTER — Other Ambulatory Visit: Payer: Self-pay

## 2023-11-21 ENCOUNTER — Observation Stay (HOSPITAL_COMMUNITY): Payer: Medicare HMO

## 2023-11-21 ENCOUNTER — Other Ambulatory Visit (HOSPITAL_COMMUNITY): Payer: Medicare HMO

## 2023-11-21 ENCOUNTER — Encounter (HOSPITAL_COMMUNITY): Payer: Self-pay | Admitting: Internal Medicine

## 2023-11-21 ENCOUNTER — Other Ambulatory Visit (HOSPITAL_COMMUNITY): Payer: Self-pay | Admitting: *Deleted

## 2023-11-21 DIAGNOSIS — I495 Sick sinus syndrome: Secondary | ICD-10-CM | POA: Diagnosis present

## 2023-11-21 DIAGNOSIS — T8481XA Embolism due to internal orthopedic prosthetic devices, implants and grafts, initial encounter: Secondary | ICD-10-CM

## 2023-11-21 DIAGNOSIS — K219 Gastro-esophageal reflux disease without esophagitis: Secondary | ICD-10-CM | POA: Diagnosis present

## 2023-11-21 DIAGNOSIS — Z1883 Retained stone or crystalline fragments: Secondary | ICD-10-CM | POA: Diagnosis not present

## 2023-11-21 DIAGNOSIS — Z66 Do not resuscitate: Secondary | ICD-10-CM | POA: Diagnosis present

## 2023-11-21 DIAGNOSIS — U071 COVID-19: Secondary | ICD-10-CM | POA: Diagnosis present

## 2023-11-21 DIAGNOSIS — G90A Postural orthostatic tachycardia syndrome (POTS): Secondary | ICD-10-CM | POA: Diagnosis present

## 2023-11-21 DIAGNOSIS — I2695 Cement embolism of pulmonary artery without acute cor pulmonale: Secondary | ICD-10-CM | POA: Diagnosis not present

## 2023-11-21 DIAGNOSIS — I8392 Asymptomatic varicose veins of left lower extremity: Secondary | ICD-10-CM | POA: Diagnosis present

## 2023-11-21 DIAGNOSIS — M1990 Unspecified osteoarthritis, unspecified site: Secondary | ICD-10-CM | POA: Diagnosis present

## 2023-11-21 DIAGNOSIS — E782 Mixed hyperlipidemia: Secondary | ICD-10-CM | POA: Insufficient documentation

## 2023-11-21 DIAGNOSIS — Z79899 Other long term (current) drug therapy: Secondary | ICD-10-CM | POA: Diagnosis not present

## 2023-11-21 DIAGNOSIS — D649 Anemia, unspecified: Secondary | ICD-10-CM | POA: Diagnosis present

## 2023-11-21 DIAGNOSIS — I2602 Saddle embolus of pulmonary artery with acute cor pulmonale: Secondary | ICD-10-CM | POA: Diagnosis not present

## 2023-11-21 DIAGNOSIS — F0390 Unspecified dementia without behavioral disturbance: Secondary | ICD-10-CM | POA: Insufficient documentation

## 2023-11-21 DIAGNOSIS — I110 Hypertensive heart disease with heart failure: Secondary | ICD-10-CM | POA: Diagnosis present

## 2023-11-21 DIAGNOSIS — Z880 Allergy status to penicillin: Secondary | ICD-10-CM | POA: Diagnosis not present

## 2023-11-21 DIAGNOSIS — I5032 Chronic diastolic (congestive) heart failure: Secondary | ICD-10-CM | POA: Diagnosis present

## 2023-11-21 DIAGNOSIS — R059 Cough, unspecified: Secondary | ICD-10-CM | POA: Diagnosis present

## 2023-11-21 DIAGNOSIS — M4801 Spinal stenosis, occipito-atlanto-axial region: Secondary | ICD-10-CM | POA: Diagnosis present

## 2023-11-21 DIAGNOSIS — I493 Ventricular premature depolarization: Secondary | ICD-10-CM | POA: Diagnosis present

## 2023-11-21 DIAGNOSIS — G959 Disease of spinal cord, unspecified: Secondary | ICD-10-CM | POA: Diagnosis present

## 2023-11-21 DIAGNOSIS — Z950 Presence of cardiac pacemaker: Secondary | ICD-10-CM | POA: Diagnosis not present

## 2023-11-21 DIAGNOSIS — N390 Urinary tract infection, site not specified: Secondary | ICD-10-CM | POA: Diagnosis present

## 2023-11-21 DIAGNOSIS — F03A0 Unspecified dementia, mild, without behavioral disturbance, psychotic disturbance, mood disturbance, and anxiety: Secondary | ICD-10-CM | POA: Diagnosis present

## 2023-11-21 DIAGNOSIS — I10 Essential (primary) hypertension: Secondary | ICD-10-CM

## 2023-11-21 DIAGNOSIS — I2694 Multiple subsegmental pulmonary emboli without acute cor pulmonale: Secondary | ICD-10-CM | POA: Diagnosis present

## 2023-11-21 DIAGNOSIS — Z885 Allergy status to narcotic agent status: Secondary | ICD-10-CM | POA: Diagnosis not present

## 2023-11-21 DIAGNOSIS — I4891 Unspecified atrial fibrillation: Secondary | ICD-10-CM | POA: Diagnosis present

## 2023-11-21 DIAGNOSIS — M810 Age-related osteoporosis without current pathological fracture: Secondary | ICD-10-CM | POA: Diagnosis present

## 2023-11-21 HISTORY — DX: Essential (primary) hypertension: I10

## 2023-11-21 LAB — COMPREHENSIVE METABOLIC PANEL
ALT: 11 U/L (ref 0–44)
AST: 16 U/L (ref 15–41)
Albumin: 3.5 g/dL (ref 3.5–5.0)
Alkaline Phosphatase: 56 U/L (ref 38–126)
Anion gap: 9 (ref 5–15)
BUN: 15 mg/dL (ref 8–23)
CO2: 26 mmol/L (ref 22–32)
Calcium: 8.9 mg/dL (ref 8.9–10.3)
Chloride: 105 mmol/L (ref 98–111)
Creatinine, Ser: 0.68 mg/dL (ref 0.44–1.00)
GFR, Estimated: >60 mL/min (ref >60)
Glucose, Bld: 89 mg/dL (ref 70–99)
Potassium: 3.3 mmol/L — ABNORMAL LOW (ref 3.5–5.1)
Sodium: 140 mmol/L (ref 135–145)
Total Bilirubin: 0.7 mg/dL (ref 0.0–1.2)
Total Protein: 6.4 g/dL — ABNORMAL LOW (ref 6.5–8.1)

## 2023-11-21 LAB — CBC
HCT: 33.7 % — ABNORMAL LOW (ref 36.0–46.0)
Hemoglobin: 11.3 g/dL — ABNORMAL LOW (ref 12.0–15.0)
MCH: 30.9 pg (ref 26.0–34.0)
MCHC: 33.5 g/dL (ref 30.0–36.0)
MCV: 92.1 fL (ref 80.0–100.0)
Platelets: 144 10*3/uL — ABNORMAL LOW (ref 150–400)
RBC: 3.66 MIL/uL — ABNORMAL LOW (ref 3.87–5.11)
RDW: 12.7 % (ref 11.5–15.5)
WBC: 5.1 10*3/uL (ref 4.0–10.5)
nRBC: 0 % (ref 0.0–0.2)

## 2023-11-21 LAB — MAGNESIUM: Magnesium: 1.7 mg/dL (ref 1.7–2.4)

## 2023-11-21 LAB — HEPARIN LEVEL (UNFRACTIONATED)
Heparin Unfractionated: 0.75 [IU]/mL — ABNORMAL HIGH (ref 0.30–0.70)
Heparin Unfractionated: 0.19 [IU]/mL — ABNORMAL LOW (ref 0.30–0.70)

## 2023-11-21 LAB — PHOSPHORUS: Phosphorus: 3.1 mg/dL (ref 2.5–4.6)

## 2023-11-21 MED ORDER — HYDRALAZINE HCL 20 MG/ML IJ SOLN: 10.0000 mg | Freq: Four times a day (QID) | INTRAMUSCULAR | Status: DC | PRN

## 2023-11-21 MED ORDER — ACETAMINOPHEN 325 MG PO TABS: 650.0000 mg | ORAL_TABLET | Freq: Four times a day (QID) | ORAL | Status: DC | PRN

## 2023-11-21 MED ORDER — ONDANSETRON HCL 4 MG/2ML IJ SOLN: 4.0000 mg | Freq: Four times a day (QID) | INTRAMUSCULAR | Status: DC | PRN

## 2023-11-21 MED ORDER — ALBUTEROL SULFATE (2.5 MG/3ML) 0.083% IN NEBU
2.5000 mg | INHALATION_SOLUTION | Freq: Two times a day (BID) | RESPIRATORY_TRACT | Status: DC
  Administered 2023-11-22 – 2023-11-23 (×3): 2.5 mg via RESPIRATORY_TRACT
  Filled 2023-11-21 (×3): qty 3

## 2023-11-21 MED ORDER — ALBUTEROL SULFATE (2.5 MG/3ML) 0.083% IN NEBU
2.5000 mg | INHALATION_SOLUTION | Freq: Four times a day (QID) | RESPIRATORY_TRACT | Status: DC
  Administered 2023-11-21 (×3): 2.5 mg via RESPIRATORY_TRACT
  Filled 2023-11-21 (×3): qty 3

## 2023-11-21 MED ORDER — SIMETHICONE 80 MG PO CHEW: 80.0000 mg | CHEWABLE_TABLET | Freq: Four times a day (QID) | ORAL | Status: DC | PRN

## 2023-11-21 MED ORDER — TRAZODONE HCL 50 MG PO TABS
150.0000 mg | ORAL_TABLET | Freq: Every day | ORAL | Status: DC
  Administered 2023-11-21 – 2023-11-22 (×2): 150 mg via ORAL
  Filled 2023-11-21 (×2): qty 3

## 2023-11-21 MED ORDER — SERTRALINE HCL 50 MG PO TABS
25.0000 mg | ORAL_TABLET | Freq: Every day | ORAL | Status: DC
  Administered 2023-11-23: 25 mg via ORAL
  Filled 2023-11-21: qty 1

## 2023-11-21 MED ORDER — ROSUVASTATIN CALCIUM 10 MG PO TABS
5.0000 mg | ORAL_TABLET | Freq: Every day | ORAL | Status: DC
  Administered 2023-11-23: 5 mg via ORAL
  Filled 2023-11-21 (×5): qty 1

## 2023-11-21 MED ORDER — DEXTROMETHORPHAN POLISTIREX ER 30 MG/5ML PO SUER
15.0000 mg | Freq: Every day | ORAL | Status: DC
  Administered 2023-11-21 – 2023-11-22 (×2): 15 mg via ORAL
  Filled 2023-11-21 (×2): qty 2.5
  Filled 2023-11-21 (×3): qty 5

## 2023-11-21 MED ORDER — GUAIFENESIN-DM 100-10 MG/5ML PO SYRP: 5.0000 mL | ORAL_SOLUTION | ORAL | Status: DC | PRN

## 2023-11-21 MED ORDER — ALBUTEROL SULFATE HFA 108 (90 BASE) MCG/ACT IN AERS: 2.0000 | INHALATION_SPRAY | Freq: Four times a day (QID) | RESPIRATORY_TRACT | Status: DC

## 2023-11-21 MED ORDER — DONEPEZIL HCL 5 MG PO TABS
5.0000 mg | ORAL_TABLET | Freq: Every day | ORAL | Status: DC
  Administered 2023-11-21 – 2023-11-22 (×2): 5 mg via ORAL
  Filled 2023-11-21 (×2): qty 1

## 2023-11-21 MED ORDER — ONDANSETRON HCL 4 MG PO TABS: 4.0000 mg | ORAL_TABLET | Freq: Four times a day (QID) | ORAL | Status: DC | PRN

## 2023-11-21 MED ORDER — ACETAMINOPHEN 650 MG RE SUPP: 650.0000 mg | Freq: Four times a day (QID) | RECTAL | Status: DC | PRN

## 2023-11-21 MED ORDER — DM-GUAIFENESIN ER 30-600 MG PO TB12
1.0000 | ORAL_TABLET | Freq: Two times a day (BID) | ORAL | Status: DC
  Filled 2023-11-21: qty 1

## 2023-11-21 MED ORDER — HEPARIN BOLUS VIA INFUSION
1900.0000 [IU] | Freq: Once | INTRAVENOUS | Status: AC
  Administered 2023-11-21: 1900 [IU] via INTRAVENOUS
  Filled 2023-11-21: qty 1900

## 2023-11-21 MED ORDER — METOPROLOL TARTRATE 25 MG PO TABS
25.0000 mg | ORAL_TABLET | Freq: Two times a day (BID) | ORAL | Status: DC
  Administered 2023-11-22 – 2023-11-23 (×3): 25 mg via ORAL
  Filled 2023-11-21 (×3): qty 1

## 2023-11-21 MED ORDER — PANTOPRAZOLE SODIUM 40 MG PO TBEC
40.0000 mg | DELAYED_RELEASE_TABLET | Freq: Every day | ORAL | Status: DC
  Administered 2023-11-22 – 2023-11-23 (×2): 40 mg via ORAL
  Filled 2023-11-21 (×2): qty 1

## 2023-11-21 NOTE — Progress Notes (Signed)
PHARMACY - ANTICOAGULATION CONSULT NOTE  Pharmacy Consult for Heparin drip Indication: pulmonary embolus  Allergies  Allergen Reactions   Penicillins Hives, Swelling, Rash and Other (See Comments)    Did it involve swelling of the face/tongue/throat, SOB, or low BP? #  #  #  YES  #  #  #  Did it involve sudden or severe rash/hives, skin peeling, or any reaction on the inside of your mouth or nose? No Did you need to seek medical attention at a hospital or doctor's office? #  #  #  YES  #  #  #  When did it last happen?    89 years old.    Codeine Phosphate [Codeine] Other (See Comments)    headache    Patient Measurements: Height: 5\' 3"  (160 cm) Weight: 67.5 kg (148 lb 13 oz) IBW/kg (Calculated) : 52.4 Heparin Dosing Weight: 66 kg  Vital Signs: BP: 167/67 (02/12 0615) Pulse Rate: 77 (02/12 0615)  Labs: Recent Labs    11/20/23 1800 11/20/23 1905 11/21/23 0427  HGB  --  11.8* 11.3*  HCT  --  35.3* 33.7*  PLT  --  162 144*  APTT 30  --   --   LABPROT 14.8  --   --   INR 1.1  --   --   HEPARINUNFRC  --   --  0.75*  CREATININE 0.83  --  0.68    Estimated Creatinine Clearance: 42.2 mL/min (by C-G formula based on SCr of 0.68 mg/dL).   Medical History: Past Medical History:  Diagnosis Date   Allergic rhinitis    Anosmia    Arthritis    Essential hypertension 11/21/2023   Full dentures    History of diverticulitis    Insomnia    Seizure (HCC) 2020   Senile osteoporosis    Sick sinus syndrome (HCC)    SOB (shortness of breath)    SVT (supraventricular tachycardia) (HCC)    Varicose vein of leg    left   Wears glasses     Medications:  (Not in a hospital admission)  Scheduled:   albuterol  2.5 mg Nebulization Q6H   dextromethorphan-guaiFENesin  1 tablet Oral BID   Infusions:   heparin 1,100 Units/hr (11/21/23 0700)    Assessment: 89 yo F to start heparin drip for Pulmonary Embolism.  Recent Covid diagnosis 6 days PTA Chest CT: Bilateral lower lobe  lobar and segmental pulmonary emboli. No evidence of right heart strain. No anticoagulants PTA   HL 0.75- slightly supratherapeutic, level drawn early by lab CBC WNL   Goal of Therapy:  Heparin level 0.3-0.7 units/ml Monitor platelets by anticoagulation protocol: Yes   Plan:  Decrease heparin infusion to 950 units/hr Check anti-Xa level in 8 hours and daily Continue to monitor H&H and platelets  Judeth Cornfield, PharmD Clinical Pharmacist 11/21/2023 8:09 AM

## 2023-11-21 NOTE — Progress Notes (Signed)
[facility]   NOTE Denise Espinoza WGN:[id number]  DOB: 1929-12-08  DOA: 11/20/2023  PCP: Tacy Learn, FNP  11/21/2023,7:25 AM   LOS: 0 days      Code Status: DNR  From: Home  current Dispo: Unclear   89 year old female Known spinal stenosis with myelopathy central cord syndrome followed previously due to by Dr. Santiago Bur displaced type II odontoid fracture requiring C1 laminectomy C2 ORIF 02/06/2019 previous dysarthria  POTS syndrome with dizziness/orthostasis complicated by bradycardia and syncope--- sick sinus syndrome requiring PPM 05/2019 HFpEF previous echo EF 60-65% Chronic anemia Hyperlipidemia Reflux  Presenting from home 2-week history of cough generalized weakness poor intake--had been seen previously 11/14/2023 cough congestion nausea 5 days as well as urinary symptoms--was apparently started on Cipro to cover possibility of both COVID as well as UTI advised to drink plenty of fluids and keep a cool-mist humidifier/quarantining Brought to ED by granddaughter [place] [phone] as she felt she had gotten work needing increase assisted with ambulation and poor sleep secondary to cough Lab work revealed BUN/creatinine 19/0.8 WBC 6.1 hemoglobin 11.8 platelet 162 Urine culture pending, CT as below  Events 2/11 admitted  procedures 2/11 CT abdomen pelvis showed bilateral lower lobe segmental pulmonary emboli with no evidence of right heart strain, patchy groundglass opacities and interlobular septal thickening in lower lobes irregular bladder thickening correlate with urinalysis to exclude cystitis    Plan  COVID-19 infection Infection seems mild and do not think that she requires steroids or other advanced treatment Monitor trends over the next 24 hours specifically her hydration as there is risk of volume depletion with Covid Can use empiric Mucinex 1 tab twice daily, add Delsym in the evening Multiple bilateral segmental pulmonary emboli Likely precipitant was  COVID--- continues on heparin gtt. As she is hemodynamically stable I am not sure if she requires an echocardiogram Will transition to DOAC in a.m. as does not seem to require any advanced therapies ?  UTI Started on Cipro in the outpatient Urine culture is pending at this time blood cultures are pending If negative would discontinue antibiotics Atrial fibrillation CHADVASC >4 with sick sinus syndrome Seems to be in sinus rhythm currently with some PVCs Home medication Toprol 25 twice daily resumed HFpEF previous echo 2022 60-65% As above discussion Mild to moderate dementia Continues on Aricept 5 daily--- resume sertraline 25 daily which is a home med for as well as trazodone 150 at bedtime Central cord syndrome with ORIF left 01/2019 follows with Dr. Feliberto Harts at this time  Updated granddaugther at #above  DVT prophylaxis: Heparin GTT  Status is: Observation The patient remains OBS appropriate and will d/c before 2 midnights.      Subjective: Awake coherent states that she has some pain when coughing-no fever no chills no nausea Mouth feels a little bit dry she has not really had anything to drink She has no chills no rigor She is hungry and has not eaten  Objective + exam Vitals:   11/21/23 0230 11/21/23 0245 11/21/23 0530 11/21/23 0615  BP: (!) 183/61 (!) 170/76 (!) 161/72 (!) 167/67  Pulse:   75 77  Resp:   16 13  Temp:      TempSrc:      SpO2:   95% 97%  Weight:      Height:       Filed Weights   11/20/23 1648  Weight: 67.5 kg    Examination: EOMI NCAT no focal deficit no icterus no pallor wheeze rales rhonchi S1-S2  no murmur seems to be in sinus Looks younger than stated age Anicteric Abdomen soft no rebound no guarding Chest clear  Data Reviewed: reviewed   CBC    Component Value Date/Time   WBC 5.1 11/21/2023 0427   RBC 3.66 (L) 11/21/2023 0427   HGB 11.3 (L) 11/21/2023 0427   HCT 33.7 (L) 11/21/2023 0427   PLT 144 (L) 11/21/2023 0427    MCV 92.1 11/21/2023 0427   MCH 30.9 11/21/2023 0427   MCHC 33.5 11/21/2023 0427   RDW 12.7 11/21/2023 0427   LYMPHSABS 1.4 11/20/2023 1905   MONOABS 0.6 11/20/2023 1905   EOSABS 0.0 11/20/2023 1905   BASOSABS 0.0 11/20/2023 1905      Latest Ref Rng & Units 11/21/2023    4:27 AM 11/20/2023    6:00 PM 03/25/2021    4:32 AM  CMP  Glucose 70 - 99 mg/dL 89  161  96   BUN 8 - 23 mg/dL 15  19  21    Creatinine 0.44 - 1.00 mg/dL 0.96  0.45  4.09   Sodium 135 - 145 mmol/L 140  138  141   Potassium 3.5 - 5.1 mmol/L 3.3  3.8  3.5   Chloride 98 - 111 mmol/L 105  104  108   CO2 22 - 32 mmol/L 26  26  26    Calcium 8.9 - 10.3 mg/dL 8.9  9.3  8.7   Total Protein 6.5 - 8.1 g/dL 6.4   6.1   Total Bilirubin 0.0 - 1.2 mg/dL 0.7   0.3   Alkaline Phos 38 - 126 U/L 56   65   AST 15 - 41 U/L 16   16   ALT 0 - 44 U/L 11   16     Scheduled Meds:  albuterol  2.5 mg Nebulization Q6H   dextromethorphan  15 mg Oral QHS   sertraline  25 mg Oral Daily   traZODone  150 mg Oral QHS   Continuous Infusions:  heparin 950 Units/hr (11/21/23 0933)    Time  55  Rhetta Mura, MD  [facility]

## 2023-11-21 NOTE — Progress Notes (Signed)
PHARMACY - ANTICOAGULATION CONSULT NOTE  Pharmacy Consult for Heparin drip Indication: pulmonary embolus  Allergies  Allergen Reactions   Penicillins Hives, Swelling, Rash and Other (See Comments)    swelling of the face/tongue/throat, SOB, or low BP   Codeine Phosphate [Codeine] Other (See Comments)    headache    Patient Measurements: Height: 5\' 3"  (160 cm) Weight: 67.5 kg (148 lb 13 oz) IBW/kg (Calculated) : 52.4 Heparin Dosing Weight: 66 kg  Vital Signs: Temp: 98.4 F (36.9 C) (02/12 1534) Temp Source: Oral (02/12 1534) BP: 126/76 (02/12 1534) Pulse Rate: 85 (02/12 1534)  Labs: Recent Labs    11/20/23 1800 11/20/23 1905 11/21/23 0427 11/21/23 1531  HGB  --  11.8* 11.3*  --   HCT  --  35.3* 33.7*  --   PLT  --  162 144*  --   APTT 30  --   --   --   LABPROT 14.8  --   --   --   INR 1.1  --   --   --   HEPARINUNFRC  --   --  0.75* 0.19*  CREATININE 0.83  --  0.68  --     Estimated Creatinine Clearance: 42.2 mL/min (by C-G formula based on SCr of 0.68 mg/dL).   Medical History: Past Medical History:  Diagnosis Date   Allergic rhinitis    Anosmia    Arthritis    Essential hypertension 11/21/2023   Full dentures    History of diverticulitis    Insomnia    Seizure (HCC) 2020   Senile osteoporosis    Sick sinus syndrome (HCC)    SOB (shortness of breath)    SVT (supraventricular tachycardia) (HCC)    Varicose vein of leg    left   Wears glasses     Medications:  Medications Prior to Admission  Medication Sig Dispense Refill Last Dose/Taking   acetaminophen (TYLENOL) 325 MG tablet Take 1-2 tablets (325-650 mg total) by mouth every 4 (four) hours as needed for mild pain. (Patient not taking: Reported on 11/21/2023)   Not Taking   ARIPiprazole (ABILIFY) 2 MG tablet Take 1 tablet by mouth daily.      benzonatate (TESSALON) 200 MG capsule TAKE 1 CAPSULE BY MOUTH THREE TIMES DAILY AS NEEDED FOR COUGH FOR 10 DAYS      ciprofloxacin (CIPRO) 500 MG tablet  Take 1 tablet by mouth every 12 (twelve) hours.      Dextran 70-Hypromellose (ARTIFICIAL TEARS) 0.1-0.3 % SOLN Place 1 drop into both eyes 3 (three) times daily as needed. (Patient not taking: Reported on 11/21/2023)   Not Taking   donepezil (ARICEPT) 5 MG tablet Take 1 tablet every day by oral route.      metoprolol tartrate (LOPRESSOR) 25 MG tablet Take 1/2 (one-half) tablet by mouth twice daily 90 tablet 1    ondansetron (ZOFRAN-ODT) 4 MG disintegrating tablet Take 4 mg by mouth 2 (two) times daily.      oxybutynin (DITROPAN-XL) 10 MG 24 hr tablet Take 10 mg by mouth daily as needed.      pantoprazole (PROTONIX) 40 MG tablet Take 1 tablet (40 mg total) by mouth daily. 90 tablet 1    polyethylene glycol powder (GLYCOLAX/MIRALAX) 17 GM/SCOOP powder Take 17g daily in 8 ounces of water or coffee. 255 g 0    rosuvastatin (CRESTOR) 5 MG tablet Take 1 tablet (5 mg total) by mouth daily. 30 tablet 0    sertraline (ZOLOFT) 100 MG tablet Take  1 tablet by mouth daily.      simethicone (MYLICON) 80 MG chewable tablet Chew 1 tablet (80 mg total) by mouth every 6 (six) hours as needed for flatulence. 30 tablet 0    traZODone (DESYREL) 150 MG tablet Take 150 mg by mouth at bedtime.      Scheduled:   albuterol  2.5 mg Nebulization Q6H   dextromethorphan  15 mg Oral QHS   donepezil  5 mg Oral QHS   metoprolol tartrate  25 mg Oral BID   pantoprazole  40 mg Oral Daily   rosuvastatin  5 mg Oral Daily   sertraline  25 mg Oral Daily   traZODone  150 mg Oral QHS   Infusions:   heparin 950 Units/hr (11/21/23 1756)    Assessment: 89 yo F to start heparin drip for Pulmonary Embolism.  Recent Covid diagnosis 6 days PTA Chest CT: Bilateral lower lobe lobar and segmental pulmonary emboli. No evidence of right heart strain. No anticoagulants PTA    Labs: Date Time Results Comments 2/12 1531 HL 0.75 SUPRA-therapeutic,CBC-WNL heparin@1100  un/hr ->950 2/12 1823 HL 0.19 SUB-therapeutic, heparin@ 950  units/hr  Goal of Therapy:  Heparin level 0.3-0.7 units/ml Monitor platelets by anticoagulation protocol: Yes   Plan:  Bolus with heparin 1900 units Increase  heparin infusion rate to 1000 units/hr Check next HL 8 hours after rate change Continue to monitor H&H and platelets  Kaitlyn Franko Rodriguez-Guzman PharmD, BCPS 11/21/2023 6:40 PM

## 2023-11-21 NOTE — Progress Notes (Signed)
Transition of Care Department [facility]) has reviewed patient and no other TOC needs have been identified at this time. We will continue to monitor patient advancement through interdisciplinary progression rounds. If new patient transition needs arise, please place a TOC consult.   11/21/23 1024  TOC Brief Assessment  Insurance and Status Reviewed  Patient has primary care physician Yes  Home environment has been reviewed Son lives with pt.  Prior level of function: Independent.  Prior/Current Home Services No current home services  Social Drivers of Health Review SDOH reviewed no interventions necessary  Readmission risk has been reviewed Yes  Transition of care needs no transition of care needs at this time

## 2023-11-22 ENCOUNTER — Ambulatory Visit: Payer: Medicare Other | Admitting: Gastroenterology

## 2023-11-22 ENCOUNTER — Other Ambulatory Visit (HOSPITAL_COMMUNITY): Payer: Self-pay | Admitting: *Deleted

## 2023-11-22 ENCOUNTER — Other Ambulatory Visit (HOSPITAL_COMMUNITY): Payer: Medicare HMO

## 2023-11-22 ENCOUNTER — Telehealth (HOSPITAL_COMMUNITY): Payer: Self-pay | Admitting: Pharmacy Technician

## 2023-11-22 ENCOUNTER — Other Ambulatory Visit (HOSPITAL_COMMUNITY): Payer: Self-pay

## 2023-11-22 DIAGNOSIS — T8481XA Embolism due to internal orthopedic prosthetic devices, implants and grafts, initial encounter: Secondary | ICD-10-CM | POA: Diagnosis not present

## 2023-11-22 DIAGNOSIS — I2695 Cement embolism of pulmonary artery without acute cor pulmonale: Secondary | ICD-10-CM | POA: Diagnosis not present

## 2023-11-22 DIAGNOSIS — Z1883 Retained stone or crystalline fragments: Secondary | ICD-10-CM | POA: Diagnosis not present

## 2023-11-22 LAB — HEPARIN LEVEL (UNFRACTIONATED): Heparin Unfractionated: 0.65 [IU]/mL (ref 0.30–0.70)

## 2023-11-22 LAB — CUP PACEART REMOTE DEVICE CHECK
Battery Remaining Longevity: 122 mo
Battery Voltage: 3.02 V
Brady Statistic AP VP Percent: 0.35 %
Brady Statistic AP VS Percent: 43.11 %
Brady Statistic AS VP Percent: 0.05 %
Brady Statistic AS VS Percent: 56.49 %
Brady Statistic RA Percent Paced: 43.01 %
Brady Statistic RV Percent Paced: 0.4 %
Date Time Interrogation Session: 20250210214943
Implantable Lead Connection Status: 753985
Implantable Lead Connection Status: 753985
Implantable Lead Implant Date: 20200814
Implantable Lead Implant Date: 20200814
Implantable Lead Location: 753859
Implantable Lead Location: 753860
Implantable Lead Model: 5076
Implantable Lead Model: 5076
Implantable Pulse Generator Implant Date: 20200814
Lead Channel Impedance Value: 285 Ohm
Lead Channel Impedance Value: 342 Ohm
Lead Channel Impedance Value: 418 Ohm
Lead Channel Impedance Value: 475 Ohm
Lead Channel Pacing Threshold Amplitude: 0.5 V
Lead Channel Pacing Threshold Amplitude: 0.75 V
Lead Channel Pacing Threshold Pulse Width: 0.4 ms
Lead Channel Pacing Threshold Pulse Width: 0.4 ms
Lead Channel Sensing Intrinsic Amplitude: 0.875 mV
Lead Channel Sensing Intrinsic Amplitude: 0.875 mV
Lead Channel Sensing Intrinsic Amplitude: 18.75 mV
Lead Channel Sensing Intrinsic Amplitude: 18.75 mV
Lead Channel Setting Pacing Amplitude: 2 V
Lead Channel Setting Pacing Amplitude: 2.5 V
Lead Channel Setting Pacing Pulse Width: 0.4 ms
Lead Channel Setting Sensing Sensitivity: 2 mV
Zone Setting Status: 755011
Zone Setting Status: 755011

## 2023-11-22 LAB — URINE CULTURE

## 2023-11-22 LAB — CBC WITH DIFFERENTIAL/PLATELET
Abs Immature Granulocytes: 0.04 10*3/uL (ref 0.00–0.07)
Basophils Absolute: 0 10*3/uL (ref 0.0–0.1)
Basophils Relative: 0 %
Eosinophils Absolute: 0 10*3/uL (ref 0.0–0.5)
Eosinophils Relative: 0 %
HCT: 32.7 % — ABNORMAL LOW (ref 36.0–46.0)
Hemoglobin: 10.5 g/dL — ABNORMAL LOW (ref 12.0–15.0)
Immature Granulocytes: 1 %
Lymphocytes Relative: 32 %
Lymphs Abs: 1.7 10*3/uL (ref 0.7–4.0)
MCH: 30.3 pg (ref 26.0–34.0)
MCHC: 32.1 g/dL (ref 30.0–36.0)
MCV: 94.2 fL (ref 80.0–100.0)
Monocytes Absolute: 0.6 10*3/uL (ref 0.1–1.0)
Monocytes Relative: 12 %
Neutro Abs: 2.9 10*3/uL (ref 1.7–7.7)
Neutrophils Relative %: 55 %
Platelets: 151 10*3/uL (ref 150–400)
RBC: 3.47 MIL/uL — ABNORMAL LOW (ref 3.87–5.11)
RDW: 12.7 % (ref 11.5–15.5)
WBC: 5.2 10*3/uL (ref 4.0–10.5)
nRBC: 0 % (ref 0.0–0.2)

## 2023-11-22 LAB — BASIC METABOLIC PANEL
Anion gap: 7 (ref 5–15)
BUN: 20 mg/dL (ref 8–23)
CO2: 27 mmol/L (ref 22–32)
Calcium: 8.4 mg/dL — ABNORMAL LOW (ref 8.9–10.3)
Chloride: 106 mmol/L (ref 98–111)
Creatinine, Ser: 0.78 mg/dL (ref 0.44–1.00)
GFR, Estimated: >60 mL/min (ref >60)
Glucose, Bld: 88 mg/dL (ref 70–99)
Potassium: 3.4 mmol/L — ABNORMAL LOW (ref 3.5–5.1)
Sodium: 140 mmol/L (ref 135–145)

## 2023-11-22 MED ORDER — APIXABAN 5 MG PO TABS
10.0000 mg | ORAL_TABLET | Freq: Two times a day (BID) | ORAL | Status: DC
  Administered 2023-11-22 – 2023-11-23 (×3): 10 mg via ORAL
  Filled 2023-11-22 (×3): qty 2

## 2023-11-22 MED ORDER — APIXABAN 5 MG PO TABS: 5.0000 mg | ORAL_TABLET | Freq: Two times a day (BID) | ORAL | Status: DC

## 2023-11-22 NOTE — Evaluation (Signed)
Physical Therapy Evaluation Patient Details Name: Denise Espinoza MRN: [id number] DOB: 21-Sep-1930 Today's Date: 11/22/2023  History of Present Illness  Denise Espinoza is a 89 y.o. female with medical history significant of SVT, arthritis, history of seizures, SSS status post pacemaker who presents emergency department due to 2-week onset of cough, generalized weakness, poor oral intake and nausea patient was seen at PCPs office about 6 days ago when she was diagnosed with COVID-19, family states that patient's condition has not improved, she now requires increased assistance with ambulation at home, cough worsening such that it affects patient ability to sleep, 12 patient denies any discomfort at bedside.  Clinical Impression  Pt verbalizing feeling much better.  Pt is slow and increased SOB getting to the edge of the bed.  Sat x 5 minutes with continued dizziness.  Assisted to [facility] with increased dizziness therefore pt was not transferred to chair.  Pt may benefit from SNF as she states home assist is unreliable but she would like to see how she progresses in mobility in the hospital.         If plan is discharge home, recommend the following: A lot of help with walking and/or transfers;A little help with bathing/dressing/bathroom;Assistance with cooking/housework;Help with stairs or ramp for entrance;Assist for transportation   Can travel by private vehicle   Yes    Equipment Recommendations None recommended by PT  Recommendations for Other Services       Functional Status Assessment Patient has had a recent decline in their functional status and demonstrates the ability to make significant improvements in function in a reasonable and predictable amount of time.     Precautions / Restrictions Precautions Precautions: Fall Restrictions Weight Bearing Restrictions Per Provider Order: No      Mobility  Bed Mobility Overal bed mobility: Needs Assistance Bed Mobility: Supine to Sit      Supine to sit: Min assist          Transfers Overall transfer level: Needs assistance Equipment used: Rolling walker (2 wheels) Transfers: Sit to/from Stand Sit to Stand: Min assist           General transfer comment: Pt dizzy sat for 5 minutes without dizziness resolving    Ambulation/Gait   Gait Distance (Feet): 0 Feet                    Pertinent Vitals/Pain Pain Assessment Pain Assessment: No/denies pain    Home Living Family/patient expects to be discharged to:: Private residence Living Arrangements: Children Available Help at Discharge: Available PRN/intermittently Type of Home: House Home Access: Stairs to enter Entrance Stairs-Rails: Right Entrance Stairs-Number of Steps: 2   Home Layout: Able to live on main level with bedroom/bathroom;Laundry or work area in [employer] Equipment: Agricultural consultant (2 wheels);Shower seat - built in;Cane - single point Additional Comments: son lives with pt but is not reliable    Prior Function Prior Level of Function : Independent/Modified Independent             Mobility Comments: household       Extremity/Trunk Assessment        Lower Extremity Assessment Lower Extremity Assessment: Generalized weakness       Communication   Communication Communication: No apparent difficulties    Cognition Arousal: Alert Behavior During Therapy: WFL for tasks assessed/performed   PT - Cognitive impairments: No apparent impairments  Following commands: Intact       Cueing Cueing Techniques: Verbal cues     General Comments      Exercises General Exercises - Lower Extremity Ankle Circles/Pumps: Seated, Both, 10 reps Quad Sets: Seated, Both, 10 reps   Assessment/Plan    PT Assessment Patient needs continued PT services  PT Problem List Decreased strength;Decreased activity tolerance;Decreased balance;Decreased mobility       PT Treatment Interventions  Gait training;Functional mobility training;Balance training;Therapeutic exercise    PT Goals (Current goals can be found in the Care Plan section)  Acute Rehab PT Goals Patient Stated Goal: to be able to walk on my own again PT Goal Formulation: With patient Potential to Achieve Goals: Good    Frequency Min 3X/week        AM-PAC PT "6 Clicks" Mobility  Outcome Measure Help needed turning from your back to your side while in a flat bed without using bedrails?: A Little Help needed moving from lying on your back to sitting on the side of a flat bed without using bedrails?: A Lot Help needed moving to and from a bed to a chair (including a wheelchair)?: A Little Help needed standing up from a chair using your arms (e.g., wheelchair or bedside chair)?: A Little Help needed to walk in hospital room?: A Little Help needed climbing 3-5 steps with a railing? : A Lot 6 Click Score: 16    End of Session   Activity Tolerance: Patient limited by fatigue;Other (comment) (dizziness) Patient left: in bed;with chair alarm set;with call bell/phone within reach   PT Visit Diagnosis: Unsteadiness on feet (R26.81);Muscle weakness (generalized) (M62.81)    Time: 0830-0900 PT Time Calculation (min) (ACUTE ONLY): 30 min   Charges:   PT Evaluation $PT Eval Moderate Complexity: 1 Mod PT Treatments $Therapeutic Activity: 8-22 mins           Virgina Organ, PT CLT [phone]  11/22/2023, 9:09 AM

## 2023-11-22 NOTE — TOC Initial Note (Signed)
Transition of Care [facility]) - Initial/Assessment Note    Patient Details  Name: Denise Espinoza MRN: [id number] Date of Birth: 10-21-1929  Transition of Care [facility]) CM/SW Contact:    Elliot Gault, LCSW Phone Number: 11/22/2023, 1:00 PM  Clinical Narrative:                  Pt admitted from home. PT recommending SNF rehab.  Spoke with pt today to assess and review PT recommendation. Pt reports that her son and granddaughter reside with her at home. She's not sure they can assist at dc but she was hoping she could find someone else to help her. Reviewed pt's covid positive status with pt and questioned whether she thought she would be able to find anyone else able/willing to come in and help. Pt ultimately agreed that she probably could not. Discussed [facility] being the only SNF in the area able to take patients positive for covid. Pt wanted TOC to check with [facility]. She said if [facility] cannot offer, then she will agree to [facility].   Awaiting return call from [facility]. Referral sent to [facility]. Pt aware that if she improves enough to be able to manage at home, the dc plan can be changed.  TOC will start SNF insurance auth and follow.  Expected Discharge Plan: Skilled Nursing Facility Barriers to Discharge: Continued Medical Work up   Patient Goals and CMS Choice Patient states their goals for this hospitalization and ongoing recovery are:: get better CMS Medicare.gov Compare Post Acute Care list provided to:: Patient Choice offered to / list presented to : Patient      Expected Discharge Plan and Services In-house Referral: Clinical Social Work   Post Acute Care Choice: Skilled Nursing Facility Living arrangements for the past 2 months: Single Family Home                                      Prior Living Arrangements/Services Living arrangements for the past 2 months: Single Family Home Lives with:: Adult Children Patient language and need for  interpreter reviewed:: Yes Do you feel safe going back to the place where you live?: Yes      Need for Family Participation in Patient Care: No (Comment) Care giver support system in place?: Yes (comment) Current home services: DME Criminal Activity/Legal Involvement Pertinent to Current Situation/Hospitalization: No - Comment as needed  Activities of Daily Living   ADL Screening (condition at time of admission) Independently performs ADLs?: No Does the patient have a NEW difficulty with bathing/dressing/toileting/self-feeding that is expected to last >3 days?: Yes (Initiates electronic notice to provider for possible OT consult) Does the patient have a NEW difficulty with getting in/out of bed, walking, or climbing stairs that is expected to last >3 days?: Yes (Initiates electronic notice to provider for possible PT consult) Does the patient have a NEW difficulty with communication that is expected to last >3 days?: No Is the patient deaf or have difficulty hearing?: No Does the patient have difficulty seeing, even when wearing glasses/contacts?: No Does the patient have difficulty concentrating, remembering, or making decisions?: No  Permission Sought/Granted Permission sought to share information with : Facility Industrial/product designer granted to share information with : Yes, Verbal Permission Granted     Permission granted to share info w AGENCY: SNFs        Emotional Assessment Appearance:: Appears stated  age Attitude/Demeanor/Rapport: Engaged Affect (typically observed): Pleasant Orientation: : Oriented to Self, Oriented to Place, Oriented to  Time, Oriented to Situation Alcohol / Substance Use: Not Applicable Psych Involvement: No (comment)  Admission diagnosis:  Pulmonary embolism (HCC) [I26.99] Multiple subsegmental pulmonary emboli without acute cor pulmonale (HCC) [I26.94] Patient Active Problem List   Diagnosis Date Noted   COVID-19 virus infection  11/21/2023   Essential hypertension 11/21/2023   Mixed hyperlipidemia 11/21/2023   Dementia without behavioral disturbance (HCC) 11/21/2023   SSS (sick sinus syndrome) (HCC) 11/21/2023   Pulmonary embolism (HCC) 11/20/2023   Acute respiratory failure with hypoxia (HCC) 03/25/2021   AF (paroxysmal atrial fibrillation) (HCC) 03/25/2021   Hypokalemia 03/25/2021   Elevated brain natriuretic peptide (BNP) level 03/25/2021   Chronic heart failure with preserved ejection fraction (HFpEF) (HCC) 03/25/2021   Syncope 05/18/2019   Slurred speech 05/18/2019   Seizures (HCC)    Word finding difficulty 05/17/2019   Supraventricular tachycardia (HCC) 03/22/2019   Dyslipidemia 03/22/2019   GERD without esophagitis 03/22/2019   Chronic constipation 03/22/2019   Insomnia disorder 03/22/2019   H/O Cervical  laminectomy 03/13/2019   Acute lower UTI    Acute blood loss anemia    Orthostatic hypotension    History of supraventricular tachycardia    Central cord syndrome (HCC) 02/15/2019   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   PCP:  Tacy Learn, FNP Pharmacy:   [facility] [address], [place] - [address] [place] [address] Phone: [phone] Fax: [phone]     Social Drivers of Health (SDOH) Social History: SDOH Screenings   Food Insecurity: No Food Insecurity (11/21/2023)  Housing: Low Risk  (11/21/2023)  Transportation Needs: No Transportation Needs (11/21/2023)  Utilities: Not At Risk (11/21/2023)  Social Connections: Socially Isolated (11/21/2023)  Tobacco Use: Low Risk  (11/20/2023)   SDOH Interventions:     Readmission Risk Interventions     No data to display

## 2023-11-22 NOTE — Discharge Instructions (Signed)
Information on my medicine - ELIQUIS (apixaban)   Why was Eliquis prescribed for you? Eliquis was prescribed to treat blood clots that may have been found in the veins of your legs (deep vein thrombosis) or in your lungs (pulmonary embolism) and to reduce the risk of them occurring again.  What do You need to know about Eliquis ? The starting dose is 10 mg (two 5 mg tablets) taken TWICE daily for the FIRST SEVEN (7) DAYS, then on 11/29/23 the dose is reduced to ONE 5 mg tablet taken TWICE daily.  Eliquis may be taken with or without food.   Try to take the dose about the same time in the morning and in the evening. If you have difficulty swallowing the tablet whole please discuss with your pharmacist how to take the medication safely.  Take Eliquis exactly as prescribed and DO NOT stop taking Eliquis without talking to the doctor who prescribed the medication.  Stopping may increase your risk of developing a new blood clot.  Refill your prescription before you run out.  After discharge, you should have regular check-up appointments with your healthcare provider that is prescribing your Eliquis.    What do you do if you miss a dose? If a dose of ELIQUIS is not taken at the scheduled time, take it as soon as possible on the same day and twice-daily administration should be resumed. The dose should not be doubled to make up for a missed dose.  Important Safety Information A possible side effect of Eliquis is bleeding. You should call your healthcare provider right away if you experience any of the following: Bleeding from an injury or your nose that does not stop. Unusual colored urine (red or dark brown) or unusual colored stools (red or black). Unusual bruising for unknown reasons. A serious fall or if you hit your head (even if there is no bleeding).  Some medicines may interact with Eliquis and might increase your risk of bleeding or clotting while on Eliquis. To help avoid this,  consult your healthcare provider or pharmacist prior to using any new prescription or non-prescription medications, including herbals, vitamins, non-steroidal anti-inflammatory drugs (NSAIDs) and supplements.  This website has more information on Eliquis (apixaban): http://[email or web address].com/eliquis/home

## 2023-11-22 NOTE — Telephone Encounter (Signed)
Patient Product/process development scientist completed.    The patient is insured through [employer]. Patient has Medicare and is not eligible for a copay card, but may be able to apply for patient assistance or Medicare RX Payment Plan (Patient Must reach out to their plan, if eligible for payment plan), if available.    Ran test claim for Eliquis 5 mg and the current 30 day co-pay is $537.68 due to a deductible.   This test claim was processed through [facility]- copay amounts may vary at other pharmacies due to pharmacy/plan contracts, or as the patient moves through the different stages of their insurance plan.     Denise Espinoza, CPHT Pharmacy Technician III Certified Patient Advocate [facility] Patient Advocate Team Direct Number: [phone]  Fax: [phone]

## 2023-11-22 NOTE — Plan of Care (Signed)
  Problem: Acute Rehab PT Goals(only PT should resolve) Goal: Pt Will Go Supine/Side To Sit Flowsheets (Taken 11/22/2023 0907) Pt will go Supine/Side to Sit: with modified independence Goal: Patient Will Transfer Sit To/From Stand Flowsheets (Taken 11/22/2023 0907) Patient will transfer sit to/from stand: with modified independence Goal: Pt Will Ambulate Flowsheets (Taken 11/22/2023 0907) Pt will Ambulate:  50 feet  with contact guard assist  with rolling walker

## 2023-11-22 NOTE — Progress Notes (Signed)
 PHARMACY - ANTICOAGULATION CONSULT NOTE  Pharmacy Consult for Heparin drip Indication: pulmonary embolus  Allergies  Allergen Reactions   Penicillins Hives, Swelling, Rash and Other (See Comments)    swelling of the face/tongue/throat, SOB, or low BP   Codeine Phosphate [Codeine] Other (See Comments)    headache    Patient Measurements: Height: 5\' 3"  (160 cm) Weight: 67.5 kg (148 lb 13 oz) IBW/kg (Calculated) : 52.4 Heparin Dosing Weight: 66 kg  Vital Signs: Temp: 98.2 F (36.8 C) (02/13 0457) Temp Source: Oral (02/13 0457) BP: 149/77 (02/13 0457) Pulse Rate: 72 (02/13 0457)  Labs: Recent Labs    11/20/23 1800 11/20/23 1905 11/20/23 1905 11/21/23 0427 11/21/23 1531 11/22/23 0312  HGB  --  11.8*   < > 11.3*  --  10.5*  HCT  --  35.3*  --  33.7*  --  32.7*  PLT  --  162  --  144*  --  151  APTT 30  --   --   --   --   --   LABPROT 14.8  --   --   --   --   --   INR 1.1  --   --   --   --   --   HEPARINUNFRC  --   --   --  0.75* 0.19* 0.65  CREATININE 0.83  --   --  0.68  --  0.78   < > = values in this interval not displayed.    Estimated Creatinine Clearance: 42.2 mL/min (by C-G formula based on SCr of 0.78 mg/dL).   Medical History: Past Medical History:  Diagnosis Date   Allergic rhinitis    Anosmia    Arthritis    Essential hypertension 11/21/2023   Full dentures    History of diverticulitis    Insomnia    Seizure (HCC) 2020   Senile osteoporosis    Sick sinus syndrome (HCC)    SOB (shortness of breath)    SVT (supraventricular tachycardia) (HCC)    Varicose vein of leg    left   Wears glasses     Medications:  Medications Prior to Admission  Medication Sig Dispense Refill Last Dose/Taking   acetaminophen (TYLENOL) 325 MG tablet Take 1-2 tablets (325-650 mg total) by mouth every 4 (four) hours as needed for mild pain.      ARIPiprazole (ABILIFY) 2 MG tablet Take 1 tablet by mouth daily.      benzonatate (TESSALON) 200 MG capsule TAKE 1  CAPSULE BY MOUTH THREE TIMES DAILY AS NEEDED FOR COUGH FOR 10 DAYS      ciprofloxacin (CIPRO) 500 MG tablet Take 1 tablet by mouth every 12 (twelve) hours.      Dextran 70-Hypromellose (ARTIFICIAL TEARS) 0.1-0.3 % SOLN Place 1 drop into both eyes 3 (three) times daily as needed.      donepezil (ARICEPT) 5 MG tablet Take 1 tablet every day by oral route.      metoprolol tartrate (LOPRESSOR) 25 MG tablet Take 1/2 (one-half) tablet by mouth twice daily 90 tablet 1    ondansetron (ZOFRAN-ODT) 4 MG disintegrating tablet Take 4 mg by mouth 2 (two) times daily.      oxybutynin (DITROPAN-XL) 10 MG 24 hr tablet Take 10 mg by mouth daily as needed.      pantoprazole (PROTONIX) 40 MG tablet Take 1 tablet (40 mg total) by mouth daily. 90 tablet 1    polyethylene glycol powder (GLYCOLAX/MIRALAX) 17 GM/SCOOP powder Take 17g  daily in 8 ounces of water or coffee. 255 g 0    rosuvastatin (CRESTOR) 5 MG tablet Take 1 tablet (5 mg total) by mouth daily. 30 tablet 0    sertraline (ZOLOFT) 100 MG tablet Take 1 tablet by mouth daily.      simethicone (MYLICON) 80 MG chewable tablet Chew 1 tablet (80 mg total) by mouth every 6 (six) hours as needed for flatulence. 30 tablet 0    traZODone (DESYREL) 150 MG tablet Take 150 mg by mouth at bedtime.      Scheduled:   albuterol  2.5 mg Nebulization BID   dextromethorphan  15 mg Oral QHS   donepezil  5 mg Oral QHS   metoprolol tartrate  25 mg Oral BID   pantoprazole  40 mg Oral Daily   rosuvastatin  5 mg Oral Daily   sertraline  25 mg Oral Daily   traZODone  150 mg Oral QHS   Infusions:   heparin 1,000 Units/hr (11/22/23 0420)    Assessment: 89 yo F to start heparin drip for Pulmonary Embolism.  Recent Covid diagnosis 6 days PTA Chest CT: Bilateral lower lobe lobar and segmental pulmonary emboli. No evidence of right heart strain. No anticoagulants PTA    Labs: Date Time Results Comments 2/12 1531 HL 0.75 SUPRA-therapeutic,CBC-WNL heparin@1100  un/hr  ->950 2/12 1823 HL 0.19 SUB-therapeutic, heparin@ 950 units/hr 2/13 0312 HL 0.65 Therapeutic @1000  units/hr  Goal of Therapy:  Heparin level 0.3-0.7 units/ml Monitor platelets by anticoagulation protocol: Yes   Plan:  Continue heparin infusion at 1000 units/hr Check next heparin level in 8 hours  Continue to monitor H&H and platelets  Arabella Merles, PharmD. Clinical Pharmacist 11/22/2023 5:40 AM

## 2023-11-22 NOTE — Progress Notes (Signed)
TRH ROUNDING   NOTE Denise Espinoza ZOX:[id number]  DOB: [birth year]  DOA: 11/20/2023  PCP: Tacy Learn, FNP  11/22/2023,11:17 AM   LOS: 1 day      Code Status: DNR  From: Home  current Dispo: Unclear   89 year old female Known spinal stenosis with myelopathy central cord syndrome followed previously due to by Dr. Santiago Bur displaced type II odontoid fracture requiring C1 laminectomy C2 ORIF 02/06/2019 previous dysarthria  POTS syndrome with dizziness/orthostasis complicated by bradycardia and syncope--- sick sinus syndrome requiring PPM 05/2019 HFpEF previous echo EF 60-65% Chronic anemia Hyperlipidemia Reflux  Presenting from home 2-week history of cough generalized weakness poor intake--had been seen previously 11/14/2023 cough congestion nausea 5 days as well as urinary symptoms--was apparently started on Cipro to cover possibility of both COVID as well as UTI advised to drink plenty of fluids and keep a cool-mist humidifier/quarantining Brought to ED by granddaughter Martinique [phone] as she felt she had gotten work needing increase assisted with ambulation and poor sleep secondary to cough Lab work revealed BUN/creatinine 19/0.8 WBC 6.1 hemoglobin 11.8 platelet 162 Urine culture pending, CT as below  Events 2/11 admitted  procedures 2/11 CT abdomen pelvis showed bilateral lower lobe segmental pulmonary emboli with no evidence of right heart strain, patchy groundglass opacities and interlobular septal thickening in lower lobes irregular bladder thickening correlate with urinalysis to exclude cystitis   Plan  COVID-19 infection Infection seems mild  Can use empiric Mucinex 1 tab twice daily, add Delsym in the evening Multiple bilateral segmental pulmonary emboli Likely precipitant was COVID--- heparin gtt -->elliquis today Await Echo--re-rdered ?  UTI Started on Cipro in the outpatient Urine culture shows multiple species.  Stop Cipro Atrial fibrillation CHADVASC >4  with sick sinus syndrome Seems to be in sinus rhythm currently with some PVCs--d/c tele today Home medication Toprol 25 twice daily resumed HFpEF previous echo 2022 60-65% As above discussion Mild to moderate dementia Continues on Aricept 5 daily--- resume sertraline 25 daily which is a home med for as well as trazodone 150 at bedtime Central cord syndrome with ORIF left 01/2019 follows with Dr. Feliberto Harts at this time  Updated grand-daughter Can d/c to SNF in 24 hours  DVT prophylaxis: Eliquis  Status is: Observation The patient remains OBS appropriate and will d/c before 2 midnights.   Subjective: Coherent awake alert No cp fever chills rigor PT note reviewed  Objective + exam Vitals:   11/21/23 2041 11/21/23 2354 11/22/23 0457 11/22/23 0812  BP:  115/65 (!) 149/77   Pulse:  72 72   Resp:  16 17   Temp:  97.6 F (36.4 C) 98.2 F (36.8 C)   TempSrc:  Oral Oral   SpO2: 97% 97% 95% 97%  Weight:      Height:       Filed Weights   11/20/23 1648  Weight: 67.5 kg    Examination:  EOMI NCAT no focal deficit no icterus no wheeze rales rhonchi S1-S2 no murmur seems to be in sinus younger than stated age Anicteric Abdomen soft no rebound no guarding  Data Reviewed: reviewed   CBC    Component Value Date/Time   WBC 5.2 11/22/2023 0312   RBC 3.47 (L) 11/22/2023 0312   HGB 10.5 (L) 11/22/2023 0312   HCT 32.7 (L) 11/22/2023 0312   PLT 151 11/22/2023 0312   MCV 94.2 11/22/2023 0312   MCH 30.3 11/22/2023 0312   MCHC 32.1 11/22/2023 0312   RDW 12.7 11/22/2023 0312   LYMPHSABS  1.7 11/22/2023 0312   MONOABS 0.6 11/22/2023 0312   EOSABS 0.0 11/22/2023 0312   BASOSABS 0.0 11/22/2023 0312      Latest Ref Rng & Units 11/22/2023    3:12 AM 11/21/2023    4:27 AM 11/20/2023    6:00 PM  CMP  Glucose 70 - 99 mg/dL 88  89  409   BUN 8 - 23 mg/dL 20  15  19    Creatinine 0.44 - 1.00 mg/dL 8.11  9.14  7.82   Sodium 135 - 145 mmol/L 140  140  138   Potassium 3.5 -  5.1 mmol/L 3.4  3.3  3.8   Chloride 98 - 111 mmol/L 106  105  104   CO2 22 - 32 mmol/L 27  26  26    Calcium 8.9 - 10.3 mg/dL 8.4  8.9  9.3   Total Protein 6.5 - 8.1 g/dL  6.4    Total Bilirubin 0.0 - 1.2 mg/dL  0.7    Alkaline Phos 38 - 126 U/L  56    AST 15 - 41 U/L  16    ALT 0 - 44 U/L  11      Scheduled Meds:  albuterol  2.5 mg Nebulization BID   apixaban  10 mg Oral BID   Followed by   Melene Muller ON 11/29/2023] apixaban  5 mg Oral BID   dextromethorphan  15 mg Oral QHS   donepezil  5 mg Oral QHS   metoprolol tartrate  25 mg Oral BID   pantoprazole  40 mg Oral Daily   rosuvastatin  5 mg Oral Daily   sertraline  25 mg Oral Daily   traZODone  150 mg Oral QHS   Continuous Infusions:  Time  25  Rhetta Mura, MD  [facility]

## 2023-11-23 ENCOUNTER — Inpatient Hospital Stay (HOSPITAL_COMMUNITY): Payer: Medicare HMO

## 2023-11-23 DIAGNOSIS — I2695 Cement embolism of pulmonary artery without acute cor pulmonale: Secondary | ICD-10-CM | POA: Diagnosis not present

## 2023-11-23 DIAGNOSIS — I2602 Saddle embolus of pulmonary artery with acute cor pulmonale: Secondary | ICD-10-CM

## 2023-11-23 DIAGNOSIS — T8481XA Embolism due to internal orthopedic prosthetic devices, implants and grafts, initial encounter: Secondary | ICD-10-CM | POA: Diagnosis not present

## 2023-11-23 DIAGNOSIS — Z1883 Retained stone or crystalline fragments: Secondary | ICD-10-CM | POA: Diagnosis not present

## 2023-11-23 LAB — BASIC METABOLIC PANEL
Anion gap: 7 (ref 5–15)
BUN: 17 mg/dL (ref 8–23)
CO2: 27 mmol/L (ref 22–32)
Calcium: 8.5 mg/dL — ABNORMAL LOW (ref 8.9–10.3)
Chloride: 106 mmol/L (ref 98–111)
Creatinine, Ser: 0.7 mg/dL (ref 0.44–1.00)
GFR, Estimated: >60 mL/min (ref >60)
Glucose, Bld: 86 mg/dL (ref 70–99)
Potassium: 3.8 mmol/L (ref 3.5–5.1)
Sodium: 140 mmol/L (ref 135–145)

## 2023-11-23 LAB — ECHOCARDIOGRAM COMPLETE
Area-P 1/2: 3.85 cm2
Calc EF: 73.4 %
Height: 63 in
P 1/2 time: 331 ms
S' Lateral: 2.3 cm
Single Plane A2C EF: 72.2 %
Single Plane A4C EF: 75.5 %
Weight: 2380.97 [oz_av]

## 2023-11-23 LAB — CBC WITH DIFFERENTIAL/PLATELET
Abs Immature Granulocytes: 0.03 10*3/uL (ref 0.00–0.07)
Basophils Absolute: 0 10*3/uL (ref 0.0–0.1)
Basophils Relative: 0 %
Eosinophils Absolute: 0 10*3/uL (ref 0.0–0.5)
Eosinophils Relative: 0 %
HCT: 32.8 % — ABNORMAL LOW (ref 36.0–46.0)
Hemoglobin: 10.9 g/dL — ABNORMAL LOW (ref 12.0–15.0)
Immature Granulocytes: 1 %
Lymphocytes Relative: 31 %
Lymphs Abs: 1.4 10*3/uL (ref 0.7–4.0)
MCH: 31.5 pg (ref 26.0–34.0)
MCHC: 33.2 g/dL (ref 30.0–36.0)
MCV: 94.8 fL (ref 80.0–100.0)
Monocytes Absolute: 0.6 10*3/uL (ref 0.1–1.0)
Monocytes Relative: 12 %
Neutro Abs: 2.5 10*3/uL (ref 1.7–7.7)
Neutrophils Relative %: 56 %
Platelets: 152 10*3/uL (ref 150–400)
RBC: 3.46 MIL/uL — ABNORMAL LOW (ref 3.87–5.11)
RDW: 13 % (ref 11.5–15.5)
WBC: 4.5 10*3/uL (ref 4.0–10.5)
nRBC: 0 % (ref 0.0–0.2)

## 2023-11-23 MED ORDER — APIXABAN 5 MG PO TABS: ORAL_TABLET | ORAL | Status: AC

## 2023-11-23 MED ORDER — SERTRALINE HCL 100 MG PO TABS: 100.0000 mg | ORAL_TABLET | Freq: Every day | ORAL | 3 refills | Status: AC

## 2023-11-23 MED ORDER — DONEPEZIL HCL 5 MG PO TABS
5.0000 mg | ORAL_TABLET | Freq: Every day | ORAL | 0 refills | Status: AC
Start: 2023-11-23 — End: ?

## 2023-11-23 MED ORDER — ARIPIPRAZOLE 2 MG PO TABS: 2.0000 mg | ORAL_TABLET | Freq: Every day | ORAL | 3 refills | Status: AC

## 2023-11-23 MED ORDER — DEXTROMETHORPHAN POLISTIREX ER 30 MG/5ML PO SUER: 15.0000 mg | Freq: Every day | ORAL | 0 refills | Status: AC

## 2023-11-23 MED ORDER — TRAZODONE HCL 150 MG PO TABS: 150.0000 mg | ORAL_TABLET | Freq: Every day | ORAL | 3 refills | Status: AC

## 2023-11-23 NOTE — Progress Notes (Signed)
Report called to [facility] LPN at [facility]. Granddaughter to transport per secure chat with Child psychotherapist.

## 2023-11-23 NOTE — TOC Transition Note (Signed)
Transition of Care [facility]) - Discharge Note   Patient Details  Name: Denise Espinoza MRN: [id number] Date of Birth: [birth year]  Transition [facility]) CM/SW Contact:  Karn Cassis, LCSW Phone Number: 11/23/2023, 11:49 AM   Clinical Narrative: Pt d/c today. She accepts bed at [facility] in [place] as other facilities require 10 day quarantine. SNF auth received. RN given number to call report. Pt's granddaughter in room will provide transportation. Pt's step-daughter Pam also updated. D/C summary sent to SNF.       Final next level of care: Skilled Nursing Facility Barriers to Discharge: Barriers Resolved   Patient Goals and CMS Choice Patient states their goals for this hospitalization and ongoing recovery are:: get better CMS Medicare.gov Compare Post Acute Care list provided to:: Patient Choice offered to / list presented to : Patient      Discharge Placement              Patient chooses bed at: [facility] Patient to be transferred to facility by: granddaughter Name of family member notified: granddaughter and step-daughter Patient and family notified of of transfer: 11/23/23  Discharge Plan and Services Additional resources added to the After Visit Summary for   In-house Referral: Clinical Social Work   Post Acute Care Choice: Skilled Nursing Facility                               Social Drivers of Health (SDOH) Interventions SDOH Screenings   Food Insecurity: No Food Insecurity (11/21/2023)  Housing: Low Risk  (11/21/2023)  Transportation Needs: No Transportation Needs (11/21/2023)  Utilities: Not At Risk (11/21/2023)  Social Connections: Socially Isolated (11/21/2023)  Tobacco Use: Low Risk  (11/20/2023)     Readmission Risk Interventions     No data to display

## 2023-11-23 NOTE — Care Management Important Message (Signed)
Important Message  Patient Details  Name: Denise Espinoza MRN: [id number] Date of Birth: [birth year]   Important Message Given:  N/A - LOS <3 / Initial given by admissions     Corey Harold 11/23/2023, 11:49 AM

## 2023-11-23 NOTE — Evaluation (Signed)
Occupational Therapy Evaluation Patient Details Name: Denise Espinoza MRN: [id number] DOB: 1929/12/01 Today's Date: 11/23/2023   History of Present Illness   Early Laker is a 89 y.o. female with medical history significant of SVT, arthritis, history of seizures, SSS status post pacemaker who presents emergency department due to 2-week onset of cough, generalized weakness, poor oral intake and nausea patient was seen at PCPs office about 6 days ago when she was diagnosed with COVID-19, family states that patient's condition has not improved, she now requires increased assistance with ambulation at home, cough worsening such that it affects patient ability to sleep, 12 patient denies any discomfort at bedside. (per DO)     Clinical Impressions Pt agreeable to OT evaluation. Pt required min A for bed mobility to sit at EOB. Min to [facility] for transfer to the chair using RW. Min A to boost from bed with more CGA for steps to chair. Pt has limited help at home and is a fall risk at this time. Extended time and supervision for lower body ADL's with pt seated at EOB. Good B UE strength and functional use. Pt left in the chair with call bell within reach. Pt will benefit from continued OT in the hospital and recommended venue below to increase strength, balance, and endurance for safe ADL's.        If plan is discharge home, recommend the following:   A little help with walking and/or transfers;A little help with bathing/dressing/bathroom;Assistance with cooking/housework;Assist for transportation;Help with stairs or ramp for entrance     Functional Status Assessment   Patient has had a recent decline in their functional status and demonstrates the ability to make significant improvements in function in a reasonable and predictable amount of time.     Equipment Recommendations   None recommended by OT             Precautions/Restrictions   Precautions Precautions: Fall Recall of  Precautions/Restrictions: Intact Restrictions Weight Bearing Restrictions Per Provider Order: No     Mobility Bed Mobility Overal bed mobility: Needs Assistance Bed Mobility: Supine to Sit     Supine to sit: Min assist     General bed mobility comments: Single hand held assist to pull up to sit; assist to scoot to EOB.    Transfers Overall transfer level: Needs assistance Equipment used: Rolling walker (2 wheels) Transfers: Sit to/from Stand, Bed to chair/wheelchair/BSC Sit to Stand: Min assist     Step pivot transfers: Min assist, Contact guard assist     General transfer comment: Min A to boost form EOB via holding the RW in place. More CGA for steps to chair with RW. Pt reported dizziness in sitting prior to standing. Pt did not report dizziness resolving.      Balance Overall balance assessment: Needs assistance Sitting-balance support: No upper extremity supported, Feet supported Sitting balance-Leahy Scale: Fair Sitting balance - Comments: seated at EOB   Standing balance support: Bilateral upper extremity supported, During functional activity, Reliant on assistive device for balance Standing balance-Leahy Scale: Poor Standing balance comment: poor to fair using RW                           ADL either performed or assessed with clinical judgement   ADL Overall ADL's : Needs assistance/impaired Eating/Feeding: Modified independent;Sitting   Grooming: Set up;Sitting   Upper Body Bathing: Set up;Sitting   Lower Body Bathing: Supervison/ safety;Sitting/lateral leans   Upper Body Dressing :  Set up;Sitting   Lower Body Dressing: Supervision/safety;Contact guard assist;Sitting/lateral leans Lower Body Dressing Details (indicate cue type and reason): Extended time needed for pt to doff and don R sock seated at EOB Toilet Transfer: Minimal assistance;Stand-pivot;Rolling walker (2 wheels) Toilet Transfer Details (indicate cue type and reason): Simulated  via EOB to chair transfer. Toileting- Clothing Manipulation and Hygiene: Contact guard assist;Sitting/lateral lean       Functional mobility during ADLs: Contact guard assist;Minimal assistance;Rolling walker (2 wheels)       Vision Baseline Vision/History: 1 Wears glasses Ability to See in Adequate Light: 1 Impaired Patient Visual Report: No change from baseline Vision Assessment?: No apparent visual deficits     Perception Perception: Not tested       Praxis Praxis: Not tested       Pertinent Vitals/Pain Pain Assessment Pain Assessment: Faces Faces Pain Scale: Hurts little more Pain Location: back and neck Pain Descriptors / Indicators: Discomfort Pain Intervention(s): Monitored during session, Repositioned     Extremity/Trunk Assessment Upper Extremity Assessment Upper Extremity Assessment: Overall WFL for tasks assessed (noted arthritis in hands but good functional use today)   Lower Extremity Assessment Lower Extremity Assessment: Defer to PT evaluation   Cervical / Trunk Assessment Cervical / Trunk Assessment: Kyphotic   Communication Communication Communication: No apparent difficulties   Cognition Arousal: Alert Behavior During Therapy: WFL for tasks assessed/performed Cognition: No apparent impairments                               Following commands: Intact       Cueing  General Comments   Cueing Techniques: Verbal cues                 Home Living Family/patient expects to be discharged to:: Private residence Living Arrangements: Children Available Help at Discharge: Available PRN/intermittently Type of Home: House Home Access: Stairs to enter Secretary/administrator of Steps: 2 Entrance Stairs-Rails: Right Home Layout: Able to live on main level with bedroom/bathroom;Laundry or work area in basement     [employer] Shower/Tub: Arts development officer Toilet: Handicapped height     Home Equipment: Agricultural consultant (2  wheels);Shower seat - built in;Cane - single point   Additional Comments: son lives with pt but is not reliable (per pt and chart)      Prior Functioning/Environment Prior Level of Function : Independent/Modified Independent             Mobility Comments: Ambulates outside often. PRN use of RW ADLs Comments: Independent; drives    OT Problem List: Decreased strength;Decreased activity tolerance;Impaired balance (sitting and/or standing)   OT Treatment/Interventions: Self-care/ADL training;Therapeutic exercise;Energy conservation;Patient/family education;Therapeutic activities      OT Goals(Current goals can be found in the care plan section)   Acute Rehab OT Goals Patient Stated Goal: improve function OT Goal Formulation: With patient Time For Goal Achievement: 12/07/23 Potential to Achieve Goals: Good   OT Frequency:  Min 1X/week                                   End of Session Equipment Utilized During Treatment: Rolling walker (2 wheels)  Activity Tolerance: Patient tolerated treatment well Patient left: in chair;with call bell/phone within reach  OT Visit Diagnosis: Unsteadiness on feet (R26.81);Other abnormalities of gait and mobility (R26.89);Muscle weakness (generalized) (M62.81)  Time: 0938-1000 OT Time Calculation (min): 22 min Charges:  OT General Charges $OT Visit: 1 Visit OT Evaluation $OT Eval Low Complexity: 1 Low  Kyre Jeffries OT, MOT   Danie Chandler 11/23/2023, 11:33 AM

## 2023-11-23 NOTE — Plan of Care (Signed)
  Problem: Acute Rehab OT Goals (only OT should resolve) Goal: Pt. Will Perform Grooming Flowsheets (Taken 11/23/2023 1136) Pt Will Perform Grooming:  with modified independence  standing Goal: Pt. Will Perform Lower Body Dressing Flowsheets (Taken 11/23/2023 1136) Pt Will Perform Lower Body Dressing: with modified independence Goal: Pt. Will Transfer To Toilet Flowsheets (Taken 11/23/2023 1136) Pt Will Transfer to Toilet:  with modified independence  ambulating Goal: Pt. Will Perform Toileting-Clothing Manipulation Flowsheets (Taken 11/23/2023 1136) Pt Will Perform Toileting - Clothing Manipulation and hygiene: with modified independence  Innocence Schlotzhauer OT, MOT

## 2023-11-23 NOTE — Progress Notes (Signed)
  Echocardiogram 2D Echocardiogram has been performed.  Janalyn Harder 11/23/2023, 9:11 AM

## 2023-11-23 NOTE — Discharge Summary (Signed)
Physician Discharge Summary  Denise Espinoza UJW:[id number] DOB: [birth year] DOA: 11/20/2023  PCP: Tacy Learn, FNP  Admit date: 11/20/2023 Discharge date: 11/23/2023  Time spent: 55 minutes  Recommendations for Outpatient Follow-up:  Recommend DOAC Eliquis pulmonary embolism for at least 3 months and then possibly discontinue subsequently Needs Chem-12 CBC in about 1 week's time Discharging to skilled facility  Discharge Diagnoses:  MAIN problem for hospitalization   COVID-19 Pulmonary embolism secondary to COVID  Please see below for itemized issues addressed in HOpsital- refer to other progress notes for clarity if needed  Discharge Condition: Improved  Diet recommendation: Heart healthy  Filed Weights   11/20/23 1648  Weight: 67.5 kg    History of present illness:  89 year old female Known spinal stenosis with myelopathy central cord syndrome followed previously due to by Dr. Santiago Bur displaced type II odontoid fracture requiring C1 laminectomy C2 ORIF 02/06/2019 previous dysarthria  POTS syndrome with dizziness/orthostasis complicated by bradycardia and syncope--- sick sinus syndrome requiring PPM 05/2019 HFpEF previous echo EF 60-65% Chronic anemia Hyperlipidemia Reflux   Presenting from home 2-week history of cough generalized weakness poor intake--had been seen previously 11/14/2023 cough congestion nausea 5 days as well as urinary symptoms--was apparently started on Cipro to cover possibility of both COVID as well as UTI advised to drink plenty of fluids and keep a cool-mist humidifier/quarantining Brought to ED by granddaughter [place] [phone] as she felt she had gotten work needing increase assisted with ambulation and poor sleep secondary to cough Lab work revealed BUN/creatinine 19/0.8 WBC 6.1 hemoglobin 11.8 platelet 162 Urine culture pending, CT as below   Events 2/11 admitted   procedures 2/11 CT abdomen pelvis showed bilateral lower lobe  segmental pulmonary emboli with no evidence of right heart strain, patchy groundglass opacities and interlobular septal thickening in lower lobes irregular bladder thickening correlate with urinalysis to exclude cystitis     Plan   COVID-19 infection Infection seems mild-completed Paxlovid during hospital stay Can use empiric Mucinex 1 tab twice daily, add Delsym in the evening Multiple bilateral segmental pulmonary emboli Likely precipitant was COVID--- heparin gtt -->elliquis and will continue with treatment going forward for at least 3 months as this was a provoked event Echocardiogram this hospital stay showed EF 70-75% with indeterminant diastolic function and mild regurg with no significant changes from prior ?  UTI Started on Cipro in the outpatient Urine culture shows multiple species.  Stop Cipro and patient seems to be asymptomatic Atrial fibrillation CHADVASC >4 with sick sinus syndrome Seems to be in sinus rhythm currently with some PVCs--d/c tele today Home medication Toprol 25 twice daily resumed at discharge HFpEF previous echo 2022 60-65% As above discussion Mild to moderate dementia Continues on Aricept 5 daily--- resume sertraline 25 daily which is a home med for as well as trazodone 150 at bedtime and given prescription at discharge Central cord syndrome with ORIF left 01/2019 follows with Dr. Feliberto Harts at this time  Patient seen and stabilized-going to skilled facility today   Discharge Exam: Vitals:   11/23/23 0523 11/23/23 0759  BP: (!) 145/82   Pulse: 71   Resp: 20   Temp: 97.9 F (36.6 C)   SpO2: 98% 92%    Subj on day of d/c   Awake coherent no distress looks well feels fair no nausea no vomiting  General Exam on discharge  EOMI NCAT no focal deficit no icterus no pallor wheeze rales rhonchi Chest is clear no added sound Abdomen is soft no rebound ROM  is intact Power is 5/5   Discharge Instructions   Discharge Instructions      Diet - low sodium heart healthy   Complete by: As directed    Increase activity slowly   Complete by: As directed       Allergies as of 11/23/2023       Reactions   Penicillins Hives, Swelling, Rash, Other (See Comments)   swelling of the face/tongue/throat, SOB, or low BP   Codeine Phosphate [codeine] Other (See Comments)   headache        Medication List     STOP taking these medications    Artificial Tears 0.1-0.3 % Soln Generic drug: Dextran 70-Hypromellose   ciprofloxacin 500 MG tablet Commonly known as: CIPRO   polyethylene glycol powder 17 GM/SCOOP powder Commonly known as: GLYCOLAX/MIRALAX       TAKE these medications    acetaminophen 325 MG tablet Commonly known as: TYLENOL Take 1-2 tablets (325-650 mg total) by mouth every 4 (four) hours as needed for mild pain.   apixaban 5 MG Tabs tablet Commonly known as: ELIQUIS Take 2 tablets (10 mg total) by mouth 2 (two) times daily for 6 days, THEN 1 tablet (5 mg total) 2 (two) times daily. Start taking on: November 23, 2023   ARIPiprazole 2 MG tablet Commonly known as: ABILIFY Take 1 tablet (2 mg total) by mouth daily.   benzonatate 200 MG capsule Commonly known as: TESSALON TAKE 1 CAPSULE BY MOUTH THREE TIMES DAILY AS NEEDED FOR COUGH FOR 10 DAYS   dextromethorphan 30 MG/5ML liquid Commonly known as: DELSYM Take 2.5 mLs (15 mg total) by mouth at bedtime.   donepezil 5 MG tablet Commonly known as: ARICEPT Take 1 tablet (5 mg total) by mouth at bedtime. What changed: See the new instructions.   metoprolol tartrate 25 MG tablet Commonly known as: LOPRESSOR Take 1/2 (one-half) tablet by mouth twice daily   ondansetron 4 MG disintegrating tablet Commonly known as: ZOFRAN-ODT Take 4 mg by mouth 2 (two) times daily.   oxybutynin 10 MG 24 hr tablet Commonly known as: DITROPAN-XL Take 10 mg by mouth daily as needed.   pantoprazole 40 MG tablet Commonly known as: PROTONIX Take 1 tablet (40 mg  total) by mouth daily.   rosuvastatin 5 MG tablet Commonly known as: CRESTOR Take 1 tablet (5 mg total) by mouth daily.   sertraline 100 MG tablet Commonly known as: ZOLOFT Take 1 tablet (100 mg total) by mouth daily.   simethicone 80 MG chewable tablet Commonly known as: MYLICON Chew 1 tablet (80 mg total) by mouth every 6 (six) hours as needed for flatulence.   traZODone 150 MG tablet Commonly known as: DESYREL Take 1 tablet (150 mg total) by mouth at bedtime.       Allergies  Allergen Reactions   Penicillins Hives, Swelling, Rash and Other (See Comments)    swelling of the face/tongue/throat, SOB, or low BP   Codeine Phosphate [Codeine] Other (See Comments)    headache      The results of significant diagnostics from this hospitalization (including imaging, microbiology, ancillary and laboratory) are listed below for reference.    Significant Diagnostic Studies: ECHOCARDIOGRAM COMPLETE Result Date: 11/23/2023    ECHOCARDIOGRAM REPORT   Patient Name:   Denise Espinoza Date of Exam: 11/23/2023 Medical Rec #:  [id number]    Height:       63.0 in Accession #:    [id number]   Weight:  148.8 lb Date of Birth:  June 12, 1930    BSA:          1.705 m Patient Age:    89 years     BP:           145/82 mmHg Patient Gender: F            HR:           75 bpm. [facility] Procedure: 2D Echo, Cardiac Doppler and Color Doppler (Both Spectral and Color            Flow Doppler were utilized during procedure). Indications:    I26.02 Pulmonary embolus  History:        Patient has prior history of Echocardiogram examinations, most                 recent 03/25/2021. CHF, Abnormal ECG and Pacemaker,                 Arrythmias:Atrial Fibrillation and SVT,                 Signs/Symptoms:Alzheimer's, Altered Mental Status and Syncope;                 Risk Factors:Hypertension and Dyslipidemia. Covid positive.  Sonographer:    Sheralyn Boatman RDCS Referring Phys: [id number] OLADAPO ADEFESO IMPRESSIONS   1. Left ventricular ejection fraction, by estimation, is 70 to 75%. The left ventricle has hyperdynamic function. The left ventricle has no regional wall motion abnormalities. There is mild left ventricular hypertrophy. Left ventricular diastolic parameters are indeterminate.  2. Right ventricular systolic function is normal. The right ventricular size is normal. There is mildly elevated pulmonary artery systolic pressure. The estimated right ventricular systolic pressure is 39.1 mmHg.  3. The mitral valve is normal in structure. No evidence of mitral valve regurgitation. No evidence of mitral stenosis.  4. The aortic valve is tricuspid. Aortic valve regurgitation is mild visually. No aortic stenosis is present. Aortic regurgitation PHT measures 331 msec.  5. The inferior vena cava is dilated in size with >50% respiratory variability, suggesting right atrial pressure of 8 mmHg. Comparison(s): No significant change from prior study. FINDINGS  Left Ventricle: Left ventricular ejection fraction, by estimation, is 70 to 75%. The left ventricle has hyperdynamic function. The left ventricle has no regional wall motion abnormalities. Strain imaging was not performed. The left ventricular internal cavity size was normal in size. There is mild left ventricular hypertrophy. Left ventricular diastolic parameters are indeterminate. Right Ventricle: The right ventricular size is normal. No increase in right ventricular wall thickness. Right ventricular systolic function is normal. There is mildly elevated pulmonary artery systolic pressure. The tricuspid regurgitant velocity is 2.79  m/s, and with an assumed right atrial pressure of 8 mmHg, the estimated right ventricular systolic pressure is 39.1 mmHg. Left Atrium: Left atrial size was normal in size. Right Atrium: Right atrial size was normal in size. Pericardium: There is no evidence of pericardial effusion. Mitral Valve: The mitral valve is normal in structure. No evidence  of mitral valve regurgitation. No evidence of mitral valve stenosis. Tricuspid Valve: The tricuspid valve is normal in structure. Tricuspid valve regurgitation is mild . No evidence of tricuspid stenosis. Aortic Valve: The aortic valve is tricuspid. Aortic valve regurgitation is mild. Aortic regurgitation PHT measures 331 msec. No aortic stenosis is present. Pulmonic Valve: The pulmonic valve was normal in structure. Pulmonic valve regurgitation is trivial. No evidence of pulmonic stenosis. Aorta: The aortic  root and ascending aorta are structurally normal, with no evidence of dilitation. Venous: The inferior vena cava is dilated in size with greater than 50% respiratory variability, suggesting right atrial pressure of 8 mmHg. IAS/Shunts: No atrial level shunt detected by color flow Doppler. Additional Comments: 3D imaging was not performed. A device lead is visualized.  LEFT VENTRICLE PLAX 2D LVIDd:         3.70 cm LVIDs:         2.30 cm LV PW:         1.10 cm LV IVS:        1.30 cm LVOT diam:     2.20 cm LV SV:         71 LV SV Index:   42 LVOT Area:     3.80 cm  LV Volumes (MOD) LV vol d, MOD A2C: 55.4 ml LV vol d, MOD A4C: 57.6 ml LV vol s, MOD A2C: 15.4 ml LV vol s, MOD A4C: 14.1 ml LV SV MOD A2C:     40.0 ml LV SV MOD A4C:     57.6 ml LV SV MOD BP:      43.3 ml RIGHT VENTRICLE             IVC RV S prime:     14.70 cm/s  IVC diam: 2.40 cm TAPSE (M-mode): 1.8 cm LEFT ATRIUM             Index        RIGHT ATRIUM           Index LA diam:        2.40 cm 1.41 cm/m   RA Area:     13.60 cm LA Vol (A2C):   12.8 ml 7.51 ml/m   RA Volume:   30.90 ml  18.12 ml/m LA Vol (A4C):   31.5 ml 18.47 ml/m LA Biplane Vol: 21.2 ml 12.43 ml/m  AORTIC VALVE LVOT Vmax:   102.00 cm/s LVOT Vmean:  67.700 cm/s LVOT VTI:    0.187 m AI PHT:      331 msec  AORTA Ao Root diam: 3.30 cm Ao Asc diam:  3.10 cm MITRAL VALVE                TRICUSPID VALVE MV Area (PHT): 3.85 cm     TR Peak grad:   31.1 mmHg MV Decel Time: 197 msec     TR  Vmax:        279.00 cm/s MV E velocity: 71.00 cm/s MV A velocity: 113.00 cm/s  SHUNTS MV E/A ratio:  0.63         Systemic VTI:  0.19 m                             Systemic Diam: 2.20 cm Vishnu Priya Mallipeddi Electronically signed by Winfield Rast Mallipeddi Signature Date/Time: 11/23/2023/11:23:43 AM    Final    CUP PACEART REMOTE DEVICE CHECK Result Date: 11/22/2023 Scheduled remote reviewed. Normal device function.  Next remote 91 days. ML, CVRS  US Venous Img Lower Bilateral (DVT) Result Date: 11/21/2023 CLINICAL DATA:  141700 Pulmonary embolism (HCC) 141700. EXAM: BILATERAL LOWER EXTREMITY VENOUS DOPPLER ULTRASOUND TECHNIQUE: Gray-scale sonography with graded compression, as well as color Doppler and duplex ultrasound were performed to evaluate the lower extremity deep venous systems from the level of the common femoral vein and including the common femoral, femoral, profunda femoral, popliteal and calf veins  including the posterior tibial, peroneal and gastrocnemius veins when visible. The superficial great saphenous vein was also interrogated. Spectral Doppler was utilized to evaluate flow at rest and with distal augmentation maneuvers in the common femoral, femoral and popliteal veins. COMPARISON:  CT AP, 11/20/2023. FINDINGS: RIGHT LOWER EXTREMITY VENOUS Normal compressibility of the RIGHT common femoral, superficial femoral, and popliteal veins, as well as the visualized calf veins. Visualized portions of profunda femoral vein and great saphenous vein unremarkable. No filling defects to suggest DVT on grayscale or color Doppler imaging. Doppler waveforms show normal direction of venous flow, normal respiratory plasticity and response to augmentation. OTHER No evidence of superficial thrombophlebitis or abnormal fluid collection. Limitations: Patient noncooperative LEFT LOWER EXTREMITY VENOUS Normal compressibility of the LEFT common femoral, superficial femoral, and popliteal veins, as well as the  visualized calf veins. Visualized portions of profunda femoral vein and great saphenous vein unremarkable. No filling defects to suggest DVT on grayscale or color Doppler imaging. Doppler waveforms show normal direction of venous flow, normal respiratory plasticity and response to augmentation. OTHER No evidence of superficial thrombophlebitis or abnormal fluid collection. Limitations: Patient noncooperative IMPRESSION: No evidence of femoropopliteal DVT or superficial thrombophlebitis within either lower extremity. Roanna Banning, MD [facility] Electronically Signed   By: Roanna Banning M.D.   On: 11/21/2023 09:40   CT Angio Chest PE W and/or Wo Contrast Result Date: 11/20/2023 CLINICAL DATA:  Worsening weakness for 2 weeks. Recent cough and acid reflux. PE suspected. Abdominal pain, acute, non localized. EXAM: CT ANGIOGRAPHY CHEST CT ABDOMEN AND PELVIS WITH CONTRAST TECHNIQUE: Multidetector CT imaging of the chest was performed using the standard protocol during bolus administration of intravenous contrast. Multiplanar CT image reconstructions and MIPs were obtained to evaluate the vascular anatomy. Multidetector CT imaging of the abdomen and pelvis was performed using the standard protocol during bolus administration of intravenous contrast. RADIATION DOSE REDUCTION: This exam was performed according to the departmental dose-optimization program which includes automated exposure control, adjustment of the mA and/or kV according to patient size and/or use of iterative reconstruction technique. CONTRAST:  OMNIPAQUE IOHEXOL 350 MG/ML SOLN COMPARISON:  CT abdomen and pelvis 02/13/2017 and radiograph 03/24/2021 FINDINGS: CTA CHEST FINDINGS Cardiovascular: Bilateral lower lobe lobar and segmental pulmonary emboli. No evidence of right heart strain. No pericardial effusion. Aortic atherosclerotic calcification. Mediastinum/Nodes: Trachea is unremarkable.  Patulous esophagus with air-fluid level likely related to reflux. No lymphadenopathy. Lungs/Pleura: Mild emphysema. Diffuse bronchial wall thickening. Biapical pleural-parenchymal scarring. Mucous plugging in the lower lobes. Patchy ground-glass opacities and interlobular septal thickening in the lower lobes. No pleural effusion or pneumothorax. Musculoskeletal: No acute fracture. Review of the MIP images confirms the above findings. CT ABDOMEN and PELVIS FINDINGS Hepatobiliary: Mild intrahepatic biliary dilation. The common bile duct is at the upper limits of normal measuring 9 mm. No radiopaque stone. Gallbladder is unremarkable. Hepatic cysts along the falciform ligament. Pancreas: No acute abnormality. Spleen: Unremarkable. Adrenals/Urinary Tract: Normal adrenal glands. No urinary calculi or hydronephrosis. Irregular bladder wall thickening. Small diverticulum in the bladder anteriorly. Stomach/Bowel: Normal caliber large and small bowel. Moderate colonic stool burden. Stomach is within normal limits. Postoperative change about the rectosigmoid junction. Vascular/Lymphatic: Aortic atherosclerosis. No enlarged abdominal or pelvic lymph nodes. Reproductive: No acute abnormality. Other: Postoperative change along the anterior abdominal wall. No free intraperitoneal fluid or air. Musculoskeletal: No acute fracture. Review of the MIP images confirms the above findings. IMPRESSION: 1. Bilateral lower lobe lobar and segmental pulmonary emboli.  No evidence of right heart strain. 2. Patchy ground-glass opacities and interlobular septal thickening in the lower lobes. Differential considerations are edema or atypical infection. 3. Irregular bladder wall thickening, correlate with urinalysis to exclude cystitis. Critical Value/emergent results were called by telephone at the time of interpretation on 11/20/2023 at 10:04 pm to provider Gloris Manchester , who verbally acknowledged these results. Electronically Signed   By: Minerva Fester M.D.   On: 11/20/2023 22:11   CT ABDOMEN PELVIS W CONTRAST Result Date: 11/20/2023 CLINICAL DATA:  Worsening weakness for 2 weeks. Recent cough and acid reflux. PE suspected. Abdominal pain, acute, non localized. EXAM: CT ANGIOGRAPHY CHEST CT ABDOMEN AND PELVIS WITH CONTRAST TECHNIQUE: Multidetector CT imaging of the chest was performed using the standard protocol during bolus administration of intravenous contrast. Multiplanar CT image reconstructions and MIPs were obtained to evaluate the vascular anatomy. Multidetector CT imaging of the abdomen and pelvis was performed using the standard protocol during bolus administration of intravenous contrast. RADIATION DOSE REDUCTION: This exam was performed according to the departmental dose-optimization program which includes automated exposure control, adjustment of the mA and/or kV according to patient size and/or use of iterative reconstruction technique. CONTRAST:  OMNIPAQUE IOHEXOL 350 MG/ML SOLN COMPARISON:  CT abdomen and pelvis 02/13/2017 and radiograph 03/24/2021 FINDINGS: CTA CHEST FINDINGS Cardiovascular: Bilateral lower lobe lobar and segmental pulmonary emboli. No evidence of right heart strain. No pericardial effusion. Aortic atherosclerotic calcification. Mediastinum/Nodes: Trachea is unremarkable. Patulous esophagus with air-fluid level likely related to reflux. No lymphadenopathy. Lungs/Pleura: Mild emphysema. Diffuse bronchial wall thickening. Biapical pleural-parenchymal scarring. Mucous plugging in the lower lobes. Patchy ground-glass opacities and interlobular septal thickening in the lower lobes. No pleural effusion or pneumothorax. Musculoskeletal: No acute fracture. Review of the MIP images confirms the above findings. CT ABDOMEN and PELVIS FINDINGS Hepatobiliary: Mild intrahepatic biliary dilation. The common bile duct is at the upper limits of normal measuring 9 mm. No radiopaque stone. Gallbladder is unremarkable. Hepatic cysts  along the falciform ligament. Pancreas: No acute abnormality. Spleen: Unremarkable. Adrenals/Urinary Tract: Normal adrenal glands. No urinary calculi or hydronephrosis. Irregular bladder wall thickening. Small diverticulum in the bladder anteriorly. Stomach/Bowel: Normal caliber large and small bowel. Moderate colonic stool burden. Stomach is within normal limits. Postoperative change about the rectosigmoid junction. Vascular/Lymphatic: Aortic atherosclerosis. No enlarged abdominal or pelvic lymph nodes. Reproductive: No acute abnormality. Other: Postoperative change along the anterior abdominal wall. No free intraperitoneal fluid or air. Musculoskeletal: No acute fracture. Review of the MIP images confirms the above findings. IMPRESSION: 1. Bilateral lower lobe lobar and segmental pulmonary emboli. No evidence of right heart strain. 2. Patchy ground-glass opacities and interlobular septal thickening in the lower lobes. Differential considerations are edema or atypical infection. 3. Irregular bladder wall thickening, correlate with urinalysis to exclude cystitis. Critical Value/emergent results were called by telephone at the time of interpretation on 11/20/2023 at 10:04 pm to provider Gloris Manchester , who verbally acknowledged these results. Electronically Signed   By: Minerva Fester M.D.   On: 11/20/2023 22:11    Microbiology: Recent Results (from the past 240 hours)  Resp panel by RT-PCR (RSV, Flu A&B, Covid) Anterior Nasal Swab     Status: Abnormal   Collection Time: 11/20/23  4:47 PM   Specimen: Anterior Nasal Swab  Result Value Ref Range Status   SARS Coronavirus 2 by RT PCR POSITIVE (A) NEGATIVE Final    Comment: (NOTE) SARS-CoV-2 target nucleic acids are DETECTED.  The SARS-CoV-2 RNA is generally detectable in  upper respiratory specimens during the acute phase of infection. Positive results are indicative of the presence of the identified virus, but do not rule out bacterial infection or  co-infection with other pathogens not detected by the test. Clinical correlation with patient history and other diagnostic information is necessary to determine patient infection status. The expected result is Negative.  Fact Sheet for Patients: BloggerCourse.com  Fact Sheet for Healthcare Providers: SeriousBroker.it  This test is not yet approved or cleared by the [place] FDA and  has been authorized for detection and/or diagnosis of SARS-CoV-2 by FDA under an Emergency Use Authorization (EUA).  This EUA will remain in effect (meaning this test can be used) for the duration of  the COVID-19 declaration under Section 564(b)(1) of the A ct, 21 U.S.C. section 360bbb-3(b)(1), unless the authorization is terminated or revoked sooner.     Influenza A by PCR NEGATIVE NEGATIVE Final   Influenza B by PCR NEGATIVE NEGATIVE Final    Comment: (NOTE) The Xpert Xpress SARS-CoV-2/FLU/RSV plus assay is intended as an aid in the diagnosis of influenza from Nasopharyngeal swab specimens and should not be used as a sole basis for treatment. Nasal washings and aspirates are unacceptable for Xpert Xpress SARS-CoV-2/FLU/RSV testing.  Fact Sheet for Patients: BloggerCourse.com  Fact Sheet for Healthcare Providers: SeriousBroker.it  This test is not yet approved or cleared by the [place] FDA and has been authorized for detection and/or diagnosis of SARS-CoV-2 by FDA under an Emergency Use Authorization (EUA). This EUA will remain in effect (meaning this test can be used) for the duration of the COVID-19 declaration under Section 564(b)(1) of the Act, 21 U.S.C. section 360bbb-3(b)(1), unless the authorization is terminated or revoked.     Resp Syncytial Virus by PCR NEGATIVE NEGATIVE Final    Comment: (NOTE) Fact Sheet for Patients: BloggerCourse.com  Fact  Sheet for Healthcare Providers: SeriousBroker.it  This test is not yet approved or cleared by the [place] FDA and has been authorized for detection and/or diagnosis of SARS-CoV-2 by FDA under an Emergency Use Authorization (EUA). This EUA will remain in effect (meaning this test can be used) for the duration of the COVID-19 declaration under Section 564(b)(1) of the Act, 21 U.S.C. section 360bbb-3(b)(1), unless the authorization is terminated or revoked.  Performed at [facility], [address], [place], [place] [address]   Urine Culture     Status: Abnormal   Collection Time: 11/20/23  8:33 PM   Specimen: Urine, Clean Catch  Result Value Ref Range Status   Specimen Description   Final    URINE, CLEAN CATCH Performed at [facility], [address] [place], [place] [address]    Special Requests   Final    NONE Performed at [facility], [address] [address], [place], [place] [address]    Culture MULTIPLE SPECIES PRESENT, SUGGEST RECOLLECTION (A)  Final   Report Status 11/22/2023 FINAL  Final     Labs: Basic Metabolic Panel: Recent Labs  Lab 11/20/23 1800 11/21/23 0427 11/22/23 0312 11/23/23 0442  NA 138 140 140 140  K 3.8 3.3* 3.4* 3.8  CL 104 105 106 106  CO2 26 26 27 27   GLUCOSE 108* 89 88 86  BUN 19 15 20 17   CREATININE 0.83 0.68 0.78 0.70  CALCIUM 9.3 8.9 8.4* 8.5*  MG  --  1.7  --   --   PHOS  --  3.1  --   --    Liver Function Tests: Recent Labs  Lab 11/21/23 0427  AST 16  ALT 11  ALKPHOS 56  BILITOT 0.7  PROT 6.4*  ALBUMIN 3.5   No results for input(s): "LIPASE", "AMYLASE" in the last 168 hours. No results for input(s): "AMMONIA" in the last 168 hours. CBC: Recent Labs  Lab 11/20/23 1905 11/21/23 0427 11/22/23 0312 11/23/23 0442  WBC 6.1 5.1 5.2 4.5  NEUTROABS 4.0  --  2.9 2.5  HGB 11.8* 11.3* 10.5* 10.9*  HCT 35.3* 33.7* 32.7* 32.8*  MCV 93.4 92.1 94.2 94.8  PLT 162 144* 151 152   Cardiac Enzymes: No results  for input(s): "CKTOTAL", "CKMB", "CKMBINDEX", "TROPONINI" in the last 168 hours. BNP: BNP (last 3 results) No results for input(s): "BNP" in the last 8760 hours.  ProBNP (last 3 results) No results for input(s): "PROBNP" in the last 8760 hours.  CBG: No results for input(s): "GLUCAP" in the last 168 hours.  Signed:  Rhetta Mura MD   [facility] 11/23/2023, 11:35 AM

## 2023-11-25 ENCOUNTER — Encounter: Payer: Self-pay | Admitting: Internal Medicine

## 2023-11-26 ENCOUNTER — Encounter: Payer: Medicare Other | Admitting: Internal Medicine

## 2024-01-01 NOTE — Addendum Note (Signed)
 Addended by: Geralyn Flash D on: 01/01/2024 09:57 AM   Modules accepted: Orders

## 2024-01-01 NOTE — Progress Notes (Signed)
 Remote pacemaker transmission.

## 2024-01-09 ENCOUNTER — Encounter: Payer: Self-pay | Admitting: Internal Medicine

## 2024-02-13 ENCOUNTER — Ambulatory Visit: Payer: Self-pay | Attending: Internal Medicine | Admitting: Internal Medicine

## 2024-02-14 ENCOUNTER — Encounter: Payer: Self-pay | Admitting: Internal Medicine

## 2024-02-19 ENCOUNTER — Ambulatory Visit (INDEPENDENT_AMBULATORY_CARE_PROVIDER_SITE_OTHER): Payer: Medicare Other

## 2024-02-19 DIAGNOSIS — I495 Sick sinus syndrome: Secondary | ICD-10-CM | POA: Diagnosis not present

## 2024-02-19 LAB — CUP PACEART REMOTE DEVICE CHECK
Battery Remaining Longevity: 120 mo
Battery Voltage: 3.01 V
Brady Statistic AP VP Percent: 0.52 %
Brady Statistic AP VS Percent: 58.43 %
Brady Statistic AS VP Percent: 0.06 %
Brady Statistic AS VS Percent: 40.99 %
Brady Statistic RA Percent Paced: 58.28 %
Brady Statistic RV Percent Paced: 0.58 %
Date Time Interrogation Session: 20250512212637
Implantable Lead Connection Status: 753985
Implantable Lead Connection Status: 753985
Implantable Lead Implant Date: 20200814
Implantable Lead Implant Date: 20200814
Implantable Lead Location: 753859
Implantable Lead Location: 753860
Implantable Lead Model: 5076
Implantable Lead Model: 5076
Implantable Pulse Generator Implant Date: 20200814
Lead Channel Impedance Value: 285 Ohm
Lead Channel Impedance Value: 399 Ohm
Lead Channel Impedance Value: 437 Ohm
Lead Channel Impedance Value: 456 Ohm
Lead Channel Pacing Threshold Amplitude: 0.5 V
Lead Channel Pacing Threshold Amplitude: 0.75 V
Lead Channel Pacing Threshold Pulse Width: 0.4 ms
Lead Channel Pacing Threshold Pulse Width: 0.4 ms
Lead Channel Sensing Intrinsic Amplitude: 0.25 mV
Lead Channel Sensing Intrinsic Amplitude: 0.25 mV
Lead Channel Sensing Intrinsic Amplitude: 18 mV
Lead Channel Sensing Intrinsic Amplitude: 18 mV
Lead Channel Setting Pacing Amplitude: 2 V
Lead Channel Setting Pacing Amplitude: 2.5 V
Lead Channel Setting Pacing Pulse Width: 0.4 ms
Lead Channel Setting Sensing Sensitivity: 2 mV
Zone Setting Status: 755011
Zone Setting Status: 755011

## 2024-02-25 ENCOUNTER — Ambulatory Visit: Payer: Self-pay | Admitting: Internal Medicine

## 2024-04-07 NOTE — Progress Notes (Signed)
 Remote pacemaker transmission.

## 2024-05-20 ENCOUNTER — Ambulatory Visit (INDEPENDENT_AMBULATORY_CARE_PROVIDER_SITE_OTHER): Payer: Medicare Other

## 2024-05-20 DIAGNOSIS — I495 Sick sinus syndrome: Secondary | ICD-10-CM

## 2024-05-21 LAB — CUP PACEART REMOTE DEVICE CHECK
Battery Remaining Longevity: 117 mo
Battery Voltage: 3.01 V
Brady Statistic AP VP Percent: 1.74 %
Brady Statistic AP VS Percent: 54.63 %
Brady Statistic AS VP Percent: 0.45 %
Brady Statistic AS VS Percent: 43.18 %
Brady Statistic RA Percent Paced: 55.61 %
Brady Statistic RV Percent Paced: 2.19 %
Date Time Interrogation Session: 20250812023508
Implantable Lead Connection Status: 753985
Implantable Lead Connection Status: 753985
Implantable Lead Implant Date: 20200814
Implantable Lead Implant Date: 20200814
Implantable Lead Location: 753859
Implantable Lead Location: 753860
Implantable Lead Model: 5076
Implantable Lead Model: 5076
Implantable Pulse Generator Implant Date: 20200814
Lead Channel Impedance Value: 285 Ohm
Lead Channel Impedance Value: 380 Ohm
Lead Channel Impedance Value: 437 Ohm
Lead Channel Impedance Value: 456 Ohm
Lead Channel Pacing Threshold Amplitude: 0.5 V
Lead Channel Pacing Threshold Amplitude: 0.75 V
Lead Channel Pacing Threshold Pulse Width: 0.4 ms
Lead Channel Pacing Threshold Pulse Width: 0.4 ms
Lead Channel Sensing Intrinsic Amplitude: 0.625 mV
Lead Channel Sensing Intrinsic Amplitude: 0.625 mV
Lead Channel Sensing Intrinsic Amplitude: 16.625 mV
Lead Channel Sensing Intrinsic Amplitude: 16.625 mV
Lead Channel Setting Pacing Amplitude: 2 V
Lead Channel Setting Pacing Amplitude: 2.5 V
Lead Channel Setting Pacing Pulse Width: 0.4 ms
Lead Channel Setting Sensing Sensitivity: 2 mV
Zone Setting Status: 755011
Zone Setting Status: 755011

## 2024-05-22 ENCOUNTER — Ambulatory Visit: Payer: Self-pay | Admitting: Internal Medicine

## 2024-06-24 ENCOUNTER — Telehealth: Payer: Self-pay | Admitting: Internal Medicine

## 2024-06-24 NOTE — Telephone Encounter (Signed)
 Pt called 3xs and letter mailed. No response from pt. Deleting recall.

## 2024-07-03 NOTE — Progress Notes (Signed)
 Remote PPM Transmission

## 2024-08-19 ENCOUNTER — Ambulatory Visit (INDEPENDENT_AMBULATORY_CARE_PROVIDER_SITE_OTHER): Payer: Medicare Other

## 2024-08-19 DIAGNOSIS — I495 Sick sinus syndrome: Secondary | ICD-10-CM | POA: Diagnosis not present

## 2024-08-20 LAB — CUP PACEART REMOTE DEVICE CHECK
Battery Remaining Longevity: 113 mo
Battery Voltage: 3.01 V
Brady Statistic AP VP Percent: 0.17 %
Brady Statistic AP VS Percent: 64.45 %
Brady Statistic AS VP Percent: 0.01 %
Brady Statistic AS VS Percent: 35.37 %
Brady Statistic RA Percent Paced: 64.34 %
Brady Statistic RV Percent Paced: 0.18 %
Date Time Interrogation Session: 20251110234913
Implantable Lead Connection Status: 753985
Implantable Lead Connection Status: 753985
Implantable Lead Implant Date: 20200814
Implantable Lead Implant Date: 20200814
Implantable Lead Location: 753859
Implantable Lead Location: 753860
Implantable Lead Model: 5076
Implantable Lead Model: 5076
Implantable Pulse Generator Implant Date: 20200814
Lead Channel Impedance Value: 285 Ohm
Lead Channel Impedance Value: 380 Ohm
Lead Channel Impedance Value: 380 Ohm
Lead Channel Impedance Value: 456 Ohm
Lead Channel Pacing Threshold Amplitude: 0.5 V
Lead Channel Pacing Threshold Amplitude: 0.75 V
Lead Channel Pacing Threshold Pulse Width: 0.4 ms
Lead Channel Pacing Threshold Pulse Width: 0.4 ms
Lead Channel Sensing Intrinsic Amplitude: 0.75 mV
Lead Channel Sensing Intrinsic Amplitude: 0.75 mV
Lead Channel Sensing Intrinsic Amplitude: 14.125 mV
Lead Channel Sensing Intrinsic Amplitude: 14.125 mV
Lead Channel Setting Pacing Amplitude: 2 V
Lead Channel Setting Pacing Amplitude: 2.5 V
Lead Channel Setting Pacing Pulse Width: 0.4 ms
Lead Channel Setting Sensing Sensitivity: 2 mV
Zone Setting Status: 755011
Zone Setting Status: 755011

## 2024-08-22 NOTE — Progress Notes (Signed)
 Remote PPM Transmission

## 2024-08-24 ENCOUNTER — Ambulatory Visit: Payer: Self-pay | Admitting: Internal Medicine

## 2024-11-18 ENCOUNTER — Ambulatory Visit: Payer: Medicare Other

## 2025-02-17 ENCOUNTER — Ambulatory Visit

## 2025-05-19 ENCOUNTER — Ambulatory Visit

## 2025-08-18 ENCOUNTER — Ambulatory Visit

## 2025-11-17 ENCOUNTER — Ambulatory Visit

## 2026-02-16 ENCOUNTER — Ambulatory Visit
